# Patient Record
Sex: Female | Born: 1960 | Race: White | Hispanic: No | Marital: Married | Smoking: Current every day smoker
Health system: Southern US, Community
[De-identification: ages and names within clinical notes are randomized; demographics above are authoritative.]

## PROBLEM LIST (undated history)

## (undated) DIAGNOSIS — R091 Pleurisy: Secondary | ICD-10-CM

## (undated) DIAGNOSIS — Z87442 Personal history of urinary calculi: Secondary | ICD-10-CM

## (undated) DIAGNOSIS — Z98891 History of uterine scar from previous surgery: Secondary | ICD-10-CM

## (undated) DIAGNOSIS — G43909 Migraine, unspecified, not intractable, without status migrainosus: Secondary | ICD-10-CM

## (undated) DIAGNOSIS — F329 Major depressive disorder, single episode, unspecified: Secondary | ICD-10-CM

## (undated) DIAGNOSIS — F419 Anxiety disorder, unspecified: Secondary | ICD-10-CM

## (undated) DIAGNOSIS — F32A Depression, unspecified: Secondary | ICD-10-CM

## (undated) DIAGNOSIS — I10 Essential (primary) hypertension: Secondary | ICD-10-CM

## (undated) DIAGNOSIS — J40 Bronchitis, not specified as acute or chronic: Secondary | ICD-10-CM

## (undated) DIAGNOSIS — E7800 Pure hypercholesterolemia, unspecified: Secondary | ICD-10-CM

## (undated) DIAGNOSIS — Z9889 Other specified postprocedural states: Secondary | ICD-10-CM

## (undated) DIAGNOSIS — J189 Pneumonia, unspecified organism: Secondary | ICD-10-CM

## (undated) DIAGNOSIS — J309 Allergic rhinitis, unspecified: Secondary | ICD-10-CM

## (undated) DIAGNOSIS — E559 Vitamin D deficiency, unspecified: Secondary | ICD-10-CM

## (undated) DIAGNOSIS — R112 Nausea with vomiting, unspecified: Secondary | ICD-10-CM

## (undated) DIAGNOSIS — K635 Polyp of colon: Secondary | ICD-10-CM

## (undated) DIAGNOSIS — R002 Palpitations: Secondary | ICD-10-CM

## (undated) DIAGNOSIS — K219 Gastro-esophageal reflux disease without esophagitis: Secondary | ICD-10-CM

## (undated) DIAGNOSIS — K5792 Diverticulitis of intestine, part unspecified, without perforation or abscess without bleeding: Secondary | ICD-10-CM

## (undated) DIAGNOSIS — Z951 Presence of aortocoronary bypass graft: Secondary | ICD-10-CM

## (undated) DIAGNOSIS — Z8719 Personal history of other diseases of the digestive system: Secondary | ICD-10-CM

## (undated) HISTORY — DX: Allergic rhinitis, unspecified: J30.9

## (undated) HISTORY — PX: CORONARY ANGIOPLASTY: SHX604

## (undated) HISTORY — PX: ABDOMINAL HYSTERECTOMY: SHX81

## (undated) HISTORY — PX: CORONARY ARTERY BYPASS GRAFT: SHX141

## (undated) HISTORY — DX: Vitamin D deficiency, unspecified: E55.9

## (undated) HISTORY — DX: Polyp of colon: K63.5

## (undated) HISTORY — PX: TUBAL LIGATION: SHX77

## (undated) HISTORY — DX: Anxiety disorder, unspecified: F41.9

## (undated) HISTORY — PX: TONSILLECTOMY AND ADENOIDECTOMY: SUR1326

## (undated) HISTORY — DX: Migraine, unspecified, not intractable, without status migrainosus: G43.909

## (undated) HISTORY — PX: TOTAL ABDOMINAL HYSTERECTOMY: SHX209

## (undated) HISTORY — DX: Pneumonia, unspecified organism: J18.9

## (undated) HISTORY — DX: Pleurisy: R09.1

## (undated) HISTORY — PX: COLONOSCOPY: SHX174

## (undated) HISTORY — PX: BREAST BIOPSY: SHX20

---
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
## 1998-06-06 ENCOUNTER — Emergency Department (HOSPITAL_COMMUNITY): Admission: EM | Admit: 1998-06-06 | Discharge: 1998-06-07 | Payer: Self-pay | Admitting: Emergency Medicine

## 1998-06-06 ENCOUNTER — Encounter: Payer: Self-pay | Admitting: Emergency Medicine

## 1998-09-25 ENCOUNTER — Emergency Department (HOSPITAL_COMMUNITY): Admission: EM | Admit: 1998-09-25 | Discharge: 1998-09-25 | Payer: Self-pay | Admitting: Endocrinology

## 1998-09-28 ENCOUNTER — Emergency Department (HOSPITAL_COMMUNITY): Admission: EM | Admit: 1998-09-28 | Discharge: 1998-09-28 | Payer: Self-pay | Admitting: Emergency Medicine

## 1999-07-15 ENCOUNTER — Other Ambulatory Visit: Admission: RE | Admit: 1999-07-15 | Discharge: 1999-07-15 | Payer: Self-pay | Admitting: Obstetrics and Gynecology

## 1999-07-29 ENCOUNTER — Encounter (INDEPENDENT_AMBULATORY_CARE_PROVIDER_SITE_OTHER): Payer: Self-pay

## 1999-07-29 ENCOUNTER — Other Ambulatory Visit: Admission: RE | Admit: 1999-07-29 | Discharge: 1999-07-29 | Payer: Self-pay | Admitting: Obstetrics and Gynecology

## 1999-08-12 ENCOUNTER — Encounter: Admission: RE | Admit: 1999-08-12 | Discharge: 1999-08-12 | Payer: Self-pay | Admitting: Obstetrics

## 1999-09-14 ENCOUNTER — Encounter: Admission: RE | Admit: 1999-09-14 | Discharge: 1999-09-14 | Payer: Self-pay | Admitting: Obstetrics

## 1999-09-15 ENCOUNTER — Encounter: Payer: Self-pay | Admitting: Obstetrics

## 1999-09-15 ENCOUNTER — Ambulatory Visit (HOSPITAL_COMMUNITY): Admission: RE | Admit: 1999-09-15 | Discharge: 1999-09-15 | Payer: Self-pay | Admitting: Obstetrics

## 1999-09-27 ENCOUNTER — Encounter (INDEPENDENT_AMBULATORY_CARE_PROVIDER_SITE_OTHER): Payer: Self-pay

## 1999-09-27 ENCOUNTER — Ambulatory Visit (HOSPITAL_COMMUNITY): Admission: RE | Admit: 1999-09-27 | Discharge: 1999-09-27 | Payer: Self-pay | Admitting: Obstetrics

## 1999-10-13 ENCOUNTER — Inpatient Hospital Stay (HOSPITAL_COMMUNITY): Admission: AD | Admit: 1999-10-13 | Discharge: 1999-10-13 | Payer: Self-pay | Admitting: Obstetrics

## 1999-10-14 ENCOUNTER — Encounter: Admission: RE | Admit: 1999-10-14 | Discharge: 1999-10-14 | Payer: Self-pay | Admitting: Obstetrics

## 2001-03-24 ENCOUNTER — Emergency Department (HOSPITAL_COMMUNITY): Admission: EM | Admit: 2001-03-24 | Discharge: 2001-03-24 | Payer: Self-pay | Admitting: Emergency Medicine

## 2001-03-27 ENCOUNTER — Emergency Department (HOSPITAL_COMMUNITY): Admission: EM | Admit: 2001-03-27 | Discharge: 2001-03-27 | Payer: Self-pay | Admitting: Emergency Medicine

## 2001-04-05 ENCOUNTER — Encounter: Admission: RE | Admit: 2001-04-05 | Discharge: 2001-05-10 | Payer: Self-pay | Admitting: Family Medicine

## 2001-05-09 ENCOUNTER — Other Ambulatory Visit: Admission: RE | Admit: 2001-05-09 | Discharge: 2001-05-09 | Payer: Self-pay | Admitting: Family Medicine

## 2001-12-03 ENCOUNTER — Emergency Department (HOSPITAL_COMMUNITY): Admission: EM | Admit: 2001-12-03 | Discharge: 2001-12-03 | Payer: Self-pay | Admitting: *Deleted

## 2001-12-21 ENCOUNTER — Emergency Department (HOSPITAL_COMMUNITY): Admission: EM | Admit: 2001-12-21 | Discharge: 2001-12-21 | Payer: Self-pay | Admitting: Emergency Medicine

## 2002-09-24 ENCOUNTER — Other Ambulatory Visit: Admission: RE | Admit: 2002-09-24 | Discharge: 2002-09-24 | Payer: Self-pay | Admitting: Obstetrics and Gynecology

## 2002-10-07 ENCOUNTER — Encounter: Payer: Self-pay | Admitting: Obstetrics and Gynecology

## 2002-10-07 ENCOUNTER — Ambulatory Visit (HOSPITAL_COMMUNITY): Admission: RE | Admit: 2002-10-07 | Discharge: 2002-10-07 | Payer: Self-pay | Admitting: Obstetrics and Gynecology

## 2002-11-25 ENCOUNTER — Ambulatory Visit (HOSPITAL_BASED_OUTPATIENT_CLINIC_OR_DEPARTMENT_OTHER): Admission: RE | Admit: 2002-11-25 | Discharge: 2002-11-25 | Payer: Self-pay | Admitting: Obstetrics and Gynecology

## 2003-09-04 ENCOUNTER — Emergency Department (HOSPITAL_COMMUNITY): Admission: EM | Admit: 2003-09-04 | Discharge: 2003-09-04 | Payer: Self-pay | Admitting: Family Medicine

## 2003-11-16 ENCOUNTER — Emergency Department (HOSPITAL_COMMUNITY): Admission: EM | Admit: 2003-11-16 | Discharge: 2003-11-16 | Payer: Self-pay | Admitting: Internal Medicine

## 2003-12-25 ENCOUNTER — Emergency Department (HOSPITAL_COMMUNITY): Admission: EM | Admit: 2003-12-25 | Discharge: 2003-12-25 | Payer: Self-pay | Admitting: Family Medicine

## 2005-04-11 ENCOUNTER — Emergency Department (HOSPITAL_COMMUNITY): Admission: EM | Admit: 2005-04-11 | Discharge: 2005-04-12 | Payer: Self-pay | Admitting: Emergency Medicine

## 2006-08-30 ENCOUNTER — Ambulatory Visit (HOSPITAL_COMMUNITY): Admission: RE | Admit: 2006-08-30 | Discharge: 2006-08-31 | Payer: Self-pay | Admitting: Obstetrics and Gynecology

## 2006-08-30 ENCOUNTER — Encounter (INDEPENDENT_AMBULATORY_CARE_PROVIDER_SITE_OTHER): Payer: Self-pay | Admitting: Obstetrics and Gynecology

## 2007-01-17 ENCOUNTER — Ambulatory Visit (HOSPITAL_COMMUNITY): Admission: RE | Admit: 2007-01-17 | Discharge: 2007-01-17 | Payer: Self-pay | Admitting: Obstetrics and Gynecology

## 2007-09-25 ENCOUNTER — Emergency Department (HOSPITAL_COMMUNITY): Admission: EM | Admit: 2007-09-25 | Discharge: 2007-09-25 | Payer: Self-pay | Admitting: Emergency Medicine

## 2007-10-29 ENCOUNTER — Ambulatory Visit: Payer: Self-pay | Admitting: Cardiology

## 2010-08-10 NOTE — Assessment & Plan Note (Signed)
[facility]                            CARDIOLOGY OFFICE NOTE   NAME:Regina Perry, Regina Perry                        MRN:          [id number]  DATE:10/29/2007                            DOB:          [birth year]    I was asked by Dr. Iantha Fallen to consult on [name] with chest  discomfort.   [facility] September 25, 2007, with some sharp  stabbing pains in her chest.  She then had trouble breathing and got  very anxious.   She has multiple risk factors for coronary artery disease even though  she is only 50 years of age.  She smokes about a pack of cigarettes a  day and also has a family history with the mother having an MI in her  early 50s.  Her mother was a smoker, but not a heavy smoker.  She did  have COPD, however.   She has been under a lot of stress with her mother dying and then also  her sister dying.  She says every time she tries to stop smoking,  something bad happens to her.   She suffer from headaches and she was about 50 years of age.  She has  had some chest pain and pressure off and on with  stress and tension.   PAST MEDICAL HISTORY:  She has no history of dye reactions.  She is not  allergic to medications.  She does drink alcohol.  She does not list how  much.  She does not use excessive caffeine.   PAST SURGICAL HISTORY:  She had LEA procedure in 2001.   FAMILY HISTORY:  As noted above.   SOCIAL HISTORY:  She is a Sport and exercise psychologist.  She is married, has 2  children.   REVIEW OF SYSTEMS:  She has a history of some anemia, history of an  ulcer, menstrual dysfunction, urinary tract problems, anxiety, and  depression.  Otherwise her history, review of systems are negative other  than HPI.   PHYSICAL EXAMINATION:  GENERAL:  Very pleasant lady in no acute  distress.  She looks pretty tired and stressed.  She is a bit anxious.  VITAL SIGNS:  Her blood pressure is 122/82, her pulse is 64 and regular.  Her EKG is completely normal except for an RSR prime in V1 and V2,  mostly V2.  She is 5 feet and 2-1/2, and weighs 135 35 pounds.  HEENT:  Normocephalic and atraumatic.  PERRLA.  Extraocular movements  intact.  Sclerae are slightly injected.  Facial symmetry is normal.  Carotid upstrokes are equal bilaterally without bruits.  No JVD.  Thyroid is not enlarged.  Trachea is midline.  LUNGS:  Reveal some expiratory and expiratory rhonchi.  HEART:  Reveals normal S1 and S2.  No gallop.  ABDOMEN:  Soft.  Good bowel sounds.  No midline bruit.  There is no  hepatomegaly.  EXTREMITIES:  Without cyanosis, clubbing, or edema.  Pulses are intact.  NEURO:  Intact.   I had a long talk with Ms. Gafford today.  She clearly is at moderate  risk of having an acute coronary syndrome at a premature age.  I have  encouraged her to stop smoking.  We will send her to the No Smoking  Clinic at [facility].  She has tried Chantix in the past, which  she said she could not tolerate.  It made her dizzy and she had  headaches.   In addition, we will obtain an exercise rest/stress Myoview to rule out  obstructive coronary disease.  We will check fasting lipids on that day  as well as a CMP.  She probably could benefit from a statin with a  moderate to high risk category.     Thomas C. Daleen Squibb, MD, [facility]  Electronically Signed    TCW/MedQ  DD: 10/29/2007  DT: 10/30/2007  Job #: [id number]   cc:   Jacquelyne Balint, MD

## 2010-08-13 NOTE — Discharge Summary (Signed)
NAMEOUMOU, SMEAD                 ACCOUNT NO.:  [id number]   MEDICAL RECORD NO.:  [id number]          PATIENT TYPE:  OIB   LOCATION:  9320                          FACILITY:  [facility]   PHYSICIAN:  Lenoard Aden, M.D.DATE OF BIRTH:  [birth year]   DATE OF ADMISSION:  08/30/2006  DATE OF DISCHARGE:  08/31/2006                               DISCHARGE SUMMARY   PREOPERATIVE DIAGNOSIS:  Dysmenorrhea and menorrhagia.   POSTOPERATIVE DIAGNOSIS:  Dysmenorrhea and menorrhagia, plus enterocele.   PROCEDURE:  Total laparoscopic hysterectomy and McCall culdoplasty.   The patient was admitted and underwent uncomplicated procedures as  described above. She tolerated a regular diet well. Hemoglobin and  hematocrit stable. Ambulated without difficulty. Was discharged to home  day 1 with Percocet #30 to take for pain. She is to follow up in 4  weeks. Discharge teaching done.      Lenoard Aden, M.D.  Electronically Signed     RJT/MEDQ  D:  08/31/2006  T:  08/31/2006  Job:  [id number]

## 2010-08-13 NOTE — Op Note (Signed)
NAMEMARGAURITE, SALIDO NO.:  [id number]   MEDICAL RECORD NO.:  [id number]          PATIENT TYPE:  AMB   LOCATION:  SDC                           FACILITY:  [facility]   PHYSICIAN:  Lenoard Aden, M.D.DATE OF BIRTH:  26-Apr-1960   DATE OF PROCEDURE:  08/30/2006  DATE OF DISCHARGE:                               OPERATIVE REPORT   PREOPERATIVE DIAGNOSIS:  Dysmenorrhea, menorrhagia.  History of failed  endometrial ablation, previous C-section x2.   POSTOPERATIVE DIAGNOSIS:  Dysmenorrhea, menorrhagia.  History of failed  endometrial ablation, previous C-section x2.  Plus enterocele.   PROCEDURE:  Diagnostic laparoscopy, total laparoscopic hysterectomy,  McCall culdoplasty done through the laparoscope.   SURGEON:  Lenoard Aden, M.D.   ASSISTANT:  Cordelia Pen A. Rosalio Macadamia, M.D.   ANESTHESIA:  General   ESTIMATED BLOOD LOSS:  100 mL.   COMPLICATIONS:  None.  The patient to recovery in good condition.   DRAINS:  Foley.   COUNTS:  Correct.   SPECIMEN:  Uterus and cervix.   DESCRIPTION OF PROCEDURE:  After being apprised of risks of anesthesia,  infection, bleeding, injury to abdominal organs with need for repair,  delayed versus immediate complications to include bowel and bladder  injury.  The patient brought to the operating room.  She was  administered a general anesthetic without complication.  She is prepped  and draped in sterile fashion.  Foley catheter placed. Rumi retractor  was placed per vagina after putting a stay suture on the cervix and  seated the Rumi in the proper fashion, unable to inflate the internal  Rumi balloon due to the endometrial ablation, occluder balloon is  insufflated without difficulty.  Attention is turned the abdominal  portion procedure whereby an infraumbilical incision made scalpel.  Veress needle placed opening pressure -2 noted. 3 liters CO2 insufflated  without difficulty.  Trocar placed.  Atraumatic trocar entry  visualized.  Pictures taken.  Normal liver, gallbladder bed, appendix not visualized,  normal previously surgically divided tubes, normal ovaries. Anterior  posterior cul-de-sac are clear except for some dense adhesions of the  bladder flap to the anterior cul-de-sac. At this time the 5 mm trocar  sites are placed bilaterally in the lower bilateral lower quadrants the  midaxillary line under direct visualization and transillumination,  avoiding any vascular injury. At this time the round ligament is grasped  and ligated using the gyrus on the left. Same procedure is done on the  right. The tubo-ovarian round ligament complexes are cauterized and  bladder flap was developed sharply. Using the Endo shears and the gyrus.  At this time the uterine vessels were skeletonized bilaterally and  divided using the gyrus. Good blanching of the uterus noted. Anterior  cul-de-sac entry was made using the spatula. Posterior cul-de-sac entry  was made using a spatula as well. Specimen is completely detached and  pulled down into the vagina maintaining insufflation of the vaginal cuff  could be visualized.  Both ovaries appear normal.  Entire procedure both  ureters were visualized and both peristalsing normally through the  course entire procedure.  At this time the vagina is closed side-to-side  using a 0 Vicryl interrupted figure-of-eight suture. McCall culdoplasty  sutures placed after identification of the enterocele placating the  posterior portion of the cuff and the uterosacrals in the midline.  Irrigation was accomplished.  Good hemostasis noted.  All instruments  were removed under direct visualization.  CO2 was released.  Positive  pressure applied. Incision is closed using 0 Vicryl, 4-0 Vicryl and  Dermabond.  The uterus and cervix removed from the vagina.  The patient  tolerates procedure well and is transferred to recovery in good  condition.      Lenoard Aden, M.D.  Electronically  Signed     RJT/MEDQ  D:  08/30/2006  T:  08/30/2006  Job:  [id number]

## 2010-08-13 NOTE — Op Note (Signed)
[facility]  Patient:    Regina Perry, MARXEN                        MRN: [id number] Proc. Date: 09/27/99 Adm. Date:  [id number] Attending:  Tammi Sou                           Operative Report  PREOPERATIVE DIAGNOSIS:       CIN-3.  POSTOPERATIVE DIAGNOSIS:      CIN-3.  OPERATION:                    LEEP.  SURGEON:                      Charles A. Clearance Coots, M.D.  ASSISTANT:  ANESTHESIA:                   MAC with paracervical block.  ESTIMATED BLOOD LOSS:         Negligible.  COMPLICATIONS:                None.  SPECIMEN:                     Conization of cervix.  DESCRIPTION OF PROCEDURE:     The patient was brought to the operating room and  after satisfactory IV sedation in the dorsal lithotomy position with the legs in Allen stirrups, the vagina was prepped and draped in the usual sterile fashion. A sterile LEEP speculum was placed in the vaginal vault and the cervix was isolated. Paracervical block of 1% Xylocaine with 1:100,000 solution of epinephrine was injected in the cervical stroma throughout.  A 20 x 12 mm loupe electrode was then used to obtain a conization of the cervix with the wattage of 75 watts cutting nd 50 watts coag.  The cervical conization bed was then cauterized with a balltip electrode and the edges were feathered with the balltip electrode to prevent cervical stenosis.  The specimen obtained was submitted to pathology for evaluation.  Monsels solution and Aminocerv cream was then applied to the conization bed for hemostasis and to promote healing respectively.  There was no active bleeding noted at the conclusion of the procedure.  All instruments were  retired.  The patient tolerated the procedure well and was transported to the recovery room in satisfactory condition. DD:  09/27/99 TD:  09/27/99 Job: [id number]

## 2010-08-13 NOTE — Op Note (Signed)
   NAME:  Regina Perry, Regina Perry                           ACCOUNT NO.:  [id number]   MEDICAL RECORD NO.:  [id number]                   PATIENT TYPE:  AMB   LOCATION:  NESC                                 FACILITY:  [facility]   PHYSICIAN:  Cynthia P. Romine, M.D.             DATE OF BIRTH:  [birth year]   DATE OF PROCEDURE:  11/25/2002  DATE OF DISCHARGE:                                 OPERATIVE REPORT   PREOPERATIVE DIAGNOSIS:  Menorrhagia.   POSTOPERATIVE DIAGNOSIS:  Menorrhagia.   PROCEDURE:  Endometrial ablation using hydrofirm ablation.   SURGEON:  Cynthia P. Romine, M.D.   ANESTHESIA:  General by LMA.   ESTIMATED BLOOD LOSS:  Minimal.   COMPLICATIONS:  None.   DESCRIPTION OF PROCEDURE:  The patient was taken to the operating room and  after the induction of adequate general anesthesia was placed in the dorsal  lithotomy position and prepped and draped in the usual fashion. The bladder  was drained with a red rubber catheter. The cervix was grasped on its  anterior lip with a single tooth tenaculum. The cervix was dilated to a #25  Shawnie Pons, the ablation hysteroscope was introduced into the endometrial cavity  and its proper placement noted. Photographic documentation was taken.  Endometrial ablation was then carried out according to the [facility]  specifications without difficulty. Upon completion of the ablation,  photographic documentation was taken and the scope was withdrawn. The  tenaculum was removed from the cervix, Monsel solution was applied to  control bleeding from the tenaculum site and the procedure was terminated.  The patient tolerated the procedure well and went in satisfactory condition  to post anesthesia recovery.                                               Cynthia P. Romine, M.D.    CPR/MEDQ  D:  11/25/2002  T:  11/25/2002  Job:  [id number]

## 2010-12-23 LAB — CBC
HCT: 45.2
Hemoglobin: 15.4 — ABNORMAL HIGH
MCHC: 34
MCV: 91.4
Platelets: 213
RBC: 4.95
RDW: 13.3
WBC: 12 — ABNORMAL HIGH

## 2010-12-23 LAB — DIFFERENTIAL
Basophils Absolute: 0.1
Basophils Relative: 1
Eosinophils Absolute: 0.1
Eosinophils Relative: 1
Lymphocytes Relative: 20
Lymphs Abs: 2.4
Monocytes Absolute: 0.5
Monocytes Relative: 4
Neutro Abs: 8.8 — ABNORMAL HIGH
Neutrophils Relative %: 74

## 2010-12-23 LAB — COMPREHENSIVE METABOLIC PANEL
ALT: 15
AST: 17
Albumin: 4.1
Alkaline Phosphatase: 61
BUN: 11
CO2: 23
Calcium: 9.3
Chloride: 105
Creatinine, Ser: 0.78
GFR calc Af Amer: >60
GFR calc non Af Amer: >60
Glucose, Bld: 101 — ABNORMAL HIGH
Potassium: 4
Sodium: 138
Total Bilirubin: 1.1
Total Protein: 7.1

## 2010-12-23 LAB — POCT I-STAT, CHEM 8
BUN: 13
Calcium, Ion: 1.11 — ABNORMAL LOW
Chloride: 108
Creatinine, Ser: 1
Glucose, Bld: 99
HCT: 48 — ABNORMAL HIGH
Hemoglobin: 16.3 — ABNORMAL HIGH
Potassium: 4.5
Sodium: 139
TCO2: 24

## 2010-12-23 LAB — URINALYSIS, ROUTINE W REFLEX MICROSCOPIC
Bilirubin Urine: NEGATIVE
Glucose, UA: NEGATIVE
Hgb urine dipstick: NEGATIVE
Ketones, ur: NEGATIVE
Nitrite: NEGATIVE
Protein, ur: NEGATIVE
Specific Gravity, Urine: 1.007
Urobilinogen, UA: 0.2
pH: 7.5

## 2010-12-23 LAB — POCT CARDIAC MARKERS
CKMB, poc: <1 — ABNORMAL LOW
CKMB, poc: <1 — ABNORMAL LOW
Myoglobin, poc: 37.1
Myoglobin, poc: 45.5
Operator id: 146091
Operator id: 257131
Troponin i, poc: <0.05
Troponin i, poc: <0.05

## 2011-01-05 ENCOUNTER — Other Ambulatory Visit (HOSPITAL_COMMUNITY): Payer: Self-pay | Admitting: Family Medicine

## 2011-01-05 DIAGNOSIS — Z1231 Encounter for screening mammogram for malignant neoplasm of breast: Secondary | ICD-10-CM

## 2011-01-13 LAB — BASIC METABOLIC PANEL
BUN: 12
CO2: 20
Calcium: 9.3
Chloride: 108
Creatinine, Ser: 0.78
GFR calc Af Amer: >60
GFR calc non Af Amer: >60
Glucose, Bld: 87
Potassium: 3.5
Sodium: 140

## 2011-01-13 LAB — CBC
HCT: 33.4 — ABNORMAL LOW
HCT: 42
Hemoglobin: 11.7 — ABNORMAL LOW
Hemoglobin: 14.2
MCHC: 33.7
MCHC: 35.1
MCV: 91.7
MCV: 92.2
Platelets: 180
Platelets: 220
RBC: 3.62 — ABNORMAL LOW
RBC: 4.59
RDW: 12.9
RDW: 13.1
WBC: 11.6 — ABNORMAL HIGH
WBC: 13.8 — ABNORMAL HIGH

## 2011-01-13 LAB — SAMPLE TO BLOOD BANK

## 2011-01-13 LAB — HCG, SERUM, QUALITATIVE: Preg, Serum: NEGATIVE

## 2011-01-18 ENCOUNTER — Ambulatory Visit (HOSPITAL_COMMUNITY)
Admission: RE | Admit: 2011-01-18 | Discharge: 2011-01-18 | Disposition: A | Discharge status: Home / Self Care | Payer: 59 | Source: Ambulatory Visit | Attending: Family Medicine | Admitting: Family Medicine

## 2011-01-18 DIAGNOSIS — Z1231 Encounter for screening mammogram for malignant neoplasm of breast: Secondary | ICD-10-CM | POA: Insufficient documentation

## 2012-02-07 ENCOUNTER — Ambulatory Visit (HOSPITAL_COMMUNITY)
Admission: RE | Admit: 2012-02-07 | Discharge: 2012-02-07 | Disposition: A | Discharge status: Home / Self Care | Payer: 59 | Source: Ambulatory Visit | Attending: Family Medicine | Admitting: Family Medicine

## 2012-02-07 ENCOUNTER — Other Ambulatory Visit (HOSPITAL_COMMUNITY): Payer: Self-pay | Admitting: Family Medicine

## 2012-02-07 DIAGNOSIS — R05 Cough: Secondary | ICD-10-CM | POA: Insufficient documentation

## 2012-02-07 DIAGNOSIS — J189 Pneumonia, unspecified organism: Secondary | ICD-10-CM

## 2012-02-07 DIAGNOSIS — R059 Cough, unspecified: Secondary | ICD-10-CM | POA: Insufficient documentation

## 2012-02-07 DIAGNOSIS — J3489 Other specified disorders of nose and nasal sinuses: Secondary | ICD-10-CM | POA: Insufficient documentation

## 2012-04-03 ENCOUNTER — Ambulatory Visit (HOSPITAL_COMMUNITY)
Admission: RE | Admit: 2012-04-03 | Discharge: 2012-04-03 | Disposition: A | Discharge status: Home / Self Care | Payer: 59 | Source: Ambulatory Visit | Attending: Family Medicine | Admitting: Family Medicine

## 2012-04-03 ENCOUNTER — Other Ambulatory Visit (HOSPITAL_COMMUNITY): Payer: Self-pay | Admitting: Family Medicine

## 2012-04-03 DIAGNOSIS — J40 Bronchitis, not specified as acute or chronic: Secondary | ICD-10-CM

## 2012-04-09 ENCOUNTER — Other Ambulatory Visit: Payer: Self-pay

## 2012-04-09 ENCOUNTER — Encounter (HOSPITAL_COMMUNITY): Payer: Self-pay | Admitting: Emergency Medicine

## 2012-04-09 ENCOUNTER — Observation Stay (HOSPITAL_COMMUNITY)
Admission: EM | Admit: 2012-04-09 | Discharge: 2012-04-10 | Disposition: A | Discharge status: Home / Self Care | Payer: 59 | Attending: Internal Medicine | Admitting: Internal Medicine

## 2012-04-09 ENCOUNTER — Emergency Department (HOSPITAL_COMMUNITY): Payer: 59

## 2012-04-09 DIAGNOSIS — Z23 Encounter for immunization: Secondary | ICD-10-CM | POA: Insufficient documentation

## 2012-04-09 DIAGNOSIS — I10 Essential (primary) hypertension: Secondary | ICD-10-CM | POA: Diagnosis present

## 2012-04-09 DIAGNOSIS — R079 Chest pain, unspecified: Principal | ICD-10-CM | POA: Diagnosis present

## 2012-04-09 DIAGNOSIS — Z720 Tobacco use: Secondary | ICD-10-CM | POA: Diagnosis present

## 2012-04-09 DIAGNOSIS — F17200 Nicotine dependence, unspecified, uncomplicated: Secondary | ICD-10-CM | POA: Insufficient documentation

## 2012-04-09 DIAGNOSIS — E785 Hyperlipidemia, unspecified: Secondary | ICD-10-CM | POA: Diagnosis present

## 2012-04-09 HISTORY — DX: Essential (primary) hypertension: I10

## 2012-04-09 HISTORY — DX: Bronchitis, not specified as acute or chronic: J40

## 2012-04-09 HISTORY — DX: Pure hypercholesterolemia, unspecified: E78.00

## 2012-04-09 HISTORY — DX: History of uterine scar from previous surgery: Z98.891

## 2012-04-09 LAB — CBC WITH DIFFERENTIAL/PLATELET
Basophils Absolute: 0 10*3/uL (ref 0.0–0.1)
Basophils Relative: 0 % (ref 0–1)
Eosinophils Absolute: 0.1 10*3/uL (ref 0.0–0.7)
Eosinophils Relative: 1 % (ref 0–5)
HCT: 40.5 % (ref 36.0–46.0)
Hemoglobin: 13.4 g/dL (ref 12.0–15.0)
Lymphocytes Relative: 30 % (ref 12–46)
Lymphs Abs: 2.5 10*3/uL (ref 0.7–4.0)
MCH: 29.3 pg (ref 26.0–34.0)
MCHC: 33.1 g/dL (ref 30.0–36.0)
MCV: 88.6 fL (ref 78.0–100.0)
Monocytes Absolute: 0.5 10*3/uL (ref 0.1–1.0)
Monocytes Relative: 6 % (ref 3–12)
Neutro Abs: 5.2 10*3/uL (ref 1.7–7.7)
Neutrophils Relative %: 63 % (ref 43–77)
Platelets: 191 10*3/uL (ref 150–400)
RBC: 4.57 MIL/uL (ref 3.87–5.11)
RDW: 12.8 % (ref 11.5–15.5)
WBC: 8.3 10*3/uL (ref 4.0–10.5)

## 2012-04-09 LAB — BASIC METABOLIC PANEL
BUN: 17 mg/dL (ref 6–23)
CO2: 23 mEq/L (ref 19–32)
Calcium: 9 mg/dL (ref 8.4–10.5)
Chloride: 106 mEq/L (ref 96–112)
Creatinine, Ser: 0.67 mg/dL (ref 0.50–1.10)
GFR calc Af Amer: >90 mL/min (ref >90)
GFR calc non Af Amer: >90 mL/min (ref >90)
Glucose, Bld: 90 mg/dL (ref 70–99)
Potassium: 3.9 mEq/L (ref 3.5–5.1)
Sodium: 142 mEq/L (ref 135–145)

## 2012-04-09 LAB — TROPONIN I
Troponin I: <0.3 ng/mL (ref <0.30)
Troponin I: <0.3 ng/mL (ref <0.30)

## 2012-04-09 MED ORDER — MINOCYCLINE HCL 50 MG PO CAPS
50.0000 mg | ORAL_CAPSULE | Freq: Two times a day (BID) | ORAL | Status: DC
  Administered 2012-04-09 – 2012-04-10 (×2): 50 mg via ORAL
  Filled 2012-04-09 (×3): qty 1

## 2012-04-09 MED ORDER — IPRATROPIUM BROMIDE 0.02 % IN SOLN
0.5000 mg | Freq: Once | RESPIRATORY_TRACT | Status: AC
  Administered 2012-04-09: 0.5 mg via RESPIRATORY_TRACT
  Filled 2012-04-09: qty 2.5

## 2012-04-09 MED ORDER — NITROGLYCERIN 0.4 MG SL SUBL: 0.4000 mg | SUBLINGUAL_TABLET | SUBLINGUAL | Status: DC | PRN

## 2012-04-09 MED ORDER — ALBUTEROL SULFATE HFA 108 (90 BASE) MCG/ACT IN AERS: 1.0000 | INHALATION_SPRAY | Freq: Four times a day (QID) | RESPIRATORY_TRACT | Status: DC | PRN

## 2012-04-09 MED ORDER — PNEUMOCOCCAL VAC POLYVALENT 25 MCG/0.5ML IJ INJ
0.5000 mL | INJECTION | INTRAMUSCULAR | Status: AC
  Administered 2012-04-10: 0.5 mL via INTRAMUSCULAR
  Filled 2012-04-09: qty 0.5

## 2012-04-09 MED ORDER — SODIUM CHLORIDE 0.9 % IJ SOLN: 3.0000 mL | Freq: Two times a day (BID) | INTRAMUSCULAR | Status: DC

## 2012-04-09 MED ORDER — SODIUM CHLORIDE 0.9 % IJ SOLN: 3.0000 mL | INTRAMUSCULAR | Status: DC | PRN

## 2012-04-09 MED ORDER — MORPHINE SULFATE 2 MG/ML IJ SOLN: 2.0000 mg | INTRAMUSCULAR | Status: DC | PRN

## 2012-04-09 MED ORDER — ASPIRIN 325 MG PO TABS
325.0000 mg | ORAL_TABLET | Freq: Every day | ORAL | Status: DC
  Administered 2012-04-09: 325 mg via ORAL
  Filled 2012-04-09: qty 1

## 2012-04-09 MED ORDER — ALBUTEROL SULFATE (5 MG/ML) 0.5% IN NEBU
5.0000 mg | INHALATION_SOLUTION | Freq: Once | RESPIRATORY_TRACT | Status: AC
  Administered 2012-04-09: 5 mg via RESPIRATORY_TRACT
  Filled 2012-04-09: qty 1

## 2012-04-09 MED ORDER — METOPROLOL TARTRATE 25 MG PO TABS
25.0000 mg | ORAL_TABLET | Freq: Two times a day (BID) | ORAL | Status: DC
  Administered 2012-04-09 – 2012-04-10 (×2): 25 mg via ORAL
  Filled 2012-04-09 (×3): qty 1

## 2012-04-09 MED ORDER — MORPHINE SULFATE 4 MG/ML IJ SOLN
4.0000 mg | Freq: Once | INTRAMUSCULAR | Status: AC
  Administered 2012-04-09: 4 mg via INTRAVENOUS
  Filled 2012-04-09: qty 1

## 2012-04-09 MED ORDER — SIMVASTATIN 10 MG PO TABS
10.0000 mg | ORAL_TABLET | Freq: Every day | ORAL | Status: DC
  Filled 2012-04-09: qty 1

## 2012-04-09 MED ORDER — SUMATRIPTAN SUCCINATE 100 MG PO TABS
100.0000 mg | ORAL_TABLET | ORAL | Status: DC | PRN
  Administered 2012-04-10 (×2): 100 mg via ORAL
  Filled 2012-04-09 (×3): qty 1

## 2012-04-09 MED ORDER — ENOXAPARIN SODIUM 40 MG/0.4ML ~~LOC~~ SOLN
40.0000 mg | Freq: Every day | SUBCUTANEOUS | Status: DC
  Administered 2012-04-09: 40 mg via SUBCUTANEOUS
  Filled 2012-04-09 (×2): qty 0.4

## 2012-04-09 MED ORDER — SODIUM CHLORIDE 0.9 % IV SOLN: 250.0000 mL | INTRAVENOUS | Status: DC | PRN

## 2012-04-09 MED ORDER — SODIUM CHLORIDE 0.9 % IJ SOLN
3.0000 mL | Freq: Two times a day (BID) | INTRAMUSCULAR | Status: DC
  Administered 2012-04-09: 3 mL via INTRAVENOUS

## 2012-04-09 MED ORDER — PANTOPRAZOLE SODIUM 40 MG PO TBEC
40.0000 mg | DELAYED_RELEASE_TABLET | Freq: Every day | ORAL | Status: DC
  Administered 2012-04-10: 40 mg via ORAL

## 2012-04-09 MED ORDER — ASPIRIN EC 325 MG PO TBEC
325.0000 mg | DELAYED_RELEASE_TABLET | Freq: Every day | ORAL | Status: DC
  Administered 2012-04-10: 325 mg via ORAL
  Filled 2012-04-09: qty 1

## 2012-04-09 NOTE — ED Notes (Signed)
Per EMS - pt c/o CP, NSR on monitor. EMS started a 20G in hand. Pt took 324 of ASA prior to arrival. EMS gave 2 Nitro after nitro pt rates pain at 2/10 and locates pain at mid-epigatric area now. BP 166/90 HR 84. Pt has congested cough.

## 2012-04-09 NOTE — ED Provider Notes (Signed)
History     CSN: [id number]  Arrival date & time 04/09/12  1345   First MD Initiated Contact with Patient 04/09/12 1356      Chief Complaint  Patient presents with  . Chest Pain    (Consider location/radiation/quality/duration/timing/severity/associated sxs/prior treatment) HPI Pt reports a couple of weeks of intermittent mild aching chest pain and cough she attributed to bronchitis. She has been taking antibiotics at home for same. She had an episode this morning of severe midsternal chest pressure, associated with SOB and nausea, improved after taking ASA and resting. Symptoms returned while she was at work and EMS was called. She was given NTG and symptoms improved again. She has mild pain now. No prior history of same. No fever. She has HTN and she is a smoker. No PE risk factors.   Past Medical History  Diagnosis Date  . Hypertension   . Bronchitis   . Hypercholesteremia   . H/O cesarean section     Past Surgical History  Procedure Date  . Abdominal hysterectomy     partial    No family history on file.  History  Substance Use Topics  . Smoking status: Current Every Day Smoker -- 0.5 packs/day    Types: Cigarettes  . Smokeless tobacco: Not on file  . Alcohol Use: No    OB History    Grav Para Term Preterm Abortions TAB SAB Ect Mult Living                  Review of Systems All other systems reviewed and are negative except as noted in HPI.   Allergies  Review of patient's allergies indicates no known allergies.  Home Medications   Current Outpatient Rx  Name  Route  Sig  Dispense  Refill  . ACYCLOVIR 800 MG PO TABS   Oral   Take 800 mg by mouth 3 (three) times daily. Started 04-03-12 for viral bronchitis         . ALBUTEROL SULFATE HFA 108 (90 BASE) MCG/ACT IN AERS   Inhalation   Inhale 1 puff into the lungs every 6 (six) hours as needed. For shortness of breath         . ASPIRIN 325 MG PO TABS   Oral   Take 325 mg by mouth once.           Marlin Canary HEADACHE PO   Oral   Take 1-2 packets by mouth daily as needed. For headaches         . LOVASTATIN 20 MG PO TABS   Oral   Take 20 mg by mouth at bedtime.         Marland Kitchen MINOCYCLINE HCL 50 MG PO CAPS   Oral   Take 50 mg by mouth 2 (two) times daily. Started on 04-04-12 for bronchitis         . OMEPRAZOLE 20 MG PO CPDR   Oral   Take 20 mg by mouth daily.         . SUMATRIPTAN SUCCINATE 100 MG PO TABS   Oral   Take 100 mg by mouth every 2 (two) hours as needed. For migraines           BP 161/88  Pulse 77  Temp 97.8 F (36.6 C) (Oral)  Resp 18  SpO2 98%  Physical Exam  Nursing note and vitals reviewed. Constitutional: She is oriented to person, place, and time. She appears well-developed and well-nourished.  HENT:  Head: Normocephalic and  atraumatic.  Eyes: EOM are normal. Pupils are equal, round, and reactive to light.  Neck: Normal range of motion. Neck supple.  Cardiovascular: Normal rate, normal heart sounds and intact distal pulses.   Pulmonary/Chest: Effort normal and breath sounds normal.  Abdominal: Bowel sounds are normal. She exhibits no distension. There is no tenderness.  Musculoskeletal: Normal range of motion. She exhibits no edema and no tenderness.  Neurological: She is alert and oriented to person, place, and time. She has normal strength. No cranial nerve deficit or sensory deficit.  Skin: Skin is warm and dry. No rash noted.  Psychiatric: She has a normal mood and affect.    ED Course  Procedures (including critical care time)   Labs Reviewed  CBC WITH DIFFERENTIAL  BASIC METABOLIC PANEL  TROPONIN I   No results found.   No diagnosis found.    MDM   Date: 04/09/2012  Rate: 71  Rhythm: normal sinus rhythm  QRS Axis: normal  Intervals: normal  ST/T Wave abnormalities: normal  Conduction Disutrbances:none  Narrative Interpretation:   Old EKG Reviewed: none available  Care signed out to Dr. Effie Shy at the change of shift  pending lab results.         Annalynne Ibanez B. Bernette Mayers, MD 04/10/12 [phone]

## 2012-04-09 NOTE — ED Provider Notes (Signed)
Regina Perry is a 52 y.o. female who is here for evaluation of chest pain. She was seen initially by Dr. Gabriel Rung to evaluate her after her troponin returned. She was seen at 18:55. At this point, her chest pain is coming back. She feels like both the nitroglycerin, and morphine helped her pain. Now, the pain is 2/10, and waxing and waning. There are no associated symptoms. She's been treated for bronchitis, with an inhaler after treatment for pneumonia. She's not had her albuterol inhaler since being here.   Exam- alert, calm, appears older than stated age. Heart regular rate and rhythm. The murmur. Lungs with decreased air movement bilaterally, and scattered wheezes. Chest nontender to palpation. Abdomen soft, nontender. Legs nontender.  Nursing notes, applicable records and vitals reviewed.  Radiologic Images/Reports reviewed.   Medical decision making-TIMI 1. Cardiac risk profile. She has hypertension, hyperlipidemia, and a family history positive for MI in young females. She has previously had a cardiac stress test in 2009. The results are not available. Patient recalls that they were normal. Stress test was done by Dr. wall after he saw her in the office. She has not still see Dr. Daleen Squibb.   Plan: Admit to the hospital for rule out MI, and risk stratification cardiac procedure.    Flint Melter, MD 04/09/12 [phone]

## 2012-04-09 NOTE — ED Notes (Signed)
Pt reports she had been having this chest pressure/pain on and off for the past couple weeks and thought it was because she had pneumonia and been coughing a lot but felt today it was worse than normal.

## 2012-04-09 NOTE — H&P (Addendum)
[facility] History and Physical  Regina Perry:[id number] DOB: 1961-03-05 DOA: 04/09/2012  Referring physician: ED PCP: Aida Puffer, MD   Chief Complaint: Chest pain since 1 day  HPI:  52 year old female with history of hypertension, hyperlipidemia, heavy smoking, family history of MI who was in her usual state of health when she developed acute onset of substernal chest pain while sitting at home this morning. Patient informs the chest and to the crushing in nature 10 out of 10 in intensity lasted for almost 10 minutes. She took a dose of aspirin after which her pain mildly improved but recurred about an hour later. The pain radiated to her jaws. He called EMS and was brought to the ED. In the ED initial EKG and troponin was unremarkable. She was given a dose of morphine with her pain reduced to 4/10. She denies any shortness of breath , , palpitations, headache, blurry vision, fever, chills, abdominal pain, bowel or urinary symptoms. Denies any dizziness. She did have some nausea with the pain but denied any vomiting. She informs having bronchitis like symptoms one week back and was given a course of antibiotic by her PCP.   patient denies having similar chest pains in the past however was evaluated by [facility] cardiology in 2009  were increased risk factors for premature CAD with strong family history of her mother having MI in her late 18s. A stress test done as per the patient was negative at that time. Hospitalist called for admission to medical floor.  Review of Systems:  Constitutional: Denies fever, chills, diaphoresis, appetite change and fatigue.  HEENT: Denies photophobia, eye pain, redness, hearing loss, ear pain, congestion, sore throat, rhinorrhea, sneezing, mouth sores, trouble swallowing, neck pain, neck stiffness and tinnitus.   Respiratory: Denies SOB, DOE, cough, chest tightness,  and wheezing.   Cardiovascular: positive for substernal chest pain, denies palpitations  and leg swelling.  Gastrointestinal: nausea present, denies  vomiting, abdominal pain, diarrhea, constipation, blood in stool and abdominal distention.  Genitourinary: Denies dysuria, urgency, frequency, hematuria, flank pain and difficulty urinating.  Musculoskeletal: Denies myalgias, back pain, joint swelling, arthralgias and gait problem.  Skin: Denies pallor, rash and wound.  Neurological: Denies dizziness, seizures, syncope, weakness, light-headedness, numbness and headaches.  Hematological: Denies adenopathy. Easy bruising, personal or family bleeding history  Psychiatric/Behavioral: Denies suicidal ideation, mood changes, confusion, nervousness, sleep disturbance and agitation   Past Medical History  Diagnosis Date  . Hypertension   . Bronchitis   . Hypercholesteremia   . H/O cesarean section    Past Surgical History  Procedure Date  . Abdominal hysterectomy     partial   Social History:  reports that she has been smoking Cigarettes.  She has been smoking about .5 packs per day. She does not have any smokeless tobacco history on file. She reports that she does not drink alcohol or use illicit drugs.  No Known Allergies  No family history on file.  Prior to Admission medications   Medication Sig Start Date End Date Taking? Authorizing Provider  acyclovir (ZOVIRAX) 800 MG tablet Take 800 mg by mouth 3 (three) times daily. Started 04-03-12 for viral bronchitis   Yes Historical Provider, MD  albuterol (PROVENTIL HFA;VENTOLIN HFA) 108 (90 BASE) MCG/ACT inhaler Inhale 1 puff into the lungs every 6 (six) hours as needed. For shortness of breath   Yes Historical Provider, MD  aspirin 325 MG tablet Take 325 mg by mouth once.   Yes Historical Provider, MD  Aspirin-Acetaminophen-Caffeine (GOODY HEADACHE  PO) Take 1-2 packets by mouth daily as needed. For headaches   Yes Historical Provider, MD  lovastatin (MEVACOR) 20 MG tablet Take 20 mg by mouth at bedtime.   Yes Historical Provider, MD    minocycline (MINOCIN,DYNACIN) 50 MG capsule Take 50 mg by mouth 2 (two) times daily. Started on 04-04-12 for bronchitis   Yes Historical Provider, MD  omeprazole (PRILOSEC) 20 MG capsule Take 20 mg by mouth daily.   Yes Historical Provider, MD  SUMAtriptan (IMITREX) 100 MG tablet Take 100 mg by mouth every 2 (two) hours as needed. For migraines   Yes Historical Provider, MD    Physical Exam:  Filed Vitals:   04/09/12 1403 04/09/12 1415 04/09/12 1906 04/09/12 1915  BP:  161/88 151/84 149/66  Pulse:  77 67 64  Temp: 97.8 F (36.6 C)     TempSrc:      Resp:  18 20   SpO2:  98% 100% 100%    Constitutional: Vital signs reviewed.  Patient is a well-developed and well-nourished in no acute distress and cooperative with exam. Alert and oriented x3.  Head: Normocephalic and atraumatic Ear: TM normal bilaterally Mouth: no erythema or exudates, MMM Eyes: PERRL, EOMI, conjunctivae normal, No scleral icterus.  Neck: Supple, Trachea midline normal ROM, No JVD, mass, thyromegaly, or carotid bruit present.  Cardiovascular: RRR, S1 normal, S2 normal, no MRG, pulses symmetric and intact bilaterally. Chest pain not reproducible Pulmonary/Chest: CTAB, no wheezes, rales, or rhonchi Abdominal: Soft. Non-tender, non-distended, bowel sounds are normal, no masses, organomegaly, or guarding present.  GU: no CVA tenderness Musculoskeletal: No joint deformities, erythema, or stiffness, ROM full and no nontender Ext: no edema and no cyanosis, pulses palpable bilaterally (DP and PT) Hematology: no cervical, inginal, or axillary adenopathy.  Neurological: A&O x3, Strenght is normal and symmetric bilaterally, cranial nerve II-XII are grossly intact, no focal motor deficit, sensory intact to light touch bilaterally.  Skin: Warm, dry and intact. No rash, cyanosis, or clubbing.  Psychiatric: Normal mood and affect. speech and behavior is normal. Judgment and thought content normal. Cognition and memory are normal.    Labs on Admission:  Basic Metabolic Panel:  Lab 04/09/12 9562  NA 142  K 3.9  CL 106  CO2 23  GLUCOSE 90  BUN 17  CREATININE 0.67  CALCIUM 9.0  MG --  PHOS --   Liver Function Tests: No results found for this basename: AST:5,ALT:5,ALKPHOS:5,BILITOT:5,PROT:5,ALBUMIN:5 in the last 168 hours No results found for this basename: LIPASE:5,AMYLASE:5 in the last 168 hours No results found for this basename: AMMONIA:5 in the last 168 hours CBC:  Lab 04/09/12 1451  WBC 8.3  NEUTROABS 5.2  HGB 13.4  HCT 40.5  MCV 88.6  PLT 191   Cardiac Enzymes:  Lab 04/09/12 1451  CKTOTAL --  CKMB --  CKMBINDEX --  TROPONINI <0.30   BNP: No components found with this basename: POCBNP:5 CBG: No results found for this basename: GLUCAP:5 in the last 168 hours  Radiological Exams on Admission: Dg Chest 2 View  04/09/2012  *RADIOLOGY REPORT*  Clinical Data: Chest pain and shortness of breath.  CHEST - 2 VIEW  Comparison: 04/03/2012.  Findings: Trachea is midline.  Heart size normal.  Prominent epicardial fat is seen along the right heart border.  Lungs are clear.  No pleural fluid.  IMPRESSION: No acute findings.   Original Report Authenticated By: Leanna Battles, M.D.     EKG: normal Sinus rhythm,  no ST-T changes  Assessment/Plan Active  Problems:  Chest pain at rest  -Given her symptoms off chest pain at rest with underlying history of hypertension, hyperlipidemia active heavy smoking and positive family history of cardiac disease her symptoms are alarming for acute angina. Although her TIMI score is quite low.(1 only) Patient will be admitted to telemetry her under observation. Initial cardiac enzyme and EKG are unremarkable.  we will monitor serial cardiac enzymes and if ruled out for ACS will get a nuclear stress test in the morning. -I. will give her  a full dose of aspirin. Order when necessary sublingual nitroglycerin. Placed on oxygen the nasal cannula. IV morphine when necessary  for chest pain. I will also put her on low-dose metoprolol 25 mg twice a day. I will place her on simvastatin 10 mg daily. Check lipid panel and hemoglobin A1c in the morning.   Hypertension Continue home dose of minocycline. BP is mildly elevated at this time. Have added metoprolol as well.   Hyperlipidemia Patient is on lovastatin at home. Place her on simvastatin. Check lipid panel in the morning.   Tobacco abuse Patient counseled on smoking cessation  Diet: Cardiac DVT prophylaxis: Subcutaneous  Code Status: Full Family Communication: None at bedside Disposition Plan: If ruled out for ACS , Home after stress test tomorrow  Eddie North [facility] Pager [phone]  If 7PM-7AM, please contact night-coverage [email or web address] Password [facility] 04/09/2012, 8:13 PM  Total time spent 70 minutes

## 2012-04-09 NOTE — ED Notes (Signed)
Pt reports she was at work today and started to have some left sided chest pressure and became SOB. Pt reports she sat down and took some ASA. After she sat there for a few minutes she was ok so she went to the bank and when she got back to work she started to have the same pain again along with pain around her throat as if she couldn't breathe. Pt reports she got dizzy/lightheaded and nauseous. Pt reports all symptoms are intermittent.

## 2012-04-09 NOTE — ED Notes (Signed)
Pt reports she is still feeling SOB.

## 2012-04-10 ENCOUNTER — Encounter (HOSPITAL_COMMUNITY): Payer: Self-pay | Admitting: *Deleted

## 2012-04-10 ENCOUNTER — Observation Stay (HOSPITAL_COMMUNITY): Payer: 59

## 2012-04-10 DIAGNOSIS — R079 Chest pain, unspecified: Secondary | ICD-10-CM

## 2012-04-10 DIAGNOSIS — E785 Hyperlipidemia, unspecified: Secondary | ICD-10-CM

## 2012-04-10 LAB — LIPID PANEL
Cholesterol: 197 mg/dL (ref 0–200)
HDL: 42 mg/dL (ref >39)
LDL Cholesterol: 105 mg/dL — ABNORMAL HIGH (ref 0–99)
Total CHOL/HDL Ratio: 4.7 RATIO
Triglycerides: 251 mg/dL — ABNORMAL HIGH (ref <150)
VLDL: 50 mg/dL — ABNORMAL HIGH (ref 0–40)

## 2012-04-10 LAB — TROPONIN I
Troponin I: <0.3 ng/mL (ref <0.30)
Troponin I: <0.3 ng/mL (ref <0.30)

## 2012-04-10 LAB — HEMOGLOBIN A1C
Hgb A1c MFr Bld: 5.6 % (ref <5.7)
Mean Plasma Glucose: 114 mg/dL (ref <117)

## 2012-04-10 MED ORDER — ACYCLOVIR 800 MG PO TABS
800.0000 mg | ORAL_TABLET | Freq: Three times a day (TID) | ORAL | Status: DC
  Administered 2012-04-10: 800 mg via ORAL
  Filled 2012-04-10 (×3): qty 1

## 2012-04-10 MED ORDER — METOPROLOL TARTRATE 25 MG PO TABS: 25.0000 mg | ORAL_TABLET | Freq: Two times a day (BID) | ORAL | Status: DC

## 2012-04-10 MED ORDER — TECHNETIUM TC 99M SESTAMIBI GENERIC - CARDIOLITE
10.0000 | Freq: Once | INTRAVENOUS | Status: AC | PRN
  Administered 2012-04-10: 10 via INTRAVENOUS

## 2012-04-10 MED ORDER — TECHNETIUM TC 99M SESTAMIBI GENERIC - CARDIOLITE
30.0000 | Freq: Once | INTRAVENOUS | Status: AC | PRN
  Administered 2012-04-10: 30 via INTRAVENOUS

## 2012-04-10 NOTE — Progress Notes (Signed)
Utilization review completed.  

## 2012-04-10 NOTE — Progress Notes (Signed)
GXT Myoview performed. Results pending.

## 2012-04-10 NOTE — Discharge Summary (Signed)
Physician Discharge Summary  Regina Perry ION:[id number] DOB: 1961-01-14 DOA: 04/09/2012  PCP: Aida Puffer, MD  Admit date: 04/09/2012 Discharge date: 04/10/2012  Time spent: 35 minutes  Recommendations for Outpatient Follow-up: Stop smoking  Discharge Diagnoses:  Active Problems:  Chest pain at rest  Hypertension  Hyperlipidemia  Tobacco abuse   Discharge Condition: improved  Diet recommendation: diabetic  Filed Weights   04/09/12 2150 04/10/12 0430  Weight: 59.104 kg (130 lb 4.8 oz) 59 kg (130 lb 1.1 oz)    History of present illness:  52 year old female with history of hypertension, hyperlipidemia, heavy smoking, family history of MI who was in her usual state of health when she developed acute onset of substernal chest pain while sitting at home this morning. Patient informs the chest and to the crushing in nature 10 out of 10 in intensity lasted for almost 10 minutes. She took a dose of aspirin after which her pain mildly improved but recurred about an hour later. The pain radiated to her jaws. He called EMS and was brought to the ED. In the ED initial EKG and troponin was unremarkable. She was given a dose of morphine with her pain reduced to 4/10. She denies any shortness of breath , , palpitations, headache, blurry vision, fever, chills, abdominal pain, bowel or urinary symptoms. Denies any dizziness. She did have some nausea with the pain but denied any vomiting. She informs having bronchitis like symptoms one week back and was given a course of antibiotic by her PCP.  patient denies having similar chest pains in the past however was evaluated by [facility] cardiology in 2009 were increased risk factors for premature CAD with strong family history of her mother having MI in her late 72s. A stress test done as per the patient was negative at that time.  Hospitalist called for admission to medical floor.      Hospital Course:  Chest pain- CE negative, tele ok, stress test done  and is negative- encourage cessation of tobacco Tobacco abuse- not interested in quitting smoknig  Procedures:  Stress test  Consultations:  cards  Discharge Exam: Filed Vitals:   04/10/12 1105 04/10/12 1106 04/10/12 1110 04/10/12 1400  BP: 189/116 171/66 131/69 130/80  Pulse:    64  Temp:    98.6 F (37 C)  TempSrc:      Resp:    18  Height:      Weight:      SpO2:    96%    General: A+Ox3, NAd Cardiovascular: rrr Respiratory: clear  Discharge Instructions  Discharge Orders    Future Orders Please Complete By Expires   Diet - low sodium heart healthy      Increase activity slowly      Discharge instructions      Comments:   Stop smoking       Medication List     As of 04/10/2012  3:51 PM    TAKE these medications         acyclovir 800 MG tablet   Commonly known as: ZOVIRAX   Take 800 mg by mouth 3 (three) times daily. Started 04-03-12 for viral bronchitis      albuterol 108 (90 BASE) MCG/ACT inhaler   Commonly known as: PROVENTIL HFA;VENTOLIN HFA   Inhale 1 puff into the lungs every 6 (six) hours as needed. For shortness of breath      aspirin 325 MG tablet   Take 325 mg by mouth once.  GOODY HEADACHE PO   Take 1-2 packets by mouth daily as needed. For headaches      lovastatin 20 MG tablet   Commonly known as: MEVACOR   Take 20 mg by mouth at bedtime.      metoprolol tartrate 25 MG tablet   Commonly known as: LOPRESSOR   Take 1 tablet (25 mg total) by mouth 2 (two) times daily.      minocycline 50 MG capsule   Commonly known as: MINOCIN,DYNACIN   Take 50 mg by mouth 2 (two) times daily. Started on 04-04-12 for bronchitis      omeprazole 20 MG capsule   Commonly known as: PRILOSEC   Take 20 mg by mouth daily.      SUMAtriptan 100 MG tablet   Commonly known as: IMITREX   Take 100 mg by mouth every 2 (two) hours as needed. For migraines          The results of significant diagnostics from this hospitalization (including imaging,  microbiology, ancillary and laboratory) are listed below for reference.    Significant Diagnostic Studies: Dg Chest 2 View  04/09/2012  *RADIOLOGY REPORT*  Clinical Data: Chest pain and shortness of breath.  CHEST - 2 VIEW  Comparison: 04/03/2012.  Findings: Trachea is midline.  Heart size normal.  Prominent epicardial fat is seen along the right heart border.  Lungs are clear.  No pleural fluid.  IMPRESSION: No acute findings.   Original Report Authenticated By: Leanna Battles, M.D.    Dg Chest 2 View  04/03/2012  *RADIOLOGY REPORT*  Clinical Data: Increasing bronchitis like symptoms  CHEST - 2 VIEW  Comparison: Chest x-ray 02/07/2012  Findings: The lungs are well-aerated and free from pulmonary edema, focal airspace consolidation or pulmonary nodule. Slightly increased central airway thickening/peribronchial cuffing compared to prior.  Cardiac and mediastinal contours are within normal limits.  No pneumothorax, or pleural effusion. No acute osseous findings.  IMPRESSION:  Slightly increased central airway thickening/peribronchial cuffing as can be seen with both acute bronchitis as well as in underlying inflammatory conditions such as asthma.   Original Report Authenticated By: Malachy Moan, M.D.    Nm Myocar Multi W/spect W/wall Motion / Ef  04/10/2012  52 year old female complaining of chest pain.  The study is performed to exclude ischemia.  This is a same day rest stress protocol.  30 mCi of Myoview were used for the stress images and 10 mCi of Myoview were used for the rest images.  The patient exercised for 4 minutes and 29 seconds on the Bruce protocol.  Her heart rate increased to a maximum of 150 which was 87% of predicted maximum heart rate.  Her blood pressure increased to 171/66.  There was no chest pain during the study.  There were no ST changes.  There was an isolated couplet noted.  The study was terminated secondary to achieving target heart rate.  Scintigraphic results:  The images  were reconstructed in the short axis as well as the vertical and horizontal long axis.  The stress images reveal a small defect in the distal anterior wall/apex. When compared to the rest images there is no significant reversibility noted.  The gated ejection fraction was 62% and the wall motion was normal.  T I D - 0.95.  End-systolic volume 23 ml. End-diastolic volume 60 ml.  Final interpretation:  Normal stress Myoview with no chest pain, no diagnostic electrocardiographic changes, and the scintigraphic results show probable soft tissue attenuation but no ischemia.  The gated ejection fraction was 62% and the wall motion was normal.   Original Report Authenticated By: Olga Millers     Microbiology: No results found for this or any previous visit (from the past 240 hour(s)).   Labs: Basic Metabolic Panel:  Lab 04/09/12 1610  NA 142  K 3.9  CL 106  CO2 23  GLUCOSE 90  BUN 17  CREATININE 0.67  CALCIUM 9.0  MG --  PHOS --   Liver Function Tests: No results found for this basename: AST:5,ALT:5,ALKPHOS:5,BILITOT:5,PROT:5,ALBUMIN:5 in the last 168 hours No results found for this basename: LIPASE:5,AMYLASE:5 in the last 168 hours No results found for this basename: AMMONIA:5 in the last 168 hours CBC:  Lab 04/09/12 1451  WBC 8.3  NEUTROABS 5.2  HGB 13.4  HCT 40.5  MCV 88.6  PLT 191   Cardiac Enzymes:  Lab 04/10/12 0821 04/10/12 0414 04/09/12 2225 04/09/12 1451  CKTOTAL -- -- -- --  CKMB -- -- -- --  CKMBINDEX -- -- -- --  TROPONINI <0.30 <0.30 <0.30 <0.30   BNP: BNP (last 3 results) No results found for this basename: PROBNP:3 in the last 8760 hours CBG: No results found for this basename: GLUCAP:5 in the last 168 hours     Signed:  Marlin Canary  [facility] 04/10/2012, 3:51 PM

## 2012-04-12 ENCOUNTER — Encounter: Payer: Self-pay | Admitting: Gastroenterology

## 2012-04-25 ENCOUNTER — Ambulatory Visit: Payer: 59 | Admitting: Gastroenterology

## 2012-05-16 ENCOUNTER — Ambulatory Visit (INDEPENDENT_AMBULATORY_CARE_PROVIDER_SITE_OTHER): Payer: 59 | Admitting: Gastroenterology

## 2012-05-16 ENCOUNTER — Encounter: Payer: Self-pay | Admitting: Gastroenterology

## 2012-05-16 ENCOUNTER — Other Ambulatory Visit: Payer: Self-pay

## 2012-05-16 VITALS — BP 116/84 | HR 72 | Ht 63.0 in | Wt 134.0 lb

## 2012-05-16 DIAGNOSIS — R079 Chest pain, unspecified: Secondary | ICD-10-CM

## 2012-05-16 DIAGNOSIS — K219 Gastro-esophageal reflux disease without esophagitis: Secondary | ICD-10-CM

## 2012-05-16 DIAGNOSIS — K589 Irritable bowel syndrome without diarrhea: Secondary | ICD-10-CM

## 2012-05-16 MED ORDER — OMEPRAZOLE 20 MG PO CPDR: 20.0000 mg | DELAYED_RELEASE_CAPSULE | Freq: Two times a day (BID) | ORAL | Status: DC

## 2012-05-16 MED ORDER — PEG-KCL-NACL-NASULF-NA ASC-C 100 G PO SOLR: 1.0000 | Freq: Once | ORAL | Status: DC

## 2012-05-16 NOTE — Patient Instructions (Addendum)
You have been given a separate informational sheet regarding your tobacco use, the importance of quitting and local resources to help you quit.  You have been scheduled for an endoscopy with propofol. Please follow written instructions given to you at your visit today. If you use inhalers (even only as needed) or a CPAP machine, please bring them with you on the day of your procedure.  Increase your omeprazole to 20 mg one tablet by mouth twice daily. A new prescription has been sent to your pharmacy.   Thank you for choosing me and [facility].  Venita Lick. Pleas Koch., MD., Clementeen Graham  cc: Aida Puffer, MD

## 2012-05-16 NOTE — Progress Notes (Signed)
History of Present Illness: This is a 52 year old female recently hospitalized with chest pain. Patient describes a tightness in her chest and throat. These symptoms wax and wane. The pain and tightness increases with deep breathing and stress. Occasionally her symptoms are bothered by meals. She describes an epigastric and lower sternal fullness with meals but no true dysphasia. She states she had an esophageal ulcer diagnosed by EGD around 13 years ago at [facility] gastroenterology. She states she's been maintained on omeprazole since that time. She also has had a colonoscopy performed at [facility] gastroenterology about 5 or 6 years ago and relates a history of a colon polyp. Unfortunately I do not have records from [facility] available today. She has had long-term problems with alternating diarrhea and constipation with occasional episodes of small volume hematochezia. Her episodes of alternating diarrhea and constipation are occasionally associated with crampy abdominal pain. She relates a diagnosis of irritable bowel syndrome. She takes a stool softener frequently for constipation. Denies weight loss, change in stool caliber, melena, nausea, vomiting, dysphagia.  Review of Systems: Pertinent positive and negative review of systems were noted in the above HPI section. All other review of systems were otherwise negative.  Current Medications, Allergies, Past Medical History, Past Surgical History, Family History and Social History were reviewed in [employer] record.  Physical Exam: General: Well developed , well nourished, no acute distress Head: Normocephalic and atraumatic Eyes:  sclerae anicteric, EOMI Ears: Normal auditory acuity Mouth: No deformity or lesions Neck: Supple, no masses or thyromegaly Lungs: Clear throughout to auscultation Heart: Regular rate and rhythm; no murmurs, rubs or bruits Abdomen: Soft, non tender and non distended. No masses, hepatosplenomegaly or hernias  noted. Normal Bowel sounds Musculoskeletal: Symmetrical with no gross deformities  Skin: No lesions on visible extremities Pulses:  Normal pulses noted Extremities: No clubbing, cyanosis, edema or deformities noted Neurological: Alert oriented x 4, grossly nonfocal Cervical Nodes:  No significant cervical adenopathy Inguinal Nodes: No significant inguinal adenopathy Psychological:  Alert and cooperative. Normal mood and affect  Assessment and Recommendations:  1. Multifactorial chest pain. Chest pain is worsened by breathing and stress. She has chronic GERD and there may be a component of reflux related symptoms contributing to her chest pain. Increase omeprazole 20 mg twice daily. Intensify all antireflux measures. Schedule upper endoscopy. The risks, benefits, and alternatives to endoscopy with possible biopsy and possible dilation were discussed with the patient and they consent to proceed. Request records from [facility] gastroenterology. Further evaluation of her chest pain by her primary physician.  2. Presumed irritable bowel syndrome. Continue daily stool softener. Increase dietary fiber and water intake. Request records from [facility] gastroenterology.   3. History of colon polyps. Again, request records from [facility]

## 2012-05-24 ENCOUNTER — Ambulatory Visit (AMBULATORY_SURGERY_CENTER): Payer: 59 | Admitting: Gastroenterology

## 2012-05-24 ENCOUNTER — Encounter: Payer: Self-pay | Admitting: Gastroenterology

## 2012-05-24 VITALS — BP 130/74 | HR 64 | Temp 97.7°F | Resp 20 | Ht 63.0 in | Wt 134.0 lb

## 2012-05-24 DIAGNOSIS — K2960 Other gastritis without bleeding: Secondary | ICD-10-CM

## 2012-05-24 DIAGNOSIS — K219 Gastro-esophageal reflux disease without esophagitis: Secondary | ICD-10-CM

## 2012-05-24 DIAGNOSIS — K2990 Gastroduodenitis, unspecified, without bleeding: Secondary | ICD-10-CM

## 2012-05-24 DIAGNOSIS — K2970 Gastritis, unspecified, without bleeding: Secondary | ICD-10-CM

## 2012-05-24 DIAGNOSIS — R079 Chest pain, unspecified: Secondary | ICD-10-CM

## 2012-05-24 MED ORDER — SODIUM CHLORIDE 0.9 % IV SOLN: 500.0000 mL | INTRAVENOUS | Status: DC

## 2012-05-24 NOTE — Progress Notes (Signed)
PATIENT TO BATHROOM PRIOR TO DISCHARGE.

## 2012-05-24 NOTE — Op Note (Signed)
[facility] [address] [place], [address]   ENDOSCOPY PROCEDURE REPORT  PATIENT: Regina, Perry.  MR#: [id number] BIRTHDATE: September 12, 1960 , 51  yrs. old GENDER: Female ENDOSCOPIST: Meryl Dare, MD, [facility] REFERRED BY:  Aida Puffer, M.D. PROCEDURE DATE:  05/24/2012 PROCEDURE:  EGD w/ biopsy ASA CLASS:     Class II INDICATIONS:  Chest pain.   History of esophageal reflux. MEDICATIONS: MAC sedation, administered by CRNA and propofol (Diprivan) 150mg  IV TOPICAL ANESTHETIC: none DESCRIPTION OF PROCEDURE: After the risks benefits and alternatives of the procedure were thoroughly explained, informed consent was obtained.  The LB GIF-H180 [id number] endoscope was introduced through the mouth and advanced to the second portion of the duodenum without limitations.  The instrument was slowly withdrawn as the mucosa was fully examined.  STOMACH: Moderate erosive gastritis (inflammation) was found in the gastric antrum.  Multiple biopsies were performed.   The stomach otherwise appeared normal. ESOPHAGUS: The mucosa of the esophagus appeared normal. DUODENUM: The duodenal mucosa showed no abnormalities in the bulb and second portion of the duodenum.  Retroflexed views revealed a hiatal hernia.     The scope was then withdrawn from the patient and the procedure completed.  COMPLICATIONS: There were no complications.  ENDOSCOPIC IMPRESSION: 1.   Erosive gastritis (inflammation) in the antrum; multiple biopsies 2.   Small hiatal hernia  RECOMMENDATIONS: 1.  Avoid ASA/NSAIDS 2.  Anti-reflux regimen 3.  Continue PPI 4.  No cause for chest pain found    eSigned:  Meryl Dare, MD, [facility] 05/24/2012 11:15 AM

## 2012-05-24 NOTE — Progress Notes (Signed)
Patient did not experience any of the following events: a burn prior to discharge; a fall within the facility; wrong site/side/patient/procedure/implant event; or a hospital transfer or hospital admission upon discharge from the facility. (G8907) Patient did not have preoperative order for IV antibiotic SSI prophylaxis. (G8918)  

## 2012-05-24 NOTE — Progress Notes (Signed)
Called to room to assist during endoscopic procedure.  Patient ID and intended procedure confirmed with present staff. Received instructions for my participation in the procedure from the performing physician.  

## 2012-05-24 NOTE — Patient Instructions (Signed)
AVOID ASPIRIN , ASPIRIN PRODUCTS AND ANTIINFLAMATORIES (MOTRIN, ALEVE,IBUPROFEN,OR NAPROSYN TYPE MEDICATIONS)     YOU HAD AN ENDOSCOPIC PROCEDURE TODAY AT THE [facility]: Refer to the procedure report that was given to you for any specific questions about what was found during the examination.  If the procedure report does not answer your questions, please call your gastroenterologist to clarify.  If you requested that your care partner not be given the details of your procedure findings, then the procedure report has been included in a sealed envelope for you to review at your convenience later.  YOU SHOULD EXPECT: Some feelings of bloating in the abdomen. Passage of more gas than usual.  Walking can help get rid of the air that was put into your GI tract during the procedure and reduce the bloating. If you had a lower endoscopy (such as a colonoscopy or flexible sigmoidoscopy) you may notice spotting of blood in your stool or on the toilet paper. If you underwent a bowel prep for your procedure, then you may not have a normal bowel movement for a few days.  DIET: Your first meal following the procedure should be a light meal and then it is ok to progress to your normal diet.  A half-sandwich or bowl of soup is an example of a good first meal.  Heavy or fried foods are harder to digest and may make you feel nauseous or bloated.  Likewise meals heavy in dairy and vegetables can cause extra gas to form and this can also increase the bloating.  Drink plenty of fluids but you should avoid alcoholic beverages for 24 hours.  ACTIVITY: Your care partner should take you home directly after the procedure.  You should plan to take it easy, moving slowly for the rest of the day.  You can resume normal activity the day after the procedure however you should NOT DRIVE or use heavy machinery for 24 hours (because of the sedation medicines used during the test).    SYMPTOMS TO REPORT IMMEDIATELY: A  gastroenterologist can be reached at any hour.  During normal business hours, 8:30 AM to 5:00 PM Monday through Friday, call [phone].  After hours and on weekends, please call the GI answering service at [phone] who will take a message and have the physician on call contact you.   Following upper endoscopy (EGD)  Vomiting of blood or coffee ground material  New chest pain or pain under the shoulder blades  Painful or persistently difficult swallowing  New shortness of breath  Fever of 100F or higher  Black, tarry-looking stools  FOLLOW UP: If any biopsies were taken you will be contacted by phone or by letter within the next 1-3 weeks.  Call your gastroenterologist if you have not heard about the biopsies in 3 weeks.  Our staff will call the home number listed on your records the next business day following your procedure to check on you and address any questions or concerns that you may have at that time regarding the information given to you following your procedure. This is a courtesy call and so if there is no answer at the home number and we have not heard from you through the emergency physician on call, we will assume that you have returned to your regular daily activities without incident.  SIGNATURES/CONFIDENTIALITY: You and/or your care partner have signed paperwork which will be entered into your electronic medical record.  These signatures attest to the fact that that the  information above on your After Visit Summary has been reviewed and is understood.  Full responsibility of the confidentiality of this discharge information lies with you and/or your care-partner.

## 2012-05-24 NOTE — Progress Notes (Signed)
A/ox3 pleased with MAC report to RN

## 2012-05-25 ENCOUNTER — Telehealth: Payer: Self-pay | Admitting: *Deleted

## 2012-05-25 NOTE — Telephone Encounter (Signed)
  Follow up Call-  Call back number 05/24/2012  Post procedure Call Back phone  # [phone]  Permission to leave phone message Yes     Patient questions:  Do you have a fever, pain , or abdominal swelling? no Pain Score  0 *  Have you tolerated food without any problems? yes  Have you been able to return to your normal activities? yes  Do you have any questions about your discharge instructions: Diet   no Medications  no Follow up visit  no  Do you have questions or concerns about your Care? no  Actions: * If pain score is 4 or above: No action needed, pain <4.

## 2012-05-30 ENCOUNTER — Encounter: Payer: Self-pay | Admitting: Gastroenterology

## 2012-06-18 ENCOUNTER — Telehealth: Payer: Self-pay | Admitting: *Deleted

## 2012-06-18 NOTE — Telephone Encounter (Signed)
Recall colonoscopy entered into EPIC for 12/2018.

## 2012-06-18 NOTE — Telephone Encounter (Signed)
Message copied by Richardson Chiquito on Mon Jun 18, 2012 12:32 PM ------      Message from: Claudette Head T      Created: Mon Jun 18, 2012 12:18 PM       Colonoscopy and egd reports and path reports from 12/30/2008 by  Dr. Evette Cristal reviewed showing mild gastropathy and a hyperplastic colon polyp.      Please enter a recall colonoscopy for 12/2018.        ------

## 2012-08-13 ENCOUNTER — Encounter: Payer: Self-pay | Admitting: Gastroenterology

## 2012-08-13 ENCOUNTER — Ambulatory Visit (INDEPENDENT_AMBULATORY_CARE_PROVIDER_SITE_OTHER): Payer: 59 | Admitting: Gastroenterology

## 2012-08-13 VITALS — BP 132/86 | HR 80 | Ht 63.0 in | Wt 133.8 lb

## 2012-08-13 DIAGNOSIS — K5900 Constipation, unspecified: Secondary | ICD-10-CM

## 2012-08-13 DIAGNOSIS — R1032 Left lower quadrant pain: Secondary | ICD-10-CM

## 2012-08-13 DIAGNOSIS — K921 Melena: Secondary | ICD-10-CM

## 2012-08-13 MED ORDER — MOVIPREP 100 G PO SOLR: 1.0000 | Freq: Once | ORAL | Status: DC

## 2012-08-13 NOTE — Progress Notes (Addendum)
History of Present Illness: This is a 52 year old female who developed acute onset of left lower quadrant pain followed by bloody diarrhea earlier this month. All symptoms resolved after several days and since that time she has had episodic constipation but otherwise feels well. She previously underwent colonoscopy by Dr. Evette Cristal in 12/2008 showing a small rectal hyperplastic polyp and no other abnormalities.  Current Medications, Allergies, Past Medical History, Past Surgical History, Family History and Social History were reviewed in [employer] record.  Physical Exam: General: Well developed , well nourished, no acute distress Head: Normocephalic and atraumatic Eyes:  sclerae anicteric, EOMI Ears: Normal auditory acuity Mouth: No deformity or lesions Lungs: Clear throughout to auscultation Heart: Regular rate and rhythm; no murmurs, rubs or bruits Abdomen: Soft, non tender and non distended. No masses, hepatosplenomegaly or hernias noted. Normal Bowel sounds Rectal: Deferred to colonoscopy Musculoskeletal: Symmetrical with no gross deformities  Pulses:  Normal pulses noted Extremities: No clubbing, cyanosis, edema or deformities noted Neurological: Alert oriented x 4, grossly nonfocal Psychological:  Alert and cooperative. Normal mood and affect  Assessment and Recommendations:  1. Rule out ischemic or infectious colitis. Less likely IBD or neoplasm. Schedule colonoscopy. The risks, benefits, and alternatives to colonoscopy with possible biopsy and possible polypectomy were discussed with the patient and they consent to proceed.

## 2012-08-13 NOTE — Patient Instructions (Addendum)
You have been given a separate informational sheet regarding your tobacco use, the importance of quitting and local resources to help you quit.  You have been scheduled for a colonoscopy with propofol. Please follow written instructions given to you at your visit today.  Please pick up your prep kit at the pharmacy within the next 1-3 days. If you use inhalers (even only as needed), please bring them with you on the day of your procedure. Your physician has requested that you go to www.startemmi.com and enter the access code given to you at your visit today. This web site gives a general overview about your procedure. However, you should still follow specific instructions given to you by our office regarding your preparation for the procedure.  You can purchase Milk of Magnesium OR Miralax over the counter and use daily for constipation.  cc: Aida Puffer, MD

## 2012-08-24 ENCOUNTER — Encounter: Payer: Self-pay | Admitting: Gastroenterology

## 2012-08-24 ENCOUNTER — Ambulatory Visit (AMBULATORY_SURGERY_CENTER): Payer: 59 | Admitting: Gastroenterology

## 2012-08-24 VITALS — BP 145/75 | HR 56 | Temp 97.9°F | Resp 31 | Ht 63.0 in | Wt 133.0 lb

## 2012-08-24 DIAGNOSIS — K921 Melena: Secondary | ICD-10-CM

## 2012-08-24 MED ORDER — SODIUM CHLORIDE 0.9 % IV SOLN: 8.0000 mg | Freq: Once | INTRAVENOUS | Status: DC

## 2012-08-24 MED ORDER — SODIUM CHLORIDE 0.9 % IV SOLN: 500.0000 mL | INTRAVENOUS | Status: DC

## 2012-08-24 NOTE — Patient Instructions (Addendum)
Discharge instructions given with verbal understanding. Handouts on hemorrhoids and a high fiber diet given. Resume previous medications. YOU HAD AN ENDOSCOPIC PROCEDURE TODAY AT THE [facility]: Refer to the procedure report that was given to you for any specific questions about what was found during the examination.  If the procedure report does not answer your questions, please call your gastroenterologist to clarify.  If you requested that your care partner not be given the details of your procedure findings, then the procedure report has been included in a sealed envelope for you to review at your convenience later.  YOU SHOULD EXPECT: Some feelings of bloating in the abdomen. Passage of more gas than usual.  Walking can help get rid of the air that was put into your GI tract during the procedure and reduce the bloating. If you had a lower endoscopy (such as a colonoscopy or flexible sigmoidoscopy) you may notice spotting of blood in your stool or on the toilet paper. If you underwent a bowel prep for your procedure, then you may not have a normal bowel movement for a few days.  DIET: Your first meal following the procedure should be a light meal and then it is ok to progress to your normal diet.  A half-sandwich or bowl of soup is an example of a good first meal.  Heavy or fried foods are harder to digest and may make you feel nauseous or bloated.  Likewise meals heavy in dairy and vegetables can cause extra gas to form and this can also increase the bloating.  Drink plenty of fluids but you should avoid alcoholic beverages for 24 hours.  ACTIVITY: Your care partner should take you home directly after the procedure.  You should plan to take it easy, moving slowly for the rest of the day.  You can resume normal activity the day after the procedure however you should NOT DRIVE or use heavy machinery for 24 hours (because of the sedation medicines used during the test).    SYMPTOMS TO  REPORT IMMEDIATELY: A gastroenterologist can be reached at any hour.  During normal business hours, 8:30 AM to 5:00 PM Monday through Friday, call [phone].  After hours and on weekends, please call the GI answering service at [phone] who will take a message and have the physician on call contact you.   Following lower endoscopy (colonoscopy or flexible sigmoidoscopy):  Excessive amounts of blood in the stool  Significant tenderness or worsening of abdominal pains  Swelling of the abdomen that is new, acute  Fever of 100F or higher  FOLLOW UP: If any biopsies were taken you will be contacted by phone or by letter within the next 1-3 weeks.  Call your gastroenterologist if you have not heard about the biopsies in 3 weeks.  Our staff will call the home number listed on your records the next business day following your procedure to check on you and address any questions or concerns that you may have at that time regarding the information given to you following your procedure. This is a courtesy call and so if there is no answer at the home number and we have not heard from you through the emergency physician on call, we will assume that you have returned to your regular daily activities without incident.  SIGNATURES/CONFIDENTIALITY: You and/or your care partner have signed paperwork which will be entered into your electronic medical record.  These signatures attest to the fact that that the information above on  your After Visit Summary has been reviewed and is understood.  Full responsibility of the confidentiality of this discharge information lies with you and/or your care-partner. 

## 2012-08-24 NOTE — Progress Notes (Signed)
Patient did not experience any of the following events: a burn prior to discharge; a fall within the facility; wrong site/side/patient/procedure/implant event; or a hospital transfer or hospital admission upon discharge from the facility. (G8907) Patient did not have preoperative order for IV antibiotic SSI prophylaxis. (G8918)  

## 2012-08-24 NOTE — Op Note (Signed)
[facility] [address] [place], [address]   COLONOSCOPY PROCEDURE REPORT  PATIENT: Regina Perry, Regina Perry.  MR#: [id number] BIRTHDATE: [birth year] , 52  yrs. old GENDER: Female ENDOSCOPIST: Meryl Dare, MD, [facility] REFERRED XB:JYNWG Little, M.D. PROCEDURE DATE:  08/24/2012 PROCEDURE:   Colonoscopy, diagnostic ASA CLASS:   Class II INDICATIONS:hematochezia. MEDICATIONS: MAC sedation, administered by CRNA, propofol (Diprivan) 200mg  IV, and Glycopyrrolate (Robinul) 0.2 mg IV DESCRIPTION OF PROCEDURE:   After the risks benefits and alternatives of the procedure were thoroughly explained, informed consent was obtained.  A digital rectal exam revealed no abnormalities of the rectum.   The LB [id number]  endoscope was introduced through the anus and advanced to the cecum, which was identified by both the appendix and ileocecal valve. No adverse events experienced.   The quality of the prep was excellent, using MoviPrep  The instrument was then slowly withdrawn as the colon was fully examined.  COLON FINDINGS: A normal appearing cecum, ileocecal valve, and appendiceal orifice were identified.  The ascending, hepatic flexure, transverse, splenic flexure, descending, sigmoid colon and rectum appeared unremarkable.  No polyps or cancers were seen. Retroflexed views revealed small internal hemorrhoids. The time to cecum=4 minutes 27 seconds.  Withdrawal time=8 minutes 28 seconds. The scope was withdrawn and the procedure completed.  COMPLICATIONS: There were no complications.  ENDOSCOPIC IMPRESSION: 1.  Normal colon 2.  Small internal hemorrhoids  RECOMMENDATIONS: 1.  Continue current colorectal screening recommendations for "routine risk" patients with a repeat colonoscopy in 10 years.   eSigned:  Meryl Dare, MD, [facility] 08/24/2012 3:49 PM

## 2012-08-24 NOTE — Progress Notes (Signed)
Patient states very bad migraine and is nauseated and photo phobic.  Dr. Russella Dar stated that she could have zofran 8mg  IV stat.   Zofran diluted in 4mg  per 20 ml ns per dose.  Well tolerated.

## 2012-08-27 ENCOUNTER — Telehealth: Payer: Self-pay | Admitting: *Deleted

## 2012-08-27 NOTE — Telephone Encounter (Signed)
Number identifier, left message, follow-up  

## 2012-09-21 ENCOUNTER — Encounter (HOSPITAL_COMMUNITY): Payer: Self-pay | Admitting: Emergency Medicine

## 2012-09-21 ENCOUNTER — Emergency Department (HOSPITAL_COMMUNITY)
Admission: EM | Admit: 2012-09-21 | Discharge: 2012-09-22 | Disposition: A | Discharge status: Home / Self Care | Payer: 59 | Attending: Emergency Medicine | Admitting: Emergency Medicine

## 2012-09-21 DIAGNOSIS — G43909 Migraine, unspecified, not intractable, without status migrainosus: Secondary | ICD-10-CM | POA: Insufficient documentation

## 2012-09-21 DIAGNOSIS — F411 Generalized anxiety disorder: Secondary | ICD-10-CM | POA: Insufficient documentation

## 2012-09-21 DIAGNOSIS — F17200 Nicotine dependence, unspecified, uncomplicated: Secondary | ICD-10-CM | POA: Insufficient documentation

## 2012-09-21 DIAGNOSIS — W010XXA Fall on same level from slipping, tripping and stumbling without subsequent striking against object, initial encounter: Secondary | ICD-10-CM | POA: Insufficient documentation

## 2012-09-21 DIAGNOSIS — Y9389 Activity, other specified: Secondary | ICD-10-CM | POA: Insufficient documentation

## 2012-09-21 DIAGNOSIS — Z79899 Other long term (current) drug therapy: Secondary | ICD-10-CM | POA: Insufficient documentation

## 2012-09-21 DIAGNOSIS — I10 Essential (primary) hypertension: Secondary | ICD-10-CM | POA: Insufficient documentation

## 2012-09-21 DIAGNOSIS — Z8701 Personal history of pneumonia (recurrent): Secondary | ICD-10-CM | POA: Insufficient documentation

## 2012-09-21 DIAGNOSIS — Z8709 Personal history of other diseases of the respiratory system: Secondary | ICD-10-CM | POA: Insufficient documentation

## 2012-09-21 DIAGNOSIS — S63509A Unspecified sprain of unspecified wrist, initial encounter: Secondary | ICD-10-CM | POA: Insufficient documentation

## 2012-09-21 DIAGNOSIS — Y929 Unspecified place or not applicable: Secondary | ICD-10-CM | POA: Insufficient documentation

## 2012-09-21 DIAGNOSIS — E7800 Pure hypercholesterolemia, unspecified: Secondary | ICD-10-CM | POA: Insufficient documentation

## 2012-09-21 NOTE — ED Notes (Signed)
PT. TRIPPED AND FELL BACKWARDS WHILE PLAYING WITH GRANDSON THIS EVENING , NO LOC/ AMBULATORY , REPORTS PAIN AT RIGHT WRIST AND FOREARM , NO VISIBLE DEFORMITY OR SWELLING.

## 2012-09-22 ENCOUNTER — Emergency Department (HOSPITAL_COMMUNITY): Payer: 59

## 2012-09-22 MED ORDER — IBUPROFEN 400 MG PO TABS
800.0000 mg | ORAL_TABLET | Freq: Once | ORAL | Status: AC
  Administered 2012-09-22: 800 mg via ORAL
  Filled 2012-09-22: qty 4

## 2012-09-22 MED ORDER — IBUPROFEN 800 MG PO TABS: 800.0000 mg | ORAL_TABLET | Freq: Three times a day (TID) | ORAL | Status: DC

## 2012-09-22 NOTE — ED Provider Notes (Signed)
History    CSN: [id number] Arrival date & time 09/21/12  2331  First MD Initiated Contact with Patient 09/22/12 0009     Chief Complaint  Patient presents with  . Wrist Pain   (Consider location/radiation/quality/duration/timing/severity/associated sxs/prior Treatment) HPI Comments: Patient was playing outside with her grandson when she fell backwards. Patient extended her right wrist to catch herself before hitting the ground. She denies hitting her head or loss of consciousness. Pain is throbbing and intermittently radiating to her right elbow.  Patient is a 52 y.o. female presenting with wrist pain. The history is provided by the patient. No language interpreter was used.  Wrist Pain This is a new problem. The current episode started today (7 hours ago). The problem occurs constantly. The problem has been gradually worsening. Associated symptoms include arthralgias, joint swelling (Mild) and myalgias. Pertinent negatives include no fever, nausea, numbness, rash or weakness. Exacerbated by: Movement and palpation. Treatments tried: Hydrocodone. The treatment provided moderate relief.   Past Medical History  Diagnosis Date  . Hypertension   . Bronchitis   . Hypercholesteremia   . H/O cesarean section   . Anxiety   . Migraine   . Pneumonia    Past Surgical History  Procedure Laterality Date  . Abdominal hysterectomy      partial  . Cesarean section    . Tonsillectomy and adenoidectomy     Family History  Problem Relation Age of Onset  . Breast cancer Paternal Grandmother   . Heart disease Mother     MI  . Irritable bowel syndrome Mother   . Migraines Mother   . Lung cancer Paternal Grandfather   . Colon cancer Neg Hx   . Esophageal cancer Neg Hx   . Rectal cancer Neg Hx   . Stomach cancer Neg Hx    History  Substance Use Topics  . Smoking status: Current Every Day Smoker -- 0.25 packs/day for 35 years    Types: Cigarettes  . Smokeless tobacco: Never Used  .  Alcohol Use: No   OB History   Grav Para Term Preterm Abortions TAB SAB Ect Mult Living                 Review of Systems  Constitutional: Negative for fever.  Gastrointestinal: Negative for nausea.  Musculoskeletal: Positive for myalgias, joint swelling (Mild) and arthralgias.  Skin: Negative for pallor and rash.  Neurological: Negative for weakness and numbness.  All other systems reviewed and are negative.    Allergies  Review of patient's allergies indicates no known allergies.  Home Medications   Current Outpatient Rx  Name  Route  Sig  Dispense  Refill  . ALPRAZolam (XANAX) 0.5 MG tablet   Oral   Take 0.5 mg by mouth 2 (two) times daily as needed for sleep or anxiety.          Marland Kitchen estradiol (ESTRACE) 1 MG tablet   Oral   Take 1 mg by mouth at bedtime.          Marland Kitchen HYDROcodone-acetaminophen (VICODIN) 5-500 MG per tablet   Oral   Take 0.5-1 tablets by mouth 4 (four) times daily as needed for pain.          Marland Kitchen losartan (COZAAR) 50 MG tablet   Oral   Take 50 mg by mouth at bedtime.          . lovastatin (MEVACOR) 20 MG tablet   Oral   Take 20 mg by mouth at bedtime.         Marland Kitchen  metoprolol tartrate (LOPRESSOR) 25 MG tablet   Oral   Take 1 tablet (25 mg total) by mouth 2 (two) times daily.   60 tablet   0   . omeprazole (PRILOSEC) 20 MG capsule   Oral   Take 1 capsule (20 mg total) by mouth 2 (two) times daily.   60 capsule   11   . promethazine (PHENERGAN) 25 MG tablet   Oral   Take 25 mg by mouth as needed for nausea (nausea associated with Migraine Headaches).          . SUMAtriptan (IMITREX) 100 MG tablet   Oral   Take 100 mg by mouth every 2 (two) hours as needed. For migraines         . topiramate (TOPAMAX) 100 MG tablet   Oral   Take 100 mg by mouth at bedtime.         . ergocalciferol (VITAMIN D2) 50000 UNITS capsule   Oral   Take 50,000 Units by mouth once a week. Usually on Monday         . ibuprofen (ADVIL,MOTRIN) 800 MG  tablet   Oral   Take 1 tablet (800 mg total) by mouth 3 (three) times daily.   21 tablet   0    BP 145/90  Pulse 92  Temp(Src) 97.6 F (36.4 C) (Oral)  Resp 20  SpO2 98% Physical Exam  Nursing note and vitals reviewed. Constitutional: She is oriented to person, place, and time. She appears well-developed and well-nourished. No distress.  HENT:  Head: Normocephalic and atraumatic.  Eyes: Conjunctivae and EOM are normal. No scleral icterus.  Neck: Normal range of motion.  Cardiovascular: Normal rate, regular rhythm and intact distal pulses.   Distal radial pulses 2+ bilaterally. Capillary refill normal.  Musculoskeletal:       Right wrist: She exhibits decreased range of motion (secondary to pain with extension and mildly with flexion), tenderness, bony tenderness and swelling (Mild). She exhibits no effusion, no crepitus, no deformity and no laceration.       Right forearm: Normal.       Right hand: Normal.  Neurological: She is alert and oriented to person, place, and time.  No sensory or motor deficits appreciated. Capillary refill normal.  Skin: Skin is warm and dry. No rash noted. She is not diaphoretic. No erythema. No pallor.  Psychiatric: She has a normal mood and affect. Her behavior is normal.    ED Course  Procedures (including critical care time) Labs Reviewed - No data to display Dg Wrist Complete Right  09/22/2012   *RADIOLOGY REPORT*  Clinical Data: Medial pain after fall.  RIGHT WRIST - COMPLETE 3+ VIEW  Comparison: None.  Findings: Mild osteopenia.  Radiocarpal mild degenerative change. Scaphoid intact. No acute fracture or dislocation.  IMPRESSION: Osteopenia and mild osteoarthritis. No acute osseous abnormality.   Original Report Authenticated By: Jeronimo Greaves, M.D.    1. Wrist sprain and strain, right, initial encounter     MDM  Uncomplicated R wrist sprain - Patient neurovascularly intact on exam. Xray without evidence of fracture or dislocation. There is  no pallor, poikilothermia, paresthesias, or pulselessness to suspect injury complication. Wrist splint applied in ED and ibuprofen given for pain control. Patient appropriate for d/c with PCP follow up. Ortho referral given if symptoms do not improve. RICE instruction provided. Indications for ED return discussed with patient who verbalizes comfort and understanding with d/c plan.     Antony Madura, PA-C 09/26/12 [phone]

## 2012-09-27 NOTE — ED Provider Notes (Signed)
Medical screening examination/treatment/procedure(s) were performed by non-physician practitioner and as supervising physician I was immediately available for consultation/collaboration.  Sunnie Nielsen, MD 09/27/12 [phone]

## 2013-02-04 ENCOUNTER — Other Ambulatory Visit: Payer: Self-pay | Admitting: Family Medicine

## 2013-02-04 ENCOUNTER — Ambulatory Visit
Admission: RE | Admit: 2013-02-04 | Discharge: 2013-02-04 | Disposition: A | Discharge status: Home / Self Care | Payer: 59 | Source: Ambulatory Visit | Attending: Family Medicine | Admitting: Family Medicine

## 2013-02-04 DIAGNOSIS — G894 Chronic pain syndrome: Secondary | ICD-10-CM

## 2013-02-04 DIAGNOSIS — M5431 Sciatica, right side: Secondary | ICD-10-CM

## 2013-02-14 ENCOUNTER — Other Ambulatory Visit: Payer: Self-pay | Admitting: Cardiology

## 2013-02-14 ENCOUNTER — Ambulatory Visit
Admission: RE | Admit: 2013-02-14 | Discharge: 2013-02-14 | Disposition: A | Discharge status: Home / Self Care | Payer: 59 | Source: Ambulatory Visit | Attending: Cardiology | Admitting: Cardiology

## 2013-02-14 DIAGNOSIS — R079 Chest pain, unspecified: Secondary | ICD-10-CM

## 2013-02-15 ENCOUNTER — Emergency Department (HOSPITAL_COMMUNITY)
Admission: EM | Admit: 2013-02-15 | Discharge: 2013-02-15 | Disposition: A | Discharge status: Home / Self Care | Payer: 59 | Attending: Emergency Medicine | Admitting: Emergency Medicine

## 2013-02-15 ENCOUNTER — Emergency Department (HOSPITAL_COMMUNITY): Payer: 59

## 2013-02-15 ENCOUNTER — Encounter (HOSPITAL_COMMUNITY): Payer: Self-pay | Admitting: Emergency Medicine

## 2013-02-15 DIAGNOSIS — I498 Other specified cardiac arrhythmias: Secondary | ICD-10-CM | POA: Insufficient documentation

## 2013-02-15 DIAGNOSIS — R209 Unspecified disturbances of skin sensation: Secondary | ICD-10-CM | POA: Insufficient documentation

## 2013-02-15 DIAGNOSIS — Z8701 Personal history of pneumonia (recurrent): Secondary | ICD-10-CM | POA: Insufficient documentation

## 2013-02-15 DIAGNOSIS — F17200 Nicotine dependence, unspecified, uncomplicated: Secondary | ICD-10-CM | POA: Insufficient documentation

## 2013-02-15 DIAGNOSIS — F411 Generalized anxiety disorder: Secondary | ICD-10-CM | POA: Insufficient documentation

## 2013-02-15 DIAGNOSIS — G43909 Migraine, unspecified, not intractable, without status migrainosus: Secondary | ICD-10-CM | POA: Insufficient documentation

## 2013-02-15 DIAGNOSIS — R112 Nausea with vomiting, unspecified: Secondary | ICD-10-CM | POA: Insufficient documentation

## 2013-02-15 DIAGNOSIS — R079 Chest pain, unspecified: Secondary | ICD-10-CM | POA: Insufficient documentation

## 2013-02-15 DIAGNOSIS — R001 Bradycardia, unspecified: Secondary | ICD-10-CM

## 2013-02-15 DIAGNOSIS — R5381 Other malaise: Secondary | ICD-10-CM | POA: Insufficient documentation

## 2013-02-15 DIAGNOSIS — Z79899 Other long term (current) drug therapy: Secondary | ICD-10-CM | POA: Insufficient documentation

## 2013-02-15 DIAGNOSIS — R42 Dizziness and giddiness: Secondary | ICD-10-CM | POA: Insufficient documentation

## 2013-02-15 DIAGNOSIS — I10 Essential (primary) hypertension: Secondary | ICD-10-CM | POA: Insufficient documentation

## 2013-02-15 DIAGNOSIS — E7800 Pure hypercholesterolemia, unspecified: Secondary | ICD-10-CM | POA: Insufficient documentation

## 2013-02-15 LAB — CBC
HCT: 43.2 % (ref 36.0–46.0)
Hemoglobin: 14.4 g/dL (ref 12.0–15.0)
MCH: 29.5 pg (ref 26.0–34.0)
MCHC: 33.3 g/dL (ref 30.0–36.0)
MCV: 88.5 fL (ref 78.0–100.0)
Platelets: 257 10*3/uL (ref 150–400)
RBC: 4.88 MIL/uL (ref 3.87–5.11)
RDW: 13.1 % (ref 11.5–15.5)
WBC: 12.4 10*3/uL — ABNORMAL HIGH (ref 4.0–10.5)

## 2013-02-15 LAB — COMPREHENSIVE METABOLIC PANEL
ALT: 13 U/L (ref 0–35)
AST: 10 U/L (ref 0–37)
Albumin: 3.8 g/dL (ref 3.5–5.2)
Alkaline Phosphatase: 73 U/L (ref 39–117)
BUN: 21 mg/dL (ref 6–23)
CO2: 22 mEq/L (ref 19–32)
Calcium: 9.5 mg/dL (ref 8.4–10.5)
Chloride: 103 mEq/L (ref 96–112)
Creatinine, Ser: 0.94 mg/dL (ref 0.50–1.10)
GFR calc Af Amer: 81 mL/min — ABNORMAL LOW (ref >90)
GFR calc non Af Amer: 70 mL/min — ABNORMAL LOW (ref >90)
Glucose, Bld: 139 mg/dL — ABNORMAL HIGH (ref 70–99)
Potassium: 3.9 mEq/L (ref 3.5–5.1)
Sodium: 139 mEq/L (ref 135–145)
Total Bilirubin: 0.3 mg/dL (ref 0.3–1.2)
Total Protein: 7 g/dL (ref 6.0–8.3)

## 2013-02-15 LAB — URINALYSIS, ROUTINE W REFLEX MICROSCOPIC
Bilirubin Urine: NEGATIVE
Glucose, UA: NEGATIVE mg/dL
Hgb urine dipstick: NEGATIVE
Ketones, ur: NEGATIVE mg/dL
Leukocytes, UA: NEGATIVE
Nitrite: NEGATIVE
Protein, ur: NEGATIVE mg/dL
Specific Gravity, Urine: 1.009 (ref 1.005–1.030)
Urobilinogen, UA: 0.2 mg/dL (ref 0.0–1.0)
pH: 7 (ref 5.0–8.0)

## 2013-02-15 LAB — POCT I-STAT, CHEM 8
BUN: 21 mg/dL (ref 6–23)
Calcium, Ion: 1.24 mmol/L — ABNORMAL HIGH (ref 1.12–1.23)
Chloride: 106 mEq/L (ref 96–112)
Creatinine, Ser: 1.1 mg/dL (ref 0.50–1.10)
Glucose, Bld: 139 mg/dL — ABNORMAL HIGH (ref 70–99)
HCT: 46 % (ref 36.0–46.0)
Hemoglobin: 15.6 g/dL — ABNORMAL HIGH (ref 12.0–15.0)
Potassium: 4 mEq/L (ref 3.5–5.1)
Sodium: 141 mEq/L (ref 135–145)
TCO2: 21 mmol/L (ref 0–100)

## 2013-02-15 LAB — POCT I-STAT TROPONIN I: Troponin i, poc: 0 ng/mL (ref 0.00–0.08)

## 2013-02-15 LAB — D-DIMER, QUANTITATIVE (NOT AT ARMC): D-Dimer, Quant: <0.27 ug/mL-FEU (ref 0.00–0.48)

## 2013-02-15 MED ORDER — SODIUM CHLORIDE 0.9 % IV BOLUS (SEPSIS): 1000.0000 mL | Freq: Once | INTRAVENOUS | Status: DC

## 2013-02-15 MED ORDER — SODIUM CHLORIDE 0.9 % IV BOLUS (SEPSIS)
1000.0000 mL | Freq: Once | INTRAVENOUS | Status: AC
  Administered 2013-02-15: 1000 mL via INTRAVENOUS

## 2013-02-15 MED ORDER — ASPIRIN 81 MG PO CHEW
324.0000 mg | CHEWABLE_TABLET | Freq: Once | ORAL | Status: AC
  Administered 2013-02-15: 324 mg via ORAL
  Filled 2013-02-15: qty 4

## 2013-02-15 MED ORDER — ACETAMINOPHEN 325 MG PO TABS: 650.0000 mg | ORAL_TABLET | Freq: Once | ORAL | Status: DC

## 2013-02-15 MED ORDER — ONDANSETRON HCL 4 MG/2ML IJ SOLN
4.0000 mg | Freq: Once | INTRAMUSCULAR | Status: AC
  Administered 2013-02-15: 4 mg via INTRAVENOUS
  Filled 2013-02-15: qty 2

## 2013-02-15 NOTE — ED Provider Notes (Signed)
Medical screening examination/treatment/procedure(s) were performed by non-physician practitioner and as supervising physician I was immediately available for consultation/collaboration.  EKG Interpretation    Date/Time:  Friday February 15 2013 17:47:25 EST Ventricular Rate:  60 PR Interval:  140 QRS Duration: 90 QT Interval:  394 QTC Calculation: 394 R Axis:   47 Text Interpretation:  Normal sinus rhythm Possible Anterior infarct , age undetermined Abnormal ECG No significant change since last tracing Confirmed by [facility]  MD, CHARLES [id number]) on 02/15/2013 5:51:50 PM              Regina Most B. Bernette Mayers, MD 02/15/13 2242

## 2013-02-15 NOTE — ED Notes (Signed)
Pt ambulated in hall pt a little light headed

## 2013-02-15 NOTE — ED Notes (Signed)
PT reports a Hx of HTN and was started on new B/P meds yesterday by DR Jacinto Halim. Meds were Metoprolol 50mg  BID PO and  Losartan-HCTZ. Pt reported feeling dizzy and a roaring in her ears earlier to day.

## 2013-02-15 NOTE — ED Notes (Signed)
PT comfortable with d/c and f/u instructions. No prescriptions. 

## 2013-02-15 NOTE — ED Provider Notes (Signed)
CSN: [id number]     Arrival date & time 02/15/13  1729 History   First MD Initiated Contact with Patient 02/15/13 1804     Chief Complaint  Patient presents with  . Dizziness  . Chest Pain   (Consider location/radiation/quality/duration/timing/severity/associated sxs/prior Treatment) The history is provided by the patient and medical records. No language interpreter was used.    Regina Perry is a 52 y.o. female  with a hx of HTN, anxiety presents to the Emergency Department complaining of gradual, persistent, progressively worsening dizziness onset this morning.  Pt reports she feels as if she has "an ocean running through her head."  Pt reports severe, generalized headache before bed which resolved by this morning.  Pt also reports tingling in her lower lip and chin.  She also states she was vomiting this morning.  Inspiration makes her chest pain worse and laying still makes it better.  Pt denies fever, chills, neck pain, chest pain, shortness of breath, cough, abdominal pain, dysuria, hematuria.  Pt began increased dose metoprolol and increased dose of losartan with HCTZ yesterday and has taken both today.     Past Medical History  Diagnosis Date  . Hypertension   . Bronchitis   . Hypercholesteremia   . H/O cesarean section   . Anxiety   . Migraine   . Pneumonia    Past Surgical History  Procedure Laterality Date  . Abdominal hysterectomy      partial  . Cesarean section    . Tonsillectomy and adenoidectomy     Family History  Problem Relation Age of Onset  . Breast cancer Paternal Grandmother   . Heart disease Mother     MI  . Irritable bowel syndrome Mother   . Migraines Mother   . Lung cancer Paternal Grandfather   . Colon cancer Neg Hx   . Esophageal cancer Neg Hx   . Rectal cancer Neg Hx   . Stomach cancer Neg Hx    History  Substance Use Topics  . Smoking status: Current Every Day Smoker -- 0.25 packs/day for 35 years    Types: Cigarettes  . Smokeless  tobacco: Never Used  . Alcohol Use: No   OB History   Grav Para Term Preterm Abortions TAB SAB Ect Mult Living                 Review of Systems  Constitutional: Negative for fever, diaphoresis, appetite change, fatigue and unexpected weight change.  HENT: Negative for mouth sores.   Eyes: Negative for visual disturbance.  Respiratory: Negative for cough, chest tightness, shortness of breath and wheezing.   Cardiovascular: Positive for chest pain.  Gastrointestinal: Positive for nausea and vomiting. Negative for abdominal pain, diarrhea and constipation.  Endocrine: Negative for polydipsia, polyphagia and polyuria.  Genitourinary: Negative for dysuria, urgency, frequency and hematuria.  Musculoskeletal: Negative for back pain and neck stiffness.  Skin: Negative for rash.  Allergic/Immunologic: Negative for immunocompromised state.  Neurological: Positive for weakness, light-headedness and numbness. Negative for syncope and headaches.  Hematological: Does not bruise/bleed easily.  Psychiatric/Behavioral: Negative for sleep disturbance. The patient is not nervous/anxious.     Allergies  Review of patient's allergies indicates no known allergies.  Home Medications   Current Outpatient Rx  Name  Route  Sig  Dispense  Refill  . ALPRAZolam (XANAX) 0.5 MG tablet   Oral   Take 0.5 mg by mouth 2 (two) times daily as needed for sleep or anxiety.          Marland Kitchen  buPROPion (WELLBUTRIN SR) 100 MG 12 hr tablet   Oral   Take 100 mg by mouth 2 (two) times daily.         . ergocalciferol (VITAMIN D2) 50000 UNITS capsule   Oral   Take 50,000 Units by mouth once a week. Usually on Monday         . estradiol (ESTRACE) 1 MG tablet   Oral   Take 1 mg by mouth at bedtime.          Marland Kitchen losartan-hydrochlorothiazide (HYZAAR) 100-12.5 MG per tablet   Oral   Take 1 tablet by mouth daily.         Marland Kitchen lovastatin (MEVACOR) 20 MG tablet   Oral   Take 20 mg by mouth at bedtime.         .  metoprolol (LOPRESSOR) 50 MG tablet   Oral   Take 50 mg by mouth 2 (two) times daily.         Marland Kitchen omeprazole (PRILOSEC) 20 MG capsule   Oral   Take 1 capsule (20 mg total) by mouth 2 (two) times daily.   60 capsule   11   . oxyCODONE-acetaminophen (PERCOCET/ROXICET) 5-325 MG per tablet   Oral   Take 0.5 tablets by mouth every 4 (four) hours as needed for moderate pain or severe pain.         . predniSONE (DELTASONE) 10 MG tablet   Oral   Take 10-20 mg by mouth See admin instructions. Days 1-4: Two tablets before breakfast, one after lunch, one after dinner, and two at bedtime. If started late in the day, take two tablets every hour for three hours, unless otherwise directed by prescriber.  Days 5-8: One tablet before breakfast, one after lunch, one after dinner, and one at bedtime  Days 9-12: One tablet before breakfast and one at bedtime         . promethazine (PHENERGAN) 25 MG tablet   Oral   Take 25 mg by mouth as needed for nausea (nausea associated with Migraine Headaches).          . SUMAtriptan (IMITREX) 100 MG tablet   Oral   Take 100 mg by mouth every 2 (two) hours as needed. For migraines         . topiramate (TOPAMAX) 100 MG tablet   Oral   Take 100 mg by mouth at bedtime.          BP 126/48  Pulse 51  Temp(Src) 98.4 F (36.9 C) (Oral)  Resp 14  Ht 5\' 4"  (1.626 m)  Wt 133 lb 3 oz (60.413 kg)  BMI 22.85 kg/m2  SpO2 96% Physical Exam  Nursing note and vitals reviewed. Constitutional: She is oriented to person, place, and time. She appears well-developed and well-nourished. No distress.  Awake, alert,   HENT:  Head: Normocephalic and atraumatic.  Mouth/Throat: Oropharynx is clear and moist. No oropharyngeal exudate.  Eyes: Conjunctivae and EOM are normal. Pupils are equal, round, and reactive to light. No scleral icterus.  Neck: Normal range of motion. Neck supple.  Cardiovascular: Normal rate, regular rhythm, normal heart sounds and intact distal  pulses.   No murmur heard. Pulmonary/Chest: Effort normal and breath sounds normal. No respiratory distress. She has no wheezes. She has no rales.  Regular rate and rhythm No murmurs  Abdominal: Soft. Bowel sounds are normal. She exhibits no mass. There is no tenderness. There is no rebound and no guarding.  Abdomen soft and nontender  Musculoskeletal: Normal range of motion. She exhibits no edema.  Neurological: She is alert and oriented to person, place, and time. She exhibits normal muscle tone. Coordination normal.  Speech is clear and goal oriented, follows commands Major Cranial nerves without deficit, no facial droop Normal strength in upper and lower extremities bilaterally including dorsiflexion and plantar flexion, strong and equal grip strength Sensation normal to light and sharp touch Moves extremities without ataxia, coordination intact Normal finger to nose and rapid alternating movements Neg romberg, no pronator drift Unable to ambulate due to dizziness  Skin: Skin is warm and dry. She is not diaphoretic. No erythema.  Psychiatric: She has a normal mood and affect. Her behavior is normal.    ED Course  Procedures (including critical care time) Labs Review Labs Reviewed  COMPREHENSIVE METABOLIC PANEL - Abnormal; Notable for the following:    Glucose, Bld 139 (*)    GFR calc non Af Amer 70 (*)    GFR calc Af Amer 81 (*)    All other components within normal limits  CBC - Abnormal; Notable for the following:    WBC 12.4 (*)    All other components within normal limits  POCT I-STAT, CHEM 8 - Abnormal; Notable for the following:    Glucose, Bld 139 (*)    Calcium, Ion 1.24 (*)    Hemoglobin 15.6 (*)    All other components within normal limits  URINALYSIS, ROUTINE W REFLEX MICROSCOPIC  D-DIMER, QUANTITATIVE  POCT I-STAT TROPONIN I   Imaging Review Dg Chest 2 View  02/14/2013   CLINICAL DATA:  Chest pain, history hypertension, smoking, bronchitis  EXAM: CHEST  2  VIEW  COMPARISON:  04/09/2012  FINDINGS: Normal heart size and pulmonary vascularity.  Prominent epicardial spot pad at right cardiophrenic angle stable.  Lungs minimally hyperaerated but clear.  No pleural effusion or pneumothorax.  Bones unremarkable.  IMPRESSION: Minimal hyperaeration without infiltrate.   Electronically Signed   By: Ulyses Southward M.D.   On: 02/14/2013 15:15   Ct Head Wo Contrast  02/15/2013   CLINICAL DATA:  Dizziness, headache.  EXAM: CT HEAD WITHOUT CONTRAST  TECHNIQUE: Contiguous axial images were obtained from the base of the skull through the vertex without intravenous contrast.  COMPARISON:  None.  FINDINGS: Bony calvarium appears intact. No mass effect or midline shift is noted. Ventricular size is within normal limits. There is no evidence of mass lesion, hemorrhage or acute infarction. 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  IMPRESSION: No acute intracranial abnormality seen.   Electronically Signed   By: Roque Lias M.D.   On: 02/15/2013 19:22    EKG Interpretation    Date/Time:  Friday February 15 2013 17:47:25 EST Ventricular Rate:  60 PR Interval:  140 QRS Duration: 90 QT Interval:  394 QTC Calculation: 394 R Axis:   47 Text Interpretation:  Normal sinus rhythm Possible Anterior infarct , age undetermined Abnormal ECG No significant change since last tracing Confirmed by [facility]  MD, CHARLES [phone]) on 02/15/2013 5:51:50 PM            MDM   1. Lightheadedness   2. Bradycardia     Regina Perry presents with lightheadedness, nausea and vomiting beginning 4 days ago and worsened 2 days ago with an increase in her blood pressure medications.  Patient with mild bradycardia and significant lightheadedness on movement. Patient without true nystagmus on exam. She reports increase in hypertension medications secondary to significantly elevated blood pressure 180/108.  UA without evidence of urinary tract infection, d-dimer negative, CBC with  mild leukocytosis at 12.4, no evidence of kidney failure.  Head CT unremarkable an EKG nonischemic.  Patient given 1 L of fluid and Zofran. She is tolerating by mouth without difficulty at this time. She reports significantly decreased lightheadedness. She relates in the [place] without difficulty and with steady gait.  Recommend decrease of metoprolol back to initial dosage of 25 mg twice per day. Recommend increasing fluid intake and close followup with her cardiologist. Patient is alert, oriented, nontoxic, nonseptic appearing, afebrile and not tachycardic.  It has been determined that no acute conditions requiring further emergency intervention are present at this time. The patient/guardian have been advised of the diagnosis and plan. We have discussed signs and symptoms that warrant return to the ED, such as changes or worsening in symptoms.   Vital signs are stable at discharge.   BP 126/48  Pulse 51  Temp(Src) 98.4 F (36.9 C) (Oral)  Resp 14  Ht 5\' 4"  (1.626 m)  Wt 133 lb 3 oz (60.413 kg)  BMI 22.85 kg/m2  SpO2 96%  Patient/guardian has voiced understanding and agreed to follow-up with the PCP or specialist.  Patient was discussed with and seen by Dr. [place] Sheldon who agrees with plan to discharge.          Dahlia Client Abella Shugart, PA-C 02/15/13 2234

## 2013-02-15 NOTE — ED Notes (Signed)
CT notified pt ready for transport

## 2013-07-10 ENCOUNTER — Emergency Department (INDEPENDENT_AMBULATORY_CARE_PROVIDER_SITE_OTHER): Payer: Worker's Compensation

## 2013-07-10 ENCOUNTER — Encounter (HOSPITAL_COMMUNITY): Payer: Self-pay | Admitting: Emergency Medicine

## 2013-07-10 ENCOUNTER — Emergency Department (INDEPENDENT_AMBULATORY_CARE_PROVIDER_SITE_OTHER)
Admission: EM | Admit: 2013-07-10 | Discharge: 2013-07-10 | Disposition: A | Discharge status: Home / Self Care | Payer: Worker's Compensation | Source: Home / Self Care | Attending: Emergency Medicine | Admitting: Emergency Medicine

## 2013-07-10 DIAGNOSIS — M75100 Unspecified rotator cuff tear or rupture of unspecified shoulder, not specified as traumatic: Secondary | ICD-10-CM

## 2013-07-10 DIAGNOSIS — S43429A Sprain of unspecified rotator cuff capsule, initial encounter: Secondary | ICD-10-CM | POA: Diagnosis not present

## 2013-07-10 DIAGNOSIS — Y9229 Other specified public building as the place of occurrence of the external cause: Secondary | ICD-10-CM

## 2013-07-10 DIAGNOSIS — X500XXA Overexertion from strenuous movement or load, initial encounter: Secondary | ICD-10-CM | POA: Diagnosis not present

## 2013-07-10 MED ORDER — OXYCODONE-ACETAMINOPHEN 5-325 MG PO TABS: ORAL_TABLET | ORAL | Status: DC

## 2013-07-10 MED ORDER — IBUPROFEN 800 MG PO TABS
800.0000 mg | ORAL_TABLET | Freq: Once | ORAL | Status: AC
  Administered 2013-07-10: 800 mg via ORAL

## 2013-07-10 MED ORDER — IBUPROFEN 800 MG PO TABS
ORAL_TABLET | ORAL | Status: AC
  Filled 2013-07-10: qty 1

## 2013-07-10 NOTE — ED Provider Notes (Signed)
Chief Complaint   Chief Complaint  Patient presents with  . Shoulder Injury    History of Present Illness   Regina Perry is a 53 year old female who at 429 AM this morning was reaching up on a stack of merchandise at work with her right arm. The merchandise was heavier than she thought it would be, her right arm was jerked downwards, she felt a pop in her shoulder. Ever since then has had severe pain in the shoulder superiorly and anteriorly. It hurts to move and she only has a few degrees of motion. The pain radiates down her arm into her hand and her hand feels numb and tingly. She's never had any injury to the shoulder before.  Review of Systems   Other than as noted above, the patient denies any of the following symptoms: Systemic:  No fevers or chills. Musculoskeletal:  No joint pain, arthritis, swelling, back pain, or neck pain. No history of arthritis.  Neurological:  No muscular weakness or paresthesia.  PMFSH   Past medical history, family history, social history, meds, and allergies were reviewed.  She has a history of migraines, hypertension, GERD, elevated cholesterol. She takes losartan, lovastatin, metoprolol, omeprazole, promethazine, sumatriptan, Topamax, Xanax, Wellbutrin, and Estrace.  Physical Examination     Vital signs:  BP 158/86  Pulse 78  Temp(Src) 97 F (36.1 C) (Oral)  Resp 18  SpO2 96% Gen:  Alert and oriented times 3.  In no distress. Musculoskeletal: She was very tender to touch there was no obvious deformity or bruising. It has just a few degrees of motion in all directions. She has severe pain with movement. Unable to perform Neer test, Hawkins test, and empty cans test because of pain Otherwise, all joints had a full a ROM with no swelling, bruising or deformity.  No edema, pulses full. Extremities were warm and pink.  Capillary refill was brisk.  Skin:  Clear, warm and dry.  No rash. Neuro:  Alert and oriented times 3.  Muscle strength was normal.   Sensation was intact to light touch.   Radiology   Dg Shoulder Right  07/10/2013   CLINICAL DATA:  Injury with popping sound.  Pain.  EXAM: RIGHT SHOULDER - 2+ VIEW  COMPARISON:  None.  FINDINGS: Humeral head is properly located. Normal humeral acromial distance. AC joint relationship is normal. No fracture or other focal finding.  IMPRESSION: Negative.   Electronically Signed   By: Paulina FusiMark  Shogry M.D.   On: 07/10/2013 18:23   I reviewed the images independently and personally and concur with the radiologist's findings.  Course in [facility]   Placed in a shoulder sling.  Assessment   The encounter diagnosis was Rotator cuff tear.  Will need short-term followup. Since this is a workers comp injury, she'll need to go to occupational medicine tomorrow and be referred from there, probably to an orthopedist.  Plan     1.  Meds:  The following meds were prescribed:   New Prescriptions   OXYCODONE-ACETAMINOPHEN (PERCOCET) 5-325 MG PER TABLET    1 to 2 tablets every 6 hours as needed for pain.    2.  Patient Education/Counseling:  The patient was given appropriate handouts, self care instructions, and instructed in symptomatic relief.  Suggested rest and ice tonight.  3.  Follow up:  The patient was told to follow up here if no better in 3 to 4 days, or sooner if becoming worse in any way, and given some red  flag symptoms such as worsening pain or new neurological symptoms which would prompt immediate return.       Reuben Likesavid C Quintavius Niebuhr, MD 07/10/13 [id number]

## 2013-07-10 NOTE — ED Notes (Signed)
Pt c/o right shoulder inj onset this am while at work Reports she was unloading a truck when she reached for a heavy box to bring down Box fell down and she "felt a pop"; pain increases w/activity Reports she kept on working through out the day w/limited ROM to right arm Alert w/no signs of acute distress.

## 2013-07-10 NOTE — Discharge Instructions (Signed)
Rotator Cuff Tear °The rotator cuff is four tendons that assist in the motion of the shoulder. A rotator cuff tear is a tear in one of these four tendons. It is characterized by pain and weakness of the shoulder. The rotator cuff tendons surround the shoulder ball and socket joint (humeral head). The rotator cuff tendons attach to the shoulder blade (scapula) on one side and the upper arm bone (humerus) on the other side. The rotator cuff is essential for shoulder stability and shoulder motion. °SYMPTOMS  °· Pain around the shoulder, often at the outer portion of the upper arm. °· Pain that is worse with shoulder function, especially when reaching overhead or lifting. °· Weakness of the shoulder muscles. °· Aching when not using your arm; often, pain awakens you at night, especially when sleeping on the affected side. °· Tenderness, swelling, warmth, or redness over the outer aspect of the shoulder. °· Loss of strength. °· Limited motion of the shoulder, especially reaching behind (reaching into one's back pocket) or across your body. °· A crackling sound (crepitation) when moving the shoulder. °· Biceps tendon pain (in the front of the shoulder) and inflammation, worse with bending the elbow or lifting. °CAUSES  °· Strain from sudden increase in amount or intensity of activity. °· Direct blow or injury to the shoulder. °· Aging, wear from from normal use. °· Roof of the shoulder (acromial) spur. °RISK INCREASES WITH:  °· Contact sports (football, wrestling, or boxing). °· Throwing or hitting sports (baseball, tennis, or volleyball). °· Weightlifting and bodybuilding. °· Heavy labor. °· Previous injury to rotator cuff. °· Failure to warm up properly before activity. °· Inadequate protective equipment. °· Increasing age. °· Spurring of the outer end of the scapula (acromion). °· Cortisone injections. °· Poor shoulder strength and flexibility. °PREVENTION °· Warm up and stretch properly before activity. °· Allow time  for rest and recovery between practices and competition. °· Maintain physical fitness: °· Cardiovascular fitness. °· Shoulder flexibility. °· Strength and endurance of the rotator cuff muscles and muscles of the shoulder blade. °· Learn and use proper technique when throwing or hitting. °PROGNOSIS °Surgery is often needed. Although, symptoms may go away by themselves. °RELATED COMPLICATIONS  °· Persistent pain that may progress to constant pain. °· Shoulder stiffness, frozen shoulder syndrome, or loss of motion. °· Recurrence of symptoms, especially if treated without surgery. °· Inability to return to same level of sports, even with surgery. °· Persistent weakness. °· Risks of surgery, including infection, bleeding, injury to nerves, shoulder stiffness, weakness, re-tearing of the rotator cuff tendon. °· Deltoid detachment, acromial fracture, and persistent pain. °TREATMENT °Treatment involves the use of ice and medicine to reduce pain and inflammation. Strengthening and stretching exercise are usually recommended. These exercises may be completed at home or with a therapist. You may also be instructed to modify offending activities. Corticosteroid injections may be given to reduce inflammation. Surgery is usually recommended for athletes. Surgery has the best chance for a full recovery. Surgery involves: °· Removal of an inflamed bursa. °· Removal of an acromial spur if present. °· Suturing the torn tendon back together. °Rotator cuff surgeries may be preformed either arthroscopically or through an open incision. Recovery typically takes 6 to 12 months. °MEDICATION °· If pain medicine is necessary, then nonsteroidal anti-inflammatory medicines, such as aspirin and ibuprofen, or other minor pain relievers, such as acetaminophen, are often recommended. °· Do not take pain medicine for 7 days before surgery. °· Prescription pain relievers are usually   only prescribed after surgery. Use only as directed and only as  much as you need. °· Corticosteroid injections may be given to reduce inflammation. However, there is a limited number of times the joint may be injected with these medicines. °HEAT AND COLD °· Cold treatment (icing) relieves pain and reduces inflammation. Cold treatment should be applied for 10 to 15 minutes every 2 to 3 hours for inflammation and pain and immediately after any activity that aggravates your symptoms. Use ice packs or massage the area with a piece of ice (ice massage). °· Heat treatment may be used prior to performing the stretching and strengthening activities prescribed by your caregiver, physical therapist, or athletic trainer. Use a heat pack or soak the injury in warm water. °SEEK MEDICAL CARE IF:  °· Symptoms get worse or do not improve in 4 to 6 weeks despite treatment. °· You experience pain, numbness, or coldness in the hand. °· Blue, gray, or dark color appears in the fingernails. °· New, unexplained symptoms develop (drugs used in treatment may produce side effects). °Document Released: 03/14/2005 Document Revised: 06/06/2011 Document Reviewed: 06/26/2008 °[employer]® Patient Information ©2014 [employer]. ° °

## 2013-08-26 HISTORY — PX: SHOULDER ARTHROSCOPY W/ ROTATOR CUFF REPAIR: SHX2400

## 2014-06-03 ENCOUNTER — Other Ambulatory Visit: Payer: Self-pay | Admitting: Otolaryngology

## 2014-06-03 DIAGNOSIS — J320 Chronic maxillary sinusitis: Secondary | ICD-10-CM

## 2014-06-03 DIAGNOSIS — R0981 Nasal congestion: Secondary | ICD-10-CM

## 2014-06-06 ENCOUNTER — Ambulatory Visit
Admission: RE | Admit: 2014-06-06 | Discharge: 2014-06-06 | Disposition: A | Discharge status: Home / Self Care | Payer: 59 | Source: Ambulatory Visit | Attending: Otolaryngology | Admitting: Otolaryngology

## 2014-06-06 DIAGNOSIS — R0981 Nasal congestion: Secondary | ICD-10-CM

## 2014-06-06 DIAGNOSIS — J320 Chronic maxillary sinusitis: Secondary | ICD-10-CM

## 2014-06-19 ENCOUNTER — Encounter (HOSPITAL_BASED_OUTPATIENT_CLINIC_OR_DEPARTMENT_OTHER): Payer: Self-pay | Admitting: *Deleted

## 2014-06-19 NOTE — Progress Notes (Signed)
To come in for ekg-bmet 

## 2014-06-23 ENCOUNTER — Encounter (HOSPITAL_BASED_OUTPATIENT_CLINIC_OR_DEPARTMENT_OTHER)
Admission: RE | Admit: 2014-06-23 | Discharge: 2014-06-23 | Disposition: A | Discharge status: Home / Self Care | Payer: 59 | Source: Ambulatory Visit | Attending: Otolaryngology | Admitting: Otolaryngology

## 2014-06-23 ENCOUNTER — Other Ambulatory Visit: Payer: Self-pay

## 2014-06-23 DIAGNOSIS — E780 Pure hypercholesterolemia: Secondary | ICD-10-CM | POA: Diagnosis not present

## 2014-06-23 DIAGNOSIS — Z79899 Other long term (current) drug therapy: Secondary | ICD-10-CM | POA: Diagnosis not present

## 2014-06-23 DIAGNOSIS — K219 Gastro-esophageal reflux disease without esophagitis: Secondary | ICD-10-CM | POA: Diagnosis not present

## 2014-06-23 DIAGNOSIS — F419 Anxiety disorder, unspecified: Secondary | ICD-10-CM | POA: Diagnosis not present

## 2014-06-23 DIAGNOSIS — I10 Essential (primary) hypertension: Secondary | ICD-10-CM | POA: Diagnosis not present

## 2014-06-23 DIAGNOSIS — Z9851 Tubal ligation status: Secondary | ICD-10-CM | POA: Diagnosis not present

## 2014-06-23 DIAGNOSIS — Z90710 Acquired absence of both cervix and uterus: Secondary | ICD-10-CM | POA: Diagnosis not present

## 2014-06-23 DIAGNOSIS — F17210 Nicotine dependence, cigarettes, uncomplicated: Secondary | ICD-10-CM | POA: Diagnosis not present

## 2014-06-23 DIAGNOSIS — J329 Chronic sinusitis, unspecified: Secondary | ICD-10-CM | POA: Diagnosis not present

## 2014-06-23 DIAGNOSIS — Z8701 Personal history of pneumonia (recurrent): Secondary | ICD-10-CM | POA: Diagnosis not present

## 2014-06-23 DIAGNOSIS — J343 Hypertrophy of nasal turbinates: Secondary | ICD-10-CM | POA: Diagnosis not present

## 2014-06-23 LAB — BASIC METABOLIC PANEL
Anion gap: 8 (ref 5–15)
BUN: 12 mg/dL (ref 6–23)
CO2: 27 mmol/L (ref 19–32)
Calcium: 9.4 mg/dL (ref 8.4–10.5)
Chloride: 107 mmol/L (ref 96–112)
Creatinine, Ser: 1.02 mg/dL (ref 0.50–1.10)
GFR calc Af Amer: 73 mL/min — ABNORMAL LOW (ref >90)
GFR calc non Af Amer: 63 mL/min — ABNORMAL LOW (ref >90)
Glucose, Bld: 118 mg/dL — ABNORMAL HIGH (ref 70–99)
Potassium: 4.3 mmol/L (ref 3.5–5.1)
Sodium: 142 mmol/L (ref 135–145)

## 2014-06-23 NOTE — Progress Notes (Signed)
EKG reviewed by Dr Crews. 

## 2014-06-25 ENCOUNTER — Other Ambulatory Visit: Payer: Self-pay | Admitting: Otolaryngology

## 2014-06-25 NOTE — H&P (Signed)
PREOPERATIVE H&P  Chief Complaint: nasal obstruction and sinus problems  HPI: Regina Perry is a 54 y.o. female who presents for evaluation of nasal obstruction and sinus problems. She frequently has trouble breathing through her nose especially on the left side. She's been on several rounds of antibiotics for sinus infections and has frequent headaches between he eyes. CT scan shows only mild sinus disease and deviated sinus. She's taken to the OR for septoplasty , turbinate reductions and FESS.  Past Medical History  Diagnosis Date  . Hypertension   . Bronchitis   . Hypercholesteremia   . H/O cesarean section   . Anxiety   . Migraine   . Pneumonia   . GERD (gastroesophageal reflux disease)    Past Surgical History  Procedure Laterality Date  . Abdominal hysterectomy      partial  . Cesarean section    . Tonsillectomy and adenoidectomy    . Tubal ligation    . Colonoscopy    . Shoulder arthroscopy w/ rotator cuff repair  6/15    right   History   Social History  . Marital Status: Married    Spouse Name: N/A  . Number of Children: N/A  . Years of Education: N/A   Social History Main Topics  . Smoking status: Current Every Day Smoker -- 1.00 packs/day for 35 years    Types: Cigarettes  . Smokeless tobacco: Never Used  . Alcohol Use: No  . Drug Use: No  . Sexual Activity: Not on file   Other Topics Concern  . None   Social History Narrative   Family History  Problem Relation Age of Onset  . Breast cancer Paternal Grandmother   . Heart disease Mother     MI  . Irritable bowel syndrome Mother   . Migraines Mother   . Lung cancer Paternal Grandfather   . Colon cancer Neg Hx   . Esophageal cancer Neg Hx   . Rectal cancer Neg Hx   . Stomach cancer Neg Hx    No Known Allergies Prior to Admission medications   Medication Sig Start Date End Date Taking? Authorizing Provider  fluticasone (FLONASE) 50 MCG/ACT nasal spray Place into both nostrils daily.   Yes  Historical Provider, MD  ALPRAZolam Prudy Feeler(XANAX) 0.5 MG tablet Take 0.5 mg by mouth 2 (two) times daily as needed for sleep or anxiety.     Historical Provider, MD  ergocalciferol (VITAMIN D2) 50000 UNITS capsule Take 50,000 Units by mouth once a week. Usually on Monday    Historical Provider, MD  estradiol (ESTRACE) 1 MG tablet Take 1 mg by mouth at bedtime.     Historical Provider, MD  losartan-hydrochlorothiazide (HYZAAR) 100-12.5 MG per tablet Take 1 tablet by mouth daily.    Historical Provider, MD  lovastatin (MEVACOR) 20 MG tablet Take 20 mg by mouth at bedtime.    Historical Provider, MD  omeprazole (PRILOSEC) 20 MG capsule Take 1 capsule (20 mg total) by mouth 2 (two) times daily. 05/16/12   Meryl DareMalcolm T Stark, MD  oxyCODONE-acetaminophen (PERCOCET/ROXICET) 5-325 MG per tablet Take 0.5 tablets by mouth every 4 (four) hours as needed for moderate pain or severe pain.    Historical Provider, MD  promethazine (PHENERGAN) 25 MG tablet Take 25 mg by mouth as needed for nausea (nausea associated with Migraine Headaches).     Historical Provider, MD  SUMAtriptan (IMITREX) 100 MG tablet Take 100 mg by mouth every 2 (two) hours as needed. For migraines  Historical Provider, MD  topiramate (TOPAMAX) 100 MG tablet Take 100 mg by mouth at bedtime.    Historical Provider, MD     Positive ROS: per HPI  All other systems have been reviewed and were otherwise negative with the exception of those mentioned in the HPI and as above.  Physical Exam: There were no vitals filed for this visit.  General: Alert, no acute distress Oral: Normal oral mucosa and tonsils Nasal: severe septal deviation to the left, large swollen turbinates, no polyps Neck: No palpable adenopathy or thyroid nodules Ear: Ear canal is clear with normal appearing TMs Cardiovascular: Regular rate and rhythm, no murmur.  Respiratory: Clear to auscultation Neurologic: Alert and oriented x 3   Assessment/Plan: SINUSITIS/TURBINATE  HYPERTROPHY Plan for Procedure(s): BILATERAL NASAL SEPTOPLASTY WITH TURBINATE REDUCTION BILATERAL ANTERIOR ETHMOIDECTOMY BILATERAL MAXILLARY OSTIA ENLARGEMENT   Dillard Cannon, MD 06/25/2014 10:47 AM

## 2014-06-26 ENCOUNTER — Ambulatory Visit (HOSPITAL_BASED_OUTPATIENT_CLINIC_OR_DEPARTMENT_OTHER): Payer: 59 | Admitting: Anesthesiology

## 2014-06-26 ENCOUNTER — Encounter (HOSPITAL_BASED_OUTPATIENT_CLINIC_OR_DEPARTMENT_OTHER): Payer: Self-pay

## 2014-06-26 ENCOUNTER — Ambulatory Visit (HOSPITAL_BASED_OUTPATIENT_CLINIC_OR_DEPARTMENT_OTHER)
Admission: RE | Admit: 2014-06-26 | Discharge: 2014-06-26 | Disposition: A | Discharge status: Home / Self Care | Payer: 59 | Source: Ambulatory Visit | Attending: Otolaryngology | Admitting: Otolaryngology

## 2014-06-26 ENCOUNTER — Encounter (HOSPITAL_BASED_OUTPATIENT_CLINIC_OR_DEPARTMENT_OTHER)
Admission: RE | Disposition: A | Discharge status: Home / Self Care | Payer: Self-pay | Source: Ambulatory Visit | Attending: Otolaryngology

## 2014-06-26 DIAGNOSIS — Z8701 Personal history of pneumonia (recurrent): Secondary | ICD-10-CM | POA: Insufficient documentation

## 2014-06-26 DIAGNOSIS — J343 Hypertrophy of nasal turbinates: Secondary | ICD-10-CM | POA: Insufficient documentation

## 2014-06-26 DIAGNOSIS — Z9851 Tubal ligation status: Secondary | ICD-10-CM | POA: Insufficient documentation

## 2014-06-26 DIAGNOSIS — J329 Chronic sinusitis, unspecified: Secondary | ICD-10-CM | POA: Insufficient documentation

## 2014-06-26 DIAGNOSIS — F17210 Nicotine dependence, cigarettes, uncomplicated: Secondary | ICD-10-CM | POA: Insufficient documentation

## 2014-06-26 DIAGNOSIS — I10 Essential (primary) hypertension: Secondary | ICD-10-CM | POA: Insufficient documentation

## 2014-06-26 DIAGNOSIS — K219 Gastro-esophageal reflux disease without esophagitis: Secondary | ICD-10-CM | POA: Insufficient documentation

## 2014-06-26 DIAGNOSIS — E780 Pure hypercholesterolemia: Secondary | ICD-10-CM | POA: Insufficient documentation

## 2014-06-26 DIAGNOSIS — Z79899 Other long term (current) drug therapy: Secondary | ICD-10-CM | POA: Insufficient documentation

## 2014-06-26 DIAGNOSIS — Z90710 Acquired absence of both cervix and uterus: Secondary | ICD-10-CM | POA: Insufficient documentation

## 2014-06-26 DIAGNOSIS — F419 Anxiety disorder, unspecified: Secondary | ICD-10-CM | POA: Insufficient documentation

## 2014-06-26 HISTORY — PX: MAXILLARY ANTROSTOMY: SHX2003

## 2014-06-26 HISTORY — PX: NASAL SEPTOPLASTY W/ TURBINOPLASTY: SHX2070

## 2014-06-26 HISTORY — DX: Gastro-esophageal reflux disease without esophagitis: K21.9

## 2014-06-26 HISTORY — PX: ETHMOIDECTOMY: SHX5197

## 2014-06-26 LAB — POCT HEMOGLOBIN-HEMACUE: Hemoglobin: 14.7 g/dL (ref 12.0–15.0)

## 2014-06-26 SURGERY — SEPTOPLASTY, NOSE, WITH NASAL TURBINATE REDUCTION
Anesthesia: General | Laterality: Bilateral

## 2014-06-26 MED ORDER — ONDANSETRON HCL 4 MG/2ML IJ SOLN: 4.0000 mg | Freq: Four times a day (QID) | INTRAMUSCULAR | Status: DC | PRN

## 2014-06-26 MED ORDER — PHENYLEPHRINE HCL 10 MG/ML IJ SOLN
10.0000 mg | INTRAMUSCULAR | Status: DC | PRN
Start: 2014-06-26 — End: 2014-06-26
  Administered 2014-06-26: 40 ug/min via INTRAVENOUS

## 2014-06-26 MED ORDER — HYDROMORPHONE HCL 1 MG/ML IJ SOLN: 0.2500 mg | INTRAMUSCULAR | Status: DC | PRN

## 2014-06-26 MED ORDER — PROPOFOL 10 MG/ML IV BOLUS
INTRAVENOUS | Status: DC | PRN
  Administered 2014-06-26: 200 mg via INTRAVENOUS

## 2014-06-26 MED ORDER — SUFENTANIL CITRATE 50 MCG/ML IV SOLN
INTRAVENOUS | Status: DC | PRN
  Administered 2014-06-26: 20 ug via INTRAVENOUS

## 2014-06-26 MED ORDER — MIDAZOLAM HCL 5 MG/5ML IJ SOLN
INTRAMUSCULAR | Status: DC | PRN
  Administered 2014-06-26: 2 mg via INTRAVENOUS

## 2014-06-26 MED ORDER — OXYMETAZOLINE HCL 0.05 % NA SOLN
NASAL | Status: AC
  Filled 2014-06-26: qty 15

## 2014-06-26 MED ORDER — BACITRACIN ZINC 500 UNIT/GM EX OINT
TOPICAL_OINTMENT | CUTANEOUS | Status: AC
  Filled 2014-06-26: qty 28.35

## 2014-06-26 MED ORDER — MIDAZOLAM HCL 2 MG/2ML IJ SOLN: 1.0000 mg | INTRAMUSCULAR | Status: DC | PRN

## 2014-06-26 MED ORDER — SUCCINYLCHOLINE CHLORIDE 20 MG/ML IJ SOLN
INTRAMUSCULAR | Status: DC | PRN
  Administered 2014-06-26: 100 mg via INTRAVENOUS

## 2014-06-26 MED ORDER — CEFAZOLIN SODIUM-DEXTROSE 2-3 GM-% IV SOLR
INTRAVENOUS | Status: AC
  Filled 2014-06-26: qty 50

## 2014-06-26 MED ORDER — CEFAZOLIN SODIUM-DEXTROSE 2-3 GM-% IV SOLR
2.0000 g | INTRAVENOUS | Status: AC
  Administered 2014-06-26: 2 g via INTRAVENOUS

## 2014-06-26 MED ORDER — BACITRACIN ZINC 500 UNIT/GM EX OINT
TOPICAL_OINTMENT | CUTANEOUS | Status: DC | PRN
  Administered 2014-06-26: 1 via TOPICAL

## 2014-06-26 MED ORDER — OXYCODONE HCL 5 MG PO TABS
ORAL_TABLET | ORAL | Status: AC
  Filled 2014-06-26: qty 1

## 2014-06-26 MED ORDER — FENTANYL CITRATE 0.05 MG/ML IJ SOLN: 50.0000 ug | INTRAMUSCULAR | Status: DC | PRN

## 2014-06-26 MED ORDER — METHYLPREDNISOLONE ACETATE 80 MG/ML IJ SUSP
INTRAMUSCULAR | Status: AC
  Filled 2014-06-26: qty 1

## 2014-06-26 MED ORDER — SODIUM CHLORIDE 0.9 % IJ SOLN
INTRAMUSCULAR | Status: DC | PRN
  Administered 2014-06-26: 5 mL

## 2014-06-26 MED ORDER — DEXAMETHASONE SODIUM PHOSPHATE 4 MG/ML IJ SOLN
INTRAMUSCULAR | Status: DC | PRN
  Administered 2014-06-26: 10 mg via INTRAVENOUS

## 2014-06-26 MED ORDER — LIDOCAINE HCL (CARDIAC) 20 MG/ML IV SOLN
INTRAVENOUS | Status: DC | PRN
  Administered 2014-06-26: 75 mg via INTRAVENOUS

## 2014-06-26 MED ORDER — LACTATED RINGERS IV SOLN
INTRAVENOUS | Status: DC
  Administered 2014-06-26 (×2): via INTRAVENOUS

## 2014-06-26 MED ORDER — OXYCODONE HCL 5 MG/5ML PO SOLN: 5.0000 mg | Freq: Once | ORAL | Status: AC | PRN

## 2014-06-26 MED ORDER — MIDAZOLAM HCL 2 MG/2ML IJ SOLN
INTRAMUSCULAR | Status: AC
  Filled 2014-06-26: qty 2

## 2014-06-26 MED ORDER — OXYMETAZOLINE HCL 0.05 % NA SOLN
NASAL | Status: DC | PRN
  Administered 2014-06-26: 1 via NASAL

## 2014-06-26 MED ORDER — SUFENTANIL CITRATE 50 MCG/ML IV SOLN
INTRAVENOUS | Status: AC
  Filled 2014-06-26: qty 1

## 2014-06-26 MED ORDER — CEPHALEXIN 500 MG PO CAPS: 500.0000 mg | ORAL_CAPSULE | Freq: Two times a day (BID) | ORAL | Status: DC

## 2014-06-26 MED ORDER — EPHEDRINE SULFATE 50 MG/ML IJ SOLN
INTRAMUSCULAR | Status: DC | PRN
  Administered 2014-06-26: 15 mg via INTRAVENOUS
  Administered 2014-06-26: 10 mg via INTRAVENOUS

## 2014-06-26 MED ORDER — BACIT-POLY-NEO HC 1 % EX OINT
TOPICAL_OINTMENT | CUTANEOUS | Status: AC
  Filled 2014-06-26: qty 15

## 2014-06-26 MED ORDER — SODIUM CHLORIDE 0.9 % IJ SOLN
INTRAMUSCULAR | Status: AC
  Filled 2014-06-26: qty 10

## 2014-06-26 MED ORDER — OXYCODONE HCL 5 MG PO TABS
5.0000 mg | ORAL_TABLET | Freq: Once | ORAL | Status: AC | PRN
  Administered 2014-06-26: 5 mg via ORAL

## 2014-06-26 MED ORDER — HYDROCODONE-ACETAMINOPHEN 5-325 MG PO TABS: 1.0000 | ORAL_TABLET | Freq: Four times a day (QID) | ORAL | Status: DC | PRN

## 2014-06-26 MED ORDER — LIDOCAINE-EPINEPHRINE 1 %-1:100000 IJ SOLN
INTRAMUSCULAR | Status: DC | PRN
  Administered 2014-06-26: 9 mL

## 2014-06-26 SURGICAL SUPPLY — 65 items
ATTRACTOMAT 16X20 MAGNETIC DRP (DRAPES) IMPLANT
BLADE INF TURB ROT M4 2 5PK (BLADE) ×2 IMPLANT
BLADE INF TURB ROT M4 2MM 5PK (BLADE) ×1
BLADE RAD40 ROTATE 4M 4 5PK (BLADE) IMPLANT
BLADE RAD40 ROTATE 4M 4MM 5PK (BLADE)
BLADE RAD60 ROTATE M4 4 5PK (BLADE) IMPLANT
BLADE RAD60 ROTATE M4 4MM 5PK (BLADE)
BLADE TRICUT ROTATE M4 4 5PK (BLADE) ×2 IMPLANT
BLADE TRICUT ROTATE M4 4MM 5PK (BLADE) ×1
BUR HS RAD FRONTAL 3 (BURR) IMPLANT
BUR HS RAD FRONTAL 3MM (BURR)
CANISTER SUC SOCK COL 7IN (MISCELLANEOUS) ×3 IMPLANT
CANISTER SUCT 1200ML W/VALVE (MISCELLANEOUS) ×3 IMPLANT
CATH SINUS BALLN 7X16 (CATHETERS) IMPLANT
CATH SINUS BALLN RELIEV 6X16 (SINUPLASTY) IMPLANT
CATH SINUS GUIDE F-70 (CATHETERS) IMPLANT
CATH SINUS GUIDE M/110 (CATHETERS) IMPLANT
CATH SINUS IRRIGATION 2.0 (CATHETERS) IMPLANT
CLEANER CAUTERY TIP 5X5 PAD (MISCELLANEOUS) IMPLANT
COAGULATOR SUCT 8FR VV (MISCELLANEOUS) ×3 IMPLANT
DECANTER SPIKE VIAL GLASS SM (MISCELLANEOUS) ×3 IMPLANT
DEVICE INFLATION 20/61 (MISCELLANEOUS) IMPLANT
DRESSING ADAPTIC 1/2  N-ADH (PACKING) IMPLANT
DRESSING NASAL KENNEDY 3.5X.9 (MISCELLANEOUS) IMPLANT
DRSG NASAL KENNEDY 3.5X.9 (MISCELLANEOUS)
DRSG NASAL KENNEDY LMNT 8CM (GAUZE/BANDAGES/DRESSINGS) IMPLANT
DRSG NASOPORE 8CM (GAUZE/BANDAGES/DRESSINGS) ×3 IMPLANT
DRSG TELFA 3X8 NADH (GAUZE/BANDAGES/DRESSINGS) IMPLANT
ELECT COATED BLADE 2.86 ST (ELECTRODE) IMPLANT
ELECT REM PT RETURN 9FT ADLT (ELECTROSURGICAL) ×3
ELECTRODE REM PT RTRN 9FT ADLT (ELECTROSURGICAL) ×1 IMPLANT
GLOVE SS BIOGEL STRL SZ 7.5 (GLOVE) ×1 IMPLANT
GLOVE SUPERSENSE BIOGEL SZ 7.5 (GLOVE) ×2
GOWN STRL REUS W/ TWL LRG LVL3 (GOWN DISPOSABLE) ×2 IMPLANT
GOWN STRL REUS W/TWL LRG LVL3 (GOWN DISPOSABLE) ×4
HANDLE SINUS GUIDE LP (INSTRUMENTS) IMPLANT
HEMOSTAT SURGICEL .5X2 ABSORB (HEMOSTASIS) IMPLANT
HEMOSTAT SURGICEL 2X14 (HEMOSTASIS) IMPLANT
IV NS 500ML (IV SOLUTION) ×2
IV NS 500ML BAXH (IV SOLUTION) ×1 IMPLANT
NEEDLE PRECISIONGLIDE 27X1.5 (NEEDLE) ×3 IMPLANT
NEEDLE SPNL 25GX3.5 QUINCKE BL (NEEDLE) IMPLANT
NS IRRIG 1000ML POUR BTL (IV SOLUTION) ×3 IMPLANT
PACK BASIN DAY SURGERY FS (CUSTOM PROCEDURE TRAY) ×3 IMPLANT
PACK ENT DAY SURGERY (CUSTOM PROCEDURE TRAY) ×3 IMPLANT
PAD CLEANER CAUTERY TIP 5X5 (MISCELLANEOUS)
PATTIES SURGICAL .5 X3 (DISPOSABLE) ×3 IMPLANT
PENCIL BUTTON HOLSTER BLD 10FT (ELECTRODE) IMPLANT
SHEET SILASTIC 8X6X.030 25-30 (MISCELLANEOUS) IMPLANT
SLEEVE SCD COMPRESS KNEE MED (MISCELLANEOUS) ×3 IMPLANT
SOLUTION ANTI FOG 6CC (MISCELLANEOUS) ×3 IMPLANT
SPONGE GAUZE 2X2 8PLY STER LF (GAUZE/BANDAGES/DRESSINGS) ×1
SPONGE GAUZE 2X2 8PLY STRL LF (GAUZE/BANDAGES/DRESSINGS) ×2 IMPLANT
SUT CHROMIC 4 0 PS 2 18 (SUTURE) ×3 IMPLANT
SUT ETHILON 3 0 PS 1 (SUTURE) IMPLANT
SUT SILK 2 0 FS (SUTURE) ×3 IMPLANT
SUT VIC AB 4-0 P-3 18XBRD (SUTURE) IMPLANT
SUT VIC AB 4-0 P3 18 (SUTURE)
SYR 3ML 18GX1 1/2 (SYRINGE) IMPLANT
SYSTEM RELIEVA LUMA ILLUM (SINUPLASTY) IMPLANT
TOWEL OR 17X24 6PK STRL BLUE (TOWEL DISPOSABLE) ×3 IMPLANT
TRAY DSU PREP LF (CUSTOM PROCEDURE TRAY) ×3 IMPLANT
TUBE CONNECTING 20'X1/4 (TUBING) ×1
TUBE CONNECTING 20X1/4 (TUBING) ×2 IMPLANT
YANKAUER SUCT BULB TIP NO VENT (SUCTIONS) ×3 IMPLANT

## 2014-06-26 NOTE — Transfer of Care (Signed)
Immediate Anesthesia Transfer of Care Note  Patient: Regina Perry  Procedure(s) Performed: Procedure(s): BILATERAL NASAL SEPTOPLASTY WITH TURBINATE REDUCTION (Bilateral) BILATERAL ANTERIOR ETHMOIDECTOMY (Bilateral) BILATERAL MAXILLARY OSTEA ENLARGEMENT (Bilateral)  Patient Location: PACU  Anesthesia Type:General  Level of Consciousness: awake, alert  and oriented  Airway & Oxygen Therapy: Patient Spontanous Breathing and Patient connected to face mask oxygen  Post-op Assessment: Report given to RN and Post -op Vital signs reviewed and stable  Post vital signs: Reviewed and stable  Last Vitals:  Filed Vitals:   06/26/14 0917  BP: 128/74  Pulse: 70  Temp: 36.4 C  Resp: 20    Complications: No apparent anesthesia complications

## 2014-06-26 NOTE — Interval H&P Note (Signed)
History and Physical Interval Note:  06/26/2014 9:41 AM  Regina Perry  has presented today for surgery, with the diagnosis of SINUSITIS/TURBINATE HYPERTROPHY  The various methods of treatment have been discussed with the patient and family. After consideration of risks, benefits and other options for treatment, the patient has consented to  Procedure(s): BILATERAL NASAL SEPTOPLASTY WITH TURBINATE REDUCTION (Bilateral) BILATERAL ANTERIOR ETHMOIDECTOMY (Bilateral) BILATERAL MAXILLARY OSTIA ENLARGEMENT (Bilateral) as a surgical intervention .  The patient's history has been reviewed, patient examined, no change in status, stable for surgery.  I have reviewed the patient's chart and labs.  Questions were answered to the patient's satisfaction.     NEWMAN, CHRISTOPHER

## 2014-06-26 NOTE — Anesthesia Postprocedure Evaluation (Signed)
Anesthesia Post Note  Patient: Regina Perry  Procedure(s) Performed: Procedure(s) (LRB): BILATERAL NASAL SEPTOPLASTY WITH TURBINATE REDUCTION (Bilateral) BILATERAL ANTERIOR ETHMOIDECTOMY (Bilateral) BILATERAL MAXILLARY OSTEA ENLARGEMENT (Bilateral)  Anesthesia type: General  Patient location: PACU  Post pain: Pain level controlled and Adequate analgesia  Post assessment: Post-op Vital signs reviewed, Patient's Cardiovascular Status Stable, Respiratory Function Stable, Patent Airway and Pain level controlled  Last Vitals:  Filed Vitals:   06/26/14 1245  BP: 109/53  Pulse: 80  Temp:   Resp: 16    Post vital signs: Reviewed and stable  Level of consciousness: awake, alert  and oriented  Complications: No apparent anesthesia complications

## 2014-06-26 NOTE — Anesthesia Preprocedure Evaluation (Signed)
Anesthesia Evaluation  Patient identified by MRN, date of birth, ID band Patient awake    Reviewed: Allergy & Precautions, NPO status , Patient's Chart, lab work & pertinent test results  Airway Mallampati: II   Neck ROM: full    Dental   Pulmonary Current Smoker,          Cardiovascular hypertension,     Neuro/Psych  Headaches, Anxiety    GI/Hepatic GERD-  ,  Endo/Other    Renal/GU      Musculoskeletal   Abdominal   Peds  Hematology   Anesthesia Other Findings   Reproductive/Obstetrics                             Anesthesia Physical Anesthesia Plan  ASA: II  Anesthesia Plan: General   Post-op Pain Management:    Induction: Intravenous  Airway Management Planned: Oral ETT  Additional Equipment:   Intra-op Plan:   Post-operative Plan: Extubation in OR  Informed Consent: I have reviewed the patients History and Physical, chart, labs and discussed the procedure including the risks, benefits and alternatives for the proposed anesthesia with the patient or authorized representative who has indicated his/her understanding and acceptance.     Plan Discussed with: CRNA, Anesthesiologist and Surgeon  Anesthesia Plan Comments:         Anesthesia Quick Evaluation

## 2014-06-26 NOTE — Brief Op Note (Signed)
06/26/2014  11:48 AM  PATIENT:  [name] Horger  54 y.o. female  PRE-OPERATIVE DIAGNOSIS:  SINUSITIS/TURBINATE HYPERTROPHY  POST-OPERATIVE DIAGNOSIS:  SINUSITIS/TURBINATE HYPERTROPHY  PROCEDURE:  Procedure(s): BILATERAL NASAL SEPTOPLASTY WITH TURBINATE REDUCTION (Bilateral) BILATERAL ANTERIOR ETHMOIDECTOMY (Bilateral) BILATERAL MAXILLARY OSTEA ENLARGEMENT (Bilateral)  SURGEON:  Surgeon(s) and Role:    * Drema Halonhristopher E Alven Alverio, MD - Primary  PHYSICIAN ASSISTANT:   ASSISTANTS: none   ANESTHESIA:   general  EBL:     BLOOD ADMINISTERED:none  DRAINS: none   LOCAL MEDICATIONS USED:  XYLOCAINE with EPI 12 cc  SPECIMEN:  No Specimen  DISPOSITION OF SPECIMEN:  N/A  COUNTS:  YES  TOURNIQUET:  * No tourniquets in log *  DICTATION: .Other Dictation: Dictation Number [id number]  PLAN OF CARE: Discharge to home after PACU  PATIENT DISPOSITION:  PACU - hemodynamically stable.   Delay start of Pharmacological VTE agent (>24hrs) due to surgical blood loss or risk of bleeding: yes

## 2014-06-26 NOTE — Discharge Instructions (Addendum)
Take Keflex twice per day . Start tonight. Tylenol, motrin or hydrocodone 5 mg  1-2 every 6 hrs prn pain Apply cool compress to nose and elevate head of bed to reduce swelling and bleeding Return to see Dr Ezzard StandingNewman tomorrow at 4:15 to have nasal packs removed   Post Anesthesia Home Care Instructions  Activity: Get plenty of rest for the remainder of the day. A responsible adult should stay with you for 24 hours following the procedure.  For the next 24 hours, DO NOT: -Drive a car -Advertising copywriterperate machinery -Drink alcoholic beverages -Take any medication unless instructed by your physician -Make any legal decisions or sign important papers.  Meals: Start with liquid foods such as gelatin or soup. Progress to regular foods as tolerated. Avoid greasy, spicy, heavy foods. If nausea and/or vomiting occur, drink only clear liquids until the nausea and/or vomiting subsides. Call your physician if vomiting continues.  Special Instructions/Symptoms: Your throat may feel dry or sore from the anesthesia or the breathing tube placed in your throat during surgery. If this causes discomfort, gargle with warm salt water. The discomfort should disappear within 24 hours.  If you had a scopolamine patch placed behind your ear for the management of post- operative nausea and/or vomiting:  1. The medication in the patch is effective for 72 hours, after which it should be removed.  Wrap patch in a tissue and discard in the trash. Wash hands thoroughly with soap and water. 2. You may remove the patch earlier than 72 hours if you experience unpleasant side effects which may include dry mouth, dizziness or visual disturbances. 3. Avoid touching the patch. Wash your hands with soap and water after contact with the patch.

## 2014-06-26 NOTE — Anesthesia Procedure Notes (Signed)
Procedure Name: Intubation Date/Time: 06/26/2014 9:58 AM Performed by: Zenia ResidesPAYNE, Tashiya Souders D Pre-anesthesia Checklist: Patient identified, Emergency Drugs available, Suction available and Patient being monitored Patient Re-evaluated:Patient Re-evaluated prior to inductionOxygen Delivery Method: Circle System Utilized Preoxygenation: Pre-oxygenation with 100% oxygen Intubation Type: IV induction Ventilation: Mask ventilation without difficulty Laryngoscope Size: Mac and 3 Grade View: Grade I Tube type: Oral Number of attempts: 1 Airway Equipment and Method: Stylet and Oral airway Placement Confirmation: ETT inserted through vocal cords under direct vision,  positive ETCO2 and breath sounds checked- equal and bilateral Secured at: 23 cm Tube secured with: Tape Dental Injury: Teeth and Oropharynx as per pre-operative assessment

## 2014-06-27 NOTE — Op Note (Signed)
NAMERANDIE, BLOODGOOD NO.:  [id number]  MEDICAL RECORD NO.:  [id number]  LOCATION:                                 FACILITY:  PHYSICIAN:  Kristine Garbe. Ezzard Standing, M.D.DATE OF BIRTH:  1960/10/02  DATE OF PROCEDURE:  06/26/2014 DATE OF DISCHARGE:  06/26/2014                              OPERATIVE REPORT   PREOPERATIVE DIAGNOSES:  Septal deviation with nasal obstruction, turbinate hypertrophy with nasal obstruction.  Chronic recurring sinusitis.  POSTOPERATIVE DIAGNOSES:  Septal deviation with nasal obstruction, turbinate hypertrophy with nasal obstruction.  Chronic recurring sinusitis.  OPERATION PERFORMED:  Septoplasty with bilateral inferior turbinate reductions with Medtronic turbinate blade.  Functional endoscopic sinus surgery with bilateral anterior ethmoidectomy and bilateral maxillary ostial enlargements.  SURGEON:  Kristine Garbe. Ezzard Standing, M.D.  ANESTHESIA:  General endotracheal.  COMPLICATIONS:  None.  BRIEF CLINICAL NOTE:  Regina Perry is a 54 year old female who has had chronic problems with recurrent sinus infections and trouble breathing through her nose.  She has frequent headaches between her eyes and the forehead area as well as in the cheek area.  She has been on several rounds of antibiotics in the past because of recurrent sinus infections. On exam, she has a severe septal deviation to the left with a very narrowed left nasal passageway.  There are no polyps noted in the nasal cavity.  On CT scan, she has mild sinus disease in the ethmoid and maxillary area.  Frontal sinuses were clear as was the sphenoid sinuses. She is taken to operating room at this time for septoplasty and bilateral inferior turbinate reductions to help improve her breathing and limited to help reduce recurrent sinus infections.  DESCRIPTION OF PROCEDURE:  After adequate endotracheal anesthesia, the patient received 2 g of Ancef IV preoperatively.  Face was prepped  with Betadine solution and draped out with sterile towels.  The nose was then further prepped with cotton pledgets, soaked in Afrin, and the septum, turbinates, and middle meatus area was injected with Xylocaine with epinephrine for hemostasis.  First, the hemitransfixion incision was made along the caudal edge of septum on the left side. Mucoperichondrial and periosteal flaps elevated posteriorly.  She had a severe bony deviation to the left with a large left septal spur.  Just anterior to the bony septum, cartilage was incised vertically and mucoperichondrial and mucoperiosteal flaps were elevated on either side posterior to this vertical incision.  Some of the thickened septal bone that deviated to the left side was removed as was the large septal spur. This required use of the 4 mm osteotome.  After straightening the septum up, inferior turbinate reductions were performed with the Medtronic turbinate blade and bilaterally, the remaining turbinate, bone, and tissue was outfractured.  Hemostasis was obtained with suction cautery. Next, using the endoscopes, the right middle meatus was evaluated first. The patient had a large slightly laterally displaced middle turbinate, which was fractured medially.  The anterior ethmoid area was opened up including the concha bullosa and the uncinate process was incised. Maxillary ostia were identified and enlarged with straight through cut and back cutting forceps to about a cm size.  Next, procedure was repeated on the  left side.  Again, the middle turbinate was fractured medially.  The uncinate process was incised with sickle knife.  The anterior ethmoid area was opened up with a microdebrider.  The maxillary ostia were identified on the left side.  It was enlarged with backbiting and straight Thru-Cut forceps.  This completed the procedure on the left side.  The hemitransfixion incision was closed with interrupted 4-0 chromic sutures.  The septum  was basted with a chromic suture.  The nasal pore was placed in the middle meatus bilaterally.  The nose was then further packed with a Telfa-soaked bacitracin ointment bilaterally. This completed the procedure.  Regina Perry awoken from anesthesia and transferred to recovery room, postop doing well.  DISPOSITION:  She is discharged home later this afternoon on Keflex 5 mg b.i.d. for [redacted] week along with Tylenol, Motrin, or hydrocodone 5 mg tablets 1-2 q.6 h. p.r.n. pain.  We will have her follow up in my office tomorrow to have her nasal packs removed.          ______________________________ Kristine Garbehristopher E. Ezzard StandingNewman, M.D.     CEN/MEDQ  D:  06/26/2014  T:  06/27/2014  Job:  [id number]

## 2015-04-29 ENCOUNTER — Ambulatory Visit: Payer: BLUE CROSS/BLUE SHIELD | Admitting: Neurology

## 2015-04-29 ENCOUNTER — Telehealth: Payer: Self-pay

## 2015-04-29 NOTE — Telephone Encounter (Signed)
Patient canceled appointment the day of the appointment.  

## 2015-05-06 ENCOUNTER — Encounter: Payer: Self-pay | Admitting: Neurology

## 2015-05-19 ENCOUNTER — Ambulatory Visit (INDEPENDENT_AMBULATORY_CARE_PROVIDER_SITE_OTHER): Payer: BLUE CROSS/BLUE SHIELD | Admitting: Neurology

## 2015-05-19 ENCOUNTER — Encounter: Payer: Self-pay | Admitting: Neurology

## 2015-05-19 VITALS — BP 140/88 | HR 80 | Ht 63.0 in | Wt 134.0 lb

## 2015-05-19 DIAGNOSIS — M5432 Sciatica, left side: Secondary | ICD-10-CM | POA: Diagnosis not present

## 2015-05-19 DIAGNOSIS — M5431 Sciatica, right side: Secondary | ICD-10-CM | POA: Insufficient documentation

## 2015-05-19 MED ORDER — GABAPENTIN 100 MG PO CAPS: ORAL_CAPSULE | ORAL | Status: DC

## 2015-05-19 NOTE — Progress Notes (Signed)
Reason for visit: Low back pain, bilateral leg pain  Referring physician: Dr. Adline Mango Regina Perry is a 55 y.o. female  History of present illness:  Regina Perry is a 55 year old white female with a history of chronic low back pain. The patient had a fall in December 2014 and hurt her low back. MRI evaluation of the low back at that time showed a disc herniation at the L5-S1 level with bilateral S1 nerve root impingement. The patient did not have surgery at that time, she did have an epidural steroid injection without much benefit. She has not been taking any medication for the back pain. The back pain has worsened recently, the patient will have tingling sensations down both legs to the feet, and she believes that there is some weakness, particularly on the right side. She also has some neck pain and some right shoulder discomfort and some discomfort down the right arm to the elbow. She denies any significant balance issues or difficulty controlling the bowels or the bladder. She has not had any falls. She has been given hydrocodone to take for the pain, but this results in too much nervousness on the medication, she cannot sleep. She comes to this office for an evaluation.  Past Medical History  Diagnosis Date  . Hypertension   . Bronchitis   . Hypercholesteremia   . H/O cesarean section   . Anxiety   . Migraine   . Pneumonia   . GERD (gastroesophageal reflux disease)     Past Surgical History  Procedure Laterality Date  . Abdominal hysterectomy      partial  . Cesarean section    . Tonsillectomy and adenoidectomy    . Tubal ligation    . Colonoscopy    . Shoulder arthroscopy w/ rotator cuff repair  6/15    right  . Nasal septoplasty w/ turbinoplasty Bilateral 06/26/2014    Procedure: BILATERAL NASAL SEPTOPLASTY WITH TURBINATE REDUCTION;  Surgeon: Drema Halon, MD;  Location: [facility];  Service: ENT;  Laterality: Bilateral;  . Ethmoidectomy Bilateral  06/26/2014    Procedure: BILATERAL ANTERIOR ETHMOIDECTOMY;  Surgeon: Drema Halon, MD;  Location: [facility];  Service: ENT;  Laterality: Bilateral;  . Maxillary antrostomy Bilateral 06/26/2014    Procedure: BILATERAL MAXILLARY OSTEA ENLARGEMENT;  Surgeon: Drema Halon, MD;  Location: [facility];  Service: ENT;  Laterality: Bilateral;    Family History  Problem Relation Age of Onset  . Breast cancer Paternal Grandmother   . Heart disease Mother     MI  . Irritable bowel syndrome Mother   . Migraines Mother   . Lung cancer Paternal Grandfather   . Colon cancer Neg Hx   . Esophageal cancer Neg Hx   . Rectal cancer Neg Hx   . Stomach cancer Neg Hx   . Cerebral aneurysm Father   . AAA (abdominal aortic aneurysm) Sister     Social history:  reports that she has been smoking Cigarettes.  She has a 35 pack-year smoking history. She has never used smokeless tobacco. She reports that she does not drink alcohol or use illicit drugs.  Medications:  Prior to Admission medications   Medication Sig Start Date End Date Taking? Authorizing Provider  ergocalciferol (VITAMIN D2) 50000 UNITS capsule Take 50,000 Units by mouth once a week. Usually on Monday   Yes Historical Provider, MD  HYDROcodone-acetaminophen (NORCO/VICODIN) 5-325 MG per tablet Take 1-2 tablets by mouth every 6 (  six) hours as needed for moderate pain. 06/26/14  Yes Drema Halon, MD  lovastatin (MEVACOR) 20 MG tablet Take 20 mg by mouth at bedtime.   Yes Historical Provider, MD  promethazine (PHENERGAN) 25 MG tablet Take 25 mg by mouth as needed for nausea (nausea associated with Migraine Headaches).    Yes Historical Provider, MD  SUMAtriptan (IMITREX) 100 MG tablet Take 100 mg by mouth every 2 (two) hours as needed. For migraines   Yes Historical Provider, MD  valsartan (DIOVAN) 80 MG tablet Take 80 mg by mouth daily. 04/21/15  Yes Historical Provider, MD     No Known  Allergies  ROS:  Out of a complete 14 system review of symptoms, the patient complains only of the following symptoms, and all other reviewed systems are negative.  Joint pain, muscle cramps Headache Anxiety Restless legs  Blood pressure 140/88, pulse 80, height  (1.6 m), weight 134 lb (60.782 kg).  Physical Exam  General: The patient is alert and cooperative at the time of the examination.  Eyes: Pupils are equal, round, and reactive to light. Discs are flat bilaterally.  Neck: The neck is supple, no carotid bruits are noted.  Respiratory: The respiratory examination is clear.  Cardiovascular: The cardiovascular examination reveals a regular rate and rhythm, no obvious murmurs or rubs are noted.  Neuromuscular: Range of movement of the low back is relatively full, lacking only about 10 of full flexion.  Skin: Extremities are without significant edema.  Neurologic Exam  Mental status: The patient is alert and oriented x 3 at the time of the examination. The patient has apparent normal recent and remote memory, with an apparently normal attention span and concentration ability.  Cranial nerves: Facial symmetry is present. There is good sensation of the face to pinprick and soft touch bilaterally. The strength of the facial muscles and the muscles to head turning and shoulder shrug are normal bilaterally. Speech is well enunciated, no aphasia or dysarthria is noted. Extraocular movements are full. Visual fields are full. The tongue is midline, and the patient has symmetric elevation of the soft palate. No obvious hearing deficits are noted.  Motor: The motor testing reveals 5 over 5 strength of all 4 extremities. Good symmetric motor tone is noted throughout. The patient is able to walk on heels and the toes bilaterally.  Sensory: Sensory testing is intact to pinprick, soft touch, vibration sensation, and position sense on all 4 extremities. No evidence of extinction is  noted.  Coordination: Cerebellar testing reveals good finger-nose-finger and heel-to-shin bilaterally.  Gait and station: Gait is normal. Tandem gait is normal. Romberg is negative. No drift is seen.  Reflexes: Deep tendon reflexes are symmetric and normal bilaterally, with exception that there is absence of the right ankle jerk reflex. Toes are downgoing bilaterally.   MRI lumbar 02/04/13:  IMPRESSION: S1 is transitional.  Broad-based herniation at L5-S1. Facet and ligamentous hypertrophy. Stenosis of both subarticular lateral recesses that could compress either or both S1 nerve roots.    Assessment/Plan:  1. Bilateral sciatica  The patient likely has right greater than left S1 nerve root impingement. The patient will be sent for MRI of the lumbar spine. She will be placed on low-dose gabapentin at this time. She is to call if she needs a dose adjustment. She will follow-up in 4 months. If the medication does not offer benefit, she may be referred for surgical consideration.  Marlan Palau MD 05/19/2015 7:18 PM  [facility] [address] [place], [place] [address]  Phone [phone] Fax [phone]

## 2015-05-19 NOTE — Patient Instructions (Signed)
  We will get MRI evaluation of the low back and try gabapentin for the low back pain.  Neurontin (gabapentin) may result in drowsiness, ankle swelling, gait instability, or possibly dizziness. Please contact our office if significant side effects occur with this medication.  Radicular Pain Radicular pain in either the arm or leg is usually from a bulging or herniated disk in the spine. A piece of the herniated disk may press against the nerves as the nerves exit the spine. This causes pain which is felt at the tips of the nerves down the arm or leg. Other causes of radicular pain may include:  Fractures.  Heart disease.  Cancer.  An abnormal and usually degenerative state of the nervous system or nerves (neuropathy). Diagnosis may require CT or MRI scanning to determine the primary cause.  Nerves that start at the neck (nerve roots) may cause radicular pain in the outer shoulder and arm. It can spread down to the thumb and fingers. The symptoms vary depending on which nerve root has been affected. In most cases radicular pain improves with conservative treatment. Neck problems may require physical therapy, a neck collar, or cervical traction. Treatment may take many weeks, and surgery may be considered if the symptoms do not improve.  Conservative treatment is also recommended for sciatica. Sciatica causes pain to radiate from the lower back or buttock area down the leg into the foot. Often there is a history of back problems. Most patients with sciatica are better after 2 to 4 weeks of rest and other supportive care. Short term bed rest can reduce the disk pressure considerably. Sitting, however, is not a good position since this increases the pressure on the disk. You should avoid bending, lifting, and all other activities which make the problem worse. Traction can be used in severe cases. Surgery is usually reserved for patients who do not improve within the first months of treatment. Only take  over-the-counter or prescription medicines for pain, discomfort, or fever as directed by your caregiver. Narcotics and muscle relaxants may help by relieving more severe pain and spasm and by providing mild sedation. Cold or massage can give significant relief. Spinal manipulation is not recommended. It can increase the degree of disc protrusion. Epidural steroid injections are often effective treatment for radicular pain. These injections deliver medicine to the spinal nerve in the space between the protective covering of the spinal cord and back bones (vertebrae). Your caregiver can give you more information about steroid injections. These injections are most effective when given within two weeks of the onset of pain.  You should see your caregiver for follow up care as recommended. A program for neck and back injury rehabilitation with stretching and strengthening exercises is an important part of management.  SEEK IMMEDIATE MEDICAL CARE IF:  You develop increased pain, weakness, or numbness in your arm or leg.  You develop difficulty with bladder or bowel control.  You develop abdominal pain.   This information is not intended to replace advice given to you by your health care provider. Make sure you discuss any questions you have with your health care provider.   Document Released: 04/21/2004 Document Revised: 04/04/2014 Document Reviewed: 10/08/2014 Elsevier Interactive Patient Education [employer]

## 2015-06-10 ENCOUNTER — Ambulatory Visit (INDEPENDENT_AMBULATORY_CARE_PROVIDER_SITE_OTHER): Payer: BLUE CROSS/BLUE SHIELD

## 2015-06-10 DIAGNOSIS — M5432 Sciatica, left side: Secondary | ICD-10-CM | POA: Diagnosis not present

## 2015-06-10 DIAGNOSIS — M5431 Sciatica, right side: Secondary | ICD-10-CM | POA: Diagnosis not present

## 2015-06-12 ENCOUNTER — Telehealth: Payer: Self-pay | Admitting: Neurology

## 2015-06-12 MED ORDER — PREDNISONE 10 MG PO TABS: ORAL_TABLET | ORAL | Status: DC

## 2015-06-12 NOTE — Telephone Encounter (Signed)
I called patient. The MRI of the low back shows no change in 2014. There appears be some mild neuroforaminal stenosis that may affect the S1 nerve roots, but this does not appear to be severe. I will try the patient on a prednisone Dosepak, epidural steroids previous it did not help. If this is not beneficial, we may consider EMG evaluation of the legs.   MRI lumbar 06/11/2015:  IMPRESSION:  Abnormal MRI lumbar spine (without) demonstrating: 1. At L5-S1: disc bulging and facet and ligamentum flavum hypertrophy with mild spinal stenosis and mild biforaminal and lateral recess stenosis; potential impingement upon descending bilateral S1 roots. 2. 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 Lowest intervertebral disc space is numbered S1-S2. Correlate numbering with xray if intervention is planned.  3. No significant change from MRI on 02/04/13.

## 2015-06-25 ENCOUNTER — Emergency Department (HOSPITAL_COMMUNITY)
Admission: EM | Admit: 2015-06-25 | Discharge: 2015-06-25 | Disposition: A | Discharge status: Home / Self Care | Payer: BLUE CROSS/BLUE SHIELD | Attending: Emergency Medicine | Admitting: Emergency Medicine

## 2015-06-25 ENCOUNTER — Encounter (HOSPITAL_COMMUNITY): Payer: Self-pay

## 2015-06-25 ENCOUNTER — Emergency Department (HOSPITAL_COMMUNITY): Payer: BLUE CROSS/BLUE SHIELD

## 2015-06-25 DIAGNOSIS — Z79899 Other long term (current) drug therapy: Secondary | ICD-10-CM | POA: Diagnosis not present

## 2015-06-25 DIAGNOSIS — G43909 Migraine, unspecified, not intractable, without status migrainosus: Secondary | ICD-10-CM | POA: Diagnosis not present

## 2015-06-25 DIAGNOSIS — Z8709 Personal history of other diseases of the respiratory system: Secondary | ICD-10-CM | POA: Insufficient documentation

## 2015-06-25 DIAGNOSIS — R202 Paresthesia of skin: Secondary | ICD-10-CM | POA: Insufficient documentation

## 2015-06-25 DIAGNOSIS — Z8701 Personal history of pneumonia (recurrent): Secondary | ICD-10-CM | POA: Insufficient documentation

## 2015-06-25 DIAGNOSIS — R0602 Shortness of breath: Secondary | ICD-10-CM | POA: Insufficient documentation

## 2015-06-25 DIAGNOSIS — R05 Cough: Secondary | ICD-10-CM | POA: Diagnosis not present

## 2015-06-25 DIAGNOSIS — F17210 Nicotine dependence, cigarettes, uncomplicated: Secondary | ICD-10-CM | POA: Diagnosis not present

## 2015-06-25 DIAGNOSIS — F419 Anxiety disorder, unspecified: Secondary | ICD-10-CM | POA: Insufficient documentation

## 2015-06-25 DIAGNOSIS — I10 Essential (primary) hypertension: Secondary | ICD-10-CM | POA: Diagnosis not present

## 2015-06-25 DIAGNOSIS — Z8719 Personal history of other diseases of the digestive system: Secondary | ICD-10-CM | POA: Insufficient documentation

## 2015-06-25 DIAGNOSIS — E7800 Pure hypercholesterolemia, unspecified: Secondary | ICD-10-CM | POA: Insufficient documentation

## 2015-06-25 DIAGNOSIS — R079 Chest pain, unspecified: Secondary | ICD-10-CM | POA: Diagnosis present

## 2015-06-25 LAB — BASIC METABOLIC PANEL
Anion gap: 13 (ref 5–15)
BUN: 12 mg/dL (ref 6–20)
CO2: 20 mmol/L — ABNORMAL LOW (ref 22–32)
Calcium: 9 mg/dL (ref 8.9–10.3)
Chloride: 107 mmol/L (ref 101–111)
Creatinine, Ser: 1.02 mg/dL — ABNORMAL HIGH (ref 0.44–1.00)
GFR calc Af Amer: >60 mL/min (ref >60)
GFR calc non Af Amer: >60 mL/min (ref >60)
Glucose, Bld: 118 mg/dL — ABNORMAL HIGH (ref 65–99)
Potassium: 3.7 mmol/L (ref 3.5–5.1)
Sodium: 140 mmol/L (ref 135–145)

## 2015-06-25 LAB — I-STAT TROPONIN, ED
Troponin i, poc: 0 ng/mL (ref 0.00–0.08)
Troponin i, poc: 0 ng/mL (ref 0.00–0.08)

## 2015-06-25 LAB — CBC
HCT: 42.2 % (ref 36.0–46.0)
Hemoglobin: 14.2 g/dL (ref 12.0–15.0)
MCH: 29.6 pg (ref 26.0–34.0)
MCHC: 33.6 g/dL (ref 30.0–36.0)
MCV: 88.1 fL (ref 78.0–100.0)
Platelets: 235 10*3/uL (ref 150–400)
RBC: 4.79 MIL/uL (ref 3.87–5.11)
RDW: 13 % (ref 11.5–15.5)
WBC: 11.9 10*3/uL — ABNORMAL HIGH (ref 4.0–10.5)

## 2015-06-25 MED ORDER — SODIUM CHLORIDE 0.9 % IV BOLUS (SEPSIS)
1000.0000 mL | Freq: Once | INTRAVENOUS | Status: AC
  Administered 2015-06-25: 1000 mL via INTRAVENOUS

## 2015-06-25 MED ORDER — MORPHINE SULFATE (PF) 4 MG/ML IV SOLN
4.0000 mg | Freq: Once | INTRAVENOUS | Status: AC
  Administered 2015-06-25: 4 mg via INTRAVENOUS
  Filled 2015-06-25: qty 1

## 2015-06-25 MED ORDER — GI COCKTAIL ~~LOC~~
30.0000 mL | Freq: Once | ORAL | Status: AC
  Administered 2015-06-25: 30 mL via ORAL
  Filled 2015-06-25: qty 30

## 2015-06-25 NOTE — ED Provider Notes (Signed)
CSN: [id number]     Arrival date & time 06/25/15  1523 History   First MD Initiated Contact with Patient 06/25/15 1530     Chief Complaint  Patient presents with  . Chest Pain     (Consider location/radiation/quality/duration/timing/severity/associated sxs/prior Treatment) HPI Comments: Patient with a history of HTN, Hypercholesterolemia, Anxiety, and GERD presents today with chest pain.  She states onset of pain around 1:30 PM while playing on her phone.  She reports that the pain radiated to her face and throat.  She states that the pain has been constant since that time.  She describes the pain as a heaviness.  Pain not associated with exertion or eating.  She reports that the pain caused a "pain attack".  She had some tingling of both hands and also some perioral numbness/tingling.  She reported some SOB associated with the panic attack, but states that this has resolved at this time.  She took three 325 mg ASA for the pain, which did not help.  She was also given NG x 4 by EMS en route, which provided mild improvement in pain.  She does report associated cough over the past week.  She denies hemoptysis, numbness, tingling, nausea, vomiting, abdominal pain, dizziness, LE edema, or syncope.  She denies any prior cardiac history.  No history of DM.  She does have a history of HTN, Hyperlipidemia, and does smoke 1 ppd.  She also reports that her mother had a MI at the age of 42.  No history of PE or DVT.  No prolonged travel or surgeries in the past 4 weeks.  No exogenous estrogen use.   The history is provided by the patient.    Past Medical History  Diagnosis Date  . Hypertension   . Bronchitis   . Hypercholesteremia   . H/O cesarean section   . Anxiety   . Migraine   . Pneumonia   . GERD (gastroesophageal reflux disease)    Past Surgical History  Procedure Laterality Date  . Abdominal hysterectomy      partial  . Cesarean section    . Tonsillectomy and adenoidectomy    . Tubal  ligation    . Colonoscopy    . Shoulder arthroscopy w/ rotator cuff repair  6/15    right  . Nasal septoplasty w/ turbinoplasty Bilateral 06/26/2014    Procedure: BILATERAL NASAL SEPTOPLASTY WITH TURBINATE REDUCTION;  Surgeon: Drema Halon, MD;  Location: [facility];  Service: ENT;  Laterality: Bilateral;  . Ethmoidectomy Bilateral 06/26/2014    Procedure: BILATERAL ANTERIOR ETHMOIDECTOMY;  Surgeon: Drema Halon, MD;  Location: [facility];  Service: ENT;  Laterality: Bilateral;  . Maxillary antrostomy Bilateral 06/26/2014    Procedure: BILATERAL MAXILLARY OSTEA ENLARGEMENT;  Surgeon: Drema Halon, MD;  Location: [facility];  Service: ENT;  Laterality: Bilateral;   Family History  Problem Relation Age of Onset  . Breast cancer Paternal Grandmother   . Heart disease Mother     MI  . Irritable bowel syndrome Mother   . Migraines Mother   . Lung cancer Paternal Grandfather   . Colon cancer Neg Hx   . Esophageal cancer Neg Hx   . Rectal cancer Neg Hx   . Stomach cancer Neg Hx   . Cerebral aneurysm Father   . AAA (abdominal aortic aneurysm) Sister    Social History  Substance Use Topics  . Smoking status: Current Every Day Smoker -- 1.00 packs/day for 35  years    Types: Cigarettes  . Smokeless tobacco: Never Used  . Alcohol Use: No   OB History    No data available     Review of Systems  All other systems reviewed and are negative.     Allergies  Review of patient's allergies indicates no known allergies.  Home Medications   Prior to Admission medications   Medication Sig Start Date End Date Taking? Authorizing Provider  ergocalciferol (VITAMIN D2) 50000 UNITS capsule Take 50,000 Units by mouth once a week. Usually on Monday    Historical Provider, MD  gabapentin (NEURONTIN) 100 MG capsule One capsule three times a day for one week, then take 2 capsules three times a day 05/19/15   York Spaniel, MD   HYDROcodone-acetaminophen (NORCO/VICODIN) 5-325 MG per tablet Take 1-2 tablets by mouth every 6 (six) hours as needed for moderate pain. 06/26/14   Drema Halon, MD  lovastatin (MEVACOR) 20 MG tablet Take 20 mg by mouth at bedtime.    Historical Provider, MD  predniSONE (DELTASONE) 10 MG tablet Begin taking 6 tablets daily, taper by one tablet every other day until off the medication. 06/12/15   York Spaniel, MD  promethazine (PHENERGAN) 25 MG tablet Take 25 mg by mouth as needed for nausea (nausea associated with Migraine Headaches).     Historical Provider, MD  SUMAtriptan (IMITREX) 100 MG tablet Take 100 mg by mouth every 2 (two) hours as needed. For migraines    Historical Provider, MD  valsartan (DIOVAN) 80 MG tablet Take 80 mg by mouth daily. 04/21/15   Historical Provider, MD   BP 138/74 mmHg  Pulse 77  Temp(Src) 98.2 F (36.8 C) (Oral)  Resp 20  SpO2 100% Physical Exam  Constitutional: She appears well-developed and well-nourished. No distress.  HENT:  Head: Normocephalic and atraumatic.  Mouth/Throat: Oropharynx is clear and moist.  Neck: Normal range of motion. Neck supple.  Cardiovascular: Normal rate, regular rhythm and normal heart sounds.   Pulses:      Dorsalis pedis pulses are 2+ on the right side, and 2+ on the left side.  Pulmonary/Chest: Effort normal and breath sounds normal. No respiratory distress. She has no wheezes. She has no rales. She exhibits tenderness.  Abdominal: Soft. Bowel sounds are normal. She exhibits no distension and no mass. There is no tenderness. There is no rebound and no guarding.  Musculoskeletal: Normal range of motion. She exhibits no edema.  Neurological: She is alert.  Skin: Skin is warm and dry. She is not diaphoretic.  Psychiatric: She has a normal mood and affect.  Nursing note and vitals reviewed.   ED Course  Procedures (including critical care time) Labs Review Labs Reviewed  BASIC METABOLIC PANEL  CBC  I-STAT  TROPOININ, ED    Imaging Review Dg Chest 2 View  06/25/2015  CLINICAL DATA:  Chest pain EXAM: CHEST  2 VIEW COMPARISON:  02/14/2013 chest radiograph. FINDINGS: Stable cardiomediastinal silhouette with normal heart size. No pneumothorax. No pleural effusion. Lungs appear clear, with no acute consolidative airspace disease and no pulmonary edema. IMPRESSION: No active cardiopulmonary disease. Electronically Signed   By: Delbert Phenix M.D.   On: 06/25/2015 16:04   I have personally reviewed and evaluated these images and lab results as part of my medical decision-making.   EKG Interpretation   Date/Time:  Thursday June 25 2015 15:25:23 EDT Ventricular Rate:  90 PR Interval:  135 QRS Duration: 97 QT Interval:  369 QTC Calculation: 451  R Axis:   -5 Text Interpretation:  Sinus rhythm RSR' in V1 or V2, probably normal  variant No significant change since last tracing Confirmed by [facility] Hospital[facility]UNKETT   MD, Alphonzo LemmingsWHITNEY ([id number]) on 06/25/2015 3:49:40 PM     8:11 PM Reassessed patient.  She reports improvement in symptoms at this time. MDM   Final diagnoses:  None   Patient presents today with atypical chest pain.  Pain not associated with exertion.  Chest wall tender to palpation.  Patient radiates to her throat, which is more consistent with GERD.  However, she denies eating anytime recently.  Pain did also improve with GI cocktail.   Minimal improvement with NG given by EMS.  No ischemic changes on EKG.  Initial and delta troponin are negative.  No hypoxia or tachycardia.  No prolonged travel or surgeries in the past 4 weeks.  No LE edema.  No exogenous estrogen use. Therefore, feel that PE is unlikely.  CXR is negative.  Feel that the patient is stable for discharge.  Strict return precautions given.      Santiago GladHeather Bethlehem Langstaff, PA-C 06/27/15 1452  Gwyneth SproutWhitney Plunkett, MD 06/29/15 [phone]

## 2015-06-25 NOTE — ED Notes (Signed)
PER EMS: Pt from home with report of tight central and left sided CP radiating to left side of neck; onset today at 1345. Pt took three 325 aspirin tablets. Hx of anxiety. EMS adm 4 nitro over the course of 45 minutes. BP: 112/72, HR-87, RR-18, O2-98% RA.

## 2015-08-12 ENCOUNTER — Emergency Department (HOSPITAL_COMMUNITY)
Admission: EM | Admit: 2015-08-12 | Discharge: 2015-08-12 | Disposition: A | Discharge status: Home / Self Care | Payer: BLUE CROSS/BLUE SHIELD | Attending: Emergency Medicine | Admitting: Emergency Medicine

## 2015-08-12 ENCOUNTER — Emergency Department (HOSPITAL_COMMUNITY): Payer: BLUE CROSS/BLUE SHIELD

## 2015-08-12 ENCOUNTER — Encounter (HOSPITAL_COMMUNITY): Payer: Self-pay | Admitting: Vascular Surgery

## 2015-08-12 DIAGNOSIS — Z79899 Other long term (current) drug therapy: Secondary | ICD-10-CM | POA: Insufficient documentation

## 2015-08-12 DIAGNOSIS — F17210 Nicotine dependence, cigarettes, uncomplicated: Secondary | ICD-10-CM | POA: Insufficient documentation

## 2015-08-12 DIAGNOSIS — E7800 Pure hypercholesterolemia, unspecified: Secondary | ICD-10-CM | POA: Insufficient documentation

## 2015-08-12 DIAGNOSIS — F419 Anxiety disorder, unspecified: Secondary | ICD-10-CM | POA: Diagnosis not present

## 2015-08-12 DIAGNOSIS — S9031XA Contusion of right foot, initial encounter: Secondary | ICD-10-CM | POA: Insufficient documentation

## 2015-08-12 DIAGNOSIS — S92351A Displaced fracture of fifth metatarsal bone, right foot, initial encounter for closed fracture: Secondary | ICD-10-CM | POA: Diagnosis not present

## 2015-08-12 DIAGNOSIS — K219 Gastro-esophageal reflux disease without esophagitis: Secondary | ICD-10-CM | POA: Insufficient documentation

## 2015-08-12 DIAGNOSIS — S99921A Unspecified injury of right foot, initial encounter: Secondary | ICD-10-CM | POA: Diagnosis present

## 2015-08-12 DIAGNOSIS — Y9289 Other specified places as the place of occurrence of the external cause: Secondary | ICD-10-CM | POA: Diagnosis not present

## 2015-08-12 DIAGNOSIS — I10 Essential (primary) hypertension: Secondary | ICD-10-CM | POA: Diagnosis not present

## 2015-08-12 DIAGNOSIS — Z8709 Personal history of other diseases of the respiratory system: Secondary | ICD-10-CM | POA: Insufficient documentation

## 2015-08-12 DIAGNOSIS — S92301A Fracture of unspecified metatarsal bone(s), right foot, initial encounter for closed fracture: Secondary | ICD-10-CM

## 2015-08-12 DIAGNOSIS — Y998 Other external cause status: Secondary | ICD-10-CM | POA: Diagnosis not present

## 2015-08-12 DIAGNOSIS — Y9339 Activity, other involving climbing, rappelling and jumping off: Secondary | ICD-10-CM | POA: Diagnosis not present

## 2015-08-12 DIAGNOSIS — X501XXA Overexertion from prolonged static or awkward postures, initial encounter: Secondary | ICD-10-CM | POA: Insufficient documentation

## 2015-08-12 DIAGNOSIS — Z7982 Long term (current) use of aspirin: Secondary | ICD-10-CM | POA: Insufficient documentation

## 2015-08-12 DIAGNOSIS — G43909 Migraine, unspecified, not intractable, without status migrainosus: Secondary | ICD-10-CM | POA: Insufficient documentation

## 2015-08-12 DIAGNOSIS — M79672 Pain in left foot: Secondary | ICD-10-CM

## 2015-08-12 DIAGNOSIS — Z8701 Personal history of pneumonia (recurrent): Secondary | ICD-10-CM | POA: Insufficient documentation

## 2015-08-12 MED ORDER — HYDROCODONE-ACETAMINOPHEN 5-325 MG PO TABS
1.0000 | ORAL_TABLET | Freq: Once | ORAL | Status: AC
  Administered 2015-08-12: 1 via ORAL
  Filled 2015-08-12: qty 1

## 2015-08-12 MED ORDER — HYDROCODONE-ACETAMINOPHEN 5-325 MG PO TABS
1.0000 | ORAL_TABLET | Freq: Four times a day (QID) | ORAL | Status: DC | PRN
Start: 2015-08-12 — End: 2015-09-17

## 2015-08-12 MED ORDER — NAPROXEN 500 MG PO TABS: 500.0000 mg | ORAL_TABLET | Freq: Two times a day (BID) | ORAL | Status: DC | PRN

## 2015-08-12 NOTE — Discharge Instructions (Signed)
Wear leg splint until you see the orthopedist. Use crutches at all times for all weight bearing activities. Ice and elevate foot throughout the day. Alternate between naprosyn and norco for pain relief. Do not drive or operate machinery with pain medication use. Call orthopedic follow up today or tomorrow to schedule followup appointment for recheck and ongoing management of your fracture in 1 week. Return to the ER for changes or worsening symptoms.    Metatarsal Fracture A metatarsal fracture is a break in a metatarsal bone. Metatarsal bones connect your toe bones to your ankle bones. CAUSES This type of fracture may be caused by:  A sudden twisting of your foot.  A fall onto your foot.  Overuse or repetitive exercise. RISK FACTORS This condition is more likely to develop in people who:  Play contact sports.  Have a bone disease.  Have a low calcium level. SYMPTOMS Symptoms of this condition include:  Pain that is worse when walking or standing.  Pain when pressing on the foot or moving the toes.  Swelling.  Bruising on the top or bottom of the foot.  A foot that appears shorter than the other one. DIAGNOSIS This condition is diagnosed with a physical exam. You may also have imaging tests, such as:  X-rays.  A CT scan.  MRI. TREATMENT Treatment for this condition depends on its severity and whether a bone has moved out of place. Treatment may involve:  Rest.  Wearing foot support such as a cast, splint, or boot for several weeks.  Using crutches.  Surgery to move bones back into the right position. Surgery is usually needed if there are many pieces of broken bone or bones that are very out of place (displaced fracture).  Physical therapy. This may be needed to help you regain full movement and strength in your foot. You will need to return to your health care provider to have X-rays taken until your bones heal. Your health care provider will look at the X-rays  to make sure that your foot is healing well. HOME CARE INSTRUCTIONS  If You Have a Cast:  Do not stick anything inside the cast to scratch your skin. Doing that increases your risk of infection.  Check the skin around the cast every day. Report any concerns to your health care provider. You may put lotion on dry skin around the edges of the cast. Do not apply lotion to the skin underneath the cast.  Keep the cast clean and dry. If You Have a Splint or a Supportive Boot:  Wear it as directed by your health care provider. Remove it only as directed by your health care provider.  Loosen it if your toes become numb and tingle, or if they turn cold and blue.  Keep it clean and dry. Bathing  Do not take baths, swim, or use a hot tub until your health care provider approves. Ask your health care provider if you can take showers. You may only be allowed to take sponge baths for bathing.  If your health care provider approves bathing and showering, cover the cast or splint with a watertight plastic bag to protect it from water. Do not let the cast or splint get wet. Managing Pain, Stiffness, and Swelling  If directed, apply ice to the injured area (if you have a splint, not a cast).  Put ice in a plastic bag.  Place a towel between your skin and the bag.  Leave the ice on for 20 minutes,  2-3 times per day.  Move your toes often to avoid stiffness and to lessen swelling.  Raise (elevate) the injured area above the level of your heart while you are sitting or lying down. Driving  Do not drive or operate heavy machinery while taking pain medicine.  Do not drive while wearing foot support on a foot that you use for driving. Activity  Return to your normal activities as directed by your health care provider. Ask your health care provider what activities are safe for you.  Perform exercises as directed by your health care provider or physical therapist. Safety  Do not use the injured  foot to support your body weight until your health care provider says that you can. Use crutches as directed by your health care provider. General Instructions  Do not put pressure on any part of the cast or splint until it is fully hardened. This may take several hours.  Do not use any tobacco products, including cigarettes, chewing tobacco, or e-cigarettes. Tobacco can delay bone healing. If you need help quitting, ask your health care provider.  Take medicines only as directed by your health care provider.  Keep all follow-up visits as directed by your health care provider. This is important. SEEK MEDICAL CARE IF:  You have a fever.  Your cast, splint, or boot is too loose or too tight.  Your cast, splint, or boot is damaged.  Your pain medicine is not helping.  You have pain, tingling, or numbness in your foot that is not going away. SEEK IMMEDIATE MEDICAL CARE IF:  You have severe pain.  You have tingling or numbness in your foot that is getting worse.  Your foot feels cold or becomes numb.  Your foot changes color.   This information is not intended to replace advice given to you by your health care provider. Make sure you discuss any questions you have with your health care provider.   Document Released: 12/04/2001 Document Revised: 07/29/2014 Document Reviewed: 01/08/2014 Elsevier Interactive Patient Education 2016 [employer]  Cryotherapy Cryotherapy is when you put ice on your injury. Ice helps lessen pain and puffiness (swelling) after an injury. Ice works the best when you start using it in the first 24 to 48 hours after an injury. HOME CARE  Put a dry or damp towel between the ice pack and your skin.  You may press gently on the ice pack.  Leave the ice on for no more than 10 to 20 minutes at a time.  Check your skin after 5 minutes to make sure your skin is okay.  Rest at least 20 minutes between ice pack uses.  Stop using ice when your skin loses  feeling (numbness).  Do not use ice on someone who cannot tell you when it hurts. This includes small children and people with memory problems (dementia). GET HELP RIGHT AWAY IF:  You have white spots on your skin.  Your skin turns blue or pale.  Your skin feels waxy or hard.  Your puffiness gets worse. MAKE SURE YOU:   Understand these instructions.  Will watch your condition.  Will get help right away if you are not doing well or get worse.   This information is not intended to replace advice given to you by your health care provider. Make sure you discuss any questions you have with your health care provider.   Document Released: 08/31/2007 Document Revised: 06/06/2011 Document Reviewed: 11/04/2010 Elsevier Interactive Patient Education 2016 [employer].  Cast  or Splint Care Casts and splints support injured limbs and keep bones from moving while they heal.  HOME CARE  Keep the cast or splint uncovered during the drying period.  A plaster cast can take 24 to 48 hours to dry.  A fiberglass cast will dry in less than 1 hour.  Do not rest the cast on anything harder than a pillow for 24 hours.  Do not put weight on your injured limb. Do not put pressure on the cast. Wait for your doctor's approval.  Keep the cast or splint dry.  Cover the cast or splint with a plastic bag during baths or wet weather.  If you have a cast over your chest and belly (trunk), take sponge baths until the cast is taken off.  If your cast gets wet, dry it with a towel or blow dryer. Use the cool setting on the blow dryer.  Keep your cast or splint clean. Wash a dirty cast with a damp cloth.  Do not put any objects under your cast or splint.  Do not scratch the skin under the cast with an object. If itching is a problem, use a blow dryer on a cool setting over the itchy area.  Do not trim or cut your cast.  Do not take out the padding from inside your cast.  Exercise your joints near  the cast as told by your doctor.  Raise (elevate) your injured limb on 1 or 2 pillows for the first 1 to 3 days. GET HELP IF:  Your cast or splint cracks.  Your cast or splint is too tight or too loose.  You itch badly under the cast.  Your cast gets wet or has a soft spot.  You have a bad smell coming from the cast.  You get an object stuck under the cast.  Your skin around the cast becomes red or sore.  You have new or more pain after the cast is put on. GET HELP RIGHT AWAY IF:  You have fluid leaking through the cast.  You cannot move your fingers or toes.  Your fingers or toes turn blue or white or are cool, painful, or puffy (swollen).  You have tingling or lose feeling (numbness) around the injured area.  You have bad pain or pressure under the cast.  You have trouble breathing or have shortness of breath.  You have chest pain.   This information is not intended to replace advice given to you by your health care provider. Make sure you discuss any questions you have with your health care provider.   Document Released: 07/14/2010 Document Revised: 11/14/2012 Document Reviewed: 09/20/2012 Elsevier Interactive Patient Education [employer]

## 2015-08-12 NOTE — ED Notes (Signed)
Pt reports to the ED for eval of right foot pain. Pt reports she was in the bouncy house with her grandchild and she believes she came down in the right foot wrong. Swelling and ecchymosis noted to the lateral aspect of her right foot. She was initially able to walk on it but states the pain has gotten progressively worse. Pt A&Ox4, resp e/u, and skin warm and dry.

## 2015-08-12 NOTE — ED Notes (Signed)
Ortho paged. 

## 2015-08-12 NOTE — ED Provider Notes (Signed)
CSN: [id number]     Arrival date & time 08/12/15  1709 History  By signing my name below, I, Iona Beard, attest that this documentation has been prepared under the direction and in the presence of [address] [employer].   Electronically Signed: Iona Beard, ED Scribe 08/12/2015 at 6:24 PM.  Chief Complaint  Patient presents with  . Foot Pain   Patient is a 55 y.o. female presenting with lower extremity pain. The history is provided by the patient. No language interpreter was used.  Foot Pain This is a new problem. The current episode started 6 to 12 hours ago. The problem occurs constantly. The problem has been gradually worsening. Pertinent negatives include no chest pain, no abdominal pain and no shortness of breath. The symptoms are aggravated by walking. Nothing relieves the symptoms. She has tried a cold compress for the symptoms. The treatment provided no relief.   HPI Comments: DARLY MASSI is a 55 y.o. female who presents to the Emergency Department complaining of sudden onset, gradually worsening, constant, 10/10, throbbing, right foot pain radiating into her R ankle/lower leg, onset seven hours ago. Pt injured the foot when she "landed on the foot awkwardly while jumping in a bouncy house". No LOC or head trauma noted in the incident. She notes associated right foot swelling and ecchymosis to lateral aspect of right foot. Pain is worse with movement/wt bearing. Pt tried ice at home with no relief to symptoms. Pt has not taken medication for pain. No other associated symptoms noted. No other worsening or alleviating factors noted. Pt denies numbness, tingling, focal weakness, chest pain, shortness of breath, abdominal pain, nausea, vomiting, or any other pertinent symptoms.    Past Medical History  Diagnosis Date  . Hypertension   . Bronchitis   . Hypercholesteremia   . H/O cesarean section   . Anxiety   . Migraine   . Pneumonia   . GERD (gastroesophageal reflux  disease)    Past Surgical History  Procedure Laterality Date  . Abdominal hysterectomy      partial  . Cesarean section    . Tonsillectomy and adenoidectomy    . Tubal ligation    . Colonoscopy    . Shoulder arthroscopy w/ rotator cuff repair  6/15    right  . Nasal septoplasty w/ turbinoplasty Bilateral 06/26/2014    Procedure: BILATERAL NASAL SEPTOPLASTY WITH TURBINATE REDUCTION;  Surgeon: Drema Halon, MD;  Location: [facility];  Service: ENT;  Laterality: Bilateral;  . Ethmoidectomy Bilateral 06/26/2014    Procedure: BILATERAL ANTERIOR ETHMOIDECTOMY;  Surgeon: Drema Halon, MD;  Location: [facility];  Service: ENT;  Laterality: Bilateral;  . Maxillary antrostomy Bilateral 06/26/2014    Procedure: BILATERAL MAXILLARY OSTEA ENLARGEMENT;  Surgeon: Drema Halon, MD;  Location:  SUR[facility]ERY [facility];  Service: ENT;  Laterality: Bilateral;   Family History  Problem Relation Age of Onset  . Breast cancer Paternal Grandmother   . Heart disease Mother     MI  . Irritable bowel syndrome Mother   . Migraines Mother   . Lung cancer Paternal Grandfather   . Colon cancer Neg Hx   . Esophageal cancer Neg Hx   . Rectal cancer Neg Hx   . Stomach cancer Neg Hx   . Cerebral aneurysm Father   . AAA (abdominal aortic aneurysm) Sister    Social History  Substance Use Topics  . Smoking status: Current Every Day Smoker -- 1.00 packs/day for 35  years    Types: Cigarettes  . Smokeless tobacco: Never Used  . Alcohol Use: No   OB History    No data available     Review of Systems  HENT: Negative for facial swelling (no head inj).   Respiratory: Negative for shortness of breath.   Cardiovascular: Negative for chest pain.  Gastrointestinal: Negative for nausea, vomiting and abdominal pain.  Musculoskeletal: Positive for myalgias, joint swelling and arthralgias. Negative for back pain and neck pain.  Skin: Positive for color change.  Negative for wound.  Allergic/Immunologic: Negative for immunocompromised state.  Neurological: Negative for syncope, weakness and numbness.  Psychiatric/Behavioral: Negative for confusion.   10 Systems reviewed and all are negative for acute change except as noted in the HPI.  Allergies  Review of patient's allergies indicates no known allergies.  Home Medications   Prior to Admission medications   Medication Sig Start Date End Date Taking? Authorizing Provider  amLODipine (NORVASC) 10 MG tablet Take 1 tablet by mouth daily. 06/18/15   Historical Provider, MD  aspirin 325 MG tablet Take 650 mg by mouth daily.    Historical Provider, MD  dexlansoprazole (DEXILANT) 60 MG capsule Take 60 mg by mouth daily.    Historical Provider, MD  ergocalciferol (VITAMIN D2) 50000 UNITS capsule Take 50,000 Units by mouth once a week. Usually on Monday    Historical Provider, MD  gabapentin (NEURONTIN) 100 MG capsule One capsule three times a day for one week, then take 2 capsules three times a day 05/19/15   York Spaniel, MD  HYDROcodone-acetaminophen (NORCO/VICODIN) 5-325 MG per tablet Take 1-2 tablets by mouth every 6 (six) hours as needed for moderate pain. 06/26/14   Drema Halon, MD  Linaclotide [facility]) 145 MCG CAPS capsule Take 145 mcg by mouth daily.    Historical Provider, MD  lovastatin (MEVACOR) 20 MG tablet Take 20 mg by mouth at bedtime.    Historical Provider, MD  predniSONE (DELTASONE) 10 MG tablet Begin taking 6 tablets daily, taper by one tablet every other day until off the medication. Patient taking differently: Take 10 mg by mouth daily with breakfast. Begin taking 6 tablets daily, taper by one tablet every other day until off the medication. 06/12/15   York Spaniel, MD  SUMAtriptan (IMITREX) 100 MG tablet Take 100 mg by mouth every 2 (two) hours as needed. For migraines    Historical Provider, MD  valsartan (DIOVAN) 80 MG tablet Take 80 mg by mouth daily. 04/21/15    Historical Provider, MD   BP 142/87 mmHg  Pulse 72  Temp(Src) 98.4 F (36.9 C) (Oral)  Resp 27  SpO2 97% Physical Exam  Constitutional: She is oriented to person, place, and time. Vital signs are normal. She appears well-developed and well-nourished.  Non-toxic appearance. No distress.  Afebrile, nontoxic, NAD  HENT:  Head: Normocephalic and atraumatic.  Mouth/Throat: Mucous membranes are normal.  Eyes: Conjunctivae and EOM are normal. Right eye exhibits no discharge. Left eye exhibits no discharge.  Neck: Normal range of motion. Neck supple.  Cardiovascular: Normal rate and intact distal pulses.   Pulmonary/Chest: Effort normal. No respiratory distress.  Abdominal: Normal appearance. She exhibits no distension.  Musculoskeletal:       Right foot: There is decreased range of motion (due to pain.), tenderness, bony tenderness and swelling. There is normal capillary refill, no deformity and no laceration.       Feet:  Right foot with focal TTP along 5th metatarsal extending up towards  lateral malleolus, with bruising and swelling on the lateral aspect of foot, skin intact, with slightly limited ROM of ankle and digits due to pain but still able to wiggle all digits, no deformities, dorsiflexion and plantarflexion strength still intact, distal pulses intact, compartments soft. No calf TTP.   Neurological: She is alert and oriented to person, place, and time. She has normal strength. No sensory deficit.  Skin: Skin is warm, dry and intact. No rash noted.  Psychiatric: She has a normal mood and affect. Her behavior is normal.  Nursing note and vitals reviewed.   ED Course  Procedures (including critical care time)  SPLINT APPLICATION Date/Time: 6:30 PM Authorized by: Ramond Marrowamprubi-Soms, Apryle Stowell Strupp Consent: Verbal consent obtained. Risks and benefits: risks, benefits and alternatives were discussed Consent given by: patient Splint applied by: orthopedic technician Location details:  R leg Splint type: posterior short leg Supplies used: orthoglass Post-procedure: The splinted body part was neurovascularly unchanged following the procedure. Patient tolerance: Patient tolerated the procedure well with no immediate complications.    DIAGNOSTIC STUDIES: Oxygen Saturation is 97% on RA, normal by my interpretation.    COORDINATION OF CARE: 5:59 PM Discussed treatment plan with pt at bedside and pt agreed to plan.  Labs Review Labs Reviewed - No data to display  Imaging Review Dg Ankle Complete Right  08/12/2015  CLINICAL DATA:  Right ankle injury jumping in bounce house today. Right ankle pain. Initial encounter. EXAM: RIGHT ANKLE - COMPLETE 3+ VIEW COMPARISON:  Right foot radiographs also obtained today FINDINGS: There is no evidence of ankle fracture, dislocation, or joint effusion. There is no evidence of arthropathy or other focal bone abnormality involving the ankle. Fracture through the base of the fifth metatarsal is noted, as seen on separate right foot radiographs. IMPRESSION: No radiographic abnormality of ankle joint. Base of fifth metatarsal fracture. See separate report for right foot radiographs. Electronically Signed   By: Myles RosenthalJohn  Stahl M.D.   On: 08/12/2015 18:19   Dg Foot Complete Right  08/12/2015  CLINICAL DATA:  Right foot injury while jumping in bounce house yesterday. Lateral right foot pain. Initial encounter. EXAM: RIGHT FOOT COMPLETE - 3+ VIEW COMPARISON:  None. FINDINGS: Minimally displaced fracture is seen involving the base of the fifth metatarsal. No other fractures are identified. No evidence of dislocation. Small plantar calcaneal bone spur noted. IMPRESSION: Minimally displaced fracture involving base of fifth metatarsal. Electronically Signed   By: Myles RosenthalJohn  Stahl M.D.   On: 08/12/2015 18:18   I have personally reviewed and evaluated these images as part of my medical decision-making.   EKG Interpretation None      MDM   Final diagnoses:   Metatarsal fracture, right, closed, initial encounter  Foot pain, left   55 y.o. female here with R foot pain after twisting her foot and landing on it incorrectly while in a bounce house. NVI with soft compartments. Bruising and swelling along 5th metatarsal, focal tenderness to this area. Some lateral malleolus tenderness. Will obtain xray of ankle and foot, will give pain meds and apply ice, and reassess after xray imaging.  6:29 PM Xray showing minimally displaced base of 5th metatarsal fx. No ankle fx. Will apply posterior short leg splint, pt has crutches so she doesn't need any today. Will send home with pain meds. RICE discussed. F/up with ortho in 1 week for ongoing management of fx. I explained the diagnosis and have given explicit precautions to return to the ER including for any other new or  worsening symptoms. The patient understands and accepts the medical plan as it's been dictated and I have answered their questions. Discharge instructions concerning home care and prescriptions have been given. The patient is STABLE and is discharged to home in good condition.   I personally performed the services described in this documentation, which was scribed in my presence. The recorded information has been reviewed and is accurate.  BP 142/87 mmHg  Pulse 72  Temp(Src) 98.4 F (36.9 C) (Oral)  Resp 27  SpO2 97%  Meds ordered this encounter  Medications  . HYDROcodone-acetaminophen (NORCO/VICODIN) 5-325 MG per tablet 1 tablet    Sig:   . naproxen (NAPROSYN) 500 MG tablet    Sig: Take 1 tablet (500 mg total) by mouth 2 (two) times daily as needed for mild pain, moderate pain or headache (TAKE WITH MEALS.).    Dispense:  20 tablet    Refill:  0    Order Specific Question:  Supervising Provider    Answer:  MILLER, BRIAN [[id number]]  . HYDROcodone-acetaminophen (NORCO) 5-325 MG tablet    Sig: Take 1 tablet by mouth every 6 (six) hours as needed for severe pain.    Dispense:  20 tablet     Refill:  0    Order Specific Question:  Supervising Provider    Answer:  Eber Hong [[id number]]        Jerson Furukawa Camprubi-Soms, PA-C 08/12/15 1837  Zadie Rhine, MD 08/14/15 0021

## 2015-08-12 NOTE — Progress Notes (Signed)
Orthopedic Tech Progress Note Patient Details:  Regina Perry [birth year] [id number] Applied posterior fiberglass short leg splint to RLE.  Pulses, sensation, motion intact before and after splinting.  Capillary refill less than 2 seconds before and after splinting.  Pt. supplied her own crutches.  Fit her for crutches and taught use of same. Ortho Devices Type of Ortho Device: Crutches, Post (short leg) splint Ortho Device/Splint Location: RLE Ortho Device/Splint Interventions: Application   Lesle ChrisGilliland, Bodey Frizell L 08/12/2015, 6:55 PM

## 2015-09-11 ENCOUNTER — Other Ambulatory Visit: Payer: Self-pay | Admitting: Neurology

## 2015-09-17 ENCOUNTER — Ambulatory Visit (INDEPENDENT_AMBULATORY_CARE_PROVIDER_SITE_OTHER): Payer: BLUE CROSS/BLUE SHIELD | Admitting: Neurology

## 2015-09-17 ENCOUNTER — Encounter: Payer: Self-pay | Admitting: Neurology

## 2015-09-17 VITALS — BP 135/82 | HR 78 | Ht 63.0 in | Wt 133.8 lb

## 2015-09-17 DIAGNOSIS — M5432 Sciatica, left side: Secondary | ICD-10-CM

## 2015-09-17 DIAGNOSIS — M5431 Sciatica, right side: Secondary | ICD-10-CM

## 2015-09-17 MED ORDER — GABAPENTIN 300 MG PO CAPS: 300.0000 mg | ORAL_CAPSULE | Freq: Three times a day (TID) | ORAL | Status: DC

## 2015-09-17 NOTE — Progress Notes (Signed)
Reason for visit: Back pain, leg pain  Regina Perry is an 55 y.o. female  History of present illness:  Ms. Regina Perry is a 55 year old right-handed white female with a history of chronic back pain, bilateral leg discomfort. The patient has symptoms that are a bit worse on the left leg than the right. The patient does have bilateral symptoms, however. In the past, an epidural steroid injection on the low back did not help. The patient has had a repeat MRI of the low back that showed a disc bulge at the L5-S1 level, with mild nerve foraminal narrowing bilaterally, some possible contact with the S1 nerve roots bilaterally. The patient had no change from this study is compared to one done in 2014. The patient is been placed on gabapentin with some benefit, she is only on 200 mg 3 times daily. She recently broke the right foot in May 2017, she is in a healing boot at this point. She returns for an evaluation.  Past Medical History  Diagnosis Date  . Hypertension   . Bronchitis   . Hypercholesteremia   . H/O cesarean section   . Anxiety   . Migraine   . Pneumonia   . GERD (gastroesophageal reflux disease)     Past Surgical History  Procedure Laterality Date  . Abdominal hysterectomy      partial  . Cesarean section    . Tonsillectomy and adenoidectomy    . Tubal ligation    . Colonoscopy    . Shoulder arthroscopy w/ rotator cuff repair  6/15    right  . Nasal septoplasty w/ turbinoplasty Bilateral 06/26/2014    Procedure: BILATERAL NASAL SEPTOPLASTY WITH TURBINATE REDUCTION;  Surgeon: Regina Halonhristopher E Newman, MD;  Location: [facility];  Service: ENT;  Laterality: Bilateral;  . Ethmoidectomy Bilateral 06/26/2014    Procedure: BILATERAL ANTERIOR ETHMOIDECTOMY;  Surgeon: Regina Halonhristopher E Newman, MD;  Location: [facility];  Service: ENT;  Laterality: Bilateral;  . Maxillary antrostomy Bilateral 06/26/2014    Procedure: BILATERAL MAXILLARY OSTEA ENLARGEMENT;  Surgeon:  Regina Halonhristopher E Newman, MD;  Location: [facility];  Service: ENT;  Laterality: Bilateral;    Family History  Problem Relation Age of Onset  . Breast cancer Paternal Grandmother   . Heart disease Mother     MI  . Irritable bowel syndrome Mother   . Migraines Mother   . Lung cancer Paternal Grandfather   . Colon cancer Neg Hx   . Esophageal cancer Neg Hx   . Rectal cancer Neg Hx   . Stomach cancer Neg Hx   . Cerebral aneurysm Father   . AAA (abdominal aortic aneurysm) Sister     Social history:  reports that she has been smoking Cigarettes.  She has a 35 pack-year smoking history. She has never used smokeless tobacco. She reports that she does not drink alcohol or use illicit drugs.   No Known Allergies  Medications:  Prior to Admission medications   Medication Sig Start Date End Date Taking? Authorizing Provider  ALPRAZolam Prudy Feeler(XANAX) 0.5 MG tablet  08/26/15  Yes Historical Provider, MD  aspirin 325 MG tablet Take 650 mg by mouth daily.   Yes Historical Provider, MD  buPROPion [facility](WELLBUTRIN SR) 150 MG 12 hr tablet  09/11/15  Yes Historical Provider, MD  ergocalciferol (VITAMIN D2) 50000 UNITS capsule Take 50,000 Units by mouth once a week. Usually on Monday   Yes Historical Provider, MD  Linaclotide Karlene Einstein(LINZESS) 145 MCG CAPS capsule  Take 145 mcg by mouth daily.   Yes Historical Provider, MD  lovastatin (MEVACOR) 20 MG tablet Take 20 mg by mouth at bedtime.   Yes Historical Provider, MD  SUMAtriptan (IMITREX) 100 MG tablet Take 100 mg by mouth every 2 (two) hours as needed. For migraines   Yes Historical Provider, MD  valsartan (DIOVAN) 80 MG tablet Take 80 mg by mouth daily. 04/21/15  Yes Historical Provider, MD  verapamil (CALAN-SR) 180 MG CR tablet  09/10/15  Yes Historical Provider, MD  gabapentin (NEURONTIN) 300 MG capsule Take 1 capsule (300 mg total) by mouth 3 (three) times daily. 09/17/15   Regina Spanielharles K Willis, MD    ROS:  Out of a complete 14 system review of symptoms,  the patient complains only of the following symptoms, and all other reviewed systems are negative.  Palpitations of the heart Anxiety  Blood pressure 135/82, pulse 78, height 5\' 3"  (1.6 m), weight 133 lb 12 oz (60.669 kg).  Physical Exam  General: The patient is alert and cooperative at the time of the examination.  Neuromuscular: Range of movement of the low back is full. There is some tenderness over the left greater than right SI joint. The patient reports some radiation down the left leg to the knee with palpation.  Skin: No significant peripheral edema is noted.   Neurologic Exam  Mental status: The patient is alert and oriented x 3 at the time of the examination. The patient has apparent normal recent and remote memory, with an apparently normal attention span and concentration ability.   Cranial nerves: Facial symmetry is present. Speech is normal, no aphasia or dysarthria is noted. Extraocular movements are full. Visual fields are full.  Motor: The patient has good strength in all 4 extremities.  Sensory examination: Soft touch sensation is symmetric on the face, arms, and legs.  Coordination: The patient has good finger-nose-finger and heel-to-shin bilaterally.  Gait and station: The patient has a normal gait. Tandem gait is normal. Romberg is negative. No drift is seen.  Reflexes: Deep tendon reflexes are symmetric.   Assessment/Plan:  1. Chronic low back pain, leg discomfort  The patient is responding somewhat to the gabapentin, the dose will be increased to 300 mg 3 times daily. She may call our office for dose adjustments. It is possible that the patient may have some SI joint dysfunction on the left, we may consider an injection of the left SI joint in the future if she does not continue to improve. She will otherwise follow-up in 4 months.  Regina Palau. Keith Willis MD 09/17/2015 8:30 PM  [facility] [address] [place], [place]  [id number]  Phone [phone] Fax [phone]

## 2015-10-23 ENCOUNTER — Ambulatory Visit
Admission: RE | Admit: 2015-10-23 | Discharge: 2015-10-23 | Disposition: A | Discharge status: Home / Self Care | Payer: BLUE CROSS/BLUE SHIELD | Source: Ambulatory Visit | Attending: Internal Medicine | Admitting: Internal Medicine

## 2015-10-23 ENCOUNTER — Other Ambulatory Visit: Payer: Self-pay | Admitting: Internal Medicine

## 2015-10-23 DIAGNOSIS — R109 Unspecified abdominal pain: Secondary | ICD-10-CM

## 2015-10-27 ENCOUNTER — Ambulatory Visit
Admission: RE | Admit: 2015-10-27 | Discharge: 2015-10-27 | Disposition: A | Discharge status: Home / Self Care | Payer: BLUE CROSS/BLUE SHIELD | Source: Ambulatory Visit | Attending: Internal Medicine | Admitting: Internal Medicine

## 2015-10-27 ENCOUNTER — Other Ambulatory Visit: Payer: Self-pay | Admitting: Internal Medicine

## 2015-10-27 DIAGNOSIS — Z09 Encounter for follow-up examination after completed treatment for conditions other than malignant neoplasm: Secondary | ICD-10-CM

## 2015-12-09 ENCOUNTER — Encounter (HOSPITAL_COMMUNITY): Payer: Self-pay | Admitting: Emergency Medicine

## 2015-12-09 DIAGNOSIS — I10 Essential (primary) hypertension: Secondary | ICD-10-CM | POA: Insufficient documentation

## 2015-12-09 DIAGNOSIS — Z7982 Long term (current) use of aspirin: Secondary | ICD-10-CM | POA: Insufficient documentation

## 2015-12-09 DIAGNOSIS — R079 Chest pain, unspecified: Secondary | ICD-10-CM | POA: Diagnosis present

## 2015-12-09 DIAGNOSIS — F17210 Nicotine dependence, cigarettes, uncomplicated: Secondary | ICD-10-CM | POA: Diagnosis not present

## 2015-12-09 DIAGNOSIS — Z79899 Other long term (current) drug therapy: Secondary | ICD-10-CM | POA: Diagnosis not present

## 2015-12-09 NOTE — ED Triage Notes (Signed)
Pt. reports central chest pain /pressure with SOB and emesis onset this evening , pt. added diarrhea for several weeks .

## 2015-12-10 ENCOUNTER — Emergency Department (HOSPITAL_COMMUNITY): Payer: BLUE CROSS/BLUE SHIELD

## 2015-12-10 ENCOUNTER — Emergency Department (HOSPITAL_COMMUNITY)
Admission: EM | Admit: 2015-12-10 | Discharge: 2015-12-10 | Disposition: A | Discharge status: Home / Self Care | Payer: BLUE CROSS/BLUE SHIELD | Attending: Emergency Medicine | Admitting: Emergency Medicine

## 2015-12-10 DIAGNOSIS — R079 Chest pain, unspecified: Secondary | ICD-10-CM

## 2015-12-10 LAB — CBC
HCT: 42.1 % (ref 36.0–46.0)
Hemoglobin: 13.5 g/dL (ref 12.0–15.0)
MCH: 29.4 pg (ref 26.0–34.0)
MCHC: 32.1 g/dL (ref 30.0–36.0)
MCV: 91.7 fL (ref 78.0–100.0)
Platelets: 240 10*3/uL (ref 150–400)
RBC: 4.59 MIL/uL (ref 3.87–5.11)
RDW: 13.9 % (ref 11.5–15.5)
WBC: 11.8 10*3/uL — ABNORMAL HIGH (ref 4.0–10.5)

## 2015-12-10 LAB — BASIC METABOLIC PANEL
Anion gap: 8 (ref 5–15)
BUN: 13 mg/dL (ref 6–20)
CO2: 26 mmol/L (ref 22–32)
Calcium: 10.1 mg/dL (ref 8.9–10.3)
Chloride: 107 mmol/L (ref 101–111)
Creatinine, Ser: 1.05 mg/dL — ABNORMAL HIGH (ref 0.44–1.00)
GFR calc Af Amer: >60 mL/min (ref >60)
GFR calc non Af Amer: 60 mL/min — ABNORMAL LOW (ref >60)
Glucose, Bld: 112 mg/dL — ABNORMAL HIGH (ref 65–99)
Potassium: 3.9 mmol/L (ref 3.5–5.1)
Sodium: 141 mmol/L (ref 135–145)

## 2015-12-10 LAB — I-STAT TROPONIN, ED
Troponin i, poc: 0 ng/mL (ref 0.00–0.08)
Troponin i, poc: 0 ng/mL (ref 0.00–0.08)

## 2015-12-10 MED ORDER — GI COCKTAIL ~~LOC~~
30.0000 mL | Freq: Once | ORAL | Status: AC
  Administered 2015-12-10: 30 mL via ORAL
  Filled 2015-12-10: qty 30

## 2015-12-10 MED ORDER — OMEPRAZOLE 20 MG PO CPDR: 20.0000 mg | DELAYED_RELEASE_CAPSULE | Freq: Every day | ORAL | 0 refills | Status: DC

## 2015-12-10 MED ORDER — SUCRALFATE 1 G PO TABS: 1.0000 g | ORAL_TABLET | Freq: Three times a day (TID) | ORAL | 0 refills | Status: DC

## 2015-12-10 NOTE — ED Provider Notes (Signed)
[facility]-EMERGENCY DEPT Provider Note   CSN: [id number] Arrival date & time: 12/09/15  2344     History   Chief Complaint Chief Complaint  Patient presents with  . Chest Pain    HPI Regina Perry is a 55 y.o. female.  Patient with history of GERD, peptic ulcer disease, hiatal hernia, chest pain with negative stress tests/Myoviews -- presents with acute onset of chest pain at approximately 8 PM after eating spaghetti. Patient had 2 episodes of sharp pain in her middle chest lasting approximately 5 minutes each. Symptoms are not associated with diaphoresis, shortness of breath (contradicting triage note). She continues to have pressure in her middle chest. Pain was worse with swallowing. Pain also radiates to her epigastric region. No back pain. Patient had an episode of vomiting. No fevers. No recent chest pains or shortness of breath with activity. No lower extremity swelling. No treatments prior to arrival. States chronic diarrhea over the past several months. She has seen her primary care for this and was prescribed a medication for diarrhea. She has not followed up with a gastroenterologist.      Past Medical History:  Diagnosis Date  . Anxiety   . Bronchitis   . GERD (gastroesophageal reflux disease)   . H/O cesarean section   . Hypercholesteremia   . Hypertension   . Migraine   . Pneumonia     Patient Active Problem List   Diagnosis Date Noted  . Bilateral sciatica 05/19/2015  . Chest pain at rest 04/09/2012  . Hypertension 04/09/2012  . Hyperlipidemia 04/09/2012  . Tobacco abuse 04/09/2012    Past Surgical History:  Procedure Laterality Date  . ABDOMINAL HYSTERECTOMY     partial  . CESAREAN SECTION    . COLONOSCOPY    . ETHMOIDECTOMY Bilateral 06/26/2014   Procedure: BILATERAL ANTERIOR ETHMOIDECTOMY;  Surgeon: Drema Halon, MD;  Location: [facility];  Service: ENT;  Laterality: Bilateral;  . MAXILLARY ANTROSTOMY Bilateral 06/26/2014   Procedure: BILATERAL MAXILLARY OSTEA ENLARGEMENT;  Surgeon: Drema Halon, MD;  Location: [facility];  Service: ENT;  Laterality: Bilateral;  . NASAL SEPTOPLASTY W/ TURBINOPLASTY Bilateral 06/26/2014   Procedure: BILATERAL NASAL SEPTOPLASTY WITH TURBINATE REDUCTION;  Surgeon: Drema Halon, MD;  Location: [facility];  Service: ENT;  Laterality: Bilateral;  . SHOULDER ARTHROSCOPY W/ ROTATOR CUFF REPAIR  6/15   right  . TONSILLECTOMY AND ADENOIDECTOMY    . TUBAL LIGATION      OB History    No data available       Home Medications    Prior to Admission medications   Medication Sig Start Date End Date Taking? Authorizing Provider  ALPRAZolam Prudy Feeler) 0.5 MG tablet  08/26/15   Historical Provider, MD  aspirin 325 MG tablet Take 650 mg by mouth daily.    Historical Provider, MD  buPROPion [facility] SR) 150 MG 12 hr tablet  09/11/15   Historical Provider, MD  ergocalciferol (VITAMIN D2) 50000 UNITS capsule Take 50,000 Units by mouth once a week. Usually on Monday    Historical Provider, MD  gabapentin (NEURONTIN) 300 MG capsule Take 1 capsule (300 mg total) by mouth 3 (three) times daily. 09/17/15   York Spaniel, MD  Linaclotide [facility]) 145 MCG CAPS capsule Take 145 mcg by mouth daily.    Historical Provider, MD  lovastatin (MEVACOR) 20 MG tablet Take 20 mg by mouth at bedtime.    Historical Provider, MD  omeprazole (PRILOSEC) 20 MG capsule  Take 1 capsule (20 mg total) by mouth daily. 12/10/15   Renne CriglerJoshua Crescent Gotham, PA-C  sucralfate (CARAFATE) 1 g tablet Take 1 tablet (1 g total) by mouth 4 (four) times daily -  with meals and at bedtime. 12/10/15   Renne CriglerJoshua Rondey Fallen, PA-C  SUMAtriptan (IMITREX) 100 MG tablet Take 100 mg by mouth every 2 (two) hours as needed. For migraines    Historical Provider, MD  valsartan (DIOVAN) 80 MG tablet Take 80 mg by mouth daily. 04/21/15   Historical Provider, MD  verapamil (CALAN-SR) 180 MG CR tablet  09/10/15   Historical  Provider, MD    Family History Family History  Problem Relation Age of Onset  . Heart disease Mother     MI  . Irritable bowel syndrome Mother   . Migraines Mother   . Cerebral aneurysm Father   . AAA (abdominal aortic aneurysm) Sister   . Breast cancer Paternal Grandmother   . Lung cancer Paternal Grandfather   . Colon cancer Neg Hx   . Esophageal cancer Neg Hx   . Rectal cancer Neg Hx   . Stomach cancer Neg Hx     Social History Social History  Substance Use Topics  . Smoking status: Current Every Day Smoker    Packs/day: 1.00    Years: 35.00    Types: Cigarettes  . Smokeless tobacco: Never Used  . Alcohol use No     Allergies   Review of patient's allergies indicates no known allergies.   Review of Systems Review of Systems  Constitutional: Negative for diaphoresis and fever.  HENT: Positive for trouble swallowing.   Eyes: Negative for redness.  Respiratory: Negative for cough and shortness of breath.   Cardiovascular: Positive for chest pain. Negative for palpitations and leg swelling.  Gastrointestinal: Positive for diarrhea. Negative for abdominal pain, nausea and vomiting.  Genitourinary: Negative for dysuria.  Musculoskeletal: Negative for back pain and neck pain.  Skin: Negative for rash.  Neurological: Negative for syncope and light-headedness.  Psychiatric/Behavioral: The patient is not nervous/anxious.      Physical Exam Updated Vital Signs BP 130/82   Pulse 77   Temp 97.8 F (36.6 C) (Oral)   Resp 19   SpO2 96%   Physical Exam  Constitutional: She appears well-developed and well-nourished.  HENT:  Head: Normocephalic and atraumatic.  Mouth/Throat: Oropharynx is clear and moist and mucous membranes are normal. Mucous membranes are not dry.  Eyes: Conjunctivae are normal.  Neck: Trachea normal and normal range of motion. Neck supple. Normal carotid pulses and no JVD present. No muscular tenderness present. Carotid bruit is not present. No  tracheal deviation present.  Cardiovascular: Normal rate, regular rhythm, S1 normal, S2 normal, normal heart sounds and intact distal pulses.  Exam reveals no decreased pulses.   No murmur heard. Pulmonary/Chest: Effort normal. No respiratory distress. She has no wheezes. She exhibits no tenderness.  Abdominal: Soft. Normal aorta and bowel sounds are normal. There is tenderness (Mild epigastric tenderness). There is no rebound and no guarding.  Musculoskeletal: Normal range of motion.  Neurological: She is alert.  Skin: Skin is warm and dry. She is not diaphoretic. No cyanosis. No pallor.  Psychiatric: She has a normal mood and affect.  Nursing note and vitals reviewed.    ED Treatments / Results  Labs (all labs ordered are listed, but only abnormal results are displayed) Labs Reviewed  BASIC METABOLIC PANEL - Abnormal; Notable for the following:       Result Value  Glucose, Bld 112 (*)    Creatinine, Ser 1.05 (*)    GFR calc non Af Amer 60 (*)    All other components within normal limits  CBC - Abnormal; Notable for the following:    WBC 11.8 (*)    All other components within normal limits  I-STAT TROPOININ, ED  I-STAT TROPOININ, ED    EKG  EKG Interpretation  Date/Time:  Wednesday December 09 2015 23:51:26 EDT Ventricular Rate:  81 PR Interval:  144 QRS Duration: 90 QT Interval:  378 QTC Calculation: 439 R Axis:   26 Text Interpretation:  Normal sinus rhythm Cannot rule out Anterior infarct , age undetermined Abnormal ECG Confirmed by Ranae Palms  MD, DAVID ([id number]) on 12/10/2015 5:34:37 AM       Radiology Dg Chest 2 View  Result Date: 12/10/2015 CLINICAL DATA:  Chest pain and vomiting tonight. EXAM: CHEST  2 VIEW COMPARISON:  06/25/2015 FINDINGS: Heart size is normal. Prominent right epicardial fat pad, as seen on lung bases from CT abdomen 11/25/2015, mediastinal contours otherwise normal. Mild biapical pleural parenchymal scarring. Pulmonary vasculature is normal.  No consolidation, pleural effusion, or pneumothorax. No acute osseous abnormalities are seen. IMPRESSION: No active cardiopulmonary disease. Electronically Signed   By: Rubye Oaks M.D.   On: 12/10/2015 00:45    Procedures Procedures (including critical care time)  Medications Ordered in ED Medications  gi cocktail (Maalox,Lidocaine,Donnatal) (30 mLs Oral Given 12/10/15 0506)     Initial Impression / Assessment and Plan / ED Course  I have reviewed the triage vital signs and the nursing notes.  Pertinent labs & imaging results that were available during my care of the patient were reviewed by me and considered in my medical decision making (see chart for details).  Clinical Course   Patient seen and examined. Workup reviewed including troponin negative 2, normal chest x-ray, unchanged EKG. Discussed results with patient and husband at bedside. Patient was given a GI cocktail at the onset of our interview and by the end she states that the medicine made her feel better.   Vital signs reviewed and are as follows: BP 130/82   Pulse 77   Temp 97.8 F (36.6 C) (Oral)   Resp 19   SpO2 96%   Patient is low risk for cardiac etiology given findings tonight as well as history and previous negative stress testing. Low concern for ACS or PE. Patient will be restarted on omeprazole. She can take over-the-counter Prilosec if desired. Prescription also given for Carafate. GI referral given. I strongly encouraged this given her recent bowel changes and chronic diarrhea.  The patient was urged to return to the Emergency Department immediately with worsening of current symptoms, worsening abdominal pain, persistent vomiting, blood noted in stools, fever, or any other concerns. The patient verbalized understanding.    Final Clinical Impressions(s) / ED Diagnoses   Final diagnoses:  Chest pain, unspecified chest pain type   Chest pain/epigastric pain: Cardiac workup reassuring as above.  Patient's story is atypical for ACS. No significant risk factors for PE. Low suspicion for aortic dissection. The 2 short-lived episodes of sharp stabbing pain in the upper chest, worse with swallowing, suspicious for esophageal spasm. Remainder of pain suggestive of GERD type etiology. Patient feels much better after GI cocktail. Treatment and follow-up as above. Return instructions discussed as above.  New Prescriptions New Prescriptions   OMEPRAZOLE (PRILOSEC) 20 MG CAPSULE    Take 1 capsule (20 mg total) by mouth daily.   SUCRALFATE (  CARAFATE) 1 G TABLET    Take 1 tablet (1 g total) by mouth 4 (four) times daily -  with meals and at bedtime.     Renne Crigler, PA-C 12/10/15 0537    Renne Crigler, PA-C 12/10/15 1610    Loren Racer, MD 12/10/15 [phone]

## 2015-12-10 NOTE — Discharge Instructions (Signed)
Please read and follow all provided instructions.  Your diagnoses today include:  1. Chest pain, unspecified chest pain type     Tests performed today include:  An EKG of your heart  A chest x-ray  Cardiac enzymes - a blood test for heart muscle damage  Blood counts and electrolytes  Vital signs. See below for your results today.   Medications prescribed:   Omeprazole (Prilosec) - stomach acid reducer  This medication can be found over-the-counter   Carafate - for stomach upset and to protect your stomach  Take any prescribed medications only as directed.  Follow-up instructions: Please follow-up with the stomach doctor listed for further evaluation of your reflux and diarrhea symptoms.   Return instructions:  SEEK IMMEDIATE MEDICAL ATTENTION IF:  You have severe chest pain, especially if the pain is crushing or pressure-like and spreads to the arms, back, neck, or jaw, or if you have sweating, nausea (feeling sick to your stomach), or shortness of breath. THIS IS AN EMERGENCY. Don't wait to see if the pain will go away. Get medical help at once. Call 911 or 0 (operator). DO NOT drive yourself to the hospital.   Your chest pain gets worse and does not go away with rest.   You have an attack of chest pain lasting longer than usual, despite rest and treatment with the medications your caregiver has prescribed.   You wake from sleep with chest pain or shortness of breath.  You feel dizzy or faint.  You have chest pain not typical of your usual pain for which you originally saw your caregiver.   You have any other emergent concerns regarding your health.  Additional Information: Chest pain comes from many different causes. Your caregiver has diagnosed you as having chest pain that is not specific for one problem, but does not require admission.  You are at low risk for an acute heart condition or other serious illness.   Your vital signs today were: BP 130/82    Pulse  77    Temp 97.8 F (36.6 C) (Oral)    Resp 19    SpO2 96%  If your blood pressure (BP) was elevated above 135/85 this visit, please have this repeated by your doctor within one month. --------------

## 2016-01-12 ENCOUNTER — Encounter: Payer: Self-pay | Admitting: Gastroenterology

## 2016-01-12 ENCOUNTER — Ambulatory Visit (INDEPENDENT_AMBULATORY_CARE_PROVIDER_SITE_OTHER): Payer: BLUE CROSS/BLUE SHIELD | Admitting: Gastroenterology

## 2016-01-12 VITALS — BP 128/82 | HR 76 | Ht 63.0 in | Wt 138.2 lb

## 2016-01-12 DIAGNOSIS — K582 Mixed irritable bowel syndrome: Secondary | ICD-10-CM | POA: Diagnosis not present

## 2016-01-12 DIAGNOSIS — K219 Gastro-esophageal reflux disease without esophagitis: Secondary | ICD-10-CM

## 2016-01-12 DIAGNOSIS — R1084 Generalized abdominal pain: Secondary | ICD-10-CM | POA: Diagnosis not present

## 2016-01-12 MED ORDER — GLYCOPYRROLATE 2 MG PO TABS: 2.0000 mg | ORAL_TABLET | Freq: Two times a day (BID) | ORAL | 5 refills | Status: DC

## 2016-01-12 MED ORDER — OMEPRAZOLE 40 MG PO CPDR: 40.0000 mg | DELAYED_RELEASE_CAPSULE | Freq: Every day | ORAL | 5 refills | Status: DC

## 2016-01-12 NOTE — Progress Notes (Signed)
    History of Present Illness: This is a 55 year old female referred by Regina Perry, Roy, MD for the evaluation of GERD, abdominal pain, diarrhea and constipation. In ED for chest pain and epigastric pain on 12/10/2015 and symptoms improved with a GI cocktail. Recent CT scan and prior EGD and colonoscopy below. She relates generalized abdominal pain that worsens with bowel movements. She has a history of constipation with abdominal pain and had been maintained on Linzess until she developed diarrhea approximately 8 weeks ago which has now resolved. Stool studies per referral notes were negative. Blood work from early Sept was unremarkable except a low total protein at 6.0 and elevated glucose at 119. She now is having normal to slightly constipated bowel movements for the past 2 weeks. Then normalized abdominal pain and cramping persists. Reflux symptoms are under better control on daily omeprazole. She has undergone treatment of a kidney stone. She states she had a second CT scan performed about one month after the first CT scan. Denies weight loss, change in stool caliber, melena, hematochezia, nausea, vomiting, dysphagia, chest pain.   Colonoscopy 07/2012 small hemorrhoids, otherwise normal EGD 04/2012 erosive gastritis, otherwise normal  Abd/pelvic CT wo contrast 09/2015 IMPRESSION: Two left ureteropelvic junction stones causing mild hydronephrosis. The largest of these stones measures approximately 6 mm. A smaller 5 mm nonobstructing left lower pole renal stone is noted.  Review of Systems: Pertinent positive and negative review of systems were noted in the above HPI section. All other review of systems were otherwise negative.  Current Medications, Allergies, Past Medical History, Past Surgical History, Family History and Social History were reviewed in [facility] electronic medical record.  Physical Exam: General: Well developed, well nourished, no acute distress Head: Normocephalic and  atraumatic Eyes:  sclerae anicteric, EOMI Ears: Normal auditory acuity Mouth: No deformity or lesions Neck: Supple, no masses or thyromegaly Lungs: Clear throughout to auscultation Heart: Regular rate and rhythm; no murmurs, rubs or bruits Abdomen: Soft, mild generalized tenderness and non distended. No masses, hepatosplenomegaly or hernias noted. Normal Bowel sounds Musculoskeletal: Symmetrical with no gross deformities  Skin: No lesions on visible extremities Pulses:  Normal pulses noted Extremities: No clubbing, cyanosis, edema or deformities noted Neurological: Alert oriented x 4, grossly nonfocal Cervical Nodes:  No significant cervical adenopathy Inguinal Nodes: No significant inguinal adenopathy Psychological:  Alert and cooperative. Normal mood and affect  Assessment and Recommendations:  1. GERD. Intensify antireflux measures. Increase omeprazole to 40 mg daily.  2. IBS with alternating bowel pattern currently having mild constipation. Add daily Colace. Begin glycopyrrolate 2 mg twice a day. If constipation returns consider resuming Linzess at 72 g per day as 145 g per day caused diarrhea. Also consider Trulance. Request records of most recent CT scan. REV in 6 weeks.   cc: Regina Okoy Moreira, MD [phone] [place], [place] [id number]

## 2016-01-12 NOTE — Patient Instructions (Signed)
We have sent the following medications to your pharmacy for you to pick up at your convenience: glycopyrrolate 2 mg one tablet by mouth twice daily and omeprazole 40 mg daily.  Patient advised to avoid spicy, acidic, citrus, chocolate, mints, fruit and fruit juices.  Limit the intake of caffeine, alcohol and Soda.  Don't exercise too soon after eating.  Don't lie down within 3-4 hours of eating.  Elevate the head of your bed.  Thank you for choosing me and [facility].  Venita LickMalcolm T. Pleas KochStark, Jr., MD., Clementeen GrahamFACG

## 2016-01-18 ENCOUNTER — Ambulatory Visit: Payer: BLUE CROSS/BLUE SHIELD | Admitting: Adult Health

## 2016-03-09 ENCOUNTER — Ambulatory Visit: Payer: BLUE CROSS/BLUE SHIELD | Admitting: Gastroenterology

## 2016-03-17 DIAGNOSIS — Z0271 Encounter for disability determination: Secondary | ICD-10-CM

## 2016-04-12 ENCOUNTER — Ambulatory Visit: Payer: BLUE CROSS/BLUE SHIELD | Admitting: Gastroenterology

## 2016-05-12 DIAGNOSIS — Z0271 Encounter for disability determination: Secondary | ICD-10-CM

## 2016-06-21 DIAGNOSIS — Z0271 Encounter for disability determination: Secondary | ICD-10-CM

## 2017-01-11 ENCOUNTER — Other Ambulatory Visit: Payer: Self-pay | Admitting: Neurosurgery

## 2017-01-12 ENCOUNTER — Other Ambulatory Visit: Payer: Self-pay | Admitting: Neurosurgery

## 2017-02-01 NOTE — Pre-Procedure Instructions (Signed)
Regina Perry  02/01/2017      [facility] [place] [address] [place] [address] Phone: [phone] Fax: [phone]    Your procedure is scheduled on February 06, 2017.  [facility] at [facility]30 AM.  Call this number if you have problems the morning of surgery:  [phone]   Remember:  Do not eat food or drink liquids after midnight.  Take these medicines the morning of surgery with A SIP OF WATER bupropion (wellbutrin), citalopram (celexa), clonazepam (klonopin), cyclobenzaprine (flexeril), gabapentin (neurontin), hydrocodone-acetaminophen (norco)-if needed, omeprazole (prilosec), promethazine (phenergan).  7 days prior to surgery STOP taking any Aspirin (unless otherwise instructed by your surgeon), Aleve, Naproxen, Ibuprofen, Motrin, Advil, Goody's, BC's, all herbal medications, fish oil, and all vitamins  Continue all other medications as instructed by your physician except follow the above medication instructions before surgery   Do not wear jewelry, make-up or nail polish.  Do not wear lotions, powders, or perfumes, or deoderant.  Do not shave 48 hours prior to surgery.    Do not bring valuables to the hospital.  [facility] is not responsible for any belongings or valuables.  Contacts, dentures or bridgework may not be worn into surgery.  Leave your suitcase in the car.  After surgery it may be brought to your room.  For patients admitted to the hospital, discharge time will be determined by your treatment team.  Patients discharged the day of surgery will not be allowed to drive home.   Special instructions:   Regina Perry- Preparing For Surgery  Before surgery, you can play an important role. Because skin is not sterile, your skin needs to be as free of germs as possible. You can reduce the number of germs on your skin by washing with CHG (chlorahexidine gluconate) Soap before  surgery.  CHG is an antiseptic cleaner which kills germs and bonds with the skin to continue killing germs even after washing.  Please do not use if you have an allergy to CHG or antibacterial soaps. If your skin becomes reddened/irritated stop using the CHG.  Do not shave (including legs and underarms) for at least 48 hours prior to first CHG shower. It is OK to shave your face.  Please follow these instructions carefully.   1. Shower the NIGHT BEFORE SURGERY and the MORNING OF SURGERY with CHG.   2. If you chose to wash your hair, wash your hair first as usual with your normal shampoo.  3. After you shampoo, rinse your hair and body thoroughly to remove the shampoo.  4. Use CHG as you would any other liquid soap. You can apply CHG directly to the skin and wash gently with a scrungie or a clean washcloth.   5. Apply the CHG Soap to your body ONLY FROM THE NECK DOWN.  Do not use on open wounds or open sores. Avoid contact with your eyes, ears, mouth and genitals (private parts). Wash Face and genitals (private parts)  with your normal soap.  6. Wash thoroughly, paying special attention to the area where your surgery will be performed.  7. Thoroughly rinse your body with warm water from the neck down.  8. DO NOT shower/wash with your normal soap after using and rinsing off the CHG Soap.  9. Pat yourself dry with a CLEAN TOWEL.  10. Wear CLEAN PAJAMAS to bed the night before surgery, wear comfortable clothes the morning  of surgery  11. Place CLEAN SHEETS on your bed the night of your first shower and DO NOT SLEEP WITH PETS.    Day of Surgery: Do not apply any deodorants/lotions. Please wear clean clothes to the hospital/surgery center.     Please read over the following fact sheets that you were given. Pain Booklet, Coughing and Deep Breathing, MRSA Information and Surgical Site Infection Prevention

## 2017-02-02 ENCOUNTER — Other Ambulatory Visit: Payer: Self-pay

## 2017-02-02 ENCOUNTER — Encounter (HOSPITAL_COMMUNITY)
Admission: RE | Admit: 2017-02-02 | Discharge: 2017-02-02 | Disposition: A | Discharge status: Home / Self Care | Payer: BLUE CROSS/BLUE SHIELD | Source: Ambulatory Visit | Attending: Neurosurgery | Admitting: Neurosurgery

## 2017-02-02 ENCOUNTER — Encounter (HOSPITAL_COMMUNITY): Payer: Self-pay

## 2017-02-02 DIAGNOSIS — Z01810 Encounter for preprocedural cardiovascular examination: Secondary | ICD-10-CM | POA: Diagnosis present

## 2017-02-02 DIAGNOSIS — Z01812 Encounter for preprocedural laboratory examination: Secondary | ICD-10-CM | POA: Diagnosis present

## 2017-02-02 HISTORY — DX: Other specified postprocedural states: R11.2

## 2017-02-02 HISTORY — DX: Other specified postprocedural states: Z98.890

## 2017-02-02 HISTORY — DX: Major depressive disorder, single episode, unspecified: F32.9

## 2017-02-02 HISTORY — DX: Personal history of urinary calculi: Z87.442

## 2017-02-02 HISTORY — DX: Palpitations: R00.2

## 2017-02-02 HISTORY — DX: Diverticulitis of intestine, part unspecified, without perforation or abscess without bleeding: K57.92

## 2017-02-02 HISTORY — DX: Personal history of other diseases of the digestive system: Z87.19

## 2017-02-02 HISTORY — DX: Depression, unspecified: F32.A

## 2017-02-02 LAB — CBC
HCT: 42.9 % (ref 36.0–46.0)
Hemoglobin: 14.2 g/dL (ref 12.0–15.0)
MCH: 29.8 pg (ref 26.0–34.0)
MCHC: 33.1 g/dL (ref 30.0–36.0)
MCV: 89.9 fL (ref 78.0–100.0)
Platelets: 211 10*3/uL (ref 150–400)
RBC: 4.77 MIL/uL (ref 3.87–5.11)
RDW: 13.3 % (ref 11.5–15.5)
WBC: 9.3 10*3/uL (ref 4.0–10.5)

## 2017-02-02 LAB — BASIC METABOLIC PANEL
Anion gap: 6 (ref 5–15)
BUN: 6 mg/dL (ref 6–20)
CO2: 25 mmol/L (ref 22–32)
Calcium: 9.1 mg/dL (ref 8.9–10.3)
Chloride: 109 mmol/L (ref 101–111)
Creatinine, Ser: 0.9 mg/dL (ref 0.44–1.00)
GFR calc Af Amer: >60 mL/min (ref >60)
GFR calc non Af Amer: >60 mL/min (ref >60)
Glucose, Bld: 89 mg/dL (ref 65–99)
Potassium: 4.4 mmol/L (ref 3.5–5.1)
Sodium: 140 mmol/L (ref 135–145)

## 2017-02-02 LAB — ABO/RH: ABO/RH(D): A POS

## 2017-02-02 LAB — TYPE AND SCREEN
ABO/RH(D): A POS
Antibody Screen: NEGATIVE

## 2017-02-02 LAB — SURGICAL PCR SCREEN
MRSA, PCR: NEGATIVE
Staphylococcus aureus: NEGATIVE

## 2017-02-02 NOTE — Progress Notes (Signed)
REQUESTED STRESS TEST, ECHO, EKG, OV, FROM DR. GANJI'S OFFICE.

## 2017-02-03 ENCOUNTER — Encounter (HOSPITAL_COMMUNITY): Payer: Self-pay

## 2017-02-03 NOTE — Progress Notes (Addendum)
Anesthesia Chart Review: Patient is a 56 year old female scheduled for L5-S1 PLIF on 02/06/17 by Dr. Julio SicksHenry Pool.  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 Jacinto HalimGanji).      - PCP is Dr. Aida PufferJames Little. - Cardiologist is Dr. Yates DecampJay Gan[facility]), last visit 11/11/16 for palpitations follow-up. 2 week holter monitor in 2017 showed asymptomatic 4 beat and 3 beat run of ventricular tachycardia. 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 She was "placed on verapamil in view of normal nuclear stress test and normal echocardiogram with LVEF being normal." She was tolerating verapamil. Blood pressure was controlled. Smoking cessation encouraged. 3 month follow-up planned and if she remained stable then PRN cardiology follow-up would be recommended. She told him that she may require back surgery in the near future.  Meds include Wellbutrin, Celexa, Klonopin, Flexeril, Neurontin, Norco, lovastatin, Nicoderm CQ patch, Prilosec, promethazine, Imitrex, verapamil.  BP 139/79   Pulse 84   Temp 36.6 C   Resp 20   Ht 5\' 3"  (1.6 m)   Wt 136 lb 14.4 oz (62.1 kg)   SpO2 97%   BMI 24.25 kg/m   EKG 02/02/17: NSR, cannot rule out inferior infarct (age undetermined). The interpreting cardiologist did not feel tracing was significantly changes since last tracing (12/09/15).  Echo 07/21/15: Conclusion: 1. Left ventricle cavity is normal in size. Mild concentric hypertrophy of the left ventricle. Normal global wall motion. Normal diastolic filling pattern. Capillary EF 56%. 2. Left atrial cavity is normal size. 3. Trace mitral regurgitation. 4. Trace pulmonic  regurgitation.  Nuclear stress test 07/20/15 [facility]): Impression: 1. The resting electrocardiogram demonstrated normal sinus rhythm, normal resting conduction, no resting arrhythmias and normal rest repolarization. The stress electrocardiogram was normal. Patient exercised on Bruce protocol for 8:35 minutes and achieved 10.16 METS. Stress test terminated due to target heart rate (86% MPHR). Stress symptoms included dyspnea, dizziness. The stress test was terminated because of fatigue.  2. Myocardial perfusion imaging is normal. Overall left ventricular systolic function was normal without regional wall motion abnormalities. The left ventricular ejection fraction was 54%.   14 day Event monitor 07/07/15: Symptomatic lightheadedness revealed normal sinus rhythm. Asymptomatic 4 beat and 3 beat run of ventricular tachycardia.   Preoperative labs noted.   Patient with recent cardiology visit and cardiac testing last year. She remains on verapamil. If no cute changes then I would anticipate that she can proceed as planned.  Velna Ochsllison Midori Dado, PA-C [facility]Anesthesiology Phone [phone] 02/03/2017 2:39 PM

## 2017-02-06 ENCOUNTER — Inpatient Hospital Stay (HOSPITAL_COMMUNITY): Payer: BLUE CROSS/BLUE SHIELD | Admitting: Vascular Surgery

## 2017-02-06 ENCOUNTER — Inpatient Hospital Stay (HOSPITAL_COMMUNITY)
Admission: RE | Admit: 2017-02-06 | Discharge: 2017-02-07 | DRG: 455 | Disposition: A | Discharge status: Home / Self Care | Payer: BLUE CROSS/BLUE SHIELD | Source: Ambulatory Visit | Attending: Neurosurgery | Admitting: Neurosurgery

## 2017-02-06 ENCOUNTER — Encounter (HOSPITAL_COMMUNITY)
Admission: RE | Disposition: A | Discharge status: Home / Self Care | Payer: Self-pay | Source: Ambulatory Visit | Attending: Neurosurgery

## 2017-02-06 ENCOUNTER — Encounter (HOSPITAL_COMMUNITY): Payer: Self-pay | Admitting: Anesthesiology

## 2017-02-06 ENCOUNTER — Inpatient Hospital Stay (HOSPITAL_COMMUNITY): Payer: BLUE CROSS/BLUE SHIELD

## 2017-02-06 DIAGNOSIS — K219 Gastro-esophageal reflux disease without esophagitis: Secondary | ICD-10-CM | POA: Diagnosis present

## 2017-02-06 DIAGNOSIS — M5127 Other intervertebral disc displacement, lumbosacral region: Secondary | ICD-10-CM | POA: Diagnosis present

## 2017-02-06 DIAGNOSIS — M4807 Spinal stenosis, lumbosacral region: Principal | ICD-10-CM | POA: Diagnosis present

## 2017-02-06 DIAGNOSIS — I10 Essential (primary) hypertension: Secondary | ICD-10-CM | POA: Diagnosis present

## 2017-02-06 DIAGNOSIS — F419 Anxiety disorder, unspecified: Secondary | ICD-10-CM | POA: Diagnosis present

## 2017-02-06 DIAGNOSIS — F329 Major depressive disorder, single episode, unspecified: Secondary | ICD-10-CM | POA: Diagnosis present

## 2017-02-06 DIAGNOSIS — G40909 Epilepsy, unspecified, not intractable, without status epilepticus: Secondary | ICD-10-CM | POA: Diagnosis present

## 2017-02-06 DIAGNOSIS — F17210 Nicotine dependence, cigarettes, uncomplicated: Secondary | ICD-10-CM | POA: Diagnosis present

## 2017-02-06 DIAGNOSIS — M5137 Other intervertebral disc degeneration, lumbosacral region: Secondary | ICD-10-CM | POA: Diagnosis present

## 2017-02-06 DIAGNOSIS — Z79899 Other long term (current) drug therapy: Secondary | ICD-10-CM

## 2017-02-06 DIAGNOSIS — Z419 Encounter for procedure for purposes other than remedying health state, unspecified: Secondary | ICD-10-CM

## 2017-02-06 DIAGNOSIS — M48062 Spinal stenosis, lumbar region with neurogenic claudication: Secondary | ICD-10-CM | POA: Diagnosis present

## 2017-02-06 DIAGNOSIS — Z885 Allergy status to narcotic agent status: Secondary | ICD-10-CM | POA: Diagnosis not present

## 2017-02-06 DIAGNOSIS — E7800 Pure hypercholesterolemia, unspecified: Secondary | ICD-10-CM | POA: Diagnosis present

## 2017-02-06 SURGERY — POSTERIOR LUMBAR FUSION 1 LEVEL
Anesthesia: General | Site: Spine Lumbar

## 2017-02-06 MED ORDER — HYDROMORPHONE HCL 1 MG/ML IJ SOLN
1.0000 mg | INTRAMUSCULAR | Status: DC | PRN
  Administered 2017-02-06: 1 mg via INTRAVENOUS
  Filled 2017-02-06: qty 1

## 2017-02-06 MED ORDER — HYDROCODONE-ACETAMINOPHEN 10-325 MG PO TABS: 1.0000 | ORAL_TABLET | ORAL | Status: DC | PRN

## 2017-02-06 MED ORDER — PROPOFOL 10 MG/ML IV BOLUS
INTRAVENOUS | Status: DC | PRN
  Administered 2017-02-06: 150 mg via INTRAVENOUS

## 2017-02-06 MED ORDER — VANCOMYCIN HCL 1000 MG IV SOLR
INTRAVENOUS | Status: DC | PRN
  Administered 2017-02-06: 1000 mg via TOPICAL

## 2017-02-06 MED ORDER — FENTANYL CITRATE (PF) 100 MCG/2ML IJ SOLN
25.0000 ug | INTRAMUSCULAR | Status: DC | PRN
  Administered 2017-02-06 (×2): 50 ug via INTRAVENOUS

## 2017-02-06 MED ORDER — CITALOPRAM HYDROBROMIDE 10 MG PO TABS
10.0000 mg | ORAL_TABLET | Freq: Every day | ORAL | Status: DC
  Administered 2017-02-06 – 2017-02-07 (×2): 10 mg via ORAL
  Filled 2017-02-06 (×2): qty 1

## 2017-02-06 MED ORDER — MIDAZOLAM HCL 5 MG/5ML IJ SOLN
INTRAMUSCULAR | Status: DC | PRN
  Administered 2017-02-06: 2 mg via INTRAVENOUS

## 2017-02-06 MED ORDER — LIDOCAINE 2% (20 MG/ML) 5 ML SYRINGE
INTRAMUSCULAR | Status: AC
  Filled 2017-02-06: qty 5

## 2017-02-06 MED ORDER — SODIUM CHLORIDE 0.9 % IV SOLN: 250.0000 mL | INTRAVENOUS | Status: DC

## 2017-02-06 MED ORDER — PROMETHAZINE HCL 25 MG PO TABS
25.0000 mg | ORAL_TABLET | Freq: Four times a day (QID) | ORAL | Status: DC | PRN
  Administered 2017-02-06: 25 mg via ORAL
  Filled 2017-02-06: qty 1

## 2017-02-06 MED ORDER — MENTHOL 3 MG MT LOZG: 1.0000 | LOZENGE | OROMUCOSAL | Status: DC | PRN

## 2017-02-06 MED ORDER — FLEET ENEMA 7-19 GM/118ML RE ENEM: 1.0000 | ENEMA | Freq: Once | RECTAL | Status: DC | PRN

## 2017-02-06 MED ORDER — BUPROPION HCL ER (SR) 150 MG PO TB12
150.0000 mg | ORAL_TABLET | Freq: Two times a day (BID) | ORAL | Status: DC
  Administered 2017-02-06 – 2017-02-07 (×2): 150 mg via ORAL
  Filled 2017-02-06 (×2): qty 1

## 2017-02-06 MED ORDER — PHENOL 1.4 % MT LIQD: 1.0000 | OROMUCOSAL | Status: DC | PRN

## 2017-02-06 MED ORDER — ACETAMINOPHEN 325 MG PO TABS
650.0000 mg | ORAL_TABLET | ORAL | Status: DC | PRN
  Administered 2017-02-06: 650 mg via ORAL

## 2017-02-06 MED ORDER — ONDANSETRON HCL 4 MG/2ML IJ SOLN
INTRAMUSCULAR | Status: AC
  Filled 2017-02-06: qty 2

## 2017-02-06 MED ORDER — GABAPENTIN 300 MG PO CAPS
300.0000 mg | ORAL_CAPSULE | Freq: Three times a day (TID) | ORAL | Status: DC
  Administered 2017-02-06 – 2017-02-07 (×3): 300 mg via ORAL
  Filled 2017-02-06 (×3): qty 1

## 2017-02-06 MED ORDER — BUPIVACAINE HCL (PF) 0.25 % IJ SOLN
INTRAMUSCULAR | Status: DC | PRN
  Administered 2017-02-06: 20 mL

## 2017-02-06 MED ORDER — ONDANSETRON HCL 4 MG/2ML IJ SOLN: 4.0000 mg | Freq: Four times a day (QID) | INTRAMUSCULAR | Status: DC | PRN

## 2017-02-06 MED ORDER — NICOTINE 14 MG/24HR TD PT24
14.0000 mg | MEDICATED_PATCH | TRANSDERMAL | Status: DC
  Filled 2017-02-06: qty 1

## 2017-02-06 MED ORDER — DIAZEPAM 5 MG PO TABS
5.0000 mg | ORAL_TABLET | Freq: Four times a day (QID) | ORAL | Status: DC | PRN
Start: 2017-02-06 — End: 2017-02-07
  Administered 2017-02-06: 5 mg via ORAL
  Filled 2017-02-06: qty 1

## 2017-02-06 MED ORDER — ACETAMINOPHEN 325 MG PO TABS
ORAL_TABLET | ORAL | Status: AC
  Administered 2017-02-06: 650 mg via ORAL
  Filled 2017-02-06: qty 2

## 2017-02-06 MED ORDER — EPHEDRINE 5 MG/ML INJ
INTRAVENOUS | Status: AC
  Filled 2017-02-06: qty 10

## 2017-02-06 MED ORDER — FENTANYL CITRATE (PF) 100 MCG/2ML IJ SOLN
INTRAMUSCULAR | Status: AC
  Administered 2017-02-06: 50 ug via INTRAVENOUS
  Filled 2017-02-06: qty 2

## 2017-02-06 MED ORDER — OXYCODONE HCL 5 MG PO TABS
10.0000 mg | ORAL_TABLET | ORAL | Status: DC | PRN
Start: 2017-02-06 — End: 2017-02-07
  Administered 2017-02-06 – 2017-02-07 (×5): 10 mg via ORAL
  Filled 2017-02-06 (×4): qty 2

## 2017-02-06 MED ORDER — MIDAZOLAM HCL 2 MG/2ML IJ SOLN
INTRAMUSCULAR | Status: AC
  Filled 2017-02-06: qty 2

## 2017-02-06 MED ORDER — DEXAMETHASONE SODIUM PHOSPHATE 10 MG/ML IJ SOLN
INTRAMUSCULAR | Status: AC
  Filled 2017-02-06: qty 1

## 2017-02-06 MED ORDER — FENTANYL CITRATE (PF) 100 MCG/2ML IJ SOLN
INTRAMUSCULAR | Status: DC | PRN
  Administered 2017-02-06: 50 ug via INTRAVENOUS
  Administered 2017-02-06: 100 ug via INTRAVENOUS

## 2017-02-06 MED ORDER — BUPIVACAINE HCL (PF) 0.25 % IJ SOLN
INTRAMUSCULAR | Status: AC
  Filled 2017-02-06: qty 30

## 2017-02-06 MED ORDER — CEFAZOLIN SODIUM-DEXTROSE 1-4 GM/50ML-% IV SOLN
1.0000 g | Freq: Three times a day (TID) | INTRAVENOUS | Status: AC
  Administered 2017-02-06 – 2017-02-07 (×2): 1 g via INTRAVENOUS
  Filled 2017-02-06 (×2): qty 50

## 2017-02-06 MED ORDER — VANCOMYCIN HCL 1000 MG IV SOLR
INTRAVENOUS | Status: AC
  Filled 2017-02-06: qty 1000

## 2017-02-06 MED ORDER — DIAZEPAM 5 MG PO TABS
ORAL_TABLET | ORAL | Status: AC
  Administered 2017-02-06: 5 mg via ORAL
  Filled 2017-02-06: qty 1

## 2017-02-06 MED ORDER — VERAPAMIL HCL ER 180 MG PO TBCR
180.0000 mg | EXTENDED_RELEASE_TABLET | Freq: Every day | ORAL | Status: DC
  Administered 2017-02-06 – 2017-02-07 (×2): 180 mg via ORAL
  Filled 2017-02-06 (×2): qty 1

## 2017-02-06 MED ORDER — SCOPOLAMINE 1 MG/3DAYS TD PT72
MEDICATED_PATCH | TRANSDERMAL | Status: AC
  Filled 2017-02-06: qty 1

## 2017-02-06 MED ORDER — CYCLOBENZAPRINE HCL 10 MG PO TABS
10.0000 mg | ORAL_TABLET | Freq: Three times a day (TID) | ORAL | Status: DC
  Administered 2017-02-06 – 2017-02-07 (×3): 10 mg via ORAL
  Filled 2017-02-06 (×3): qty 1

## 2017-02-06 MED ORDER — ONDANSETRON HCL 4 MG PO TABS: 4.0000 mg | ORAL_TABLET | Freq: Four times a day (QID) | ORAL | Status: DC | PRN

## 2017-02-06 MED ORDER — FENTANYL CITRATE (PF) 250 MCG/5ML IJ SOLN
INTRAMUSCULAR | Status: AC
  Filled 2017-02-06: qty 5

## 2017-02-06 MED ORDER — BACITRACIN 50000 UNITS IM SOLR
INTRAMUSCULAR | Status: DC | PRN
  Administered 2017-02-06: 11:00:00

## 2017-02-06 MED ORDER — CLONAZEPAM 0.5 MG PO TABS
0.5000 mg | ORAL_TABLET | Freq: Two times a day (BID) | ORAL | Status: DC
  Administered 2017-02-06 – 2017-02-07 (×2): 0.5 mg via ORAL
  Filled 2017-02-06 (×2): qty 1

## 2017-02-06 MED ORDER — CHLORHEXIDINE GLUCONATE CLOTH 2 % EX PADS: 6.0000 | MEDICATED_PAD | Freq: Once | CUTANEOUS | Status: DC

## 2017-02-06 MED ORDER — KETAMINE HCL-SODIUM CHLORIDE 100-0.9 MG/10ML-% IV SOSY
PREFILLED_SYRINGE | INTRAVENOUS | Status: AC
  Filled 2017-02-06: qty 10

## 2017-02-06 MED ORDER — LIDOCAINE HCL (CARDIAC) 20 MG/ML IV SOLN
INTRAVENOUS | Status: DC | PRN
  Administered 2017-02-06: 70 mg via INTRAVENOUS

## 2017-02-06 MED ORDER — PANTOPRAZOLE SODIUM 40 MG PO TBEC
40.0000 mg | DELAYED_RELEASE_TABLET | Freq: Every day | ORAL | Status: DC
  Administered 2017-02-06 – 2017-02-07 (×2): 40 mg via ORAL
  Filled 2017-02-06 (×2): qty 1

## 2017-02-06 MED ORDER — PROPOFOL 10 MG/ML IV BOLUS
INTRAVENOUS | Status: AC
  Filled 2017-02-06: qty 20

## 2017-02-06 MED ORDER — ROCURONIUM BROMIDE 100 MG/10ML IV SOLN
INTRAVENOUS | Status: DC | PRN
  Administered 2017-02-06: 50 mg via INTRAVENOUS

## 2017-02-06 MED ORDER — SUMATRIPTAN SUCCINATE 50 MG PO TABS: 100.0000 mg | ORAL_TABLET | ORAL | Status: DC | PRN

## 2017-02-06 MED ORDER — BISACODYL 10 MG RE SUPP: 10.0000 mg | Freq: Every day | RECTAL | Status: DC | PRN

## 2017-02-06 MED ORDER — CEFAZOLIN SODIUM-DEXTROSE 2-4 GM/100ML-% IV SOLN
INTRAVENOUS | Status: AC
  Filled 2017-02-06: qty 100

## 2017-02-06 MED ORDER — SUGAMMADEX SODIUM 200 MG/2ML IV SOLN
INTRAVENOUS | Status: AC
  Filled 2017-02-06: qty 2

## 2017-02-06 MED ORDER — SODIUM CHLORIDE 0.9% FLUSH
3.0000 mL | Freq: Two times a day (BID) | INTRAVENOUS | Status: DC
  Administered 2017-02-06: 3 mL via INTRAVENOUS

## 2017-02-06 MED ORDER — DEXAMETHASONE SODIUM PHOSPHATE 10 MG/ML IJ SOLN
INTRAMUSCULAR | Status: DC | PRN
  Administered 2017-02-06: 10 mg via INTRAVENOUS

## 2017-02-06 MED ORDER — EPHEDRINE SULFATE 50 MG/ML IJ SOLN
INTRAMUSCULAR | Status: DC | PRN
  Administered 2017-02-06 (×3): 5 mg via INTRAVENOUS
  Administered 2017-02-06: 10 mg via INTRAVENOUS

## 2017-02-06 MED ORDER — KETAMINE HCL 10 MG/ML IJ SOLN
INTRAMUSCULAR | Status: DC | PRN
  Administered 2017-02-06: 10 mg via INTRAVENOUS
  Administered 2017-02-06: 30 mg via INTRAVENOUS

## 2017-02-06 MED ORDER — POLYETHYLENE GLYCOL 3350 17 G PO PACK: 17.0000 g | PACK | Freq: Every day | ORAL | Status: DC | PRN

## 2017-02-06 MED ORDER — 0.9 % SODIUM CHLORIDE (POUR BTL) OPTIME
TOPICAL | Status: DC | PRN
  Administered 2017-02-06: 1000 mL

## 2017-02-06 MED ORDER — PHENYLEPHRINE HCL 10 MG/ML IJ SOLN
INTRAMUSCULAR | Status: DC | PRN
  Administered 2017-02-06: 25 ug/min via INTRAVENOUS

## 2017-02-06 MED ORDER — CEFAZOLIN SODIUM-DEXTROSE 2-4 GM/100ML-% IV SOLN
2.0000 g | INTRAVENOUS | Status: AC
  Administered 2017-02-06: 2 g via INTRAVENOUS

## 2017-02-06 MED ORDER — PRAVASTATIN SODIUM 40 MG PO TABS
20.0000 mg | ORAL_TABLET | Freq: Every day | ORAL | Status: DC
  Administered 2017-02-06: 20 mg via ORAL
  Filled 2017-02-06: qty 1

## 2017-02-06 MED ORDER — LACTATED RINGERS IV SOLN
INTRAVENOUS | Status: DC | PRN
  Administered 2017-02-06 (×2): via INTRAVENOUS

## 2017-02-06 MED ORDER — THROMBIN (RECOMBINANT) 20000 UNITS EX SOLR
CUTANEOUS | Status: AC
  Filled 2017-02-06: qty 20000

## 2017-02-06 MED ORDER — GLYCOPYRROLATE 0.2 MG/ML IJ SOLN
INTRAMUSCULAR | Status: DC | PRN
  Administered 2017-02-06: 0.2 mg via INTRAVENOUS

## 2017-02-06 MED ORDER — ACETAMINOPHEN 650 MG RE SUPP: 650.0000 mg | RECTAL | Status: DC | PRN

## 2017-02-06 MED ORDER — THROMBIN (RECOMBINANT) 20000 UNITS EX SOLR
CUTANEOUS | Status: DC | PRN
  Administered 2017-02-06: 11:00:00 via TOPICAL

## 2017-02-06 MED ORDER — ROCURONIUM BROMIDE 10 MG/ML (PF) SYRINGE
PREFILLED_SYRINGE | INTRAVENOUS | Status: AC
  Filled 2017-02-06: qty 5

## 2017-02-06 MED ORDER — OXYCODONE HCL 5 MG PO TABS
ORAL_TABLET | ORAL | Status: AC
  Administered 2017-02-06: 10 mg via ORAL
  Filled 2017-02-06: qty 2

## 2017-02-06 MED ORDER — SODIUM CHLORIDE 0.9% FLUSH: 3.0000 mL | INTRAVENOUS | Status: DC | PRN

## 2017-02-06 MED ORDER — SUGAMMADEX SODIUM 200 MG/2ML IV SOLN
INTRAVENOUS | Status: DC | PRN
  Administered 2017-02-06: 200 mg via INTRAVENOUS

## 2017-02-06 MED ORDER — PHENYLEPHRINE HCL 10 MG/ML IJ SOLN
INTRAMUSCULAR | Status: AC
  Filled 2017-02-06: qty 1

## 2017-02-06 MED ORDER — ONDANSETRON HCL 4 MG/2ML IJ SOLN
INTRAMUSCULAR | Status: DC | PRN
  Administered 2017-02-06: 4 mg via INTRAVENOUS

## 2017-02-06 SURGICAL SUPPLY — 62 items
BAG DECANTER FOR FLEXI CONT (MISCELLANEOUS) ×3 IMPLANT
BENZOIN TINCTURE PRP APPL 2/3 (GAUZE/BANDAGES/DRESSINGS) ×3 IMPLANT
BLADE CLIPPER SURG (BLADE) IMPLANT
BUR CUTTER 7.0 ROUND (BURR) IMPLANT
BUR MATCHSTICK NEURO 3.0 LAGG (BURR) ×3 IMPLANT
CANISTER SUCT 3000ML PPV (MISCELLANEOUS) ×3 IMPLANT
CAP LCK SPNE (Orthopedic Implant) ×4 IMPLANT
CAP LOCK SPINE RADIUS (Orthopedic Implant) ×4 IMPLANT
CAP LOCKING (Orthopedic Implant) ×8 IMPLANT
CARTRIDGE OIL MAESTRO DRILL (MISCELLANEOUS) ×1 IMPLANT
CLOSURE WOUND 1/2 X4 (GAUZE/BANDAGES/DRESSINGS) ×1
CONT SPEC 4OZ CLIKSEAL STRL BL (MISCELLANEOUS) ×3 IMPLANT
COVER BACK TABLE 60X90IN (DRAPES) ×3 IMPLANT
DERMABOND ADVANCED (GAUZE/BANDAGES/DRESSINGS) ×2
DERMABOND ADVANCED .7 DNX12 (GAUZE/BANDAGES/DRESSINGS) ×1 IMPLANT
DEVICE INTERBODY ELEVATE 23X10 (Cage) ×6 IMPLANT
DIFFUSER DRILL AIR PNEUMATIC (MISCELLANEOUS) ×3 IMPLANT
DRAPE C-ARM 42X72 X-RAY (DRAPES) ×6 IMPLANT
DRAPE HALF SHEET 40X57 (DRAPES) IMPLANT
DRAPE LAPAROTOMY 100X72X124 (DRAPES) ×3 IMPLANT
DRAPE POUCH INSTRU U-SHP 10X18 (DRAPES) ×3 IMPLANT
DRAPE SURG 17X23 STRL (DRAPES) ×12 IMPLANT
DRSG OPSITE POSTOP 4X6 (GAUZE/BANDAGES/DRESSINGS) ×3 IMPLANT
DURAPREP 26ML APPLICATOR (WOUND CARE) ×3 IMPLANT
ELECT REM PT RETURN 9FT ADLT (ELECTROSURGICAL) ×3
ELECTRODE REM PT RTRN 9FT ADLT (ELECTROSURGICAL) ×1 IMPLANT
EVACUATOR 1/8 PVC DRAIN (DRAIN) ×3 IMPLANT
GAUZE SPONGE 4X4 12PLY STRL (GAUZE/BANDAGES/DRESSINGS) IMPLANT
GLOVE BIOGEL PI IND STRL 7.0 (GLOVE) ×2 IMPLANT
GLOVE BIOGEL PI IND STRL 7.5 (GLOVE) ×2 IMPLANT
GLOVE BIOGEL PI INDICATOR 7.0 (GLOVE) ×4
GLOVE BIOGEL PI INDICATOR 7.5 (GLOVE) ×4
GLOVE ECLIPSE 9.0 STRL (GLOVE) ×6 IMPLANT
GLOVE SURG SS PI 7.5 STRL IVOR (GLOVE) ×9 IMPLANT
GOWN STRL REUS W/ TWL LRG LVL3 (GOWN DISPOSABLE) ×2 IMPLANT
GOWN STRL REUS W/ TWL XL LVL3 (GOWN DISPOSABLE) ×3 IMPLANT
GOWN STRL REUS W/TWL 2XL LVL3 (GOWN DISPOSABLE) IMPLANT
GOWN STRL REUS W/TWL LRG LVL3 (GOWN DISPOSABLE) ×4
GOWN STRL REUS W/TWL XL LVL3 (GOWN DISPOSABLE) ×6
GRAFT BN 5X1XDBM MAGNIFUSE (Bone Implant) ×1 IMPLANT
GRAFT BONE MAGNIFUSE 1X5CM (Bone Implant) ×2 IMPLANT
KIT BASIN OR (CUSTOM PROCEDURE TRAY) ×3 IMPLANT
KIT ROOM TURNOVER OR (KITS) ×3 IMPLANT
MILL MEDIUM DISP (BLADE) ×3 IMPLANT
NEEDLE HYPO 22GX1.5 SAFETY (NEEDLE) ×3 IMPLANT
NS IRRIG 1000ML POUR BTL (IV SOLUTION) ×3 IMPLANT
OIL CARTRIDGE MAESTRO DRILL (MISCELLANEOUS) ×3
PACK LAMINECTOMY NEURO (CUSTOM PROCEDURE TRAY) ×3 IMPLANT
ROD RADIUS 40MM (Neuro Prosthesis/Implant) ×4 IMPLANT
ROD SPNL 40X5.5XNS TI RDS (Neuro Prosthesis/Implant) ×2 IMPLANT
SCREW 5.75X40M (Screw) ×6 IMPLANT
SCREW 5.75X45MM (Screw) ×6 IMPLANT
SPONGE SURGIFOAM ABS GEL 100 (HEMOSTASIS) ×3 IMPLANT
STRIP CLOSURE SKIN 1/2X4 (GAUZE/BANDAGES/DRESSINGS) ×2 IMPLANT
SUT VIC AB 0 CT1 18XCR BRD8 (SUTURE) ×2 IMPLANT
SUT VIC AB 0 CT1 8-18 (SUTURE) ×4
SUT VIC AB 2-0 CT1 18 (SUTURE) ×3 IMPLANT
SUT VIC AB 3-0 SH 8-18 (SUTURE) ×6 IMPLANT
TOWEL GREEN STERILE (TOWEL DISPOSABLE) ×3 IMPLANT
TOWEL GREEN STERILE FF (TOWEL DISPOSABLE) ×3 IMPLANT
TRAY FOLEY W/METER SILVER 16FR (SET/KITS/TRAYS/PACK) ×3 IMPLANT
WATER STERILE IRR 1000ML POUR (IV SOLUTION) ×3 IMPLANT

## 2017-02-06 NOTE — Brief Op Note (Signed)
02/06/2017  11:44 AM  PATIENT:  Regina Perry  56 y.o. female  PRE-OPERATIVE DIAGNOSIS:  Spinal stenosis, Lumbar region with neurogenic claudication  POST-OPERATIVE DIAGNOSIS:  Spinal stenosis, Lumbar region with neurogenic claudication  PROCEDURE:  Procedure(s) with comments: Lumbar Five-Sacral One Posterior Lumbar Interbody Fusion (N/A) - L5-S1 Posterior lumbar interbody fusion  SURGEON:  Surgeon(s) and Role:    * Julio SicksPool, Millisa Giarrusso, MD - Primary    * Ditty, Loura HaltBenjamin Jared, MD - Assisting  PHYSICIAN ASSISTANT:   ASSISTANTS:    ANESTHESIA:   general  EBL:  50 mL   BLOOD ADMINISTERED:none  DRAINS: none   LOCAL MEDICATIONS USED:  MARCAINE     SPECIMEN:  No Specimen  DISPOSITION OF SPECIMEN:  N/A  COUNTS:  YES  TOURNIQUET:  * No tourniquets in log *  DICTATION: .Dragon Dictation  PLAN OF CARE: Admit to inpatient   PATIENT DISPOSITION:  PACU - hemodynamically stable.   Delay start of Pharmacological VTE agent (>24hrs) due to surgical blood loss or risk of bleeding: yes

## 2017-02-06 NOTE — Evaluation (Signed)
Physical Therapy Evaluation Patient Details Name: Regina SanesLisa J Kingsbury MRN: [id number] DOB: Feb 23, 1961 Today's Date: 02/06/2017   History of Present Illness  Patient is a 56 yo female s/p Lumbar Five-Sacral One Posterior Lumbar Interbody Fusion   Clinical Impression  Orders received for PT evaluation. Patient demonstrates deficits in functional mobility as indicated below. Will benefit from continued skilled PT to address deficits and maximize function. Will see as indicated and progress as tolerated.  Will see for further education WU:[id number]:spinal precautions and car transfers next am.    Follow Up Recommendations No PT follow up    Equipment Recommendations  None recommended by PT    Recommendations for Other Services       Precautions / Restrictions Precautions Precautions: Back Precaution Booklet Issued: Yes (comment) Precaution Comments: verbally reviewed with patient Required Braces or Orthoses: Spinal Brace Spinal Brace: Lumbar corset      Mobility  Bed Mobility Overal bed mobility: Needs Assistance Bed Mobility: Rolling;Sidelying to Sit;Sit to Sidelying Rolling: Supervision Sidelying to sit: Supervision     Sit to sidelying: Supervision General bed mobility comments: Vcs for technique and sequencing  Transfers Overall transfer level: Needs assistance Equipment used: 1 person hand held assist Transfers: Sit to/from Stand Sit to Stand: Min assist         General transfer comment: min assist for stbaility during power up to stnading  Ambulation/Gait Ambulation/Gait assistance: Min assist Ambulation Distance (Feet): 120 Feet Assistive device: 1 person hand held assist Gait Pattern/deviations: Step-through pattern;Decreased stride length;Narrow base of support Gait velocity: decreased   General Gait Details: VCs for increased gait speed, some instability noted. HHA provided  Stairs Stairs: Yes Stairs assistance: Min assist Stair Management: Two rails Number of  Stairs: 8 General stair comments: Vcs for technique and sequencing  Wheelchair Mobility    Modified Rankin (Stroke Patients Only)       Balance                                             Pertinent Vitals/Pain Pain Assessment: No/denies pain    Home Living Family/patient expects to be discharged to:: Private residence Living Arrangements: Spouse/significant other Available Help at Discharge: Family Type of Home: House Home Access: Stairs to enter Entrance Stairs-Rails: None Secretary/administratorntrance Stairs-Number of Steps: 2 Home Layout: Two level;Bed/bath upstairs Home Equipment: None      Prior Function Level of Independence: Independent               Hand Dominance   Dominant Hand: Right    Extremity/Trunk Assessment   Upper Extremity Assessment Upper Extremity Assessment: Overall WFL for tasks assessed    Lower Extremity Assessment Lower Extremity Assessment: Overall WFL for tasks assessed    Cervical / Trunk Assessment Cervical / Trunk Assessment: (s/p spinal surgery)  Communication   Communication: No difficulties  Cognition Arousal/Alertness: Awake/alert Behavior During Therapy: WFL for tasks assessed/performed Overall Cognitive Status: Within Functional Limits for tasks assessed                                        General Comments      Exercises     Assessment/Plan    PT Assessment Patient needs continued PT services  PT Problem List Decreased activity tolerance;Decreased balance;Decreased mobility;Decreased knowledge of precautions  PT Treatment Interventions DME instruction;Gait training;Stair training;Functional mobility training;Therapeutic activities;Therapeutic exercise;Balance training;Patient/family education    PT Goals (Current goals can be found in the Care Plan section)  Acute Rehab PT Goals Patient Stated Goal: to go home PT Goal Formulation: With patient Time For Goal Achievement:  02/20/17 Potential to Achieve Goals: Good    Frequency Min 5X/week   Barriers to discharge        Co-evaluation               AM-PAC PT "6 Clicks" Daily Activity  Outcome Measure Difficulty turning over in bed (including adjusting bedclothes, sheets and blankets)?: A Little Difficulty moving from lying on back to sitting on the side of the bed? : A Little Difficulty sitting down on and standing up from a chair with arms (e.g., wheelchair, bedside commode, etc,.)?: A Little Help needed moving to and from a bed to chair (including a wheelchair)?: A Little Help needed walking in hospital room?: A Little Help needed climbing 3-5 steps with a railing? : A Little 6 Click Score: 18    End of Session Equipment Utilized During Treatment: Back brace Activity Tolerance: Patient tolerated treatment well Patient left: in bed;with call bell/phone within reach;with SCD's reapplied Nurse Communication: Mobility status PT Visit Diagnosis: Unsteadiness on feet (R26.81);Difficulty in walking, not elsewhere classified (R26.2)    Time: 1530-1550 PT Time Calculation (min) (ACUTE ONLY): 20 min   Charges:   PT Evaluation $PT Eval Moderate Complexity: 1 Mod     PT G Codes:   PT G-Codes **NOT FOR INPATIENT CLASS** Functional Assessment Tool Used: Clinical judgement Functional Limitation: Mobility: Walking and moving around Mobility: Walking and Moving Around Current Status ([id number](G8978): At least 1 percent but less than 20 percent impaired, limited or restricted Mobility: Walking and Moving Around Goal Status [phone](G8979): 0 percent impaired, limited or restricted    Charlotte Crumbevon Desmond Tufano, PT DPT  Board Certified Neurologic Specialist [phone]   Fabio AsaDevon J Brenlee Koskela 02/06/2017, 4:01 PM

## 2017-02-06 NOTE — Anesthesia Procedure Notes (Signed)
Procedure Name: Intubation Date/Time: 02/06/2017 9:57 AM Performed by: Lovie Cholock, Leanore Biggers K, CRNA Pre-anesthesia Checklist: Patient identified, Emergency Drugs available, Suction available and Patient being monitored Patient Re-evaluated:Patient Re-evaluated prior to induction Oxygen Delivery Method: Circle System Utilized Preoxygenation: Pre-oxygenation with 100% oxygen Induction Type: IV induction Ventilation: Mask ventilation without difficulty Laryngoscope Size: Miller and 2 Grade View: Grade I Tube type: Oral Tube size: 7.0 mm Number of attempts: 1 Airway Equipment and Method: Stylet and Oral airway Placement Confirmation: ETT inserted through vocal cords under direct vision,  positive ETCO2 and breath sounds checked- equal and bilateral Secured at: 21 cm Tube secured with: Tape Dental Injury: Teeth and Oropharynx as per pre-operative assessment

## 2017-02-06 NOTE — Transfer of Care (Signed)
Immediate Anesthesia Transfer of Care Note  Patient: [name] Mestas  Procedure(s) Performed: Lumbar Five-Sacral One Posterior Lumbar Interbody Fusion (N/A Spine Lumbar)  Patient Location: PACU  Anesthesia Type:General  Level of Consciousness: drowsy  Airway & Oxygen Therapy: Patient Spontanous Breathing and Patient connected to nasal cannula oxygen  Post-op Assessment: Report given to RN and Post -op Vital signs reviewed and stable  Post vital signs: Reviewed and stable  Last Vitals:  Vitals:   02/06/17 0750 02/06/17 1200  BP: 122/78 106/61  Pulse: 72 74  Resp: 20 15  Temp: (!) 36.2 C (!) 36.1 C  SpO2: 97% 98%    Last Pain:  Vitals:   02/06/17 1200  TempSrc:   PainSc: (P) 0-No pain         Complications: No apparent anesthesia complications

## 2017-02-06 NOTE — Anesthesia Postprocedure Evaluation (Signed)
Anesthesia Post Note  Patient: Regina Perry  Procedure(s) Performed: Lumbar Five-Sacral One Posterior Lumbar Interbody Fusion (N/A Spine Lumbar)     Patient location during evaluation: PACU Anesthesia Type: General Level of consciousness: awake Pain management: pain level controlled Vital Signs Assessment: post-procedure vital signs reviewed and stable Respiratory status: spontaneous breathing Cardiovascular status: stable Anesthetic complications: no    Last Vitals:  Vitals:   02/06/17 1445 02/06/17 1500  BP: (!) 148/57   Pulse: 69 86  Resp: 17 16  Temp:  36.7 C  SpO2: 93% 97%    Last Pain:  Vitals:   02/06/17 1430  TempSrc:   PainSc: Asleep                 Carlei Huang

## 2017-02-06 NOTE — H&P (Signed)
Regina Perry is an 56 y.o. female.   Chief Complaint: Back pain HPI: 56 year old female with intractable back pain with bilateral lower extremity symptoms failing conservative management. Workup demonstrates evidence of transitional lumbosacral anatomy with significant disc generation and broad-based central disc herniation at would is being called L5-S1 (her first true caudal motion segment). Patient presents now for decompression and fusion at L5-S1 in hopes of mptoms.  Past Medical History:  Diagnosis Date  . Anxiety   . Bronchitis   . Depression   . Diverticulitis   . GERD (gastroesophageal reflux disease)   . H/O cesarean section   . History of hiatal hernia   . History of kidney stones    LEFT KIDNEY 2 STONES JUST WATCHING( [facility])  . Hypercholesteremia   . Hypertension   . Migraine   . 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 Yates DecampJay Ganji), prescribed verapamil  . Pneumonia   . PONV (postoperative nausea and vomiting)     Past Surgical History:  Procedure Laterality Date  . ABDOMINAL HYSTERECTOMY     partial  . CESAREAN SECTION    . COLONOSCOPY    . SHOULDER ARTHROSCOPY W/ ROTATOR CUFF REPAIR  6/15   right  . TONSILLECTOMY AND ADENOIDECTOMY    . TUBAL LIGATION      Family History  Problem Relation Age of Onset  . Heart disease Mother        MI  . Irritable bowel syndrome Mother   . Migraines Mother   . Aneurysm Mother        brain  . Cerebral aneurysm Father   . AAA (abdominal aortic aneurysm) Sister   . Breast cancer Paternal Grandmother   . Lung cancer Paternal Grandfather   . Colon cancer Neg Hx   . Esophageal cancer Neg Hx   . Rectal cancer Neg Hx   . Stomach cancer Neg Hx    Social History:  reports that she has been smoking cigarettes.  She has a 35.00 pack-year smoking history. she has never used smokeless tobacco. She reports that she does not drink alcohol or  use drugs.  Allergies:  Allergies  Allergen Reactions  . Percocet [Oxycodone-Acetaminophen] Other (See Comments)    Sick on stomach    Medications Prior to Admission  Medication Sig Dispense Refill  . buPROPion (WELLBUTRIN SR) 150 MG 12 hr tablet Take 150 mg 2 (two) times daily by mouth.   1  . citalopram (CELEXA) 10 MG tablet Take 10 mg daily by mouth.    . clonazePAM (KLONOPIN) 0.5 MG tablet Take 0.5 mg 2 (two) times daily by mouth.    . cyclobenzaprine (FLEXERIL) 10 MG tablet Take 10 mg 3 (three) times daily by mouth.    . gabapentin (NEURONTIN) 300 MG capsule Take 1 capsule (300 mg total) by mouth 3 (three) times daily. 90 capsule 4  . HYDROcodone-acetaminophen (NORCO/VICODIN) 5-325 MG tablet Take 1 tablet by mouth 4 (four) times daily as needed for moderate pain.    Marland Kitchen. lovastatin (MEVACOR) 20 MG tablet Take 20 mg by mouth at bedtime.    . nicotine (NICODERM CQ - DOSED IN MG/24 HOURS) 14 mg/24hr patch Place 14 mg daily onto the skin.    Marland Kitchen. omeprazole (PRILOSEC) 40 MG capsule Take 1 capsule (40 mg total) by mouth daily. 30 capsule 5  . promethazine (PHENERGAN) 25 MG tablet Take 25 mg every 6 (six)  hours as needed by mouth for nausea or vomiting.    . SUMAtriptan (IMITREX) 100 MG tablet Take 100 mg every 2 (two) hours as needed by mouth for migraine.     . verapamil (CALAN-SR) 180 MG CR tablet Take 180 mg daily by mouth.   0  . glycopyrrolate (ROBINUL) 2 MG tablet Take 1 tablet (2 mg total) by mouth 2 (two) times daily. (Patient not taking: Reported on 01/30/2017) 60 tablet 5  . sucralfate (CARAFATE) 1 g tablet Take 1 tablet (1 g total) by mouth 4 (four) times daily -  with meals and at bedtime. (Patient not taking: Reported on 01/30/2017) 60 tablet 0    No results found for this or any previous visit (from the past 48 hour(s)). No results found.  Pertinent items noted in HPI and remainder of comprehensive ROS otherwise negative.  Blood pressure 122/78, pulse 72, temperature (!) 97.2 F  (36.2 C), temperature source Oral, resp. rate 20, weight 61.7 kg (136 lb), SpO2 97 %.  Patient is awake and alert. She is oriented and appropriate. Cranial nerve function intact. Motor and sensory functiextremities normal. Examination head ears eyes and throat are marked. Chest and abdomen are benign. Extremities are free from iformity. Assessment/Plan L5-S1 degenerative disc disease with central disc herniation, stenosis and intractable back pain. Plan Bilateral decompressivelaminotomies and foraminotomies followed by posterior lumbar interbody fusion utilizing interbody cages, local harvested autograft, and Coupled with posterior lateral arthrodesis utilizing nonsegmental pedicle screw fixation and local autografting. Risks and benefits of been explained. Patient wishes to proceed.  Charlis Harner A 02/06/2017, 9:36 AM

## 2017-02-06 NOTE — Anesthesia Preprocedure Evaluation (Addendum)
Anesthesia Evaluation  Patient identified by MRN, date of birth, ID band Patient awake    Reviewed: Allergy & Precautions, NPO status , Patient's Chart, lab work & pertinent test results  History of Anesthesia Complications (+) PONV and history of anesthetic complications  Airway Mallampati: II  TM Distance: >3 FB Neck ROM: Full    Dental  (+) Teeth Intact, Dental Advisory Given   Pulmonary pneumonia, Current Smoker,    breath sounds clear to auscultation       Cardiovascular hypertension, Pt. on medications  Rhythm:Regular Rate:Normal     Neuro/Psych PSYCHIATRIC DISORDERS Anxiety Depression    GI/Hepatic Neg liver ROS, hiatal hernia, GERD  Medicated and Controlled,  Endo/Other  negative endocrine ROS  Renal/GU negative Renal ROS     Musculoskeletal   Abdominal   Peds  Hematology   Anesthesia Other Findings   Reproductive/Obstetrics                           Anesthesia Physical Anesthesia Plan  ASA: III  Anesthesia Plan: General   Post-op Pain Management:    Induction: Intravenous  PONV Risk Score and Plan: 3 and Treatment may vary due to age or medical condition, Ondansetron and Dexamethasone  Airway Management Planned: Oral ETT  Additional Equipment:   Intra-op Plan:   Post-operative Plan: Possible Post-op intubation/ventilation  Informed Consent: I have reviewed the patients History and Physical, chart, labs and discussed the procedure including the risks, benefits and alternatives for the proposed anesthesia with the patient or authorized representative who has indicated his/her understanding and acceptance.   Dental advisory given  Plan Discussed with: CRNA and Anesthesiologist  Anesthesia Plan Comments:         Anesthesia Quick Evaluation

## 2017-02-06 NOTE — Op Note (Signed)
Date of procedure: 02/06/2017  Date of dictation: same  Service: Neurosurgery  Preoperative diagnosis: L5-S1 degenerative disc disease with herniated mucous pulposis and stenosis  Postoperative diagnosis: same  Procedure Name: bilateral L5-S1 decompressive laminotomies with foraminotomies, more than would be required for simple interbody fusion alone. L5-S1 posterior lumbar interbody fusion utilizing interbody cages and locally harvested autograft  L5-S1 posterior lateral arthrodesis utilizing nonsegmental pedicle screw fixation and local autograft and morcellized allograft  Surgeon:Nadyne Gariepy A.Devann Cribb, M.D.  Asst. Surgeon: Ditty  Anesthesia: General  Indication:patient is 56 year old female with chronic and progressive back and bilateral lower trimming symptoms failing conservative management her workup demonstrates evidence of transitional anatomy at her lumbosacral junction with disc space disruption and broad-based disc herniation with significant stenosis both within the lateral recess and neural foramen. Patient has failed conservative management presents now for decompression infusion in hopes of improving her symptoms.  Operative note:after induction of anesthesia, patient position prone onto Wilson frame and a properly padded. Lumbar region prepped and draped sterilely. Incision made overlying L5-S1. Dissection performed bilaterally. Retractor placed. Fluoroscopy used. Levels confirmed. Decompressive laminotomies performed using Leksell rongeurs Kerrison rongeurs and high-speed drill to remove the entire inferior facet and pars and articularis of L5 bilaterally. The superior aspect of the S1 lamina and the inferior two thirds of the L5 lamina as well as the majority of the superior facet of S1. Ligament flavum elevated and resected. Foraminotomies completed on the course the exiting L5 and S1 nerve roots bilaterally. Bilateral discectomies were performed including a large central disc  herniation. Disc spaces and prepared for interbody fusion. With the distractor placed the patient's right side disc space was cleaned of soft tissue on the left side. A 10 mm extra lordotic [employer] expandable cage was packed with locally harvested autograft and impacted into place. This is then expanded to its full extent. Distractor was removed patient's right side. Disc spaces and prepared on the right side. Morselized autograft was packed in the interspace. A second cage packed with autograft was impacted into place and expanded to its full extent. Pedicles of L5 and S1 were then identified using surface landmarks and intraoperative fluoroscopy. Superficial bone overlying the pedicle was then removed using high-speed drill. Each pedicles and probed using pedicle awl each pedicle awl track was then probed and found to be solidly within the bone. Each all track was then tapped with a screw tap. 5.75 mm radius brand screws from [employer] were placed bilaterally at L5 and S1. Final images revealed good position of the cages and the screws at the proper upper level with normal alignment spine. Wound is then irrigated with and like solution. Transverse processes and sacroiliac were decorticated. Morselize autograft was packed posterior laterally on the patient's left side and morselized allograft and a packet was placed in the patient's right side as well as with morselized autograft. Short segment titanium rods and placed over the screw heads at L5 and S1. Locking caps placed over the screws. Locking caps and engaged with the construct under compression. Wound is then irrigated one final time. Gelfoam was placed topically for hemostasis. Vancomycin powder was left in the deep wound space. Wounds and close in layers of Vicryl sutures. Steri-Strips and sterile dressing were applied. No apparent complications. Patient tolerated the procedure well and she returns to the recovery room postop.

## 2017-02-07 ENCOUNTER — Other Ambulatory Visit: Payer: Self-pay

## 2017-02-07 MED ORDER — OXYCODONE HCL 10 MG PO TABS: 10.0000 mg | ORAL_TABLET | ORAL | 0 refills | Status: DC | PRN

## 2017-02-07 MED ORDER — DIAZEPAM 5 MG PO TABS: 5.0000 mg | ORAL_TABLET | Freq: Four times a day (QID) | ORAL | 0 refills | Status: DC | PRN

## 2017-02-07 NOTE — Evaluation (Signed)
Occupational Therapy Evaluation Patient Details Name: Regina Perry MRN: [id number] DOB: 03-Jul-1960 Today's Date: 02/07/2017    History of Present Illness Patient is a 56 yo female s/p Lumbar Five-Sacral One Posterior Lumbar Interbody Fusion    Clinical Impression   This 56 y/o F presents with the above. At baseline Pt is independent with ADLs and functional mobility. Pt completed functional mobility, toilet transfers, and standing ADLs with MinGuard assist this session, requires MinA for LB ADLs secondary to adhering to back precautions. Pt will return home with spouse/family, reporting they will be able to assist with ADLs PRN. Education provided and questions answered throughout. Feel Pt will safely return home with available family assist. No further acute OT needs identified at this time. Will sign off.     Follow Up Recommendations  No OT follow up;Supervision/Assistance - 24 hour    Equipment Recommendations  None recommended by OT           Precautions / Restrictions Precautions Precautions: Back Precaution Booklet Issued: Yes (comment) Precaution Comments: verbally reviewed with patient Required Braces or Orthoses: Spinal Brace Spinal Brace: Lumbar corset;Applied in sitting position      Mobility Bed Mobility               General bed mobility comments: OOB with PT; Pt able to verbally state proper log roll technique   Transfers Overall transfer level: Needs assistance Equipment used: None Transfers: Sit to/from Stand Sit to Stand: Independent              Balance Overall balance assessment: Independent                                         ADL either performed or assessed with clinical judgement   ADL Overall ADL's : Needs assistance/impaired Eating/Feeding: Set up;Sitting   Grooming: Min guard;Standing   Upper Body Bathing: Min guard;Sitting   Lower Body Bathing: Minimal assistance;Sit to/from stand   Upper Body  Dressing : Min guard;Sitting   Lower Body Dressing: Minimal assistance;Sit to/from stand Lower Body Dressing Details (indicate cue type and reason): Pt able to don pants with close minguard, requires assist to don socks Toilet Transfer: Min guard;Ambulation;Regular Toilet;Grab bars   Toileting- Clothing Manipulation and Hygiene: Min guard;Sit to/from stand Toileting - Clothing Manipulation Details (indicate cue type and reason): educated on AE for completing pericare after task for increased adherence to precautions; Pt reports spouse will be able to assist PRN      Functional mobility during ADLs: Min guard General ADL Comments: reviewed back precautions and compensatory techniques/AE for completing ADLs while adhering to precautions                          Pertinent Vitals/Pain Pain Assessment: No/denies pain     Hand Dominance Right   Extremity/Trunk Assessment Upper Extremity Assessment Upper Extremity Assessment: Overall WFL for tasks assessed   Lower Extremity Assessment Lower Extremity Assessment: Defer to PT evaluation   Cervical / Trunk Assessment Cervical / Trunk Assessment: Other exceptions(s/p spinal surgery )   Communication Communication Communication: No difficulties   Cognition Arousal/Alertness: Awake/alert Behavior During Therapy: WFL for tasks assessed/performed Overall Cognitive Status: Within Functional Limits for tasks assessed  Home Living Family/patient expects to be discharged to:: Private residence Living Arrangements: Spouse/significant other Available Help at Discharge: Family Type of Home: House Home Access: Stairs to enter Secretary/administratorntrance Stairs-Number of Steps: 2 Entrance Stairs-Rails: None Home Layout: Two level;Bed/bath upstairs   Alternate Level Stairs-Rails: Can reach both Bathroom Shower/Tub: Producer, television/film/videoWalk-in shower   Bathroom Toilet: Standard     Home  Equipment: None          Prior Functioning/Environment Level of Independence: Independent                 OT Problem List: Decreased strength;Decreased activity tolerance;Pain;Decreased knowledge of use of DME or AE;Decreased knowledge of precautions      OT Treatment/Interventions:      OT Goals(Current goals can be found in the care plan section) Acute Rehab OT Goals Patient Stated Goal: to go home OT Goal Formulation: All assessment and education complete, DC therapy                                 AM-PAC PT "6 Clicks" Daily Activity     Outcome Measure Help from another person eating meals?: None Help from another person taking care of personal grooming?: None Help from another person toileting, which includes using toliet, bedpan, or urinal?: A Little Help from another person bathing (including washing, rinsing, drying)?: A Little Help from another person to put on and taking off regular upper body clothing?: None Help from another person to put on and taking off regular lower body clothing?: A Little 6 Click Score: 21   End of Session Equipment Utilized During Treatment: Back brace Nurse Communication: Mobility status  Activity Tolerance: Patient tolerated treatment well Patient left: in chair;with call bell/phone within reach  OT Visit Diagnosis: Other abnormalities of gait and mobility (R26.89)                Time: 1610-9604: 0831-0855 OT Time Calculation (min): 24 min Charges:  OT General Charges $OT Visit: 1 Visit OT Evaluation $OT Eval Low Complexity: 1 Low G-Codes:     Regina Perry, OT Pager [phone] 02/07/2017   Regina Perry 02/07/2017, 9:41 AM

## 2017-02-07 NOTE — Discharge Instructions (Signed)

## 2017-02-07 NOTE — Discharge Summary (Signed)
Physician Discharge Summary  Patient ID: Regina SanesLisa J Perry MRN: [id number] DOB/AGE: [birth year] 54 y.o.  Admit date: 02/06/2017 Discharge date: 02/07/2017  Admission Diagnoses:  Discharge Diagnoses:  Active Problems:   Lumbar stenosis with neurogenic claudication   Discharged Condition: good  Hospital Course: Patient admitted to hospital where she underwent an uncomplicated L5-S1 decompression and fusion. Postoperative. Well. Preoperative back and lower extremity pain much improved. Ambulating without difficulty. Ready for discharge home.  Consults:   Significant Diagnostic Studies:   Treatments:   Discharge Exam: Blood pressure 100/63, pulse 81, temperature 98.1 F (36.7 C), resp. rate 16, weight 61.7 kg (136 lb), SpO2 96 %. Awake and alert. Oriented and appropriate. Motor and sensory function intact. Wound clean and dry. Chest and abdomen benign.  Disposition: 01-Home or Self Care   Allergies as of 02/07/2017      Reactions   Percocet [oxycodone-acetaminophen] Other (See Comments)   Sick on stomach      Medication List    TAKE these medications   buPROPion 150 MG 12 hr tablet Commonly known as:  WELLBUTRIN SR Take 150 mg 2 (two) times daily by mouth.   citalopram 10 MG tablet Commonly known as:  CELEXA Take 10 mg daily by mouth.   clonazePAM 0.5 MG tablet Commonly known as:  KLONOPIN Take 0.5 mg 2 (two) times daily by mouth.   cyclobenzaprine 10 MG tablet Commonly known as:  FLEXERIL Take 10 mg 3 (three) times daily by mouth.   diazepam 5 MG tablet Commonly known as:  VALIUM Take 1-2 tablets (5-10 mg total) every 6 (six) hours as needed by mouth for muscle spasms.   gabapentin 300 MG capsule Commonly known as:  NEURONTIN Take 1 capsule (300 mg total) by mouth 3 (three) times daily.   HYDROcodone-acetaminophen 5-325 MG tablet Commonly known as:  NORCO/VICODIN Take 1 tablet by mouth 4 (four) times daily as needed for moderate pain.   lovastatin 20 MG  tablet Commonly known as:  MEVACOR Take 20 mg by mouth at bedtime.   nicotine 14 mg/24hr patch Commonly known as:  NICODERM CQ - dosed in mg/24 hours Place 14 mg daily onto the skin.   omeprazole 40 MG capsule Commonly known as:  PRILOSEC Take 1 capsule (40 mg total) by mouth daily.   Oxycodone HCl 10 MG Tabs Take 1 tablet (10 mg total) every 3 (three) hours as needed by mouth for severe pain ((score 7 to 10)).   promethazine 25 MG tablet Commonly known as:  PHENERGAN Take 25 mg every 6 (six) hours as needed by mouth for nausea or vomiting.   SUMAtriptan 100 MG tablet Commonly known as:  IMITREX Take 100 mg every 2 (two) hours as needed by mouth for migraine.   verapamil 180 MG CR tablet Commonly known as:  CALAN-SR Take 180 mg daily by mouth.            Durable Medical Equipment  (From admission, onward)        Start     Ordered   02/06/17 1524  DME Walker rolling  Once    Question:  Patient needs a walker to treat with the following condition  Answer:  Lumbar stenosis with neurogenic claudication   02/06/17 1523   02/06/17 1524  DME 3 n 1  Once     02/06/17 1523       Signed: Angell Pincock A 02/07/2017, 10:20 AM

## 2017-02-07 NOTE — Progress Notes (Signed)
Physical Therapy Treatment Patient Details Name: Regina SanesLisa J Slaven MRN: [id number] DOB: [birth year] Today's Date: 02/07/2017    History of Present Illness Patient is a 56 yo female s/p Lumbar Five-Sacral One Posterior Lumbar Interbody Fusion     PT Comments    Patient seen for mobility progression s/p spinal surgery. Mobilizing well. Educated patient on precautions, mobility expectations, safety and car transfers.     Follow Up Recommendations  No PT follow up     Equipment Recommendations  None recommended by PT    Recommendations for Other Services       Precautions / Restrictions Precautions Precautions: Back Precaution Booklet Issued: Yes (comment) Precaution Comments: verbally reviewed with patient Required Braces or Orthoses: Spinal Brace Spinal Brace: Lumbar corset    Mobility  Bed Mobility               General bed mobility comments: no physical assist required  Transfers Overall transfer level: Needs assistance Equipment used: None Transfers: Sit to/from Stand Sit to Stand: Independent            Ambulation/Gait Ambulation/Gait assistance: Independent Ambulation Distance (Feet): 380 Feet Assistive device: None Gait Pattern/deviations: WFL(Within Functional Limits)         Stairs            Wheelchair Mobility    Modified Rankin (Stroke Patients Only)       Balance Overall balance assessment: Independent                                          Cognition Arousal/Alertness: Awake/alert Behavior During Therapy: WFL for tasks assessed/performed Overall Cognitive Status: Within Functional Limits for tasks assessed                                        Exercises      General Comments        Pertinent Vitals/Pain Pain Assessment: No/denies pain    Home Living                      Prior Function            PT Goals (current goals can now be found in the care plan section)  Acute Rehab PT Goals Patient Stated Goal: to go home PT Goal Formulation: With patient Time For Goal Achievement: 02/20/17 Potential to Achieve Goals: Good Progress towards PT goals: Progressing toward goals    Frequency    Min 5X/week      PT Plan      Co-evaluation              AM-PAC PT "6 Clicks" Daily Activity  Outcome Measure  Difficulty turning over in bed (including adjusting bedclothes, sheets and blankets)?: None Difficulty moving from lying on back to sitting on the side of the bed? : None Difficulty sitting down on and standing up from a chair with arms (e.g., wheelchair, bedside commode, etc,.)?: None Help needed moving to and from a bed to chair (including a wheelchair)?: None Help needed walking in hospital room?: None Help needed climbing 3-5 steps with a railing? : None 6 Click Score: 24    End of Session Equipment Utilized During Treatment: Back brace Activity Tolerance: Patient tolerated treatment well Patient left: in bed;with call bell/phone  within reach;with SCD's reapplied(sitting EOB with call bell) Nurse Communication: Mobility status PT Visit Diagnosis: Unsteadiness on feet (R26.81);Difficulty in walking, not elsewhere classified (R26.2)     Time: 2956-21300810-0828 PT Time Calculation (min) (ACUTE ONLY): 18 min  Charges:  $Gait Training: 8-22 mins                    G Codes:       Charlotte Crumbevon Nicholle Falzon, PT DPT  Board Certified Neurologic Specialist [phone]    Fabio AsaDevon J Kastiel Simonian 02/07/2017, 8:35 AM

## 2017-02-07 NOTE — Progress Notes (Signed)
Patient is discharged from room 3C04 at this time. Alert and in stable condition. IV site d/c'd and instructions read to patient with understanding verbalized. Left unit via wheelchair with all belongings at side. 

## 2017-02-08 MED ORDER — SCOPOLAMINE 1 MG/3DAYS TD PT72
MEDICATED_PATCH | TRANSDERMAL | Status: DC | PRN
  Administered 2017-02-06: 1 via TRANSDERMAL

## 2017-02-08 NOTE — Addendum Note (Signed)
Addendum  created 02/08/17 1215 by Lovie Cholock, Willim Turnage K, CRNA   Intraprocedure Meds edited

## 2018-04-23 ENCOUNTER — Emergency Department (HOSPITAL_COMMUNITY): Payer: Medicare HMO

## 2018-04-23 ENCOUNTER — Encounter (HOSPITAL_COMMUNITY): Payer: Self-pay

## 2018-04-23 ENCOUNTER — Emergency Department (HOSPITAL_COMMUNITY)
Admission: EM | Admit: 2018-04-23 | Discharge: 2018-04-24 | Disposition: A | Discharge status: Home / Self Care | Payer: Medicare HMO | Attending: Emergency Medicine | Admitting: Emergency Medicine

## 2018-04-23 DIAGNOSIS — R079 Chest pain, unspecified: Secondary | ICD-10-CM | POA: Insufficient documentation

## 2018-04-23 DIAGNOSIS — Z5321 Procedure and treatment not carried out due to patient leaving prior to being seen by health care provider: Secondary | ICD-10-CM | POA: Diagnosis not present

## 2018-04-23 LAB — CBC
HCT: 43.4 % (ref 36.0–46.0)
Hemoglobin: 14 g/dL (ref 12.0–15.0)
MCH: 28.9 pg (ref 26.0–34.0)
MCHC: 32.3 g/dL (ref 30.0–36.0)
MCV: 89.5 fL (ref 80.0–100.0)
Platelets: 176 10*3/uL (ref 150–400)
RBC: 4.85 MIL/uL (ref 3.87–5.11)
RDW: 13.1 % (ref 11.5–15.5)
WBC: 8.5 10*3/uL (ref 4.0–10.5)
nRBC: 0 % (ref 0.0–0.2)

## 2018-04-23 LAB — BASIC METABOLIC PANEL
Anion gap: 11 (ref 5–15)
BUN: 11 mg/dL (ref 6–20)
CO2: 22 mmol/L (ref 22–32)
Calcium: 8.9 mg/dL (ref 8.9–10.3)
Chloride: 108 mmol/L (ref 98–111)
Creatinine, Ser: 0.86 mg/dL (ref 0.44–1.00)
GFR calc Af Amer: >60 mL/min (ref >60)
GFR calc non Af Amer: >60 mL/min (ref >60)
Glucose, Bld: 95 mg/dL (ref 70–99)
Potassium: 3.7 mmol/L (ref 3.5–5.1)
Sodium: 141 mmol/L (ref 135–145)

## 2018-04-23 LAB — I-STAT TROPONIN, ED: Troponin i, poc: 0 ng/mL (ref 0.00–0.08)

## 2018-04-23 MED ORDER — SODIUM CHLORIDE 0.9% FLUSH: 3.0000 mL | Freq: Once | INTRAVENOUS | Status: DC

## 2018-04-23 NOTE — ED Triage Notes (Signed)
Pt complains of pain that began in the left shoulder black that "went to my neck, head, back down to my neck and into my chest and left arm" Also endorses nausea. Has hx of "small blockage but I don't know where it is" VSS.

## 2018-04-23 NOTE — ED Notes (Signed)
Called pt x2 for vitals, no response. °

## 2018-11-05 ENCOUNTER — Encounter: Payer: Self-pay | Admitting: Gastroenterology

## 2018-12-03 ENCOUNTER — Encounter: Payer: Self-pay | Admitting: Gastroenterology

## 2018-12-13 ENCOUNTER — Other Ambulatory Visit: Payer: Self-pay

## 2018-12-13 ENCOUNTER — Ambulatory Visit: Payer: Medicare HMO | Admitting: Cardiology

## 2018-12-13 ENCOUNTER — Encounter: Payer: Self-pay | Admitting: Cardiology

## 2018-12-13 VITALS — BP 141/84 | HR 72 | Temp 97.7°F | Ht 63.0 in | Wt 140.4 lb

## 2018-12-13 DIAGNOSIS — I208 Other forms of angina pectoris: Secondary | ICD-10-CM | POA: Diagnosis not present

## 2018-12-13 DIAGNOSIS — F17200 Nicotine dependence, unspecified, uncomplicated: Secondary | ICD-10-CM

## 2018-12-13 DIAGNOSIS — I10 Essential (primary) hypertension: Secondary | ICD-10-CM | POA: Diagnosis not present

## 2018-12-13 DIAGNOSIS — I4729 Other ventricular tachycardia: Secondary | ICD-10-CM

## 2018-12-13 DIAGNOSIS — I472 Ventricular tachycardia: Secondary | ICD-10-CM

## 2018-12-13 MED ORDER — VARENICLINE TARTRATE 0.5 MG PO TABS: 0.5000 mg | ORAL_TABLET | Freq: Two times a day (BID) | ORAL | 1 refills | Status: DC

## 2018-12-13 MED ORDER — ROSUVASTATIN CALCIUM 10 MG PO TABS: 10.0000 mg | ORAL_TABLET | Freq: Every day | ORAL | 1 refills | Status: DC

## 2018-12-13 MED ORDER — NITROGLYCERIN 0.4 MG SL SUBL: 0.4000 mg | SUBLINGUAL_TABLET | SUBLINGUAL | 3 refills | Status: DC | PRN

## 2018-12-13 NOTE — Progress Notes (Signed)
Primary Physician:  Elisabeth MostGibson, Heather C, FNP   Patient ID: Regina SanesLisa J Appenzeller, female    DOB: 03-28-1961, 58 y.o.   MRN: [id number]  Subjective:    Chief Complaint  Patient presents with  . Chest Pain    pt c/o chest pain and tingling in jaw  . Follow-up    HPI: Regina Perry  is a 58 y.o. female  with hypertension, hyperlipidemia, tobacco use disorder, migraines, nonsustained ventricular tachycardia by event monitor in 2017 that was asymptomatic, last seen by us in August 2018 for palpitations. She is now here for follow up on chest pain.   Reports an episode a few days ago that awoke her from her sleep with left sided chest pain that radiated to her left shoulder. Episode lasted for 10-15 minutes and afterwards noted some tingling/numbness in her jaw. Symptoms lasted 30 minutes before resolving. States that she has been told to have a "blockage in her heart" by previous CT scan. She is not on statin as she states her lipids were well controlled; therefore, was discontinued by her PCP.  She continues to have palpitations similar to before that are stable. She is active, states that she walks her dogs several times a day around her [place] and tolerates this well. Has cramping in her legs at night, but no symptoms of claudication.   She does continue to smoke 15 cigarettes per day. Previously used chantix, but states it was the wrong time to try to quit as her mother and sister had died recently at that time.   Past Medical History:  Diagnosis Date  . Anxiety   . Bronchitis   . Depression   . Diverticulitis   . GERD (gastroesophageal reflux disease)   . H/O cesarean section   . History of hiatal hernia   . History of kidney stones    LEFT KIDNEY 2 STONES JUST WATCHING( [facility])  . Hypercholesteremia   . Hyperplastic colon polyp    12/30/2008  . Hypertension   . Migraine   . 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 Yates DecampJay Ganji), prescribed verapamil  . Pneumonia   . PONV (postoperative nausea and vomiting)     Past Surgical History:  Procedure Laterality Date  . ABDOMINAL HYSTERECTOMY     partial  . CESAREAN SECTION    . COLONOSCOPY    . ETHMOIDECTOMY Bilateral 06/26/2014   Procedure: BILATERAL ANTERIOR ETHMOIDECTOMY;  Surgeon: Drema Halonhristopher E Newman, MD;  Location: [facility];  Service: ENT;  Laterality: Bilateral;  . MAXILLARY ANTROSTOMY Bilateral 06/26/2014   Procedure: BILATERAL MAXILLARY OSTEA ENLARGEMENT;  Surgeon: Drema Halonhristopher E Newman, MD;  Location: [facility];  Service: ENT;  Laterality: Bilateral;  . NASAL SEPTOPLASTY W/ TURBINOPLASTY Bilateral 06/26/2014   Procedure: BILATERAL NASAL SEPTOPLASTY WITH TURBINATE REDUCTION;  Surgeon: Drema Halonhristopher E Newman, MD;  Location: [facility];  Service: ENT;  Laterality: Bilateral;  . SHOULDER ARTHROSCOPY W/ ROTATOR CUFF REPAIR  6/15   right  . TONSILLECTOMY AND ADENOIDECTOMY    . TUBAL LIGATION      Social History   Socioeconomic History  . Marital status: Married    Spouse name: Not on file  . Number of children: 2  . Years of education: 112  . Highest education level: Not on file  Occupational History  . Occupation: unemployed  Social Needs  . Financial resource strain:  Not on file  . Food insecurity    Worry: Not on file    Inability: Not on file  . Transportation needs    Medical: Not on file    Non-medical: Not on file  Tobacco Use  . Smoking status: Current Every Day Smoker    Packs/day: 1.00    Years: 35.00    Pack years: 35.00    Types: Cigarettes  . Smokeless tobacco: Never Used  Substance and Sexual Activity  . Alcohol use: No  . Drug use: No  . Sexual activity: Not on file  Lifestyle  . Physical activity    Days per week: Not on file    Minutes per session: Not on file  . Stress: Not on file  Relationships  . Social Musicianconnections    Talks on phone: Not on  file    Gets together: Not on file    Attends religious service: Not on file    Active member of club or organization: Not on file    Attends meetings of clubs or organizations: Not on file    Relationship status: Not on file  . Intimate partner violence    Fear of current or ex partner: Not on file    Emotionally abused: Not on file    Physically abused: Not on file    Forced sexual activity: Not on file  Other Topics Concern  . Not on file  Social History Narrative   Patient drinks about 3-4 cups of caffeine daily.    Patient is right handed.     Review of Systems  Constitution: Negative for decreased appetite, malaise/fatigue, weight gain and weight loss.  Eyes: Negative for visual disturbance.  Cardiovascular: Positive for palpitations. Negative for chest pain, claudication, dyspnea on exertion, leg swelling, orthopnea and syncope.  Respiratory: Negative for hemoptysis and wheezing.   Endocrine: Negative for cold intolerance and heat intolerance.  Hematologic/Lymphatic: Does not bruise/bleed easily.  Skin: Negative for nail changes.  Musculoskeletal: Positive for back pain. Negative for muscle weakness and myalgias.  Gastrointestinal: Negative for abdominal pain, change in bowel habit, nausea and vomiting.  Neurological: Negative for difficulty with concentration, dizziness, focal weakness and headaches.  Psychiatric/Behavioral: Negative for altered mental status and suicidal ideas.  All other systems reviewed and are negative.     Objective:  Blood pressure (!) 141/84, pulse 72, temperature 97.7 F (36.5 C), height 5\' 3"  (1.6 m), weight 140 lb 6.4 oz (63.7 kg), SpO2 96 %. Body mass index is 24.87 kg/m.    Physical Exam  Constitutional: She is oriented to person, place, and time. Vital signs are normal. She appears well-developed and well-nourished.  HENT:  Head: Normocephalic and atraumatic.  Neck: Normal range of motion.  Cardiovascular: Normal rate, regular rhythm,  normal heart sounds and intact distal pulses.  Pulmonary/Chest: Effort normal and breath sounds normal. No accessory muscle usage. No respiratory distress.  Abdominal: Soft. Bowel sounds are normal.  Musculoskeletal: Normal range of motion.  Neurological: She is alert and oriented to person, place, and time.  Skin: Skin is warm and dry.  Vitals reviewed.  Radiology: No results found.  Laboratory examination:    CMP Latest Ref Rng & Units 04/23/2018 02/02/2017 12/10/2015  Glucose 70 - 99 mg/dL 95 89 161(W112(H)  BUN 6 - 20 mg/dL 11 6 13   Creatinine 0.44 - 1.00 mg/dL 9.600.86 4.540.90 0.98(J1.05(H)  Sodium 135 - 145 mmol/L 141 140 141  Potassium 3.5 - 5.1 mmol/L 3.7 4.4 3.9  Chloride 98 -  111 mmol/L 108 109 107  CO2 22 - 32 mmol/L 22 25 26   Calcium 8.9 - 10.3 mg/dL 8.9 9.1 10.1  Total Protein 6.0 - 8.3 g/dL - - -  Total Bilirubin 0.3 - 1.2 mg/dL - - -  Alkaline Phos 39 - 117 U/L - - -  AST 0 - 37 U/L - - -  ALT 0 - 35 U/L - - -   CBC Latest Ref Rng & Units 04/23/2018 02/02/2017 12/10/2015  WBC 4.0 - 10.5 K/uL 8.5 9.3 11.8(H)  Hemoglobin 12.0 - 15.0 g/dL 14.0 14.2 13.5  Hematocrit 36.0 - 46.0 % 43.4 42.9 42.1  Platelets 150 - 400 K/uL 176 211 240   Lipid Panel     Component Value Date/Time   CHOL 197 04/10/2012 0414   TRIG 251 (H) 04/10/2012 0414   HDL 42 04/10/2012 0414   CHOLHDL 4.7 04/10/2012 0414   VLDL 50 (H) 04/10/2012 0414   LDLCALC 105 (H) 04/10/2012 0414   HEMOGLOBIN A1C Lab Results  Component Value Date   HGBA1C 5.6 04/10/2012   MPG 114 04/10/2012   TSH No results for input(s): TSH in the last 8760 hours.  PRN Meds:. Medications Discontinued During This Encounter  Medication Reason  . buPROPion (WELLBUTRIN SR) 150 MG 12 hr tablet Error  . citalopram (CELEXA) 10 MG tablet Error  . clonazePAM (KLONOPIN) 0.5 MG tablet Error  . cyclobenzaprine (FLEXERIL) 10 MG tablet Error  . diazepam (VALIUM) 5 MG tablet Error  . lovastatin (MEVACOR) 20 MG tablet Error  . nicotine (NICODERM  CQ - DOSED IN MG/24 HOURS) 14 mg/24hr patch Error  . oxyCODONE 10 MG TABS Error   Current Meds  Medication Sig  . ALPRAZolam (XANAX) 0.5 MG tablet Take 0.5 mg by mouth at bedtime as needed for anxiety.  . cetirizine (ZYRTEC) 10 MG tablet Take 10 mg by mouth daily.  . Cholecalciferol (D3-1000) 25 MCG (1000 UT) capsule Take 1,000 Units by mouth daily.  Marland Kitchen gabapentin (NEURONTIN) 300 MG capsule Take 1 capsule (300 mg total) by mouth 3 (three) times daily.  Marland Kitchen HYDROcodone-acetaminophen (NORCO/VICODIN) 5-325 MG tablet Take 1 tablet by mouth 4 (four) times daily as needed for moderate pain.  . meclizine (ANTIVERT) 12.5 MG tablet Take 12.5 mg by mouth 3 (three) times daily as needed for dizziness.  Marland Kitchen omeprazole (PRILOSEC) 40 MG capsule Take 1 capsule (40 mg total) by mouth daily.  . predniSONE (STERAPRED UNI-PAK 21 TAB) 10 MG (21) TBPK tablet Take by mouth daily.  . promethazine (PHENERGAN) 25 MG tablet Take 25 mg every 6 (six) hours as needed by mouth for nausea or vomiting.  . SUMAtriptan (IMITREX) 100 MG tablet Take 100 mg every 2 (two) hours as needed by mouth for migraine.   . verapamil (CALAN-SR) 180 MG CR tablet Take 180 mg daily by mouth.     Cardiac Studies:   Nuclear stress test [07/20/2015]: 1. The resting electrocardiogram demonstrated normal sinus rhythm, normal resting conduction, no resting arrhythmias and normal rest repolarization. The stress electrocardiogram was normal. Patient exercised on Bruce protocol for 8:35 minutes and achieved 10.16 METS. Stress test terminated due to target heart rate (86% MPHR). Stress symptoms included dyspnea, dizziness. The stress test was terminated because of fatigue. 2. Myocardial perfusion imaging is normal. Overall left ventricular systolic function was normal without regional wall motion abnormalities. The left ventricular ejection fraction was 54%.   Echocardiogram 07/21/2015: Left ventricle cavity is normal in size. Mild concentric hypertrophy  of the left  ventricle. Normal global wall motion. Normal diastolic filling pattern. Calculated EF 56%. Left atrial cavity is normal in size. Trace mitral regurgitation. Trace pulmonic regurgitation.  Assessment:   Angina at rest [facility]) - Plan: EKG 12-Lead, CT CORONARY MORPH W/CTA COR W/SCORE W/CA W/CM &/OR WO/CM, CT CORONARY FRACTIONAL FLOW RESERVE FLUID ANALYSIS  Essential hypertension  NSVT (nonsustained ventricular tachycardia) (HCC)  Tobacco use disorder  EKG 12/13/2018: Normal sinus rhythm at 61 bpm, normal axis, no evidence of ischemia.  Recommendations:   Patient presents for follow-up for chest pain.  She recently had an episode of chest discomfort that awoke her from sleep radiating to her left shoulder and up to her jaw with tingling and numbness sensation.  Episode lasted approximately 30 minutes.  She has not had recurrence since.  She is fairly active with walking her dogs and maintaining her [place] acres, no exertional difficulty.  She has been see had negative nuclear stress test in 2017.  Her symptoms are concerning, discussed further evaluation options including repeat stress testing versus coronary CTA.  She wishes to proceed with coronary CTA for further evaluation.  I will give her a prescription for nitroglycerin in case she has recurrence of chest discomfort.  Advised on how to use.  EKG is without acute abnormalities.  She reports she was previously on lovastatin that she tolerated well and as her lipids were well controlled, this was discontinued.  On previous CT of the abdomen, had aortic atherosclerosis and given her concerning symptoms, I will restart her on Crestor 10 mg daily.  Blood pressure was slightly elevated today, will reevaluate at her next office visit.   I have stressed the importance of smoking cessation, she is willing to quit. Discussed options including medication vs. Nicotine patches. She wishes to try Chantix again. Will send in prescription for this. I  will see her back after her CTA for follow up.   Toniann Fail, MSN, APRN, FNP-C [facility] [place] Office: [phone] Fax: [phone]

## 2018-12-21 ENCOUNTER — Telehealth (HOSPITAL_COMMUNITY): Payer: Self-pay | Admitting: Emergency Medicine

## 2018-12-21 NOTE — Telephone Encounter (Signed)
Left message on voicemail with name and callback number Laneya Gasaway RN Navigator Cardiac Imaging [facility] [phone] Office [phone] Cell  

## 2018-12-25 ENCOUNTER — Other Ambulatory Visit: Payer: Self-pay

## 2018-12-25 ENCOUNTER — Ambulatory Visit (HOSPITAL_COMMUNITY)
Admission: RE | Admit: 2018-12-25 | Discharge: 2018-12-25 | Disposition: A | Discharge status: Home / Self Care | Payer: Medicare HMO | Source: Ambulatory Visit | Attending: Cardiology | Admitting: Cardiology

## 2018-12-25 DIAGNOSIS — I208 Other forms of angina pectoris: Secondary | ICD-10-CM

## 2018-12-25 MED ORDER — IOHEXOL 350 MG/ML SOLN
80.0000 mL | Freq: Once | INTRAVENOUS | Status: AC | PRN
  Administered 2018-12-25: 80 mL via INTRAVENOUS

## 2018-12-25 MED ORDER — NITROGLYCERIN 0.4 MG SL SUBL
SUBLINGUAL_TABLET | SUBLINGUAL | Status: AC
  Filled 2018-12-25: qty 2

## 2018-12-25 MED ORDER — NITROGLYCERIN 0.4 MG SL SUBL
0.8000 mg | SUBLINGUAL_TABLET | Freq: Once | SUBLINGUAL | Status: AC
  Administered 2018-12-25: 0.8 mg via SUBLINGUAL
  Filled 2018-12-25: qty 25

## 2018-12-26 DIAGNOSIS — I2510 Atherosclerotic heart disease of native coronary artery without angina pectoris: Secondary | ICD-10-CM

## 2018-12-26 NOTE — Progress Notes (Signed)
Abnormal Coronary CTA, will likely need further evaluation. Will discuss at upcoming appt.

## 2019-01-01 ENCOUNTER — Ambulatory Visit: Payer: Medicare HMO | Admitting: Cardiology

## 2019-01-01 NOTE — Progress Notes (Deleted)
Primary Physician:  Elisabeth MostGibson, Heather C, FNP   Patient ID: Regina Perry, female    DOB: [birth year], 58 y.o.   MRN: [id number]  Subjective:    No chief complaint on file.   HPI: Regina Perry  is a 58 y.o. female  with hypertension, hyperlipidemia, tobacco use disorder, migraines, nonsustained ventricular tachycardia by event monitor in 2017 that was asymptomatic, last seen by us in August 2018 for palpitations. She is now here for follow up on chest pain.   Reports an episode a few days ago that awoke her from her sleep with left sided chest pain that radiated to her left shoulder. Episode lasted for 10-15 minutes and afterwards noted some tingling/numbness in her jaw. Symptoms lasted 30 minutes before resolving. States that she has been told to have a "blockage in her heart" by previous CT scan. She is not on statin as she states her lipids were well controlled; therefore, was discontinued by her PCP.  She continues to have palpitations similar to before that are stable. She is active, states that she walks her dogs several times a day around her [place] and tolerates this well. Has cramping in her legs at night, but no symptoms of claudication.   She does continue to smoke 15 cigarettes per day. Previously used chantix, but states it was the wrong time to try to quit as her mother and sister had died recently at that time.   Past Medical History:  Diagnosis Date  . Anxiety   . Bronchitis   . Depression   . Diverticulitis   . GERD (gastroesophageal reflux disease)   . H/O cesarean section   . History of hiatal hernia   . History of kidney stones    LEFT KIDNEY 2 STONES JUST WATCHING( [facility])  . Hypercholesteremia   . Hyperplastic colon polyp    12/30/2008  . Hypertension   . Migraine   . 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 Yates DecampJay Ganji), prescribed verapamil  . Pneumonia   . PONV  (postoperative nausea and vomiting)     Past Surgical History:  Procedure Laterality Date  . ABDOMINAL HYSTERECTOMY     partial  . CESAREAN SECTION    . COLONOSCOPY    . ETHMOIDECTOMY Bilateral 06/26/2014   Procedure: BILATERAL ANTERIOR ETHMOIDECTOMY;  Surgeon: Drema Halonhristopher E Newman, MD;  Location: [facility];  Service: ENT;  Laterality: Bilateral;  . MAXILLARY ANTROSTOMY Bilateral 06/26/2014   Procedure: BILATERAL MAXILLARY OSTEA ENLARGEMENT;  Surgeon: Drema Halonhristopher E Newman, MD;  Location: [facility];  Service: ENT;  Laterality: Bilateral;  . NASAL SEPTOPLASTY W/ TURBINOPLASTY Bilateral 06/26/2014   Procedure: BILATERAL NASAL SEPTOPLASTY WITH TURBINATE REDUCTION;  Surgeon: Drema Halonhristopher E Newman, MD;  Location: [facility];  Service: ENT;  Laterality: Bilateral;  . SHOULDER ARTHROSCOPY W/ ROTATOR CUFF REPAIR  6/15   right  . TONSILLECTOMY AND ADENOIDECTOMY    . TUBAL LIGATION      Social History   Socioeconomic History  . Marital status: Married    Spouse name: Not on file  . Number of children: 2  . Years of education: 3812  . Highest education level: Not on file  Occupational History  . Occupation: unemployed  Social Needs  . Financial resource strain: Not on file  . Food insecurity    Worry: Not on file    Inability: Not on  file  . Transportation needs    Medical: Not on file    Non-medical: Not on file  Tobacco Use  . Smoking status: Current Every Day Smoker    Packs/day: 1.00    Years: 35.00    Pack years: 35.00    Types: Cigarettes  . Smokeless tobacco: Never Used  Substance and Sexual Activity  . Alcohol use: No  . Drug use: No  . Sexual activity: Not on file  Lifestyle  . Physical activity    Days per week: Not on file    Minutes per session: Not on file  . Stress: Not on file  Relationships  . Social Musician on phone: Not on file    Gets together: Not on file    Attends religious service: Not on file     Active member of club or organization: Not on file    Attends meetings of clubs or organizations: Not on file    Relationship status: Not on file  . Intimate partner violence    Fear of current or ex partner: Not on file    Emotionally abused: Not on file    Physically abused: Not on file    Forced sexual activity: Not on file  Other Topics Concern  . Not on file  Social History Narrative   Patient drinks about 3-4 cups of caffeine daily.    Patient is right handed.     Review of Systems  Constitution: Negative for decreased appetite, malaise/fatigue, weight gain and weight loss.  Eyes: Negative for visual disturbance.  Cardiovascular: Positive for palpitations. Negative for chest pain, claudication, dyspnea on exertion, leg swelling, orthopnea and syncope.  Respiratory: Negative for hemoptysis and wheezing.   Endocrine: Negative for cold intolerance and heat intolerance.  Hematologic/Lymphatic: Does not bruise/bleed easily.  Skin: Negative for nail changes.  Musculoskeletal: Positive for back pain. Negative for muscle weakness and myalgias.  Gastrointestinal: Negative for abdominal pain, change in bowel habit, nausea and vomiting.  Neurological: Negative for difficulty with concentration, dizziness, focal weakness and headaches.  Psychiatric/Behavioral: Negative for altered mental status and suicidal ideas.  All other systems reviewed and are negative.     Objective:  There were no vitals taken for this visit. There is no height or weight on file to calculate BMI.    Physical Exam  Constitutional: She is oriented to person, place, and time. Vital signs are normal. She appears well-developed and well-nourished.  HENT:  Head: Normocephalic and atraumatic.  Neck: Normal range of motion.  Cardiovascular: Normal rate, regular rhythm, normal heart sounds and intact distal pulses.  Pulmonary/Chest: Effort normal and breath sounds normal. No accessory muscle usage. No respiratory  distress.  Abdominal: Soft. Bowel sounds are normal.  Musculoskeletal: Normal range of motion.  Neurological: She is alert and oriented to person, place, and time.  Skin: Skin is warm and dry.  Vitals reviewed.  Radiology:  Coronary CTA 12/25/18:  1. The left main is short and FFRct cannot be modeled in this region.  2. FFRct in the proximal LAD is mildly abnormal. However modeling demonstrates that opening the left main stenosis results in a significant improvement in flow in the proximal LAD.  3. FFRct demonstrates significant stenoses in the proximal and mid LAD. Based on modeling, opening the LM and proximal stenosis does not normalize flow more distally.  4. Recommend cardiac catheterization. Take note of concern for ostial left main disease.  Laboratory examination:    CMP Latest Ref  Rng & Units 04/23/2018 02/02/2017 12/10/2015  Glucose 70 - 99 mg/dL 95 89 742(V)  BUN 6 - 20 mg/dL 11 6 13   Creatinine 0.44 - 1.00 mg/dL 9.56 3.87)  Sodium 135 - 145 mmol/L 141 140 141  Potassium 3.5 - 5.1 mmol/L 3.7 4.4 3.9  Chloride 98 - 111 mmol/L 108 109 107  CO2 22 - 32 mmol/L 22 25 26   Calcium 8.9 - 10.3 mg/dL 8.9 9.1 5.64(P  Total Protein 6.0 - 8.3 g/dL - - -  Total Bilirubin 0.3 - 1.2 mg/dL - - -  Alkaline Phos 39 - 117 U/L - - -  AST 0 - 37 U/L - - -  ALT 0 - 35 U/L - - -   CBC Latest Ref Rng & Units 04/23/2018 02/02/2017 12/10/2015  WBC 4.0 - 10.5 K/uL 8.5 9.3 11.8(H)  Hemoglobin 12.0 - 15.0 g/dL 13/10/2016 12/12/2015 51.8  Hematocrit 36.0 - 46.0 % 43.4 42.9 42.1  Platelets 150 - 400 K/uL 176 211 240   Lipid Panel     Component Value Date/Time   CHOL 197 04/10/2012 0414   TRIG 251 (H) 04/10/2012 0414   HDL 42 04/10/2012 0414   CHOLHDL 4.7 04/10/2012 0414   VLDL 50 (H) 04/10/2012 0414   LDLCALC 105 (H) 04/10/2012 0414   HEMOGLOBIN A1C Lab Results  Component Value Date   HGBA1C 5.6 04/10/2012   MPG 114 04/10/2012   TSH No results for input(s): TSH in the last 8760  hours.  PRN Meds:. There are no discontinued medications. No outpatient medications have been marked as taking for the 01/01/19 encounter (Appointment) with 04/12/2012, NP.    Cardiac Studies:   Nuclear stress test [07/20/2015]: 1. The resting electrocardiogram demonstrated normal sinus rhythm, normal resting conduction, no resting arrhythmias and normal rest repolarization. The stress electrocardiogram was normal. Patient exercised on Bruce protocol for 8:35 minutes and achieved 10.16 METS. Stress test terminated due to target heart rate (86% MPHR). Stress symptoms included dyspnea, dizziness. The stress test was terminated because of fatigue. 2. Myocardial perfusion imaging is normal. Overall left ventricular systolic function was normal without regional wall motion abnormalities. The left ventricular ejection fraction was 54%.   Echocardiogram 07/21/2015: Left ventricle cavity is normal in size. Mild concentric hypertrophy of the left ventricle. Normal global wall motion. Normal diastolic filling pattern. Calculated EF 56%. Left atrial cavity is normal in size. Trace mitral regurgitation. Trace pulmonic regurgitation.  Assessment:   No diagnosis found.  EKG 12/13/2018: Normal sinus rhythm at 61 bpm, normal axis, no evidence of ischemia.  Recommendations:   Patient presents for follow-up for chest pain.  She recently had an episode of chest discomfort that awoke her from sleep radiating to her left shoulder and up to her jaw with tingling and numbness sensation.  Episode lasted approximately 30 minutes.  She has not had recurrence since.  She is fairly active with walking her dogs and maintaining her 7 acres, no exertional difficulty.  She has been see had negative nuclear stress test in 2017.  Her symptoms are concerning, discussed further evaluation options including repeat stress testing versus coronary CTA.  She wishes to proceed with coronary CTA for further evaluation.   I will give her a prescription for nitroglycerin in case she has recurrence of chest discomfort.  Advised on how to use.  EKG is without acute abnormalities.  She reports she was previously on lovastatin that she tolerated well and as her lipids were well controlled, this  was discontinued.  On previous CT of the abdomen, had aortic atherosclerosis and given her concerning symptoms, I will restart her on Crestor 10 mg daily.  Blood pressure was slightly elevated today, will reevaluate at her next office visit.   I have stressed the importance of smoking cessation, she is willing to quit. Discussed options including medication vs. Nicotine patches. She wishes to try Chantix again. Will send in prescription for this. I will see her back after her CTA for follow up.   Miquel Dunn, MSN, APRN, FNP-C [facility]. [place] Office: [phone] Fax: [phone]

## 2019-01-04 ENCOUNTER — Other Ambulatory Visit (HOSPITAL_COMMUNITY)
Admission: RE | Admit: 2019-01-04 | Discharge: 2019-01-04 | Disposition: A | Discharge status: Home / Self Care | Payer: Medicare HMO | Source: Ambulatory Visit | Attending: Cardiology | Admitting: Cardiology

## 2019-01-04 ENCOUNTER — Ambulatory Visit (INDEPENDENT_AMBULATORY_CARE_PROVIDER_SITE_OTHER): Payer: Medicare HMO | Admitting: Cardiology

## 2019-01-04 ENCOUNTER — Encounter: Payer: Self-pay | Admitting: Cardiology

## 2019-01-04 ENCOUNTER — Other Ambulatory Visit: Payer: Self-pay

## 2019-01-04 VITALS — BP 157/89 | HR 76 | Temp 97.7°F | Ht 63.0 in | Wt 141.0 lb

## 2019-01-04 DIAGNOSIS — Z01812 Encounter for preprocedural laboratory examination: Secondary | ICD-10-CM | POA: Diagnosis present

## 2019-01-04 DIAGNOSIS — I10 Essential (primary) hypertension: Secondary | ICD-10-CM

## 2019-01-04 DIAGNOSIS — F17200 Nicotine dependence, unspecified, uncomplicated: Secondary | ICD-10-CM

## 2019-01-04 DIAGNOSIS — I472 Ventricular tachycardia: Secondary | ICD-10-CM

## 2019-01-04 DIAGNOSIS — I208 Other forms of angina pectoris: Secondary | ICD-10-CM | POA: Diagnosis not present

## 2019-01-04 DIAGNOSIS — Z20828 Contact with and (suspected) exposure to other viral communicable diseases: Secondary | ICD-10-CM | POA: Diagnosis not present

## 2019-01-04 DIAGNOSIS — I4729 Other ventricular tachycardia: Secondary | ICD-10-CM

## 2019-01-04 MED ORDER — ROSUVASTATIN CALCIUM 40 MG PO TABS: 40.0000 mg | ORAL_TABLET | Freq: Every day | ORAL | 2 refills | Status: DC

## 2019-01-04 MED ORDER — METOPROLOL TARTRATE 25 MG PO TABS: 25.0000 mg | ORAL_TABLET | Freq: Two times a day (BID) | ORAL | 2 refills | Status: DC

## 2019-01-04 NOTE — Addendum Note (Signed)
Addended by: Gwinda Maine on: 01/04/2019 01:10 PM   Modules accepted: Orders

## 2019-01-04 NOTE — Progress Notes (Signed)
Primary Physician:  Regina Most, FNP   Patient ID: Regina Perry, female    DOB: 26-Jul-1960, 58 y.o.   MRN: [id number]  Subjective:    Chief Complaint  Patient presents with   Follow-up    Coronary CTA   Hypertension    HPI: Regina Perry  is a 58 y.o. female  with hypertension, hyperlipidemia, tobacco use disorder, migraines, nonsustained ventricular tachycardia by event monitor in 2017 that was asymptomatic, recently seen for chest pain.   Recently had an episode concerning for angina that awoke her from sleep. She underwent coronary CTA and now presents for follow up.  She has not had any similar episodes since last seen by [place]. She has had occasional episodes of mild jaw tingling sensation, not noted with exertion. Also reports one episode indigestion. She was started on Crestor at her last visit.   She continues to have palpitations similar to before that are stable. She is active, states that she walks her dogs several times a day around her [place] and tolerates this well. Has cramping in her legs at night, but no symptoms of claudication.   She does continue to smoke 15 cigarettes per day. Previously used chantix, but states it was the wrong time to try to quit as her mother and sister had died recently at that time.   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 Yates Decamp), prescribed verapamil   Pneumonia    PONV (postoperative nausea and vomiting)     Past Surgical History:  Procedure Laterality Date     ABDOMINAL HYSTERECTOMY     partial   CESAREAN SECTION     COLONOSCOPY     ETHMOIDECTOMY Bilateral 06/26/2014   Procedure: BILATERAL ANTERIOR ETHMOIDECTOMY;  Surgeon: Drema Halon, MD;  Location: [facility];  Service: ENT;  Laterality: Bilateral;   MAXILLARY ANTROSTOMY Bilateral 06/26/2014   Procedure: BILATERAL MAXILLARY OSTEA ENLARGEMENT;  Surgeon: Drema Halon, MD;  Location: [facility];  Service: ENT;  Laterality: Bilateral;   NASAL SEPTOPLASTY W/ TURBINOPLASTY Bilateral 06/26/2014   Procedure: BILATERAL NASAL SEPTOPLASTY WITH TURBINATE REDUCTION;  Surgeon: Drema Halon, MD;  Location: [facility];  Service: ENT;  Laterality: Bilateral;   SHOULDER ARTHROSCOPY W/ ROTATOR CUFF REPAIR  6/15   right   TONSILLECTOMY AND ADENOIDECTOMY     TUBAL LIGATION      Social History   Socioeconomic History   Marital status: Married    Spouse name: Not on file   Number of children: 2   Years of education: 12   Highest education level: Not on file  Occupational History   Occupation: unemployed  Social Network engineer strain: Not on file   Food insecurity    Worry: Not on file    Inability: Not on file   Transportation needs    Medical: Not on file  Non-medical: Not on file  Tobacco Use   Smoking status: Current Every Day Smoker    Packs/day: 0.50    Years: 35.00    Pack years: 17.50    Types: Cigarettes   Smokeless tobacco: Never Used  Substance and Sexual Activity   Alcohol use: No   Drug use: No   Sexual activity: Not on file  Lifestyle   Physical activity    Days per week: Not on file    Minutes per session: Not on file   Stress: Not on file  Relationships   Social connections    Talks on phone: Not on file    Gets together: Not on file    Attends religious service: Not on file    Active member of club or organization: Not on file    Attends meetings of clubs or  organizations: Not on file    Relationship status: Not on file   Intimate partner violence    Fear of current or ex partner: Not on file    Emotionally abused: Not on file    Physically abused: Not on file    Forced sexual activity: Not on file  Other Topics Concern   Not on file  Social History Narrative   Patient drinks about 3-4 cups of caffeine daily.    Patient is right handed.     Review of Systems  Constitution: Negative for decreased appetite, malaise/fatigue, weight gain and weight loss.  Eyes: Negative for visual disturbance.  Cardiovascular: Positive for palpitations. Negative for chest pain, claudication, dyspnea on exertion, leg swelling, orthopnea and syncope.  Respiratory: Negative for hemoptysis and wheezing.   Endocrine: Negative for cold intolerance and heat intolerance.  Hematologic/Lymphatic: Does not bruise/bleed easily.  Skin: Negative for nail changes.  Musculoskeletal: Positive for back pain. Negative for muscle weakness and myalgias.  Gastrointestinal: Negative for abdominal pain, change in bowel habit, nausea and vomiting.  Neurological: Negative for difficulty with concentration, dizziness, focal weakness and headaches.  Psychiatric/Behavioral: Negative for altered mental status and suicidal ideas.  All other systems reviewed and are negative.     Objective:  Blood pressure (!) 157/89, pulse 76, temperature 97.7 F (36.5 C), height 5\' 3"  (1.6 m), weight 141 lb (64 kg), SpO2 97 %. Body mass index is 24.98 kg/m.    Physical Exam  Constitutional: She is oriented to person, place, and time. Vital signs are normal. She appears well-developed and well-nourished.  HENT:  Head: Normocephalic and atraumatic.  Neck: Normal range of motion.  Cardiovascular: Normal rate, regular rhythm, normal heart sounds and intact distal pulses.  Pulmonary/Chest: Effort normal and breath sounds normal. No accessory muscle usage. No respiratory distress.  Abdominal: Soft.  Bowel sounds are normal.  Musculoskeletal: Normal range of motion.  Neurological: She is alert and oriented to person, place, and time.  Skin: Skin is warm and dry.  Vitals reviewed.  Radiology:  Coronary CTA 12/25/18:  1. The left main is short and FFRct cannot be modeled in this region.  2. FFRct in the proximal LAD is mildly abnormal. However modeling demonstrates that opening the left main stenosis results in a significant improvement in flow in the proximal LAD.  3. FFRct demonstrates significant stenoses in the proximal and mid LAD. Based on modeling, opening the LM and proximal stenosis does not normalize flow more distally.  4. Recommend cardiac catheterization. Take note of concern for ostial left main disease.  Laboratory examination:    CMP Latest Ref Rng & Units 04/23/2018  02/02/2017 12/10/2015  Glucose 70 - 99 mg/dL 95 89 161(W)  BUN 6 - 20 mg/dL Creatinine 0.44 - 1.00 mg/dL 9.60 4.54 0.98(J)  Sodium 135 - 145 mmol/L 141 140 141  Potassium 3.5 - 5.1 mmol/L 3.7 4.4 3.9  Chloride 98 - 111 mmol/L 108 109 107  CO2 22 - 32 mmol/L Calcium 8.9 - 10.3 mg/dL 8.9 9.1 19.1  Total Protein 6.0 - 8.3 g/dL - - -  Total Bilirubin 0.3 - 1.2 mg/dL - - -  Alkaline Phos 39 - 117 U/L - - -  AST 0 - 37 U/L - - -  ALT 0 - 35 U/L - - -   CBC Latest Ref Rng & Units 04/23/2018 02/02/2017 12/10/2015  WBC 4.0 - 10.5 K/uL 8.5 9.3 11.8(H)  Hemoglobin 12.0 - 15.0 g/dL 47.8 29.5 62.1  Hematocrit 36.0 - 46.0 % 43.4 42.9 42.1  Platelets 150 - 400 K/uL 176 211 240   Lipid Panel     Component Value Date/Time   CHOL 197 04/10/2012 0414   TRIG 251 (H) 04/10/2012 0414   HDL 42 04/10/2012 0414   CHOLHDL 4.7 04/10/2012 0414   VLDL 50 (H) 04/10/2012 0414   LDLCALC 105 (H) 04/10/2012 0414   HEMOGLOBIN A1C Lab Results  Component Value Date   HGBA1C 5.6 04/10/2012   MPG 114 04/10/2012   TSH No results for input(s): TSH in the last 8760 hours.  PRN Meds:. Medications  Discontinued During This Encounter  Medication Reason   meclizine (ANTIVERT) 12.5 MG tablet Error   predniSONE (STERAPRED UNI-PAK 21 TAB) 10 MG (21) TBPK tablet Error   varenicline (CHANTIX) 0.5 MG tablet Error   Current Meds  Medication Sig   ALPRAZolam (XANAX) 0.5 MG tablet Take 0.5 mg by mouth at bedtime as needed for anxiety.   cetirizine (ZYRTEC) 10 MG tablet Take 10 mg by mouth daily.   Cholecalciferol (D3-1000) 25 MCG (1000 UT) capsule Take 1,000 Units by mouth daily.   gabapentin (NEURONTIN) 300 MG capsule Take 1 capsule (300 mg total) by mouth 3 (three) times daily.   HYDROcodone-acetaminophen (NORCO/VICODIN) 5-325 MG tablet Take 1 tablet by mouth 4 (four) times daily as needed for moderate pain.   nitroGLYCERIN (NITROSTAT) 0.4 MG SL tablet Place 1 tablet (0.4 mg total) under the tongue every 5 (five) minutes as needed for chest pain.   omeprazole (PRILOSEC) 40 MG capsule Take 1 capsule (40 mg total) by mouth daily.   promethazine (PHENERGAN) 25 MG tablet Take 25 mg every 6 (six) hours as needed by mouth for nausea or vomiting.   rosuvastatin (CRESTOR) 10 MG tablet Take 10 mg by mouth daily.   SUMAtriptan (IMITREX) 100 MG tablet Take 100 mg every 2 (two) hours as needed by mouth for migraine.    verapamil (CALAN-SR) 180 MG CR tablet Take 180 mg daily by mouth.    Vitamin D, Ergocalciferol, (DRISDOL) 1.25 MG (50000 UT) CAPS capsule Take 50,000 Units by mouth every 7 (seven) days.    Cardiac Studies:   Nuclear stress test [07/20/2015]: 1. The resting electrocardiogram demonstrated normal sinus rhythm, normal resting conduction, no resting arrhythmias and normal rest repolarization. The stress electrocardiogram was normal. Patient exercised on Bruce protocol for 8:35 minutes and achieved 10.16 METS. Stress test terminated due to target heart rate (86% MPHR). Stress symptoms included dyspnea, dizziness. The stress test was terminated because of fatigue. 2. Myocardial  perfusion imaging is normal. Overall left ventricular systolic  function was normal without regional wall motion abnormalities. The left ventricular ejection fraction was 54%.   Echocardiogram 07/21/2015: Left ventricle cavity is normal in size. Mild concentric hypertrophy of the left ventricle. Normal global wall motion. Normal diastolic filling pattern. Calculated EF 56%. Left atrial cavity is normal in size. Trace mitral regurgitation. Trace pulmonic regurgitation.  Assessment:   Angina at rest [facility](HCC)  Essential hypertension  NSVT (nonsustained ventricular tachycardia) (HCC)  Tobacco use disorder  EKG 12/13/2018: Normal sinus rhythm at 61 bpm, normal axis, no evidence of ischemia.  Recommendations:   I discussed coronary CTA results with the patient, in view of abnormal findings and concern of left main disease, I have recommended coronary angiogram for further evaluation. Schedule for cardiac catheterization, and possible angioplasty. We discussed regarding risks, benefits, alternatives to this including stress testing, CTA and continued medical therapy. Patient wants to proceed. Understands <1-2% risk of death, stroke, MI, urgent CABG, bleeding, infection, renal failure but not limited to these.  She has not had any recent episodes of angina since last evaluation except for one episode of jaw tingling sensation.  She has nitroglycerin for as needed use.  I will start her on metoprolol tartrate for antianginal therapy.  We will change Crestor to 40 mg for efficacy.  She has had history of NSVT on previous montior. Does have occasional palpitations.   She has been able to cut back to 7 cigarettes/day, strongly encouraged her to continue to work on complete smoking cessation.  Congratulated her on her recent efforts.  We will see her back after the procedure for follow-up.   Toniann FailAshton Haynes Valera Vallas, MSN, APRN, FNP-C [facility] Office: [phone] Fax: [phone]

## 2019-01-04 NOTE — H&P (View-Only) (Signed)
Primary Physician:  Elisabeth Most, FNP   Patient ID: Regina Perry, female    DOB: 26-Jul-1960, 58 y.o.   MRN: [id number]  Subjective:    Chief Complaint  Patient presents with   Follow-up    Coronary CTA   Hypertension    HPI: Regina Perry  is a 58 y.o. female  with hypertension, hyperlipidemia, tobacco use disorder, migraines, nonsustained ventricular tachycardia by event monitor in 2017 that was asymptomatic, recently seen for chest pain.   Recently had an episode concerning for angina that awoke her from sleep. She underwent coronary CTA and now presents for follow up.  She has not had any similar episodes since last seen by [place]. She has had occasional episodes of mild jaw tingling sensation, not noted with exertion. Also reports one episode indigestion. She was started on Crestor at her last visit.   She continues to have palpitations similar to before that are stable. She is active, states that she walks her dogs several times a day around her [place] and tolerates this well. Has cramping in her legs at night, but no symptoms of claudication.   She does continue to smoke 15 cigarettes per day. Previously used chantix, but states it was the wrong time to try to quit as her mother and sister had died recently at that time.   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 Yates Decamp), prescribed verapamil   Pneumonia    PONV (postoperative nausea and vomiting)     Past Surgical History:  Procedure Laterality Date     ABDOMINAL HYSTERECTOMY     partial   CESAREAN SECTION     COLONOSCOPY     ETHMOIDECTOMY Bilateral 06/26/2014   Procedure: BILATERAL ANTERIOR ETHMOIDECTOMY;  Surgeon: Drema Halon, MD;  Location: [facility];  Service: ENT;  Laterality: Bilateral;   MAXILLARY ANTROSTOMY Bilateral 06/26/2014   Procedure: BILATERAL MAXILLARY OSTEA ENLARGEMENT;  Surgeon: Drema Halon, MD;  Location: [facility];  Service: ENT;  Laterality: Bilateral;   NASAL SEPTOPLASTY W/ TURBINOPLASTY Bilateral 06/26/2014   Procedure: BILATERAL NASAL SEPTOPLASTY WITH TURBINATE REDUCTION;  Surgeon: Drema Halon, MD;  Location: [facility];  Service: ENT;  Laterality: Bilateral;   SHOULDER ARTHROSCOPY W/ ROTATOR CUFF REPAIR  6/15   right   TONSILLECTOMY AND ADENOIDECTOMY     TUBAL LIGATION      Social History   Socioeconomic History   Marital status: Married    Spouse name: Not on file   Number of children: 2   Years of education: 12   Highest education level: Not on file  Occupational History   Occupation: unemployed  Social Network engineer strain: Not on file   Food insecurity    Worry: Not on file    Inability: Not on file   Transportation needs    Medical: Not on file  Non-medical: Not on file  Tobacco Use   Smoking status: Current Every Day Smoker    Packs/day: 0.50    Years: 35.00    Pack years: 17.50    Types: Cigarettes   Smokeless tobacco: Never Used  Substance and Sexual Activity   Alcohol use: No   Drug use: No   Sexual activity: Not on file  Lifestyle   Physical activity    Days per week: Not on file    Minutes per session: Not on file   Stress: Not on file  Relationships   Social connections    Talks on phone: Not on file    Gets together: Not on file    Attends religious service: Not on file    Active member of club or organization: Not on file    Attends meetings of clubs or  organizations: Not on file    Relationship status: Not on file   Intimate partner violence    Fear of current or ex partner: Not on file    Emotionally abused: Not on file    Physically abused: Not on file    Forced sexual activity: Not on file  Other Topics Concern   Not on file  Social History Narrative   Patient drinks about 3-4 cups of caffeine daily.    Patient is right handed.     Review of Systems  Constitution: Negative for decreased appetite, malaise/fatigue, weight gain and weight loss.  Eyes: Negative for visual disturbance.  Cardiovascular: Positive for palpitations. Negative for chest pain, claudication, dyspnea on exertion, leg swelling, orthopnea and syncope.  Respiratory: Negative for hemoptysis and wheezing.   Endocrine: Negative for cold intolerance and heat intolerance.  Hematologic/Lymphatic: Does not bruise/bleed easily.  Skin: Negative for nail changes.  Musculoskeletal: Positive for back pain. Negative for muscle weakness and myalgias.  Gastrointestinal: Negative for abdominal pain, change in bowel habit, nausea and vomiting.  Neurological: Negative for difficulty with concentration, dizziness, focal weakness and headaches.  Psychiatric/Behavioral: Negative for altered mental status and suicidal ideas.  All other systems reviewed and are negative.     Objective:  Blood pressure (!) 157/89, pulse 76, temperature 97.7 F (36.5 C), height 5\' 3"  (1.6 m), weight 141 lb (64 kg), SpO2 97 %. Body mass index is 24.98 kg/m.    Physical Exam  Constitutional: She is oriented to person, place, and time. Vital signs are normal. She appears well-developed and well-nourished.  HENT:  Head: Normocephalic and atraumatic.  Neck: Normal range of motion.  Cardiovascular: Normal rate, regular rhythm, normal heart sounds and intact distal pulses.  Pulmonary/Chest: Effort normal and breath sounds normal. No accessory muscle usage. No respiratory distress.  Abdominal: Soft.  Bowel sounds are normal.  Musculoskeletal: Normal range of motion.  Neurological: She is alert and oriented to person, place, and time.  Skin: Skin is warm and dry.  Vitals reviewed.  Radiology:  Coronary CTA 12/25/18:  1. The left main is short and FFRct cannot be modeled in this region.  2. FFRct in the proximal LAD is mildly abnormal. However modeling demonstrates that opening the left main stenosis results in a significant improvement in flow in the proximal LAD.  3. FFRct demonstrates significant stenoses in the proximal and mid LAD. Based on modeling, opening the LM and proximal stenosis does not normalize flow more distally.  4. Recommend cardiac catheterization. Take note of concern for ostial left main disease.  Laboratory examination:    CMP Latest Ref Rng & Units 04/23/2018  02/02/2017 12/10/2015  °Glucose 70 - 99 mg/dL 95 89 112(H)  °BUN 6 - 20 mg/dL 11 6 13  °Creatinine 0.44 - 1.00 mg/dL 0.86 0.90 1.05(H)  °Sodium 135 - 145 mmol/L 141 140 141  °Potassium 3.5 - 5.1 mmol/L 3.7 4.4 3.9  °Chloride 98 - 111 mmol/L 108 109 107  °CO2 22 - 32 mmol/L 22 25 26  °Calcium 8.9 - 10.3 mg/dL 8.9 9.1 10.1  °Total Protein 6.0 - 8.3 g/dL - - -  °Total Bilirubin 0.3 - 1.2 mg/dL - - -  °Alkaline Phos 39 - 117 U/L - - -  °AST 0 - 37 U/L - - -  °ALT 0 - 35 U/L - - -  ° °CBC Latest Ref Rng & Units 04/23/2018 02/02/2017 12/10/2015  °WBC 4.0 - 10.5 K/uL 8.5 9.3 11.8(H)  °Hemoglobin 12.0 - 15.0 g/dL 14.0 14.2 13.5  °Hematocrit 36.0 - 46.0 % 43.4 42.9 42.1  °Platelets 150 - 400 K/uL 176 211 240  ° °Lipid Panel  °   °Component Value Date/Time  ° CHOL 197 04/10/2012 0414  ° TRIG 251 (H) 04/10/2012 0414  ° HDL 42 04/10/2012 0414  ° CHOLHDL 4.7 04/10/2012 0414  ° VLDL 50 (H) 04/10/2012 0414  ° LDLCALC 105 (H) 04/10/2012 0414  ° °HEMOGLOBIN A1C °Lab Results  °Component Value Date  ° HGBA1C 5.6 04/10/2012  ° MPG 114 04/10/2012  ° °TSH °No results for input(s): TSH in the last 8760 hours. ° °PRN Meds:. °Medications  Discontinued During This Encounter  °Medication Reason  °• meclizine (ANTIVERT) 12.5 MG tablet Error  °• predniSONE (STERAPRED UNI-PAK 21 TAB) 10 MG (21) TBPK tablet Error  °• varenicline (CHANTIX) 0.5 MG tablet Error  ° °Current Meds  °Medication Sig  °• ALPRAZolam (XANAX) 0.5 MG tablet Take 0.5 mg by mouth at bedtime as needed for anxiety.  °• cetirizine (ZYRTEC) 10 MG tablet Take 10 mg by mouth daily.  °• Cholecalciferol (D3-1000) 25 MCG (1000 UT) capsule Take 1,000 Units by mouth daily.  °• gabapentin (NEURONTIN) 300 MG capsule Take 1 capsule (300 mg total) by mouth 3 (three) times daily.  °• HYDROcodone-acetaminophen (NORCO/VICODIN) 5-325 MG tablet Take 1 tablet by mouth 4 (four) times daily as needed for moderate pain.  °• nitroGLYCERIN (NITROSTAT) 0.4 MG SL tablet Place 1 tablet (0.4 mg total) under the tongue every 5 (five) minutes as needed for chest pain.  °• omeprazole (PRILOSEC) 40 MG capsule Take 1 capsule (40 mg total) by mouth daily.  °• promethazine (PHENERGAN) 25 MG tablet Take 25 mg every 6 (six) hours as needed by mouth for nausea or vomiting.  °• rosuvastatin (CRESTOR) 10 MG tablet Take 10 mg by mouth daily.  °• SUMAtriptan (IMITREX) 100 MG tablet Take 100 mg every 2 (two) hours as needed by mouth for migraine.   °• verapamil (CALAN-SR) 180 MG CR tablet Take 180 mg daily by mouth.   °• Vitamin D, Ergocalciferol, (DRISDOL) 1.25 MG (50000 UT) CAPS capsule Take 50,000 Units by mouth every 7 (seven) days.  ° ° °Cardiac Studies:  ° °Nuclear stress test  [07/20/2015]: 1. The resting electrocardiogram demonstrated normal sinus rhythm, normal resting conduction, no resting arrhythmias and normal rest repolarization. The stress electrocardiogram was normal. Patient exercised on Bruce protocol for 8:35 minutes and achieved 10.16 METS. Stress test terminated due to target heart rate (86% MPHR). °Stress symptoms included dyspnea, dizziness. The stress test was terminated because of fatigue. °2. Myocardial  perfusion imaging is normal. Overall left ventricular systolic   function was normal without regional wall motion abnormalities. The left ventricular ejection fraction was 54%.   Echocardiogram 07/21/2015: Left ventricle cavity is normal in size. Mild concentric hypertrophy of the left ventricle. Normal global wall motion. Normal diastolic filling pattern. Calculated EF 56%. Left atrial cavity is normal in size. Trace mitral regurgitation. Trace pulmonic regurgitation.  Assessment:   Angina at rest [facility])  Essential hypertension  NSVT (nonsustained ventricular tachycardia) (HCC)  Tobacco use disorder  EKG 12/13/2018: Normal sinus rhythm at 61 bpm, normal axis, no evidence of ischemia.  Recommendations:   I discussed coronary CTA results with the patient, in view of abnormal findings and concern of left main disease, I have recommended coronary angiogram for further evaluation. Schedule for cardiac catheterization, and possible angioplasty. We discussed regarding risks, benefits, alternatives to this including stress testing, CTA and continued medical therapy. Patient wants to proceed. Understands <1-2% risk of death, stroke, MI, urgent CABG, bleeding, infection, renal failure but not limited to these.  She has not had any recent episodes of angina since last evaluation except for one episode of jaw tingling sensation.  She has nitroglycerin for as needed use.  I will start her on metoprolol tartrate for antianginal therapy.  We will change Crestor to 40 mg for efficacy.  She has had history of NSVT on previous montior. Does have occasional palpitations.   She has been able to cut back to 7 cigarettes/day, strongly encouraged her to continue to work on complete smoking cessation.  Congratulated her on her recent efforts.  We will see her back after the procedure for follow-up.   Toniann FailAshton Haynes Samreen Seltzer, MSN, APRN, FNP-C [facility] Office: [phone] Fax: [phone]

## 2019-01-05 LAB — CBC
Hematocrit: 43.9 % (ref 34.0–46.6)
Hemoglobin: 14.6 g/dL (ref 11.1–15.9)
MCH: 28.4 pg (ref 26.6–33.0)
MCHC: 33.3 g/dL (ref 31.5–35.7)
MCV: 85 fL (ref 79–97)
Platelets: 214 10*3/uL (ref 150–450)
RBC: 5.14 x10E6/uL (ref 3.77–5.28)
RDW: 12.7 % (ref 11.7–15.4)
WBC: 8.3 10*3/uL (ref 3.4–10.8)

## 2019-01-05 LAB — BASIC METABOLIC PANEL
BUN/Creatinine Ratio: 13 (ref 9–23)
BUN: 11 mg/dL (ref 6–24)
CO2: 24 mmol/L (ref 20–29)
Calcium: 9.2 mg/dL (ref 8.7–10.2)
Chloride: 103 mmol/L (ref 96–106)
Creatinine, Ser: 0.85 mg/dL (ref 0.57–1.00)
GFR calc Af Amer: 89 mL/min/{1.73_m2} (ref >59)
GFR calc non Af Amer: 77 mL/min/{1.73_m2} (ref >59)
Glucose: 95 mg/dL (ref 65–99)
Potassium: 4.5 mmol/L (ref 3.5–5.2)
Sodium: 141 mmol/L (ref 134–144)

## 2019-01-07 LAB — NOVEL CORONAVIRUS, NAA (HOSP ORDER, SEND-OUT TO REF LAB; TAT 18-24 HRS): SARS-CoV-2, NAA: NOT DETECTED

## 2019-01-08 ENCOUNTER — Inpatient Hospital Stay (HOSPITAL_COMMUNITY): Payer: Medicare HMO

## 2019-01-08 ENCOUNTER — Inpatient Hospital Stay (HOSPITAL_COMMUNITY)
Admission: AD | Admit: 2019-01-08 | Discharge: 2019-01-16 | DRG: 234 | Disposition: A | Discharge status: Home Health | Payer: Medicare HMO | Attending: Thoracic Surgery (Cardiothoracic Vascular Surgery) | Admitting: Thoracic Surgery (Cardiothoracic Vascular Surgery)

## 2019-01-08 ENCOUNTER — Encounter (HOSPITAL_COMMUNITY)
Admission: AD | Disposition: A | Discharge status: Home Health | Payer: Self-pay | Source: Home / Self Care | Attending: Thoracic Surgery (Cardiothoracic Vascular Surgery)

## 2019-01-08 ENCOUNTER — Other Ambulatory Visit: Payer: Self-pay

## 2019-01-08 DIAGNOSIS — M549 Dorsalgia, unspecified: Secondary | ICD-10-CM | POA: Diagnosis present

## 2019-01-08 DIAGNOSIS — R070 Pain in throat: Secondary | ICD-10-CM | POA: Diagnosis not present

## 2019-01-08 DIAGNOSIS — I10 Essential (primary) hypertension: Secondary | ICD-10-CM | POA: Diagnosis present

## 2019-01-08 DIAGNOSIS — G8929 Other chronic pain: Secondary | ICD-10-CM | POA: Diagnosis present

## 2019-01-08 DIAGNOSIS — F17210 Nicotine dependence, cigarettes, uncomplicated: Secondary | ICD-10-CM | POA: Diagnosis present

## 2019-01-08 DIAGNOSIS — Z8249 Family history of ischemic heart disease and other diseases of the circulatory system: Secondary | ICD-10-CM | POA: Diagnosis not present

## 2019-01-08 DIAGNOSIS — I200 Unstable angina: Secondary | ICD-10-CM | POA: Diagnosis present

## 2019-01-08 DIAGNOSIS — J9811 Atelectasis: Secondary | ICD-10-CM | POA: Diagnosis not present

## 2019-01-08 DIAGNOSIS — R0602 Shortness of breath: Secondary | ICD-10-CM

## 2019-01-08 DIAGNOSIS — I25110 Atherosclerotic heart disease of native coronary artery with unstable angina pectoris: Secondary | ICD-10-CM | POA: Diagnosis present

## 2019-01-08 DIAGNOSIS — D62 Acute posthemorrhagic anemia: Secondary | ICD-10-CM | POA: Diagnosis not present

## 2019-01-08 DIAGNOSIS — R062 Wheezing: Secondary | ICD-10-CM | POA: Diagnosis not present

## 2019-01-08 DIAGNOSIS — Z7982 Long term (current) use of aspirin: Secondary | ICD-10-CM | POA: Diagnosis not present

## 2019-01-08 DIAGNOSIS — K219 Gastro-esophageal reflux disease without esophagitis: Secondary | ICD-10-CM | POA: Diagnosis present

## 2019-01-08 DIAGNOSIS — M7989 Other specified soft tissue disorders: Secondary | ICD-10-CM | POA: Diagnosis not present

## 2019-01-08 DIAGNOSIS — Z9689 Presence of other specified functional implants: Secondary | ICD-10-CM

## 2019-01-08 DIAGNOSIS — E7800 Pure hypercholesterolemia, unspecified: Secondary | ICD-10-CM | POA: Diagnosis present

## 2019-01-08 DIAGNOSIS — Z951 Presence of aortocoronary bypass graft: Secondary | ICD-10-CM

## 2019-01-08 DIAGNOSIS — R079 Chest pain, unspecified: Secondary | ICD-10-CM | POA: Diagnosis present

## 2019-01-08 DIAGNOSIS — Z803 Family history of malignant neoplasm of breast: Secondary | ICD-10-CM | POA: Diagnosis not present

## 2019-01-08 DIAGNOSIS — R0689 Other abnormalities of breathing: Secondary | ICD-10-CM | POA: Diagnosis present

## 2019-01-08 DIAGNOSIS — Z87442 Personal history of urinary calculi: Secondary | ICD-10-CM

## 2019-01-08 DIAGNOSIS — R531 Weakness: Secondary | ICD-10-CM | POA: Diagnosis not present

## 2019-01-08 DIAGNOSIS — R202 Paresthesia of skin: Secondary | ICD-10-CM | POA: Diagnosis not present

## 2019-01-08 DIAGNOSIS — B370 Candidal stomatitis: Secondary | ICD-10-CM | POA: Diagnosis not present

## 2019-01-08 DIAGNOSIS — E785 Hyperlipidemia, unspecified: Secondary | ICD-10-CM | POA: Diagnosis present

## 2019-01-08 DIAGNOSIS — Z8719 Personal history of other diseases of the digestive system: Secondary | ICD-10-CM

## 2019-01-08 DIAGNOSIS — I472 Ventricular tachycardia: Secondary | ICD-10-CM | POA: Diagnosis present

## 2019-01-08 DIAGNOSIS — F329 Major depressive disorder, single episode, unspecified: Secondary | ICD-10-CM | POA: Diagnosis present

## 2019-01-08 DIAGNOSIS — Z801 Family history of malignant neoplasm of trachea, bronchus and lung: Secondary | ICD-10-CM

## 2019-01-08 DIAGNOSIS — F419 Anxiety disorder, unspecified: Secondary | ICD-10-CM | POA: Diagnosis present

## 2019-01-08 DIAGNOSIS — I25118 Atherosclerotic heart disease of native coronary artery with other forms of angina pectoris: Secondary | ICD-10-CM | POA: Diagnosis not present

## 2019-01-08 DIAGNOSIS — Z90710 Acquired absence of both cervix and uterus: Secondary | ICD-10-CM

## 2019-01-08 DIAGNOSIS — J90 Pleural effusion, not elsewhere classified: Secondary | ICD-10-CM

## 2019-01-08 DIAGNOSIS — Z20828 Contact with and (suspected) exposure to other viral communicable diseases: Secondary | ICD-10-CM | POA: Diagnosis present

## 2019-01-08 DIAGNOSIS — Z01810 Encounter for preprocedural cardiovascular examination: Secondary | ICD-10-CM | POA: Diagnosis not present

## 2019-01-08 DIAGNOSIS — Z79899 Other long term (current) drug therapy: Secondary | ICD-10-CM

## 2019-01-08 DIAGNOSIS — R069 Unspecified abnormalities of breathing: Secondary | ICD-10-CM

## 2019-01-08 DIAGNOSIS — Z79891 Long term (current) use of opiate analgesic: Secondary | ICD-10-CM

## 2019-01-08 HISTORY — PX: LEFT HEART CATH AND CORONARY ANGIOGRAPHY: CATH118249

## 2019-01-08 LAB — CREATININE, SERUM
Creatinine, Ser: 0.83 mg/dL (ref 0.44–1.00)
GFR calc Af Amer: >60 mL/min (ref >60)
GFR calc non Af Amer: >60 mL/min (ref >60)

## 2019-01-08 LAB — URINALYSIS, ROUTINE W REFLEX MICROSCOPIC
Bacteria, UA: NONE SEEN
Bilirubin Urine: NEGATIVE
Glucose, UA: NEGATIVE mg/dL
Ketones, ur: NEGATIVE mg/dL
Leukocytes,Ua: NEGATIVE
Nitrite: NEGATIVE
Protein, ur: NEGATIVE mg/dL
Specific Gravity, Urine: 1.016 (ref 1.005–1.030)
pH: 7 (ref 5.0–8.0)

## 2019-01-08 LAB — CBC
HCT: 44.8 % (ref 36.0–46.0)
Hemoglobin: 14.7 g/dL (ref 12.0–15.0)
MCH: 29.5 pg (ref 26.0–34.0)
MCHC: 32.8 g/dL (ref 30.0–36.0)
MCV: 90 fL (ref 80.0–100.0)
Platelets: 219 10*3/uL (ref 150–400)
RBC: 4.98 MIL/uL (ref 3.87–5.11)
RDW: 12.9 % (ref 11.5–15.5)
WBC: 8.7 10*3/uL (ref 4.0–10.5)
nRBC: 0 % (ref 0.0–0.2)

## 2019-01-08 LAB — HEMOGLOBIN A1C
Hgb A1c MFr Bld: 6 % — ABNORMAL HIGH (ref 4.8–5.6)
Mean Plasma Glucose: 125.5 mg/dL

## 2019-01-08 LAB — PREPARE RBC (CROSSMATCH)

## 2019-01-08 LAB — HIV ANTIBODY (ROUTINE TESTING W REFLEX): HIV Screen 4th Generation wRfx: NONREACTIVE

## 2019-01-08 SURGERY — LEFT HEART CATH AND CORONARY ANGIOGRAPHY
Anesthesia: LOCAL

## 2019-01-08 MED ORDER — HEPARIN SODIUM (PORCINE) 1000 UNIT/ML IJ SOLN
INTRAMUSCULAR | Status: AC
  Filled 2019-01-08: qty 1

## 2019-01-08 MED ORDER — ASPIRIN 81 MG PO CHEW: 81.0000 mg | CHEWABLE_TABLET | Freq: Every day | ORAL | Status: DC

## 2019-01-08 MED ORDER — NITROGLYCERIN IN D5W 200-5 MCG/ML-% IV SOLN
INTRAVENOUS | Status: AC
  Filled 2019-01-08: qty 250

## 2019-01-08 MED ORDER — SODIUM CHLORIDE 0.9 % IV SOLN
1.5000 g | INTRAVENOUS | Status: DC
  Filled 2019-01-08: qty 1.5

## 2019-01-08 MED ORDER — FENTANYL CITRATE (PF) 100 MCG/2ML IJ SOLN
INTRAMUSCULAR | Status: AC
  Filled 2019-01-08: qty 2

## 2019-01-08 MED ORDER — LABETALOL HCL 5 MG/ML IV SOLN: 10.0000 mg | INTRAVENOUS | Status: AC | PRN

## 2019-01-08 MED ORDER — SODIUM CHLORIDE 0.9% FLUSH
3.0000 mL | Freq: Two times a day (BID) | INTRAVENOUS | Status: DC
  Administered 2019-01-08: 3 mL via INTRAVENOUS

## 2019-01-08 MED ORDER — ONDANSETRON HCL 4 MG/2ML IJ SOLN: 4.0000 mg | Freq: Four times a day (QID) | INTRAMUSCULAR | Status: DC | PRN

## 2019-01-08 MED ORDER — SODIUM CHLORIDE 0.9 % IV SOLN: 250.0000 mL | INTRAVENOUS | Status: DC | PRN

## 2019-01-08 MED ORDER — ROSUVASTATIN CALCIUM 20 MG PO TABS
40.0000 mg | ORAL_TABLET | Freq: Every day | ORAL | Status: DC
  Administered 2019-01-08: 40 mg via ORAL
  Filled 2019-01-08: qty 2

## 2019-01-08 MED ORDER — VANCOMYCIN HCL 10 G IV SOLR
1250.0000 mg | INTRAVENOUS | Status: DC
  Filled 2019-01-08: qty 1250

## 2019-01-08 MED ORDER — NITROGLYCERIN 0.4 MG SL SUBL: 0.4000 mg | SUBLINGUAL_TABLET | SUBLINGUAL | Status: DC | PRN

## 2019-01-08 MED ORDER — SODIUM CHLORIDE 0.9 % IV SOLN
INTRAVENOUS | Status: DC
  Filled 2019-01-08: qty 30

## 2019-01-08 MED ORDER — GABAPENTIN 300 MG PO CAPS
300.0000 mg | ORAL_CAPSULE | Freq: Three times a day (TID) | ORAL | Status: DC
  Administered 2019-01-08 (×2): 300 mg via ORAL
  Filled 2019-01-08 (×2): qty 1

## 2019-01-08 MED ORDER — HEPARIN SODIUM (PORCINE) 1000 UNIT/ML IJ SOLN
INTRAMUSCULAR | Status: DC | PRN
  Administered 2019-01-08: 3500 [IU] via INTRAVENOUS

## 2019-01-08 MED ORDER — NITROGLYCERIN IN D5W 200-5 MCG/ML-% IV SOLN
0.0000 ug/min | INTRAVENOUS | Status: DC
  Administered 2019-01-09: 16.6 ug/min via INTRAVENOUS

## 2019-01-08 MED ORDER — ACETAMINOPHEN 325 MG PO TABS: 650.0000 mg | ORAL_TABLET | ORAL | Status: DC | PRN

## 2019-01-08 MED ORDER — SODIUM CHLORIDE 0.9 % WEIGHT BASED INFUSION: 1.0000 mL/kg/h | INTRAVENOUS | Status: DC

## 2019-01-08 MED ORDER — CHLORHEXIDINE GLUCONATE 0.12 % MT SOLN
15.0000 mL | Freq: Once | OROMUCOSAL | Status: AC
  Administered 2019-01-09: 15 mL via OROMUCOSAL
  Filled 2019-01-08: qty 15

## 2019-01-08 MED ORDER — SODIUM CHLORIDE 0.9 % IV SOLN
750.0000 mg | INTRAVENOUS | Status: DC
  Filled 2019-01-08: qty 750

## 2019-01-08 MED ORDER — PLASMA-LYTE 148 IV SOLN
INTRAVENOUS | Status: DC
  Filled 2019-01-08: qty 2.5

## 2019-01-08 MED ORDER — DEXMEDETOMIDINE HCL IN NACL 400 MCG/100ML IV SOLN
0.1000 ug/kg/h | INTRAVENOUS | Status: DC
  Filled 2019-01-08: qty 100

## 2019-01-08 MED ORDER — SODIUM CHLORIDE 0.9% FLUSH: 3.0000 mL | INTRAVENOUS | Status: DC | PRN

## 2019-01-08 MED ORDER — EPINEPHRINE HCL 5 MG/250ML IV SOLN IN NS
0.0000 ug/min | INTRAVENOUS | Status: DC
  Filled 2019-01-08: qty 250

## 2019-01-08 MED ORDER — ASPIRIN EC 81 MG PO TBEC: 81.0000 mg | DELAYED_RELEASE_TABLET | Freq: Every day | ORAL | Status: DC

## 2019-01-08 MED ORDER — METOPROLOL TARTRATE 12.5 MG HALF TABLET
12.5000 mg | ORAL_TABLET | Freq: Once | ORAL | Status: AC
  Administered 2019-01-09: 12.5 mg via ORAL
  Filled 2019-01-08: qty 1

## 2019-01-08 MED ORDER — MIDAZOLAM HCL 2 MG/2ML IJ SOLN
INTRAMUSCULAR | Status: AC
  Filled 2019-01-08: qty 2

## 2019-01-08 MED ORDER — ASPIRIN 81 MG PO CHEW: 81.0000 mg | CHEWABLE_TABLET | ORAL | Status: DC

## 2019-01-08 MED ORDER — TEMAZEPAM 15 MG PO CAPS: 15.0000 mg | ORAL_CAPSULE | Freq: Once | ORAL | Status: DC | PRN

## 2019-01-08 MED ORDER — CHLORHEXIDINE GLUCONATE CLOTH 2 % EX PADS
6.0000 | MEDICATED_PAD | Freq: Once | CUTANEOUS | Status: AC
  Administered 2019-01-09: 6 via TOPICAL

## 2019-01-08 MED ORDER — HEPARIN (PORCINE) IN NACL 1000-0.9 UT/500ML-% IV SOLN
INTRAVENOUS | Status: AC
  Filled 2019-01-08: qty 1000

## 2019-01-08 MED ORDER — LIDOCAINE HCL (PF) 1 % IJ SOLN
INTRAMUSCULAR | Status: DC | PRN
  Administered 2019-01-08: 2 mL

## 2019-01-08 MED ORDER — VERAPAMIL HCL 2.5 MG/ML IV SOLN
INTRAVENOUS | Status: AC
  Filled 2019-01-08: qty 2

## 2019-01-08 MED ORDER — TRANEXAMIC ACID 1000 MG/10ML IV SOLN
1.5000 mg/kg/h | INTRAVENOUS | Status: DC
  Filled 2019-01-08: qty 25

## 2019-01-08 MED ORDER — IOHEXOL 350 MG/ML SOLN
INTRAVENOUS | Status: DC | PRN
  Administered 2019-01-08: 60 mL via INTRACARDIAC

## 2019-01-08 MED ORDER — INSULIN REGULAR(HUMAN) IN NACL 100-0.9 UT/100ML-% IV SOLN
INTRAVENOUS | Status: DC
  Filled 2019-01-08: qty 100

## 2019-01-08 MED ORDER — LIDOCAINE HCL (PF) 1 % IJ SOLN
INTRAMUSCULAR | Status: AC
  Filled 2019-01-08: qty 30

## 2019-01-08 MED ORDER — PROMETHAZINE HCL 25 MG PO TABS: 25.0000 mg | ORAL_TABLET | Freq: Four times a day (QID) | ORAL | Status: DC | PRN

## 2019-01-08 MED ORDER — HYDRALAZINE HCL 20 MG/ML IJ SOLN: 10.0000 mg | INTRAMUSCULAR | Status: AC | PRN

## 2019-01-08 MED ORDER — MIDAZOLAM HCL 2 MG/2ML IJ SOLN
INTRAMUSCULAR | Status: DC | PRN
  Administered 2019-01-08: 1 mg via INTRAVENOUS

## 2019-01-08 MED ORDER — HYDROCODONE-ACETAMINOPHEN 5-325 MG PO TABS
1.0000 | ORAL_TABLET | Freq: Four times a day (QID) | ORAL | Status: DC | PRN
  Administered 2019-01-08: 1 via ORAL
  Filled 2019-01-08: qty 1

## 2019-01-08 MED ORDER — NITROGLYCERIN IN D5W 200-5 MCG/ML-% IV SOLN
2.0000 ug/min | INTRAVENOUS | Status: DC
  Filled 2019-01-08: qty 250

## 2019-01-08 MED ORDER — FENTANYL CITRATE (PF) 100 MCG/2ML IJ SOLN
INTRAMUSCULAR | Status: DC | PRN
  Administered 2019-01-08: 50 ug via INTRAVENOUS

## 2019-01-08 MED ORDER — ASPIRIN 81 MG PO CHEW: 324.0000 mg | CHEWABLE_TABLET | ORAL | Status: DC

## 2019-01-08 MED ORDER — MILRINONE LACTATE IN DEXTROSE 20-5 MG/100ML-% IV SOLN
0.3000 ug/kg/min | INTRAVENOUS | Status: DC
  Filled 2019-01-08: qty 100

## 2019-01-08 MED ORDER — NITROGLYCERIN IN D5W 200-5 MCG/ML-% IV SOLN
INTRAVENOUS | Status: AC | PRN
  Administered 2019-01-08: 3 ug/min via INTRAVENOUS

## 2019-01-08 MED ORDER — TRANEXAMIC ACID (OHS) PUMP PRIME SOLUTION
2.0000 mg/kg | INTRAVENOUS | Status: DC
  Filled 2019-01-08: qty 1.29

## 2019-01-08 MED ORDER — SODIUM CHLORIDE 0.9 % IV SOLN
INTRAVENOUS | Status: AC
  Administered 2019-01-08: 19:00:00 via INTRAVENOUS

## 2019-01-08 MED ORDER — MANNITOL 20 % IV SOLN
Freq: Once | INTRAVENOUS | Status: DC
  Filled 2019-01-08: qty 13

## 2019-01-08 MED ORDER — ALPRAZOLAM 0.5 MG PO TABS
0.5000 mg | ORAL_TABLET | Freq: Two times a day (BID) | ORAL | Status: DC
  Administered 2019-01-08: 0.5 mg via ORAL
  Filled 2019-01-08: qty 1

## 2019-01-08 MED ORDER — SUMATRIPTAN SUCCINATE 100 MG PO TABS
100.0000 mg | ORAL_TABLET | ORAL | Status: DC | PRN
  Filled 2019-01-08: qty 1

## 2019-01-08 MED ORDER — ASPIRIN 81 MG PO CHEW
CHEWABLE_TABLET | ORAL | Status: AC
  Administered 2019-01-08: 81 mg
  Filled 2019-01-08: qty 1

## 2019-01-08 MED ORDER — SODIUM CHLORIDE 0.9% FLUSH: 3.0000 mL | Freq: Two times a day (BID) | INTRAVENOUS | Status: DC

## 2019-01-08 MED ORDER — PANTOPRAZOLE SODIUM 40 MG PO TBEC
40.0000 mg | DELAYED_RELEASE_TABLET | Freq: Every day | ORAL | Status: DC
  Administered 2019-01-08: 40 mg via ORAL
  Filled 2019-01-08: qty 1

## 2019-01-08 MED ORDER — TRANEXAMIC ACID (OHS) BOLUS VIA INFUSION
15.0000 mg/kg | INTRAVENOUS | Status: DC
  Filled 2019-01-08: qty 966

## 2019-01-08 MED ORDER — CHLORHEXIDINE GLUCONATE CLOTH 2 % EX PADS
6.0000 | MEDICATED_PAD | Freq: Once | CUTANEOUS | Status: AC
  Administered 2019-01-08: 22:00:00 6 via TOPICAL

## 2019-01-08 MED ORDER — POTASSIUM CHLORIDE 2 MEQ/ML IV SOLN
80.0000 meq | INTRAVENOUS | Status: DC
  Filled 2019-01-08: qty 40

## 2019-01-08 MED ORDER — PHENYLEPHRINE HCL-NACL 20-0.9 MG/250ML-% IV SOLN
30.0000 ug/min | INTRAVENOUS | Status: DC
  Filled 2019-01-08: qty 250

## 2019-01-08 MED ORDER — DOPAMINE-DEXTROSE 3.2-5 MG/ML-% IV SOLN
0.0000 ug/kg/min | INTRAVENOUS | Status: DC
  Filled 2019-01-08: qty 250

## 2019-01-08 MED ORDER — VERAPAMIL HCL 2.5 MG/ML IV SOLN
INTRAVENOUS | Status: DC | PRN
  Administered 2019-01-08: 10 mL via INTRA_ARTERIAL

## 2019-01-08 MED ORDER — VERAPAMIL HCL ER 180 MG PO TBCR
180.0000 mg | EXTENDED_RELEASE_TABLET | Freq: Every day | ORAL | Status: DC
  Administered 2019-01-08: 180 mg via ORAL
  Filled 2019-01-08 (×2): qty 1

## 2019-01-08 MED ORDER — BISACODYL 5 MG PO TBEC: 5.0000 mg | DELAYED_RELEASE_TABLET | Freq: Once | ORAL | Status: DC

## 2019-01-08 MED ORDER — MAGNESIUM SULFATE 50 % IJ SOLN
40.0000 meq | INTRAMUSCULAR | Status: DC
  Filled 2019-01-08: qty 9.85

## 2019-01-08 MED ORDER — ASPIRIN 300 MG RE SUPP: 300.0000 mg | RECTAL | Status: DC

## 2019-01-08 MED ORDER — SODIUM CHLORIDE 0.9 % WEIGHT BASED INFUSION
3.0000 mL/kg/h | INTRAVENOUS | Status: AC
  Administered 2019-01-08: 3 mL/kg/h via INTRAVENOUS

## 2019-01-08 MED ORDER — NICOTINE 7 MG/24HR TD PT24
7.0000 mg | MEDICATED_PATCH | Freq: Every day | TRANSDERMAL | Status: DC
  Filled 2019-01-08 (×2): qty 1

## 2019-01-08 MED ORDER — HEPARIN SODIUM (PORCINE) 5000 UNIT/ML IJ SOLN
5000.0000 [IU] | Freq: Three times a day (TID) | INTRAMUSCULAR | Status: DC
  Administered 2019-01-08: 5000 [IU] via SUBCUTANEOUS
  Filled 2019-01-08 (×2): qty 1

## 2019-01-08 MED ORDER — NOREPINEPHRINE 4 MG/250ML-% IV SOLN
0.0000 ug/min | INTRAVENOUS | Status: DC
  Filled 2019-01-08: qty 250

## 2019-01-08 MED ORDER — HEPARIN (PORCINE) IN NACL 1000-0.9 UT/500ML-% IV SOLN
INTRAVENOUS | Status: DC | PRN
  Administered 2019-01-08 (×2): 500 mL

## 2019-01-08 SURGICAL SUPPLY — 13 items
CATH INFINITI 5 FR AR1 MOD (CATHETERS) ×2 IMPLANT
CATH INFINITI 5 FR JL3.5 (CATHETERS) ×2 IMPLANT
CATH INFINITI JR4 5F (CATHETERS) ×2 IMPLANT
CATH OPTITORQUE TIG 4.0 5F (CATHETERS) ×2 IMPLANT
DEVICE RAD COMP TR BAND LRG (VASCULAR PRODUCTS) ×2 IMPLANT
GLIDESHEATH SLEND A-KIT 6F 22G (SHEATH) ×2 IMPLANT
GUIDEWIRE INQWIRE 1.5J.035X260 (WIRE) ×1 IMPLANT
INQWIRE 1.5J .035X260CM (WIRE) ×2
KIT HEART LEFT (KITS) ×2 IMPLANT
PACK CARDIAC CATHETERIZATION (CUSTOM PROCEDURE TRAY) ×2 IMPLANT
SHEATH PROBE COVER 6X72 (BAG) ×2 IMPLANT
TRANSDUCER W/STOPCOCK (MISCELLANEOUS) ×2 IMPLANT
TUBING CIL FLEX 10 FLL-RA (TUBING) ×2 IMPLANT

## 2019-01-08 NOTE — Consult Note (Signed)
[address]       [place] [address]             [phone]        Regina Perry [facility] Medical Record #[id number] Date of Birth: Apr 29, 1960  Referring: No ref. provider found Primary Care: Elisabeth Most, FNP Primary Cardiologist:No primary care provider on file.  Chief Complaint:   No chief complaint on file.   History of Present Illness:     58 yo female admitted to the hospital following a LHC which showed severe LM disease.  Following the procedure, she had some anginal chest pain, and was placed on nitro gtt.  She states that she had anginal symptoms that woke her from sleep recently.  She also had similar symptoms in January, and with mild exertion.    She currently has indigestion   Past Medical and Surgical History: Previous Chest Surgery: no Previous Chest Radiation: no Diabetes Mellitus: no.  HbA1C pending Creatinine: 0.85  Past Medical History:  Diagnosis Date  . Anxiety   . Bronchitis   . Depression   . Diverticulitis   . GERD (gastroesophageal reflux disease)   . H/O cesarean section   . History of hiatal hernia   . History of kidney stones    LEFT KIDNEY 2 STONES JUST WATCHING( [facility])  . Hypercholesteremia   . Hyperplastic colon polyp    12/30/2008  . Hypertension   . Migraine   . 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 Yates Decamp), prescribed verapamil  . Pneumonia   . PONV (postoperative nausea and vomiting)     Past Surgical History:  Procedure Laterality Date  . ABDOMINAL HYSTERECTOMY     partial  . CESAREAN SECTION    . COLONOSCOPY    . ETHMOIDECTOMY Bilateral 06/26/2014   Procedure: BILATERAL ANTERIOR ETHMOIDECTOMY;  Surgeon: Drema Halon, MD;  Location: [facility];  Service: ENT;  Laterality: Bilateral;  . MAXILLARY ANTROSTOMY Bilateral 06/26/2014   Procedure: BILATERAL MAXILLARY OSTEA  ENLARGEMENT;  Surgeon: Drema Halon, MD;  Location: [facility];  Service: ENT;  Laterality: Bilateral;  . NASAL SEPTOPLASTY W/ TURBINOPLASTY Bilateral 06/26/2014   Procedure: BILATERAL NASAL SEPTOPLASTY WITH TURBINATE REDUCTION;  Surgeon: Drema Halon, MD;  Location: [facility];  Service: ENT;  Laterality: Bilateral;  . SHOULDER ARTHROSCOPY W/ ROTATOR CUFF REPAIR  6/15   right  . TONSILLECTOMY AND ADENOIDECTOMY    . TUBAL LIGATION      Social History: Support: good family support  Social History   Tobacco Use  Smoking Status Current Every Day Smoker  . Packs/day: 0.50  . Years: 35.00  . Pack years: 17.50  . Types: Cigarettes  Smokeless Tobacco Never Used    Social History   Substance and Sexual Activity  Alcohol Use No     Allergies  Allergen Reactions  . Percocet [Oxycodone-Acetaminophen] Other (See Comments)    Sick on stomach    Medications: Asprin: yes Statin: yes Beta Blocker: yes Ace Inhibitor: no Anti-Coagulation: no  Current Facility-Administered Medications  Medication Dose Route Frequency Provider Last Rate Last Dose  . 0.9 %  sodium chloride infusion   Intravenous Continuous Patwardhan, Anabel Bene, MD 75 mL/hr at 01/08/19 1831    . 0.9 %  sodium chloride infusion  250 mL Intravenous PRN Patwardhan, Anabel Bene, MD      .  acetaminophen (TYLENOL) tablet 650 mg  650 mg Oral Q4H PRN Patwardhan, Manish J, MD      . ALPRAZolam Prudy Feeler) tablet 0.5 mg  0.5 mg Oral BID Patwardhan, Manish J, MD      . Melene Muller ON 01/09/2019] aspirin EC tablet 81 mg  81 mg Oral Daily Patwardhan, Manish J, MD      . bisacodyl (DULCOLAX) EC tablet 5 mg  5 mg Oral Once Corliss Skains, MD      . Melene Muller ON 01/09/2019] chlorhexidine (PERIDEX) 0.12 % solution 15 mL  15 mL Mouth/Throat Once Kennth Vanbenschoten, Eliezer Lofts, MD      . Chlorhexidine Gluconate Cloth 2 % PADS 6 each  6 each Topical Once Corliss Skains, MD       And  . Chlorhexidine  Gluconate Cloth 2 % PADS 6 each  6 each Topical Once Luellen Howson O, MD      . gabapentin (NEURONTIN) capsule 300 mg  300 mg Oral TID Elder Negus, MD   300 mg at 01/08/19 1828  . heparin injection 5,000 Units  5,000 Units Subcutaneous Q8H Patwardhan, Manish J, MD   5,000 Units at 01/08/19 1832  . hydrALAZINE (APRESOLINE) injection 10 mg  10 mg Intravenous Q20 Min PRN Patwardhan, Manish J, MD      . HYDROcodone-acetaminophen (NORCO/VICODIN) 5-325 MG per tablet 1 tablet  1 tablet Oral QID PRN Patwardhan, Manish J, MD      . labetalol (NORMODYNE) injection 10 mg  10 mg Intravenous Q10 min PRN Patwardhan, Manish J, MD      . Melene Muller ON 01/09/2019] metoprolol tartrate (LOPRESSOR) tablet 12.5 mg  12.5 mg Oral Once Alaija Ruble O, MD      . nicotine (NICODERM CQ - dosed in mg/24 hr) patch 7 mg  7 mg Transdermal Daily Patwardhan, Manish J, MD      . nitroGLYCERIN (NITROSTAT) SL tablet 0.4 mg  0.4 mg Sublingual Q5 Min x 3 PRN Patwardhan, Manish J, MD      . nitroGLYCERIN 50 mg in dextrose 5 % 250 mL (0.2 mg/mL) infusion  0-200 mcg/min Intravenous Titrated Patwardhan, Manish J, MD 1.5 mL/hr at 01/08/19 1733 5 mcg/min at 01/08/19 1733  . ondansetron (ZOFRAN) injection 4 mg  4 mg Intravenous Q6H PRN Patwardhan, Manish J, MD      . pantoprazole (PROTONIX) EC tablet 40 mg  40 mg Oral Daily Patwardhan, Manish J, MD   40 mg at 01/08/19 1828  . promethazine (PHENERGAN) tablet 25 mg  25 mg Oral Q6H PRN Patwardhan, Manish J, MD      . rosuvastatin (CRESTOR) tablet 40 mg  40 mg Oral Daily Patwardhan, Manish J, MD   40 mg at 01/08/19 1828  . sodium chloride flush (NS) 0.9 % injection 3 mL  3 mL Intravenous Q12H Patwardhan, Manish J, MD      . sodium chloride flush (NS) 0.9 % injection 3 mL  3 mL Intravenous PRN Patwardhan, Manish J, MD      . SUMAtriptan (IMITREX) tablet 100 mg  100 mg Oral Q2H PRN Patwardhan, Manish J, MD      . temazepam (RESTORIL) capsule 15 mg  15 mg Oral Once PRN Tiegan Terpstra,  Eliezer Lofts, MD      . verapamil (CALAN-SR) CR tablet 180 mg  180 mg Oral Daily Patwardhan, Manish J, MD        Medications Prior to Admission  Medication Sig Dispense Refill Last Dose  . ALPRAZolam (XANAX) 0.5 MG  tablet Take 0.5 mg by mouth 2 (two) times daily.    01/08/2019 at 1100  . aspirin 81 MG chewable tablet Chew 81 mg by mouth daily.   01/07/2019 at Unknown time  . cetirizine (ZYRTEC) 10 MG tablet Take 10 mg by mouth daily.   01/07/2019 at Unknown time  . Cholecalciferol (D3-1000) 25 MCG (1000 UT) capsule Take 1,000 Units by mouth daily.   01/07/2019 at Unknown time  . gabapentin (NEURONTIN) 300 MG capsule Take 1 capsule (300 mg total) by mouth 3 (three) times daily. 90 capsule 4 01/07/2019 at Unknown time  . HYDROcodone-acetaminophen (NORCO/VICODIN) 5-325 MG tablet Take 1 tablet by mouth 4 (four) times daily as needed for moderate pain.   01/08/2019 at 1000  . metoprolol tartrate (LOPRESSOR) 25 MG tablet Take 1 tablet (25 mg total) by mouth 2 (two) times daily. 60 tablet 2 01/08/2019 at 0800  . omeprazole (PRILOSEC) 40 MG capsule Take 1 capsule (40 mg total) by mouth daily. 30 capsule 5 01/07/2019 at Unknown time  . rosuvastatin (CRESTOR) 40 MG tablet Take 1 tablet (40 mg total) by mouth daily. 30 tablet 2 Past Week at Unknown time  . verapamil (CALAN-SR) 180 MG CR tablet Take 180 mg daily by mouth.   0 01/07/2019 at Unknown time  . Vitamin D, Ergocalciferol, (DRISDOL) 1.25 MG (50000 UT) CAPS capsule Take 50,000 Units by mouth every 7 (seven) days.   01/04/2019  . nitroGLYCERIN (NITROSTAT) 0.4 MG SL tablet Place 1 tablet (0.4 mg total) under the tongue every 5 (five) minutes as needed for chest pain. 90 tablet 3 Unknown at Unknown time  . promethazine (PHENERGAN) 25 MG tablet Take 25 mg every 6 (six) hours as needed by mouth for nausea or vomiting.   More than a month at Unknown time  . SUMAtriptan (IMITREX) 100 MG tablet Take 100 mg every 2 (two) hours as needed by mouth for migraine.     More than a month at Unknown time    Family History  Problem Relation Age of Onset  . Heart disease Mother        MI  . Irritable bowel syndrome Mother   . Migraines Mother   . Aneurysm Mother        brain  . Cerebral aneurysm Father   . AAA (abdominal aortic aneurysm) Sister   . Breast cancer Paternal Grandmother   . Lung cancer Paternal Grandfather   . Colon cancer Neg Hx   . Esophageal cancer Neg Hx   . Rectal cancer Neg Hx   . Stomach cancer Neg Hx      Review of Systems:   Review of Systems  Constitutional: Positive for malaise/fatigue.  HENT: Negative.   Eyes: Negative.   Respiratory: Positive for shortness of breath.   Cardiovascular: Positive for chest pain. Negative for palpitations.  Gastrointestinal: Positive for abdominal pain and nausea.  Musculoskeletal: Positive for back pain and myalgias.  Neurological: Negative.       Physical Exam: BP (!) 172/96 (BP Location: Left Arm)   Pulse 60   Temp 97.7 F (36.5 C) (Oral)   Resp 15   Ht 5\' 3"  (1.6 m)   Wt 64.4 kg   SpO2 97%   BMI 25.15 kg/m  Physical Exam  Constitutional: She is oriented to person, place, and time and well-developed, well-nourished, and in no distress. No distress.  HENT:  Head: Normocephalic and atraumatic.  Mouth/Throat: No oropharyngeal exudate.  Eyes: Conjunctivae and EOM are normal. No scleral  icterus.  Neck: Normal range of motion. No tracheal deviation present.  Cardiovascular: Normal rate and regular rhythm.  Pulmonary/Chest: Effort normal. No respiratory distress.  Abdominal: She exhibits no distension.  Musculoskeletal: Normal range of motion.  Neurological: She is alert and oriented to person, place, and time.  Skin: Skin is warm and dry. She is not diaphoretic.      Diagnostic Studies & Laboratory data:    Left Heart Catherization: Severe LM disease.  Mid LAD disease.   Echo: pending   I have independently reviewed the above radiologic studies and discussed with  the patient   Recent Lab Findings: Lab Results  Component Value Date   WBC 8.7 01/08/2019   HGB 14.7 01/08/2019   HCT 44.8 01/08/2019   PLT 219 01/08/2019   GLUCOSE 95 01/04/2019   CHOL 197 04/10/2012   TRIG 251 (H) 04/10/2012   HDL 42 04/10/2012   LDLCALC 105 (H) 04/10/2012   ALT 13 02/15/2013   AST 10 02/15/2013   NA 141 01/04/2019   K 4.5 01/04/2019   CL 103 01/04/2019   CREATININE 0.83 01/08/2019   BUN 11 01/04/2019   CO2 24 01/04/2019   HGBA1C 5.6 04/10/2012      Assessment / Plan:   58 yo female with severe LM disease.   Awaiting echo to eval heart function and valve disease Scheduled for OR in AM for CABG 2     I  spent 40 minutes counseling the patient face to face.   Lajuana Matte 01/08/2019 7:51 PM

## 2019-01-08 NOTE — Plan of Care (Signed)

## 2019-01-08 NOTE — Interval H&P Note (Signed)
History and Physical Interval Note:  01/08/2019 4:15 PM  Regina Perry  has presented today for surgery, with the diagnosis of Chest pain.  The various methods of treatment have been discussed with the patient and family. After consideration of risks, benefits and other options for treatment, the patient has consented to  Procedure(s): LEFT HEART CATH AND CORONARY ANGIOGRAPHY (N/A) as a surgical intervention.  The patient's history has been reviewed, patient examined, no change in status, stable for surgery.  I have reviewed the patient's chart and labs.  Questions were answered to the patient's satisfaction.    2016/2017 Appropriate Use Criteria for Coronary Revascularization Clinical Presentation: Diabetes Mellitus? Symptom Status? S/P CABG? Antianginal Therapy (# of long-acting drugs)? Results of Non-invasive testing? FFR/iFR results in all diseased vessels? Patient undergoing renal transplant? Patient undergoing percutaneous valve procedure (TAVR, MitraClip, Others)? Symptom Status:  Ischemic Symptoms  Non-invasive Testing:  High risk  If no or indeterminate stress test, FFR/iFR results in all diseased vessels:  N/A  Diabetes Mellitus:  No  S/P CABG:  No  Antianginal therapy (number of long-acting drugs):  >=2  Patient undergoing renal transplant:  No  Patient undergoing percutaneous valve procedure:  No    newline 1 Vessel Disease PCI CABG  No proximal LAD involvement, No proximal left dominant LCX involvement A (8); Indication 2 M (6); Indication 2   Proximal left dominant LCX involvement A (8); Indication 5 A (8); Indication 5   Proximal LAD involvement A (8); Indication 5 A (8); Indication 5   newline 2 Vessel Disease  No proximal LAD involvement A (8); Indication 8 A (7); Indication 8   Proximal LAD involvement A (8); Indication 11 A (8); Indication 11   newline 3 Vessel Disease  Low disease complexity (e.g., focal stenoses, SYNTAX <=22) A (8); Indication 17 A (8);  Indication 17   Intermediate or high disease complexity (e.g., SYNTAX >=23) M (6); Indication 21 A (9); Indication 21   newline Left Main Disease  Isolated LMCA disease: ostial or midshaft A (7); Indication 24 A (9); Indication 24   Isolated LMCA disease: bifurcation involvement M (6); Indication 25 A (9); Indication 25   LMCA ostial or midshaft, concurrent low disease burden multivessel disease (e.g., 1-2 additional focal stenoses, SYNTAX <=22) A (7); Indication 26 A (9); Indication 26   LMCA ostial or midshaft, concurrent intermediate or high disease burden multivessel disease (e.g., 1-2 additional bifurcation stenoses, long stenoses, SYNTAX >=23) M (4); Indication 27 A (9); Indication 27   LMCA bifurcation involvement, concurrent low disease burden multivessel disease (e.g., 1-2 additional focal stenoses, SYNTAX <=22) M (6); Indication 28 A (9); Indication 28   LMCA bifurcation involvement, concurrent intermediate or high disease burden multivessel disease (e.g., 1-2 additional bifurcation stenoses, long stenoses, SYNTAX >=23) R (3); Indication 29 A (9); Indication Union

## 2019-01-08 NOTE — H&P (Addendum)
Regina Perry is an 58 y.o. female.   Chief Complaint: Chest pain HPI:   58 year old Caucasian female with hypertension, hyperlipidemia, tobacco abuse, chronic back pain-on narcotics, with one episode of chest pain that awoke her from sleep few weeks ago.  Patient underwent coronary CT angiogram that was concerning for left main stenosis. Patient underwent emergent coronary angiography today confirmed mid left main 90% stenosis.  At baseline, patient walks with her dog with several stoppages in her 7 acre area.  She denies chest pain with her stop and go walking, she had an episode of chest pain that awoke her from sleep 3 weeks ago, associated with jaw and arm numbness.  That episode was self resolving.  She has not had any recurrent chest pain since then.  Patient reported 3/10 chest pain at the end of coronary angiography today, improved with IV nitroglycerin 3 mcg/min. At baseline, she is on low dose verapamil, Crestor 40 mg and Aspirin 81 mg. This is a new diagnosis and she has not had any time to be optimized on medical therapy.   Past Medical History:  Diagnosis Date  . Anxiety   . Bronchitis   . Depression   . Diverticulitis   . GERD (gastroesophageal reflux disease)   . H/O cesarean section   . History of hiatal hernia   . History of kidney stones    LEFT KIDNEY 2 STONES JUST WATCHING( [facility])  . Hypercholesteremia   . Hyperplastic colon polyp    12/30/2008  . Hypertension   . Migraine   . 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 Yates Decamp), prescribed verapamil  . Pneumonia   . PONV (postoperative nausea and vomiting)     Past Surgical History:  Procedure Laterality Date  . ABDOMINAL HYSTERECTOMY     partial  . CESAREAN SECTION    . COLONOSCOPY    . ETHMOIDECTOMY Bilateral 06/26/2014   Procedure: BILATERAL ANTERIOR ETHMOIDECTOMY;  Surgeon: Drema Halon, MD;  Location:  [facility];  Service: ENT;  Laterality: Bilateral;  . MAXILLARY ANTROSTOMY Bilateral 06/26/2014   Procedure: BILATERAL MAXILLARY OSTEA ENLARGEMENT;  Surgeon: Drema Halon, MD;  Location: [facility];  Service: ENT;  Laterality: Bilateral;  . NASAL SEPTOPLASTY W/ TURBINOPLASTY Bilateral 06/26/2014   Procedure: BILATERAL NASAL SEPTOPLASTY WITH TURBINATE REDUCTION;  Surgeon: Drema Halon, MD;  Location: [facility];  Service: ENT;  Laterality: Bilateral;  . SHOULDER ARTHROSCOPY W/ ROTATOR CUFF REPAIR  6/15   right  . TONSILLECTOMY AND ADENOIDECTOMY    . TUBAL LIGATION      Family History  Problem Relation Age of Onset  . Heart disease Mother        MI  . Irritable bowel syndrome Mother   . Migraines Mother   . Aneurysm Mother        brain  . Cerebral aneurysm Father   . AAA (abdominal aortic aneurysm) Sister   . Breast cancer Paternal Grandmother   . Lung cancer Paternal Grandfather   . Colon cancer Neg Hx   . Esophageal cancer Neg Hx   . Rectal cancer Neg Hx   . Stomach cancer Neg Hx    Social History:  reports that she has been smoking cigarettes. She has a 17.50 pack-year smoking history. She has never used smokeless tobacco. She reports that she does not drink alcohol or use drugs.  Allergies:  Allergies  Allergen Reactions  . Percocet [Oxycodone-Acetaminophen] Other (See Comments)    Sick on stomach    Review of Systems  Constitution: Negative for decreased appetite, malaise/fatigue, weight gain and weight loss.  HENT: Negative for congestion.   Eyes: Negative for visual disturbance.  Cardiovascular: Positive for chest pain. Negative for dyspnea on exertion, leg swelling, palpitations and syncope.  Respiratory: Negative for cough.   Endocrine: Negative for cold intolerance.  Hematologic/Lymphatic: Does not bruise/bleed easily.  Skin: Negative for itching and rash.  Musculoskeletal: Negative for myalgias.   Gastrointestinal: Negative for abdominal pain, nausea and vomiting.  Genitourinary: Negative for dysuria.  Neurological: Negative for dizziness and weakness.  Psychiatric/Behavioral: The patient is not nervous/anxious.   All other systems reviewed and are negative.    Blood pressure (!) 147/89, pulse (!) 59, temperature (!) 97.3 F (36.3 C), temperature source Skin, resp. rate 15, height 5\' 3"  (1.6 m), weight 64.4 kg, SpO2 100 %. Body mass index is 25.15 kg/m.  Physical Exam  Constitutional: She is oriented to person, place, and time. She appears well-developed and well-nourished. No distress.  HENT:  Head: Normocephalic and atraumatic.  Eyes: Pupils are equal, round, and reactive to light. Conjunctivae are normal.  Neck: No JVD present.  Cardiovascular: Normal rate, regular rhythm and intact distal pulses.  No murmur heard. Pulmonary/Chest: Effort normal and breath sounds normal. She has no wheezes. She has no rales.  Abdominal: Soft. Bowel sounds are normal. There is no rebound.  Musculoskeletal:        General: No edema.  Lymphadenopathy:    She has no cervical adenopathy.  Neurological: She is alert and oriented to person, place, and time. No cranial nerve deficit.  Skin: Skin is warm and dry.  Psychiatric: She has a normal mood and affect.  Nursing note and vitals reviewed.   Labs:   Lab Results  Component Value Date   WBC 8.3 01/04/2019   HGB 14.6 01/04/2019   HCT 43.9 01/04/2019   MCV 85 01/04/2019   PLT 214 01/04/2019    Recent Labs  Lab 01/04/19 1319  NA 141  K 4.5  CL 103  CO2 24  BUN 11  CREATININE 0.85  CALCIUM 9.2  GLUCOSE 95    Lipid Panel     Component Value Date/Time   CHOL 197 04/10/2012 0414   TRIG 251 (H) 04/10/2012 0414   HDL 42 04/10/2012 0414   CHOLHDL 4.7 04/10/2012 0414   VLDL 50 (H) 04/10/2012 0414   LDLCALC 105 (H) 04/10/2012 0414    BNP (last 3 results) No results for input(s): BNP in the last 8760 hours.  HEMOGLOBIN  A1C Lab Results  Component Value Date   HGBA1C 5.6 04/10/2012   MPG 114 04/10/2012     Medications Prior to Admission  Medication Sig Dispense Refill  . ALPRAZolam (XANAX) 0.5 MG tablet Take 0.5 mg by mouth 2 (two) times daily.     Marland Kitchen. aspirin 81 MG chewable tablet Chew 81 mg by mouth daily.    . cetirizine (ZYRTEC) 10 MG tablet Take 10 mg by mouth daily.    . Cholecalciferol (D3-1000) 25 MCG (1000 UT) capsule Take 1,000 Units by mouth daily.    Marland Kitchen. gabapentin (NEURONTIN) 300 MG capsule Take 1 capsule (300 mg total) by mouth 3 (three) times daily. 90 capsule 4  . HYDROcodone-acetaminophen (NORCO/VICODIN) 5-325 MG tablet Take 1 tablet by mouth 4 (four) times daily as needed for moderate pain.    .Marland Kitchen  metoprolol tartrate (LOPRESSOR) 25 MG tablet Take 1 tablet (25 mg total) by mouth 2 (two) times daily. 60 tablet 2  . omeprazole (PRILOSEC) 40 MG capsule Take 1 capsule (40 mg total) by mouth daily. 30 capsule 5  . rosuvastatin (CRESTOR) 40 MG tablet Take 1 tablet (40 mg total) by mouth daily. 30 tablet 2  . verapamil (CALAN-SR) 180 MG CR tablet Take 180 mg daily by mouth.   0  . Vitamin D, Ergocalciferol, (DRISDOL) 1.25 MG (50000 UT) CAPS capsule Take 50,000 Units by mouth every 7 (seven) days.    . nitroGLYCERIN (NITROSTAT) 0.4 MG SL tablet Place 1 tablet (0.4 mg total) under the tongue every 5 (five) minutes as needed for chest pain. 90 tablet 3  . promethazine (PHENERGAN) 25 MG tablet Take 25 mg every 6 (six) hours as needed by mouth for nausea or vomiting.    . SUMAtriptan (IMITREX) 100 MG tablet Take 100 mg every 2 (two) hours as needed by mouth for migraine.         Current Facility-Administered Medications:  .  acetaminophen (TYLENOL) tablet 650 mg, 650 mg, Oral, Q4H PRN, Aylissa Heinemann J, MD .  aspirin chewable tablet 324 mg, 324 mg, Oral, NOW **OR** aspirin suppository 300 mg, 300 mg, Rectal, NOW, Maleyah Evans J, MD .  Derrill Memo ON 01/09/2019] aspirin EC tablet 81 mg, 81 mg, Oral,  Daily, Joaquin Knebel J, MD .  heparin injection 5,000 Units, 5,000 Units, Subcutaneous, Q8H, Izzabelle Bouley J, MD .  nitroGLYCERIN (NITROSTAT) SL tablet 0.4 mg, 0.4 mg, Sublingual, Q5 Min x 3 PRN, Sadhana Frater J, MD .  nitroGLYCERIN 50 mg in dextrose 5 % 250 mL (0.2 mg/mL) infusion, 0-200 mcg/min, Intravenous, Titrated, Kenyen Candy J, MD, Last Rate: 1.5 mL/hr at 01/08/19 1733, 5 mcg/min at 01/08/19 1733 .  ondansetron (ZOFRAN) injection 4 mg, 4 mg, Intravenous, Q6H PRN, Cumi Sanagustin J, MD   Today's Vitals   01/08/19 1703 01/08/19 1708 01/08/19 1713 01/08/19 1717  BP: (!) 147/88 (!) 157/92 (!) 147/89   Pulse: 64 68 (!) 59   Resp: (!) 34 12 15   Temp:      TempSrc:      SpO2: 100% 100% 100%   Weight:      Height:      PainSc:    3    Body mass index is 25.15 kg/m.   CARDIAC STUDIES:  Coronary angiography 01/08/2019: LM: Mid calcified 90% stenosis. LAD: Mid LAD focal calfied 60% stenosis after D2 LCx: Normal RCA: Ostial calcification without significant disease  LVEF normal LVEDP mildly elevated at 21 mmHg   EKG 12/13/2018:  Sinus rhythm 61 bpm. Borderline left atrial enlargement. Nonspecific ST-T changes  Echocardiogram pending:    Assessment/Plan  58 year old Caucasian female with hypertension, hyperlipidemia, tobacco abuse, chronic back pain-on narcotics, now with unstable angina and severe left main stenosis.  Unstable angina: 3/10 chest pain at the end of diagnostic angiogram, improved with IV NTG. Normal EF on LV gram with LVEDP 21 mmHg. Will admit to stepdown unit on IV NTG with CVTS consult for CABG. Continue Aspirin 81 mg/Crestor 40 mg.  Nigel Mormon, MD 01/08/2019, 5:44 PM [facility] Pager: [phone] Office: [phone] If no answer: [phone]

## 2019-01-09 ENCOUNTER — Inpatient Hospital Stay (HOSPITAL_COMMUNITY): Payer: Medicare HMO

## 2019-01-09 ENCOUNTER — Inpatient Hospital Stay (HOSPITAL_COMMUNITY): Payer: Medicare HMO | Admitting: Anesthesiology

## 2019-01-09 ENCOUNTER — Encounter (HOSPITAL_COMMUNITY)
Admission: AD | Disposition: A | Discharge status: Home Health | Payer: Self-pay | Source: Home / Self Care | Attending: Thoracic Surgery (Cardiothoracic Vascular Surgery)

## 2019-01-09 ENCOUNTER — Other Ambulatory Visit: Payer: Self-pay

## 2019-01-09 ENCOUNTER — Encounter (HOSPITAL_COMMUNITY): Payer: Self-pay | Admitting: Cardiology

## 2019-01-09 DIAGNOSIS — Z951 Presence of aortocoronary bypass graft: Secondary | ICD-10-CM

## 2019-01-09 DIAGNOSIS — Z01810 Encounter for preprocedural cardiovascular examination: Secondary | ICD-10-CM

## 2019-01-09 DIAGNOSIS — I25110 Atherosclerotic heart disease of native coronary artery with unstable angina pectoris: Secondary | ICD-10-CM

## 2019-01-09 DIAGNOSIS — I25118 Atherosclerotic heart disease of native coronary artery with other forms of angina pectoris: Secondary | ICD-10-CM

## 2019-01-09 HISTORY — PX: CORONARY ARTERY BYPASS GRAFT: SHX141

## 2019-01-09 HISTORY — PX: TEE WITHOUT CARDIOVERSION: SHX5443

## 2019-01-09 HISTORY — DX: Presence of aortocoronary bypass graft: Z95.1

## 2019-01-09 LAB — CBC
HCT: 41.2 % (ref 36.0–46.0)
HCT: 30.8 % — ABNORMAL LOW (ref 36.0–46.0)
HCT: 29.5 % — ABNORMAL LOW (ref 36.0–46.0)
Hemoglobin: 13.6 g/dL (ref 12.0–15.0)
Hemoglobin: 10.3 g/dL — ABNORMAL LOW (ref 12.0–15.0)
Hemoglobin: 10.1 g/dL — ABNORMAL LOW (ref 12.0–15.0)
MCH: 29.5 pg (ref 26.0–34.0)
MCH: 30.1 pg (ref 26.0–34.0)
MCH: 30.6 pg (ref 26.0–34.0)
MCHC: 33 g/dL (ref 30.0–36.0)
MCHC: 33.4 g/dL (ref 30.0–36.0)
MCHC: 34.2 g/dL (ref 30.0–36.0)
MCV: 89.4 fL (ref 80.0–100.0)
MCV: 90.1 fL (ref 80.0–100.0)
MCV: 89.4 fL (ref 80.0–100.0)
Platelets: 209 10*3/uL (ref 150–400)
Platelets: 150 10*3/uL (ref 150–400)
Platelets: 159 10*3/uL (ref 150–400)
RBC: 4.61 MIL/uL (ref 3.87–5.11)
RBC: 3.42 MIL/uL — ABNORMAL LOW (ref 3.87–5.11)
RBC: 3.3 MIL/uL — ABNORMAL LOW (ref 3.87–5.11)
RDW: 12.7 % (ref 11.5–15.5)
RDW: 12.7 % (ref 11.5–15.5)
RDW: 12.6 % (ref 11.5–15.5)
WBC: 6.9 10*3/uL (ref 4.0–10.5)
WBC: 11.5 10*3/uL — ABNORMAL HIGH (ref 4.0–10.5)
WBC: 13.6 10*3/uL — ABNORMAL HIGH (ref 4.0–10.5)
nRBC: 0 % (ref 0.0–0.2)
nRBC: 0 % (ref 0.0–0.2)
nRBC: 0 % (ref 0.0–0.2)

## 2019-01-09 LAB — POCT I-STAT 7, (LYTES, BLD GAS, ICA,H+H)
Acid-base deficit: 3 mmol/L — ABNORMAL HIGH (ref 0.0–2.0)
Bicarbonate: 24.2 mmol/L (ref 20.0–28.0)
Calcium, Ion: 1.08 mmol/L — ABNORMAL LOW (ref 1.15–1.40)
HCT: 30 % — ABNORMAL LOW (ref 36.0–46.0)
Hemoglobin: 10.2 g/dL — ABNORMAL LOW (ref 12.0–15.0)
O2 Saturation: 92 %
Patient temperature: 35
Potassium: 4 mmol/L (ref 3.5–5.1)
Sodium: 143 mmol/L (ref 135–145)
TCO2: 26 mmol/L (ref 22–32)
pCO2 arterial: 47.7 mmHg (ref 32.0–48.0)
pH, Arterial: 7.303 — ABNORMAL LOW (ref 7.350–7.450)
pO2, Arterial: 64 mmHg — ABNORMAL LOW (ref 83.0–108.0)

## 2019-01-09 LAB — PROTIME-INR
INR: 1.4 — ABNORMAL HIGH (ref 0.8–1.2)
Prothrombin Time: 16.8 seconds — ABNORMAL HIGH (ref 11.4–15.2)

## 2019-01-09 LAB — GLUCOSE, CAPILLARY
Glucose-Capillary: 114 mg/dL — ABNORMAL HIGH (ref 70–99)
Glucose-Capillary: 122 mg/dL — ABNORMAL HIGH (ref 70–99)
Glucose-Capillary: 121 mg/dL — ABNORMAL HIGH (ref 70–99)
Glucose-Capillary: 117 mg/dL — ABNORMAL HIGH (ref 70–99)
Glucose-Capillary: 136 mg/dL — ABNORMAL HIGH (ref 70–99)
Glucose-Capillary: 141 mg/dL — ABNORMAL HIGH (ref 70–99)

## 2019-01-09 LAB — PLATELET COUNT: Platelets: 148 10*3/uL — ABNORMAL LOW (ref 150–400)

## 2019-01-09 LAB — POCT I-STAT, CHEM 8
BUN: 8 mg/dL (ref 6–20)
Calcium, Ion: 1.12 mmol/L — ABNORMAL LOW (ref 1.15–1.40)
Chloride: 111 mmol/L (ref 98–111)
Creatinine, Ser: 0.6 mg/dL (ref 0.44–1.00)
Glucose, Bld: 131 mg/dL — ABNORMAL HIGH (ref 70–99)
HCT: 26 % — ABNORMAL LOW (ref 36.0–46.0)
Hemoglobin: 8.8 g/dL — ABNORMAL LOW (ref 12.0–15.0)
Potassium: 3.7 mmol/L (ref 3.5–5.1)
Sodium: 142 mmol/L (ref 135–145)
TCO2: 22 mmol/L (ref 22–32)

## 2019-01-09 LAB — BASIC METABOLIC PANEL
Anion gap: 10 (ref 5–15)
BUN: 10 mg/dL (ref 6–20)
CO2: 23 mmol/L (ref 22–32)
Calcium: 8.8 mg/dL — ABNORMAL LOW (ref 8.9–10.3)
Chloride: 107 mmol/L (ref 98–111)
Creatinine, Ser: 0.74 mg/dL (ref 0.44–1.00)
GFR calc Af Amer: >60 mL/min (ref >60)
GFR calc non Af Amer: >60 mL/min (ref >60)
Glucose, Bld: 105 mg/dL — ABNORMAL HIGH (ref 70–99)
Potassium: 4.1 mmol/L (ref 3.5–5.1)
Sodium: 140 mmol/L (ref 135–145)

## 2019-01-09 LAB — ECHOCARDIOGRAM COMPLETE
Height: 63 in
Weight: 2219.2 oz

## 2019-01-09 LAB — APTT: aPTT: 33 seconds (ref 24–36)

## 2019-01-09 LAB — SURGICAL PCR SCREEN
MRSA, PCR: NEGATIVE
Staphylococcus aureus: NEGATIVE

## 2019-01-09 LAB — HEMOGLOBIN AND HEMATOCRIT, BLOOD
HCT: 23.8 % — ABNORMAL LOW (ref 36.0–46.0)
Hemoglobin: 8.2 g/dL — ABNORMAL LOW (ref 12.0–15.0)

## 2019-01-09 SURGERY — CORONARY ARTERY BYPASS GRAFTING (CABG)
Anesthesia: General | Site: Chest

## 2019-01-09 MED ORDER — MIDAZOLAM HCL 5 MG/5ML IJ SOLN
INTRAMUSCULAR | Status: DC | PRN
  Administered 2019-01-09: 2 mg via INTRAVENOUS
  Administered 2019-01-09: 4 mg via INTRAVENOUS
  Administered 2019-01-09: 2 mg via INTRAVENOUS
  Administered 2019-01-09 (×2): 1 mg via INTRAVENOUS

## 2019-01-09 MED ORDER — SODIUM CHLORIDE 0.9% FLUSH
3.0000 mL | Freq: Two times a day (BID) | INTRAVENOUS | Status: DC
  Administered 2019-01-10 – 2019-01-16 (×12): 3 mL via INTRAVENOUS

## 2019-01-09 MED ORDER — FENTANYL CITRATE (PF) 250 MCG/5ML IJ SOLN
INTRAMUSCULAR | Status: AC
  Filled 2019-01-09: qty 10

## 2019-01-09 MED ORDER — PROPOFOL 10 MG/ML IV BOLUS
INTRAVENOUS | Status: AC
  Filled 2019-01-09: qty 20

## 2019-01-09 MED ORDER — INSULIN REGULAR(HUMAN) IN NACL 100-0.9 UT/100ML-% IV SOLN
INTRAVENOUS | Status: DC | PRN
  Administered 2019-01-09: 1 [IU]/h via INTRAVENOUS

## 2019-01-09 MED ORDER — HEPARIN SODIUM (PORCINE) 1000 UNIT/ML IJ SOLN
INTRAMUSCULAR | Status: DC | PRN
  Administered 2019-01-09: 22000 [IU] via INTRAVENOUS

## 2019-01-09 MED ORDER — DEXMEDETOMIDINE HCL IN NACL 400 MCG/100ML IV SOLN: 0.0000 ug/kg/h | INTRAVENOUS | Status: DC

## 2019-01-09 MED ORDER — DOBUTAMINE IN D5W 4-5 MG/ML-% IV SOLN: 2.5000 ug/kg/min | INTRAVENOUS | Status: DC

## 2019-01-09 MED ORDER — SODIUM CHLORIDE (PF) 0.9 % IJ SOLN
OROMUCOSAL | Status: DC | PRN
  Administered 2019-01-09 (×3): 4 mL via TOPICAL

## 2019-01-09 MED ORDER — ROSUVASTATIN CALCIUM 20 MG PO TABS
40.0000 mg | ORAL_TABLET | Freq: Every day | ORAL | Status: DC
  Administered 2019-01-10 – 2019-01-16 (×7): 40 mg via ORAL
  Filled 2019-01-09 (×7): qty 2

## 2019-01-09 MED ORDER — PROTAMINE SULFATE 10 MG/ML IV SOLN
INTRAVENOUS | Status: DC | PRN
  Administered 2019-01-09 (×2): 40 mg via INTRAVENOUS
  Administered 2019-01-09 (×2): 30 mg via INTRAVENOUS
  Administered 2019-01-09: 50 mg via INTRAVENOUS
  Administered 2019-01-09: 30 mg via INTRAVENOUS

## 2019-01-09 MED ORDER — PHENYLEPHRINE HCL-NACL 20-0.9 MG/250ML-% IV SOLN: 0.0000 ug/min | INTRAVENOUS | Status: DC

## 2019-01-09 MED ORDER — LACTATED RINGERS IV SOLN
INTRAVENOUS | Status: DC | PRN
  Administered 2019-01-09 (×2): via INTRAVENOUS

## 2019-01-09 MED ORDER — ACETAMINOPHEN 500 MG PO TABS
1000.0000 mg | ORAL_TABLET | Freq: Four times a day (QID) | ORAL | Status: AC
  Administered 2019-01-10 – 2019-01-14 (×13): 1000 mg via ORAL
  Filled 2019-01-09 (×14): qty 2

## 2019-01-09 MED ORDER — NOREPINEPHRINE 4 MG/250ML-% IV SOLN: 0.0000 ug/min | INTRAVENOUS | Status: DC

## 2019-01-09 MED ORDER — CHLORHEXIDINE GLUCONATE 0.12 % MT SOLN: 15.0000 mL | OROMUCOSAL | Status: AC

## 2019-01-09 MED ORDER — PHENYLEPHRINE HCL-NACL 10-0.9 MG/250ML-% IV SOLN: 0.0000 ug/min | INTRAVENOUS | Status: DC

## 2019-01-09 MED ORDER — SODIUM CHLORIDE 0.9 % IV SOLN
INTRAVENOUS | Status: DC | PRN
  Administered 2019-01-09: 1.5 g via INTRAVENOUS

## 2019-01-09 MED ORDER — CHLORHEXIDINE GLUCONATE CLOTH 2 % EX PADS
6.0000 | MEDICATED_PAD | Freq: Every day | CUTANEOUS | Status: DC
  Administered 2019-01-11 – 2019-01-13 (×3): 6 via TOPICAL

## 2019-01-09 MED ORDER — HEPARIN SODIUM (PORCINE) 1000 UNIT/ML IJ SOLN
INTRAMUSCULAR | Status: AC
  Filled 2019-01-09: qty 1

## 2019-01-09 MED ORDER — SODIUM CHLORIDE 0.9% FLUSH
3.0000 mL | INTRAVENOUS | Status: DC | PRN
  Administered 2019-01-15: 3 mL via INTRAVENOUS
  Filled 2019-01-09: qty 3

## 2019-01-09 MED ORDER — METOPROLOL TARTRATE 25 MG/10 ML ORAL SUSPENSION
12.5000 mg | Freq: Two times a day (BID) | ORAL | Status: DC
  Filled 2019-01-09 (×5): qty 5

## 2019-01-09 MED ORDER — HEMOSTATIC AGENTS (NO CHARGE) OPTIME
TOPICAL | Status: DC | PRN
  Administered 2019-01-09: 1 via TOPICAL

## 2019-01-09 MED ORDER — ASPIRIN EC 325 MG PO TBEC
325.0000 mg | DELAYED_RELEASE_TABLET | Freq: Every day | ORAL | Status: DC
  Administered 2019-01-10 – 2019-01-16 (×7): 325 mg via ORAL
  Filled 2019-01-09 (×7): qty 1

## 2019-01-09 MED ORDER — FENTANYL CITRATE (PF) 250 MCG/5ML IJ SOLN
INTRAMUSCULAR | Status: DC | PRN
  Administered 2019-01-09: 250 ug via INTRAVENOUS
  Administered 2019-01-09: 75 ug via INTRAVENOUS
  Administered 2019-01-09 (×2): 250 ug via INTRAVENOUS
  Administered 2019-01-09: 150 ug via INTRAVENOUS
  Administered 2019-01-09: 250 ug via INTRAVENOUS
  Administered 2019-01-09: 25 ug via INTRAVENOUS

## 2019-01-09 MED ORDER — LACTATED RINGERS IV SOLN
INTRAVENOUS | Status: DC | PRN
  Administered 2019-01-09 (×2): via INTRAVENOUS

## 2019-01-09 MED ORDER — LACTATED RINGERS IV SOLN
INTRAVENOUS | Status: DC | PRN
  Administered 2019-01-09 (×2): via INTRAVENOUS

## 2019-01-09 MED ORDER — INSULIN REGULAR BOLUS VIA INFUSION
0.0000 [IU] | Freq: Three times a day (TID) | INTRAVENOUS | Status: DC
  Filled 2019-01-09: qty 10

## 2019-01-09 MED ORDER — ALBUMIN HUMAN 5 % IV SOLN
INTRAVENOUS | Status: DC | PRN
  Administered 2019-01-09 (×2): via INTRAVENOUS

## 2019-01-09 MED ORDER — METOPROLOL TARTRATE 12.5 MG HALF TABLET
12.5000 mg | ORAL_TABLET | Freq: Two times a day (BID) | ORAL | Status: DC
  Administered 2019-01-10 – 2019-01-16 (×11): 12.5 mg via ORAL
  Filled 2019-01-09 (×13): qty 1

## 2019-01-09 MED ORDER — MIDAZOLAM HCL 2 MG/2ML IJ SOLN: 2.0000 mg | INTRAMUSCULAR | Status: DC | PRN

## 2019-01-09 MED ORDER — BISACODYL 5 MG PO TBEC
10.0000 mg | DELAYED_RELEASE_TABLET | Freq: Every day | ORAL | Status: DC
  Administered 2019-01-10 – 2019-01-15 (×4): 10 mg via ORAL
  Filled 2019-01-09 (×5): qty 2

## 2019-01-09 MED ORDER — MAGNESIUM SULFATE 4 GM/100ML IV SOLN
4.0000 g | Freq: Once | INTRAVENOUS | Status: AC
  Administered 2019-01-09: 4 g via INTRAVENOUS
  Filled 2019-01-09: qty 100

## 2019-01-09 MED ORDER — ONDANSETRON HCL 4 MG/2ML IJ SOLN
4.0000 mg | Freq: Four times a day (QID) | INTRAMUSCULAR | Status: DC | PRN
  Administered 2019-01-10 – 2019-01-15 (×6): 4 mg via INTRAVENOUS
  Filled 2019-01-09 (×6): qty 2

## 2019-01-09 MED ORDER — ROCURONIUM BROMIDE 10 MG/ML (PF) SYRINGE
PREFILLED_SYRINGE | INTRAVENOUS | Status: AC
  Filled 2019-01-09: qty 30

## 2019-01-09 MED ORDER — PROPOFOL 10 MG/ML IV BOLUS
INTRAVENOUS | Status: DC | PRN
  Administered 2019-01-09: 50 mg via INTRAVENOUS
  Administered 2019-01-09: 40 mg via INTRAVENOUS

## 2019-01-09 MED ORDER — DEXMEDETOMIDINE HCL IN NACL 400 MCG/100ML IV SOLN
INTRAVENOUS | Status: DC | PRN
  Administered 2019-01-09: .2 ug/kg/h via INTRAVENOUS

## 2019-01-09 MED ORDER — PROTAMINE SULFATE 10 MG/ML IV SOLN
INTRAVENOUS | Status: AC
  Filled 2019-01-09: qty 25

## 2019-01-09 MED ORDER — DOCUSATE SODIUM 100 MG PO CAPS
200.0000 mg | ORAL_CAPSULE | Freq: Every day | ORAL | Status: DC
  Administered 2019-01-10 – 2019-01-15 (×4): 200 mg via ORAL
  Filled 2019-01-09 (×5): qty 2

## 2019-01-09 MED ORDER — BISACODYL 10 MG RE SUPP: 10.0000 mg | Freq: Every day | RECTAL | Status: DC

## 2019-01-09 MED ORDER — SODIUM CHLORIDE 0.45 % IV SOLN
INTRAVENOUS | Status: DC | PRN
  Administered 2019-01-09: 18:00:00 via INTRAVENOUS

## 2019-01-09 MED ORDER — FENTANYL CITRATE (PF) 250 MCG/5ML IJ SOLN
INTRAMUSCULAR | Status: AC
  Filled 2019-01-09: qty 5

## 2019-01-09 MED ORDER — ALBUMIN HUMAN 5 % IV SOLN
250.0000 mL | INTRAVENOUS | Status: AC | PRN
  Administered 2019-01-09 (×2): 12.5 g via INTRAVENOUS
  Filled 2019-01-09: qty 250

## 2019-01-09 MED ORDER — VANCOMYCIN HCL 1000 MG IV SOLR
INTRAVENOUS | Status: DC | PRN
  Administered 2019-01-09: 1250 mg via INTRAVENOUS

## 2019-01-09 MED ORDER — SODIUM CHLORIDE 0.9 % IV SOLN: INTRAVENOUS | Status: DC

## 2019-01-09 MED ORDER — FENTANYL CITRATE (PF) 100 MCG/2ML IJ SOLN
INTRAMUSCULAR | Status: AC
  Filled 2019-01-09: qty 2

## 2019-01-09 MED ORDER — METOPROLOL TARTRATE 5 MG/5ML IV SOLN: 2.5000 mg | INTRAVENOUS | Status: DC | PRN

## 2019-01-09 MED ORDER — GABAPENTIN 300 MG PO CAPS
300.0000 mg | ORAL_CAPSULE | Freq: Three times a day (TID) | ORAL | Status: DC
  Administered 2019-01-10 – 2019-01-16 (×18): 300 mg via ORAL
  Filled 2019-01-09 (×18): qty 1

## 2019-01-09 MED ORDER — DEXMEDETOMIDINE HCL IN NACL 200 MCG/50ML IV SOLN
INTRAVENOUS | Status: AC
  Filled 2019-01-09: qty 50

## 2019-01-09 MED ORDER — SODIUM CHLORIDE 0.9 % IV SOLN
INTRAVENOUS | Status: DC | PRN
  Administered 2019-01-09: 20 ug/min via INTRAVENOUS

## 2019-01-09 MED ORDER — SODIUM CHLORIDE 0.9 % IV SOLN
1.5000 g | Freq: Two times a day (BID) | INTRAVENOUS | Status: AC
  Administered 2019-01-09 – 2019-01-11 (×4): 1.5 g via INTRAVENOUS
  Filled 2019-01-09 (×4): qty 1.5

## 2019-01-09 MED ORDER — TRANEXAMIC ACID 1000 MG/10ML IV SOLN
INTRAVENOUS | Status: DC | PRN
  Administered 2019-01-09: 1.5 mg/kg/h via INTRAVENOUS

## 2019-01-09 MED ORDER — VANCOMYCIN HCL IN DEXTROSE 1-5 GM/200ML-% IV SOLN
1000.0000 mg | Freq: Once | INTRAVENOUS | Status: AC
  Administered 2019-01-09: 1000 mg via INTRAVENOUS
  Filled 2019-01-09: qty 200

## 2019-01-09 MED ORDER — FAMOTIDINE IN NACL 20-0.9 MG/50ML-% IV SOLN
20.0000 mg | Freq: Two times a day (BID) | INTRAVENOUS | Status: AC
  Administered 2019-01-09 (×2): 20 mg via INTRAVENOUS
  Filled 2019-01-09: qty 50

## 2019-01-09 MED ORDER — LACTATED RINGERS IV SOLN: INTRAVENOUS | Status: DC

## 2019-01-09 MED ORDER — MIDAZOLAM HCL (PF) 10 MG/2ML IJ SOLN
INTRAMUSCULAR | Status: AC
  Filled 2019-01-09: qty 2

## 2019-01-09 MED ORDER — NICARDIPINE HCL IN NACL 20-0.86 MG/200ML-% IV SOLN
3.0000 mg/h | INTRAVENOUS | Status: DC
  Filled 2019-01-09: qty 200

## 2019-01-09 MED ORDER — PANTOPRAZOLE SODIUM 40 MG PO TBEC: 40.0000 mg | DELAYED_RELEASE_TABLET | Freq: Every day | ORAL | Status: DC

## 2019-01-09 MED ORDER — ASPIRIN 81 MG PO CHEW: 324.0000 mg | CHEWABLE_TABLET | Freq: Every day | ORAL | Status: DC

## 2019-01-09 MED ORDER — 0.9 % SODIUM CHLORIDE (POUR BTL) OPTIME
TOPICAL | Status: DC | PRN
  Administered 2019-01-09: 5000 mL

## 2019-01-09 MED ORDER — LIDOCAINE 2% (20 MG/ML) 5 ML SYRINGE
INTRAMUSCULAR | Status: DC | PRN
  Administered 2019-01-09: 60 mg via INTRAVENOUS

## 2019-01-09 MED ORDER — ACETAMINOPHEN 160 MG/5ML PO SOLN: 1000.0000 mg | Freq: Four times a day (QID) | ORAL | Status: AC

## 2019-01-09 MED ORDER — INSULIN REGULAR(HUMAN) IN NACL 100-0.9 UT/100ML-% IV SOLN: INTRAVENOUS | Status: DC

## 2019-01-09 MED ORDER — PLASMA-LYTE 148 IV SOLN
INTRAVENOUS | Status: DC | PRN
  Administered 2019-01-09: 500 mL via INTRAVASCULAR

## 2019-01-09 MED ORDER — LACTATED RINGERS IV SOLN: 500.0000 mL | Freq: Once | INTRAVENOUS | Status: DC | PRN

## 2019-01-09 MED ORDER — MIDAZOLAM HCL 2 MG/2ML IJ SOLN
INTRAMUSCULAR | Status: AC
  Filled 2019-01-09: qty 2

## 2019-01-09 MED ORDER — SODIUM CHLORIDE 0.9 % IV SOLN: 250.0000 mL | INTRAVENOUS | Status: DC

## 2019-01-09 MED ORDER — POTASSIUM CHLORIDE 10 MEQ/50ML IV SOLN: 10.0000 meq | INTRAVENOUS | Status: AC

## 2019-01-09 MED ORDER — SODIUM CHLORIDE 0.9 % IV SOLN
INTRAVENOUS | Status: DC | PRN
  Administered 2019-01-09: 750 mg via INTRAVENOUS

## 2019-01-09 MED ORDER — TRAMADOL HCL 50 MG PO TABS
50.0000 mg | ORAL_TABLET | ORAL | Status: DC | PRN
  Administered 2019-01-10 (×2): 100 mg via ORAL
  Filled 2019-01-09 (×2): qty 2

## 2019-01-09 MED ORDER — ROCURONIUM BROMIDE 10 MG/ML (PF) SYRINGE
PREFILLED_SYRINGE | INTRAVENOUS | Status: DC | PRN
  Administered 2019-01-09 (×2): 100 mg via INTRAVENOUS

## 2019-01-09 SURGICAL SUPPLY — 102 items
BAG DECANTER FOR FLEXI CONT (MISCELLANEOUS) ×4 IMPLANT
BASKET HEART  (ORDER IN 25'S) (MISCELLANEOUS) ×1
BASKET HEART (ORDER IN 25'S) (MISCELLANEOUS) ×1
BASKET HEART (ORDER IN 25S) (MISCELLANEOUS) ×2 IMPLANT
BLADE CLIPPER SURG (BLADE) IMPLANT
BLADE STERNUM SYSTEM 6 (BLADE) ×4 IMPLANT
BLADE SURG 11 STRL SS (BLADE) ×4 IMPLANT
BNDG ELASTIC 4X5.8 VLCR STR LF (GAUZE/BANDAGES/DRESSINGS) ×4 IMPLANT
BNDG ELASTIC 6X5.8 VLCR STR LF (GAUZE/BANDAGES/DRESSINGS) ×4 IMPLANT
BNDG GAUZE ELAST 4 BULKY (GAUZE/BANDAGES/DRESSINGS) ×4 IMPLANT
CANISTER SUCT 3000ML PPV (MISCELLANEOUS) ×4 IMPLANT
CANNULA EZ GLIDE 8.0 24FR (CANNULA) ×4 IMPLANT
CANNULA MC2 2 STG 29/37 NON-V (CANNULA) ×2 IMPLANT
CANNULA MC2 TWO STAGE (CANNULA) ×2
CATH CPB KIT HENDRICKSON (MISCELLANEOUS) ×4 IMPLANT
CLIP RETRACTION 3.0MM CORONARY (MISCELLANEOUS) ×4 IMPLANT
CLIP VESOCCLUDE MED 24/CT (CLIP) IMPLANT
CLIP VESOCCLUDE SM WIDE 24/CT (CLIP) IMPLANT
CONN ST 1/2X1/2  BEN (MISCELLANEOUS) ×2
CONN ST 1/2X1/2 BEN (MISCELLANEOUS) ×2 IMPLANT
CONNECTOR BLAKE 2:1 CARIO BLK (MISCELLANEOUS) ×4 IMPLANT
COVER WAND RF STERILE (DRAPES) IMPLANT
DRAIN CHANNEL 19F RND (DRAIN) ×8 IMPLANT
DRAPE CARDIOVASCULAR INCISE (DRAPES) ×2
DRAPE INCISE IOBAN 66X45 STRL (DRAPES) ×4 IMPLANT
DRAPE SLUSH/WARMER DISC (DRAPES) ×4 IMPLANT
DRAPE SRG 135X102X78XABS (DRAPES) ×2 IMPLANT
DRSG AQUACEL AG ADV 3.5X10 (GAUZE/BANDAGES/DRESSINGS) ×8 IMPLANT
DRSG COVADERM 4X14 (GAUZE/BANDAGES/DRESSINGS) ×4 IMPLANT
ELECT REM PT RETURN 9FT ADLT (ELECTROSURGICAL) ×8
ELECTRODE REM PT RTRN 9FT ADLT (ELECTROSURGICAL) ×4 IMPLANT
EVACUATOR SILICONE 100CC (DRAIN) ×8 IMPLANT
FELT TEFLON 1X6 (MISCELLANEOUS) ×8 IMPLANT
GAUZE SPONGE 4X4 12PLY STRL (GAUZE/BANDAGES/DRESSINGS) ×8 IMPLANT
GLOVE BIO SURGEON STRL SZ 6.5 (GLOVE) ×18 IMPLANT
GLOVE BIO SURGEON STRL SZ7 (GLOVE) ×24 IMPLANT
GLOVE BIO SURGEON STRL SZ8.5 (GLOVE) ×4 IMPLANT
GLOVE BIO SURGEONS STRL SZ 6.5 (GLOVE) ×6
GLOVE BIOGEL PI IND STRL 6.5 (GLOVE) ×6 IMPLANT
GLOVE BIOGEL PI IND STRL 7.0 (GLOVE) ×2 IMPLANT
GLOVE BIOGEL PI IND STRL 7.5 (GLOVE) IMPLANT
GLOVE BIOGEL PI INDICATOR 6.5 (GLOVE) ×6
GLOVE BIOGEL PI INDICATOR 7.0 (GLOVE) ×2
GLOVE BIOGEL PI INDICATOR 7.5 (GLOVE)
GLOVE SURG SS PI 6.0 STRL IVOR (GLOVE) ×4 IMPLANT
GOWN STRL REUS W/ TWL LRG LVL3 (GOWN DISPOSABLE) ×12 IMPLANT
GOWN STRL REUS W/ TWL XL LVL3 (GOWN DISPOSABLE) ×4 IMPLANT
GOWN STRL REUS W/TWL LRG LVL3 (GOWN DISPOSABLE) ×12
GOWN STRL REUS W/TWL XL LVL3 (GOWN DISPOSABLE) ×4
HEMOSTAT POWDER SURGIFOAM 1G (HEMOSTASIS) ×12 IMPLANT
HEMOSTAT SURGICEL 2X14 (HEMOSTASIS) ×4 IMPLANT
KIT BASIN OR (CUSTOM PROCEDURE TRAY) ×4 IMPLANT
KIT SUCTION CATH 14FR (SUCTIONS) ×4 IMPLANT
KIT TURNOVER KIT B (KITS) ×4 IMPLANT
KIT VASOVIEW HEMOPRO 2 VH 4000 (KITS) ×4 IMPLANT
LEAD PACING MYOCARDI (MISCELLANEOUS) ×4 IMPLANT
MARKER GRAFT CORONARY BYPASS (MISCELLANEOUS) ×12 IMPLANT
NS IRRIG 1000ML POUR BTL (IV SOLUTION) ×20 IMPLANT
PACK E OPEN HEART (SUTURE) ×4 IMPLANT
PACK OPEN HEART (CUSTOM PROCEDURE TRAY) ×4 IMPLANT
PAD ARMBOARD 7.5X6 YLW CONV (MISCELLANEOUS) ×8 IMPLANT
PAD ELECT DEFIB RADIOL ZOLL (MISCELLANEOUS) ×4 IMPLANT
PENCIL BUTTON HOLSTER BLD 10FT (ELECTRODE) ×4 IMPLANT
POSITIONER HEAD DONUT 9IN (MISCELLANEOUS) ×4 IMPLANT
PUNCH AORTIC ROTATE 4.0MM (MISCELLANEOUS) IMPLANT
PUNCH AORTIC ROTATE 4.5MM 8IN (MISCELLANEOUS) IMPLANT
PUNCH AORTIC ROTATE 5MM 8IN (MISCELLANEOUS) IMPLANT
SET CARDIOPLEGIA MPS 5001102 (MISCELLANEOUS) ×4 IMPLANT
SOL ANTI FOG 6CC (MISCELLANEOUS) ×2 IMPLANT
SOLUTION ANTI FOG 6CC (MISCELLANEOUS) ×2
SPONGE LAP 18X18 RF (DISPOSABLE) IMPLANT
SPONGE LAP 4X18 RFD (DISPOSABLE) IMPLANT
SUPPORT HEART JANKE-BARRON (MISCELLANEOUS) ×4 IMPLANT
SUT BONE WAX W31G (SUTURE) ×4 IMPLANT
SUT ETHIBOND X763 2 0 SH 1 (SUTURE) ×8 IMPLANT
SUT MNCRL AB 3-0 PS2 18 (SUTURE) ×8 IMPLANT
SUT MNCRL AB 4-0 PS2 18 (SUTURE) ×4 IMPLANT
SUT PDS AB 1 CTX 36 (SUTURE) ×8 IMPLANT
SUT PROLENE 2 0 SH DA (SUTURE) IMPLANT
SUT PROLENE 3 0 SH DA (SUTURE) ×4 IMPLANT
SUT PROLENE 3 0 SH1 36 (SUTURE) IMPLANT
SUT PROLENE 4 0 RB 1 (SUTURE)
SUT PROLENE 4 0 SH DA (SUTURE) ×8 IMPLANT
SUT PROLENE 4-0 RB1 .5 CRCL 36 (SUTURE) IMPLANT
SUT PROLENE 5 0 C 1 36 (SUTURE) ×12 IMPLANT
SUT PROLENE 6 0 C 1 30 (SUTURE) IMPLANT
SUT PROLENE 8 0 BV175 6 (SUTURE) IMPLANT
SUT PROLENE BLUE 7 0 (SUTURE) ×4 IMPLANT
SUT PROLENE POLY MONO (SUTURE) IMPLANT
SUT STEEL 6MS V (SUTURE) ×8 IMPLANT
SUT VIC AB 2-0 CT1 27 (SUTURE) ×2
SUT VIC AB 2-0 CT1 TAPERPNT 27 (SUTURE) ×2 IMPLANT
SUT VIC AB 2-0 CTX 36 (SUTURE) ×4 IMPLANT
SYSTEM SAHARA CHEST DRAIN ATS (WOUND CARE) ×4 IMPLANT
TAPE CLOTH SURG 4X10 WHT LF (GAUZE/BANDAGES/DRESSINGS) ×4 IMPLANT
TAPE PAPER 2X10 WHT MICROPORE (GAUZE/BANDAGES/DRESSINGS) ×4 IMPLANT
TOWEL GREEN STERILE (TOWEL DISPOSABLE) ×4 IMPLANT
TOWEL GREEN STERILE FF (TOWEL DISPOSABLE) ×4 IMPLANT
TRAY FOLEY SLVR 16FR TEMP STAT (SET/KITS/TRAYS/PACK) ×4 IMPLANT
TUBING LAP HI FLOW INSUFFLATIO (TUBING) ×4 IMPLANT
UNDERPAD 30X30 (UNDERPADS AND DIAPERS) ×4 IMPLANT
WATER STERILE IRR 1000ML POUR (IV SOLUTION) ×8 IMPLANT

## 2019-01-09 NOTE — Progress Notes (Signed)
Precabg Doppler study  has been completed. Refer to [facility] under chart review to view preliminary results.   01/09/2019  9:28 AM Rosely Fernandez, Bonnye Fava

## 2019-01-09 NOTE — Progress Notes (Signed)
[facility] Evening Rounds Day of surgery s/p CABG x 2 Hemodynamically stable Not bleeding; mildly vasoplegic responded to fluid A/P: continue to advance towards extubation. Regina Perry Z. Orvan Seen, [place]

## 2019-01-09 NOTE — Anesthesia Procedure Notes (Signed)
Central Venous Catheter Insertion Performed by: Audry Pili, MD, anesthesiologist Start/End10/14/2020 10:27 AM, 01/09/2019 10:37 AM Preanesthetic checklist: patient identified, IV checked, risks and benefits discussed, surgical consent, monitors and equipment checked, pre-op evaluation, timeout performed and anesthesia consent Position: Trendelenburg Lidocaine 1% used for infiltration and patient sedated Hand hygiene performed , maximum sterile barriers used  and Seldinger technique used Catheter size: 8.5 Fr Central line was placed.Sheath introducer Procedure performed using ultrasound guided technique. Ultrasound Notes:anatomy identified, needle tip was noted to be adjacent to the nerve/plexus identified, no ultrasound evidence of intravascular and/or intraneural injection and image(s) printed for medical record Attempts: 1 Following insertion, line sutured, dressing applied and Biopatch. Post procedure assessment: blood return through all ports, free fluid flow and no air  Patient tolerated the procedure well with no immediate complications.

## 2019-01-09 NOTE — Anesthesia Procedure Notes (Signed)
Procedure Name: Intubation Date/Time: 01/09/2019 1:42 PM Performed by: Kaytlen Lightsey T, CRNA Pre-anesthesia Checklist: Patient identified, Emergency Drugs available, Suction available and Patient being monitored Patient Re-evaluated:Patient Re-evaluated prior to induction Oxygen Delivery Method: Circle system utilized Preoxygenation: Pre-oxygenation with 100% oxygen Induction Type: IV induction Ventilation: Mask ventilation without difficulty Laryngoscope Size: Miller and 2 Grade View: Grade I Tube type: Oral Tube size: 7.5 mm Number of attempts: 1 Airway Equipment and Method: Patient positioned with wedge pillow and Stylet Placement Confirmation: ETT inserted through vocal cords under direct vision,  positive ETCO2 and breath sounds checked- equal and bilateral Secured at: 21 cm Tube secured with: Tape Dental Injury: Teeth and Oropharynx as per pre-operative assessment

## 2019-01-09 NOTE — Progress Notes (Signed)
Notified of case delay  By OR desk. Call placed to Dr Fransisco Beau. Of request for patient to return to floor during the delay. Dr Fransisco Beau in agreement. Patient awake and alert. RN to accompany patient to floor.

## 2019-01-09 NOTE — Progress Notes (Signed)
  Echocardiogram 2D Echocardiogram has been performed.  Regina Perry M 01/09/2019, 9:37 AM

## 2019-01-09 NOTE — Procedures (Signed)
Extubation Procedure Note  Patient Details:   Name: Regina Perry DOB: 01-23-1961 MRN: [id number]   Airway Documentation:    Vent end date: 01/09/19 Vent end time: 2350   Evaluation  O2 sats: stable throughout Complications: No apparent complications Patient did tolerate procedure well. Bilateral Breath Sounds: Clear, Diminished   Yes. Pt NIF -25, VC 920 ml, pt had cuff leak, pt able to speak name and has a good strong cough. RT coached pt w/IS pt able to obtain 600 ml. No stridor noted at this time. Pt respiratory status stable on 4 Lpm [place] w/humidity. RT will continue to monitor.   Roby Lofts [facility] [place] 01/09/2019, 11:58 PM

## 2019-01-09 NOTE — Plan of Care (Signed)
  Problem: Education: Goal: Knowledge of General Education information will improve Description: Including pain rating scale, medication(s)/side effects and non-pharmacologic comfort measures Outcome: Progressing   Problem: Health Behavior/Discharge Planning: Goal: Ability to manage health-related needs will improve Outcome: Progressing   Problem: Clinical Measurements: Goal: Ability to maintain clinical measurements within normal limits will improve Outcome: Progressing Goal: Will remain free from infection Outcome: Progressing   Problem: Activity: Goal: Risk for activity intolerance will decrease Outcome: Progressing   Problem: Nutrition: Goal: Adequate nutrition will be maintained Outcome: Progressing   Problem: Coping: Goal: Level of anxiety will decrease Outcome: Progressing   Problem: Elimination: Goal: Will not experience complications related to bowel motility Outcome: Progressing   Problem: Pain Managment: Goal: General experience of comfort will improve Outcome: Progressing   Problem: Safety: Goal: Ability to remain free from injury will improve Outcome: Progressing   

## 2019-01-09 NOTE — Brief Op Note (Signed)
01/08/2019 - 01/09/2019  11:04 AM  PATIENT:  Regina Perry  58 y.o. female  PRE-OPERATIVE DIAGNOSIS:  CORONARY ARTERY DISEASE  POST-OPERATIVE DIAGNOSIS:  CORONARY ARTERY DISEASE  PROCEDURE:  Procedure(s): CORONARY ARTERY BYPASS GRAFTING (CABG) X2 USING LEFT INTERNAL MAMMARY ARTERY TO CIRC AND RIGHT GREATER SAPHENOUS VEIN TO LAD. (N/A) Transesophageal Echocardiogram (Tee)   LIMA to LAD SVG to circ  SURGEON:  Surgeon(s) and Role:    * Lightfoot, Lucile Crater, MD - Primary  PHYSICIAN ASSISTANT:  Nicholes Rough, PA-C   ANESTHESIA:   general  EBL:  400 mL   BLOOD ADMINISTERED:none  DRAINS: routine   LOCAL MEDICATIONS USED:  NONE  SPECIMEN:  No Specimen  DISPOSITION OF SPECIMEN:  N/A  COUNTS:  YES  DICTATION: .Dragon Dictation  PLAN OF CARE: Admit to inpatient   PATIENT DISPOSITION:  ICU - intubated and hemodynamically stable.   Delay start of Pharmacological VTE agent (>24hrs) due to surgical blood loss or risk of bleeding: yes

## 2019-01-09 NOTE — Progress Notes (Signed)
Pt had good effort w/her VC and NIF. Pt NIF -25 and VC 920 ml. Pt ABG good for extubation.

## 2019-01-09 NOTE — Progress Notes (Signed)
RT assessed pts readiness to wean per heart wean protocol. Pt able to hold head off of pillow for 20+ sec, squeeze hand, stick out tongue, and wiggle toes upon RT request. Pt placed in first phase of wean mode. VT 420, RR 4, PEEP 5, FIO2 40%. RT will reassess pt for next phase in 20 minutes. RT will continue to monitor.

## 2019-01-09 NOTE — Anesthesia Preprocedure Evaluation (Addendum)
Anesthesia Evaluation  Patient identified by MRN, date of birth, ID band Patient awake    Reviewed: Allergy & Precautions, NPO status , Patient's Chart, lab work & pertinent test results  History of Anesthesia Complications (+) PONV and history of anesthetic complications  Airway Mallampati: II  TM Distance: >3 FB Neck ROM: Full    Dental  (+) Dental Advisory Given, Teeth Intact   Pulmonary Current Smoker and Patient abstained from smoking.,    Pulmonary exam normal        Cardiovascular hypertension, Pt. on medications and Pt. on home beta blockers + angina + CAD  Normal cardiovascular exam   '20 TTE - pending  '20 Carotid US - (prelim read) - Right Carotid: Velocities in the right ICA are consistent with a 40-59% stenosis. Left Carotid: Velocities in the left ICA are consistent with a 1-39% stenosis  '20 Cath - LM: Mid calcified 90% stenosis. LAD: Mid LAD focal calfied 60% stenosis after D2 LCx: Normal RCA: Ostial calcification without significant disease LVEF normal. LVEDP mildly elevated at 21 mmHg    Neuro/Psych  Headaches, PSYCHIATRIC DISORDERS Anxiety Depression    GI/Hepatic hiatal hernia, GERD  Medicated and Controlled,  Endo/Other    Renal/GU      Musculoskeletal   Abdominal   Peds  Hematology   Anesthesia Other Findings   Reproductive/Obstetrics  S/p hysterectomy                             Anesthesia Physical Anesthesia Plan  ASA: IV  Anesthesia Plan: General   Post-op Pain Management:    Induction: Intravenous  PONV Risk Score and Plan: 4 or greater and Treatment may vary due to age or medical condition  Airway Management Planned: Oral ETT  Additional Equipment: Arterial line, CVP, PA Cath, TEE and Ultrasound Guidance Line Placement  Intra-op Plan:   Post-operative Plan: Post-operative intubation/ventilation  Informed Consent: I have reviewed the  patients History and Physical, chart, labs and discussed the procedure including the risks, benefits and alternatives for the proposed anesthesia with the patient or authorized representative who has indicated his/her understanding and acceptance.     Dental advisory given  Plan Discussed with: CRNA and Anesthesiologist  Anesthesia Plan Comments:        Anesthesia Quick Evaluation

## 2019-01-09 NOTE — Progress Notes (Signed)
RT reassessed pt for next phase of heart wean. Pt able to follow all instructions/request of RT without difficulty. Pt placed in CPAP/PSV on 10/5 FIO2 40%. Pt respiratory status is stable at this time. RN will obtain ABG in 20 minutes to assess pt for readiness to be liberated from the vent. RT will have pt perform NIF and VC. RT will continue to monitor.

## 2019-01-09 NOTE — Anesthesia Procedure Notes (Signed)
Central Venous Catheter Insertion Performed by: Audry Pili, MD, anesthesiologist Start/End10/14/2020 10:35 AM, 01/09/2019 10:37 AM Patient location: Pre-op. Preanesthetic checklist: patient identified, IV checked, risks and benefits discussed, surgical consent, monitors and equipment checked, pre-op evaluation, timeout performed and anesthesia consent Position: Trendelenburg Hand hygiene performed  and maximum sterile barriers used  Total catheter length 10. PA cath was placed.Swan type:thermodilution PA Cath depth:47 Procedure performed without using ultrasound guided technique. Attempts: 1 Patient tolerated the procedure well with no immediate complications.

## 2019-01-09 NOTE — Progress Notes (Signed)
     [address]       [place] [address]             [phone]       No events since last night Pain controlled  Today's Vitals   01/08/19 2130 01/08/19 2230 01/09/19 0556 01/09/19 0823  BP:   124/66 122/73  Pulse:   (!) 55 60  Resp:   18   Temp:   97.7 F (36.5 C)   TempSrc:   Oral   SpO2:   93%   Weight:   62.9 kg   Height:      PainSc: 10-Worst pain ever Asleep     Body mass index is 24.57 kg/m. Alert NAD Sinus brady EWOB  CXR clear  LM CAD OR today for CABG X 2 Echo pending

## 2019-01-09 NOTE — Anesthesia Procedure Notes (Signed)
Arterial Line Insertion Start/End10/14/2020 10:15 AM, 01/09/2019 10:25 AM Performed by: Cleda Daub, CRNA, CRNA  Patient location: Pre-op. Preanesthetic checklist: patient identified, IV checked, site marked, risks and benefits discussed, surgical consent, monitors and equipment checked, pre-op evaluation and anesthesia consent Patient sedated Left, radial was placed Catheter size: 20 G Hand hygiene performed , maximum sterile barriers used  and Seldinger technique used Allen's test indicative of satisfactory collateral circulation Attempts: 1 Procedure performed without using ultrasound guided technique. Ultrasound Notes:anatomy identified, needle tip was noted to be adjacent to the nerve/plexus identified and no ultrasound evidence of intravascular and/or intraneural injection Following insertion, dressing applied and Biopatch. Post procedure assessment: normal  Patient tolerated the procedure well with no immediate complications.

## 2019-01-09 NOTE — Progress Notes (Signed)
Subjective:  Doing well. Has not had further chest pain since angiogram. Having bedside echo.   Intake/Output from previous day:  I/O last 3 completed shifts: In: 3 [I.V.:3] Out: -  No intake/output data recorded.  Blood pressure 122/73, pulse (!) 58, temperature 97.7 F (36.5 C), temperature source Oral, resp. rate 15, height _0  (1.6 m), weight 62.9 kg, SpO2 99 %. Physical Exam  Constitutional:  She is moderately built and well nourished in no acute distress.   HENT:  Head: Atraumatic.  Eyes: Conjunctivae are normal.  Neck: Neck supple. No JVD present. No thyromegaly present.  Cardiovascular: Normal rate, regular rhythm, normal heart sounds and intact distal pulses. Exam reveals no gallop.  No murmur heard. No leg edema, no JVD.  Pulmonary/Chest: Effort normal and breath sounds normal.  Abdominal: Soft. Bowel sounds are normal.  Musculoskeletal: Normal range of motion.  Neurological: She is alert.  Skin: Skin is warm and dry.  Psychiatric: She has a normal mood and affect.   Lab Results: BMP BNP (last 3 results) No results for input(s): BNP in the last 8760 hours.  ProBNP (last 3 results) No results for input(s): PROBNP in the last 8760 hours. BMP Latest Ref Rng & Units 01/09/2019 01/08/2019 01/04/2019  Glucose 70 - 99 mg/dL 105(H) - 95  BUN 6 - 20 mg/dL 10 - 11  Creatinine 0.44 - 1.00 mg/dL 0.74 0.83 0.85  BUN/Creat Ratio 9 - 23 - - 13  Sodium 135 - 145 mmol/L 140 - 141  Potassium 3.5 - 5.1 mmol/L 4.1 - 4.5  Chloride 98 - 111 mmol/L 107 - 103  CO2 22 - 32 mmol/L 23 - 24  Calcium 8.9 - 10.3 mg/dL 8.8(L) - 9.2   Hepatic Function Latest Ref Rng & Units 02/15/2013 09/25/2007  Total Protein 6.0 - 8.3 g/dL 7.0 7.1  Albumin 3.5 - 5.2 g/dL 3.8 4.1  AST 0 - 37 U/L 10 17  ALT 0 - 35 U/L 13 15  Alk Phosphatase 39 - 117 U/L 73 61  Total Bilirubin 0.3 - 1.2 mg/dL 0.3 1.1   CBC Latest Ref Rng & Units 01/09/2019 01/08/2019 01/04/2019  WBC 4.0 - 10.5 K/uL 6.9 8.7 8.3   Hemoglobin 12.0 - 15.0 g/dL 13.6 14.7 14.6  Hematocrit 36.0 - 46.0 % 41.2 44.8 43.9  Platelets 150 - 400 K/uL 209 219 214   Lipid Panel     Component Value Date/Time   CHOL 197 04/10/2012 0414   TRIG 251 (H) 04/10/2012 0414   HDL 42 04/10/2012 0414   CHOLHDL 4.7 04/10/2012 0414   VLDL 50 (H) 04/10/2012 0414   LDLCALC 105 (H) 04/10/2012 0414   Cardiac Panel (last 3 results) No results for input(s): CKTOTAL, CKMB, TROPONINI, RELINDX in the last 72 hours.  HEMOGLOBIN A1C Lab Results  Component Value Date   HGBA1C 6.0 (H) 01/08/2019   MPG 125.5 01/08/2019   TSH No results for input(s): TSH in the last 8760 hours.  Scheduled Meds: . [MAR Hold] ALPRAZolam  0.5 mg Oral BID  . [MAR Hold] aspirin EC  81 mg Oral Daily  . [MAR Hold] gabapentin  300 mg Oral TID  . [MAR Hold] heparin  5,000 Units Subcutaneous Q8H  . [MAR Hold] nicotine  7 mg Transdermal Daily  . [MAR Hold] pantoprazole  40 mg Oral Daily  . [MAR Hold] rosuvastatin  40 mg Oral Daily  . [MAR Hold] sodium chloride flush  3 mL Intravenous Q12H  . [MAR Hold] verapamil  180  mg Oral Daily   Continuous Infusions: . [MAR Hold] sodium chloride    . [MAR Hold] nitroGLYCERIN 5 mcg/min (01/08/19 1733)  . [MAR Hold] norepinephrine (LEVOPHED) Adult infusion     PRN Meds:.[MAR Hold] sodium chloride, [MAR Hold] acetaminophen, [MAR Hold] HYDROcodone-acetaminophen, [MAR Hold] nitroGLYCERIN, [MAR Hold] ondansetron (ZOFRAN) IV, [MAR Hold] promethazine, [MAR Hold] sodium chloride flush, [MAR Hold] SUMAtriptan  Cardiac Studies:  EKG 01/09/2019: Normal sinus rhythm at the rate of 60 bpm, normal axis.  No evidence of ischemia, normal EKG.  Echocardiogram 01/09/2019:   1. Left ventricular ejection fraction, by visual estimation, is 60 to 65%. The left ventricle has normal function. Normal left ventricular size. There is mildly increased left ventricular hypertrophy.  2. Mild plaque invoving the ascending aorta.  Assessment/Plan:  1.   Coronary artery disease of native circumflex involving the left main. 2. Hypercholesterolemia.  Recommendation: Patient is asymptomatic presently and has not had any chest pain or dyspnea overnight.  All questions regarding CABG and concerns regarding CABG were discussed with the patient.  Will follow-up on echocardiogram (reviewed and essentially normal). Presently on high dose high intensity statins.    Adrian Prows, M.D. 01/09/2019, 1:32 PM [facility] [place] Pager: [phone] Office: [phone] If no answer: [phone]

## 2019-01-09 NOTE — Transfer of Care (Signed)
Immediate Anesthesia Transfer of Care Note  Patient: Regina Perry  Procedure(s) Performed: CORONARY ARTERY BYPASS GRAFTING (CABG) X2 USING LEFT INTERNAL MAMMARY ARTERY TO CIRC AND RIGHT GREATER SAPHENOUS VEIN TO LAD. (N/A Chest) Transesophageal Echocardiogram [name])  Patient Location: ICU  Anesthesia Type:General  Level of Consciousness: Patient remains intubated per anesthesia plan  Airway & Oxygen Therapy: Patient remains intubated per anesthesia plan and Patient placed on Ventilator (see vital sign flow sheet for setting)  Post-op Assessment: Report given to RN and Post -op Vital signs reviewed and stable  Post vital signs: Reviewed and stable  Last Vitals:  Vitals Value Taken Time  BP    Temp    Pulse    Resp    SpO2      Last Pain:  Vitals:   01/09/19 1151  TempSrc:   PainSc: 3       Patients Stated Pain Goal: 0 (00/37/04 8889)  Complications: No apparent anesthesia complications

## 2019-01-09 NOTE — Progress Notes (Signed)
Surgical case delayed. Report called nurse Alver Fisher on 6E.

## 2019-01-09 NOTE — Op Note (Signed)
[address]       [place] [address]             [phone]                                          01/09/2019 Patient:  THIRZA PELLICANO Pre-Op Dx: Verl Dicker CAD, unstable angina   Post-op Dx:  same Procedure: CABG X 2.  LIMA LAD, RSVG OM2   Endoscopic greater saphenous vein harvest on the right Intra-operative Transesophageal Echocardiogram  Surgeon and Role:      * Maryori Weide, Lucile Crater, MD - Primary    * T. Harriet Pho PA-C - assisting   Anesthesia  general EBL:  415ml Blood Administration: none Xclamp Time:  30 min Pump Time:  82min  Drains: 20 F blake drain: L, mediastinal  Wires: atrial wires Counts: correct   Indications: 58 yo female admitted to the hospital following a LHC which showed severe LM disease.  Following the procedure, she had some anginal chest pain, and was placed on nitro gtt.  She states that she had anginal symptoms that woke her from sleep recently.  She also had similar symptoms in January, and with mild exertion.  Findings: Good conduit, good targets.  Good function on post-CPB TEE  Operative Technique: All invasive lines were placed in pre-op holding.  After the risks, benefits and alternatives were thoroughly discussed, the patient was brought to the operative theatre.  Anesthesia was induced, and the patient was prepped and draped in normal sterile fashion.  An appropriate surgical pause was performed, and pre-operative antibiotics were dosed accordingly.  We began with simultaneous incisions were made along the right leg for harvesting of the greater saphenous vein and the chest for the sternotomy.  In regards to the sternotomy, this was carried down with bovie cautery, and the sternum was divided with a reciprocating saw.  Meticulous hemostasis was obtained.  The left internal thoracic artery was exposed and harvested in in pedicled fashion.  The patient was systemically heparinized, and the artery was divided distally, and placed  in a papaverine sponge.    The sternal elevator was removed, and a retractor was placed.  The pericardium was divided in the midline and fashioned into a cradle with pericardial stitches.   After we confirmed an appropriate ACT, the ascending aorta was cannulated in standard fashion.  The right atrial appendage was used for venous cannulation site.  Cardiopulmonary bypass was initiated, and the heart retractor was placed. The cross clamp was applied, and a dose of anterograde cardioplegia was given with good arrest of the heart.    Next we exposed the lateral wall, and found a good target on the OM vessel.  An end to side anastomosis with the vein graft was then created.  Finally, we exposed a good target on the LAD, and fashioned an end to side anastomosis between it and the LITA.  We began to re-warm, and a re-animation dose of cardioplegia was given.  The heart was de-aired, and the cross clamp was removed.  Meticulous hemostasis was obtained.    A partial occludding clamp was then placed on the ascending aorta, and we created an end to side anastomosis between it and the proximal vein graft.  The proximal site was marked with a ring.  Hemostasis was obtained, and we separated from cardiopulmonary bypass  without event.the heparin was reversed with protamine.  Chest tubes and wires were placed, and the sternum was re-approximated with with sternal wires.  The soft tissue and skin were re-approximated wth absorbable suture.    The patient tolerated the procedure without any immediate complications, and was transferred to the ICU in guarded condition.  Jaekwon Mcclune Keane Scrape

## 2019-01-09 NOTE — Progress Notes (Signed)
6E unable to take pt back for floor due to A-Line, 2H has room 3 available.  Called report to [facility] RN--pt aware of delay of surgery. RN to accompany during transport.

## 2019-01-09 NOTE — Progress Notes (Signed)
Husband called and made aware of delay with surgery.  Son to come and sit with patient until she is called for the OR.  All questions answered and support given.

## 2019-01-10 ENCOUNTER — Inpatient Hospital Stay (HOSPITAL_COMMUNITY): Payer: Medicare HMO

## 2019-01-10 ENCOUNTER — Encounter (HOSPITAL_COMMUNITY): Payer: Self-pay | Admitting: Thoracic Surgery (Cardiothoracic Vascular Surgery)

## 2019-01-10 LAB — POCT I-STAT, CHEM 8
BUN: 8 mg/dL (ref 6–20)
BUN: 6 mg/dL (ref 6–20)
BUN: 7 mg/dL (ref 6–20)
Calcium, Ion: 1.19 mmol/L (ref 1.15–1.40)
Calcium, Ion: 0.95 mmol/L — ABNORMAL LOW (ref 1.15–1.40)
Calcium, Ion: 1.22 mmol/L (ref 1.15–1.40)
Chloride: 107 mmol/L (ref 98–111)
Chloride: 104 mmol/L (ref 98–111)
Chloride: 107 mmol/L (ref 98–111)
Creatinine, Ser: 0.5 mg/dL (ref 0.44–1.00)
Creatinine, Ser: 0.4 mg/dL — ABNORMAL LOW (ref 0.44–1.00)
Creatinine, Ser: 0.5 mg/dL (ref 0.44–1.00)
Glucose, Bld: 99 mg/dL (ref 70–99)
Glucose, Bld: 101 mg/dL — ABNORMAL HIGH (ref 70–99)
Glucose, Bld: 86 mg/dL (ref 70–99)
HCT: 36 % (ref 36.0–46.0)
HCT: 24 % — ABNORMAL LOW (ref 36.0–46.0)
HCT: 32 % — ABNORMAL LOW (ref 36.0–46.0)
Hemoglobin: 12.2 g/dL (ref 12.0–15.0)
Hemoglobin: 10.9 g/dL — ABNORMAL LOW (ref 12.0–15.0)
Hemoglobin: 8.2 g/dL — ABNORMAL LOW (ref 12.0–15.0)
Potassium: 4.1 mmol/L (ref 3.5–5.1)
Potassium: 3.9 mmol/L (ref 3.5–5.1)
Potassium: 4.1 mmol/L (ref 3.5–5.1)
Sodium: 143 mmol/L (ref 135–145)
Sodium: 143 mmol/L (ref 135–145)
Sodium: 145 mmol/L (ref 135–145)
TCO2: 27 mmol/L (ref 22–32)
TCO2: 24 mmol/L (ref 22–32)
TCO2: 27 mmol/L (ref 22–32)

## 2019-01-10 LAB — POCT I-STAT 7, (LYTES, BLD GAS, ICA,H+H)
Acid-base deficit: 4 mmol/L — ABNORMAL HIGH (ref 0.0–2.0)
Acid-base deficit: 4 mmol/L — ABNORMAL HIGH (ref 0.0–2.0)
Acid-base deficit: 1 mmol/L (ref 0.0–2.0)
Acid-base deficit: 1 mmol/L (ref 0.0–2.0)
Acid-base deficit: 1 mmol/L (ref 0.0–2.0)
Acid-base deficit: 4 mmol/L — ABNORMAL HIGH (ref 0.0–2.0)
Bicarbonate: 21.2 mmol/L (ref 20.0–28.0)
Bicarbonate: 21.8 mmol/L (ref 20.0–28.0)
Bicarbonate: 23.4 mmol/L (ref 20.0–28.0)
Bicarbonate: 23.9 mmol/L (ref 20.0–28.0)
Bicarbonate: 24.4 mmol/L (ref 20.0–28.0)
Bicarbonate: 25.5 mmol/L (ref 20.0–28.0)
Bicarbonate: 25.6 mmol/L (ref 20.0–28.0)
Bicarbonate: 25.7 mmol/L (ref 20.0–28.0)
Calcium, Ion: 1.11 mmol/L — ABNORMAL LOW (ref 1.15–1.40)
Calcium, Ion: 1.12 mmol/L — ABNORMAL LOW (ref 1.15–1.40)
Calcium, Ion: 0.89 mmol/L — CL (ref 1.15–1.40)
Calcium, Ion: 1.07 mmol/L — ABNORMAL LOW (ref 1.15–1.40)
Calcium, Ion: 1.21 mmol/L (ref 1.15–1.40)
Calcium, Ion: 1.24 mmol/L (ref 1.15–1.40)
Calcium, Ion: 1.16 mmol/L (ref 1.15–1.40)
Calcium, Ion: 1.18 mmol/L (ref 1.15–1.40)
HCT: 24 % — ABNORMAL LOW (ref 36.0–46.0)
HCT: 24 % — ABNORMAL LOW (ref 36.0–46.0)
HCT: 27 % — ABNORMAL LOW (ref 36.0–46.0)
HCT: 28 % — ABNORMAL LOW (ref 36.0–46.0)
HCT: 33 % — ABNORMAL LOW (ref 36.0–46.0)
HCT: 38 % (ref 36.0–46.0)
HCT: 26 % — ABNORMAL LOW (ref 36.0–46.0)
HCT: 28 % — ABNORMAL LOW (ref 36.0–46.0)
Hemoglobin: 8.2 g/dL — ABNORMAL LOW (ref 12.0–15.0)
Hemoglobin: 8.2 g/dL — ABNORMAL LOW (ref 12.0–15.0)
Hemoglobin: 11.2 g/dL — ABNORMAL LOW (ref 12.0–15.0)
Hemoglobin: 12.9 g/dL (ref 12.0–15.0)
Hemoglobin: 9.2 g/dL — ABNORMAL LOW (ref 12.0–15.0)
Hemoglobin: 9.5 g/dL — ABNORMAL LOW (ref 12.0–15.0)
Hemoglobin: 8.8 g/dL — ABNORMAL LOW (ref 12.0–15.0)
Hemoglobin: 9.5 g/dL — ABNORMAL LOW (ref 12.0–15.0)
O2 Saturation: 93 %
O2 Saturation: 94 %
O2 Saturation: 100 %
O2 Saturation: 100 %
O2 Saturation: 100 %
O2 Saturation: 99 %
O2 Saturation: 82 %
O2 Saturation: 85 %
Patient temperature: 36.4
Patient temperature: 36.6
Patient temperature: 36.4
Patient temperature: 97.6
Potassium: 3.4 mmol/L — ABNORMAL LOW (ref 3.5–5.1)
Potassium: 3.5 mmol/L (ref 3.5–5.1)
Potassium: 3.9 mmol/L (ref 3.5–5.1)
Potassium: 4 mmol/L (ref 3.5–5.1)
Potassium: 4 mmol/L (ref 3.5–5.1)
Potassium: 4.3 mmol/L (ref 3.5–5.1)
Potassium: 3.8 mmol/L (ref 3.5–5.1)
Potassium: 3.9 mmol/L (ref 3.5–5.1)
Sodium: 143 mmol/L (ref 135–145)
Sodium: 143 mmol/L (ref 135–145)
Sodium: 142 mmol/L (ref 135–145)
Sodium: 142 mmol/L (ref 135–145)
Sodium: 144 mmol/L (ref 135–145)
Sodium: 144 mmol/L (ref 135–145)
Sodium: 137 mmol/L (ref 135–145)
Sodium: 138 mmol/L (ref 135–145)
TCO2: 22 mmol/L (ref 22–32)
TCO2: 23 mmol/L (ref 22–32)
TCO2: 24 mmol/L (ref 22–32)
TCO2: 26 mmol/L (ref 22–32)
TCO2: 26 mmol/L (ref 22–32)
TCO2: 27 mmol/L (ref 22–32)
TCO2: 27 mmol/L (ref 22–32)
TCO2: 27 mmol/L (ref 22–32)
pCO2 arterial: 36.4 mmHg (ref 32.0–48.0)
pCO2 arterial: 39.1 mmHg (ref 32.0–48.0)
pCO2 arterial: 36.1 mmHg (ref 32.0–48.0)
pCO2 arterial: 44.7 mmHg (ref 32.0–48.0)
pCO2 arterial: 50.7 mmHg — ABNORMAL HIGH (ref 32.0–48.0)
pCO2 arterial: 53.3 mmHg — ABNORMAL HIGH (ref 32.0–48.0)
pCO2 arterial: 46.7 mmHg (ref 32.0–48.0)
pCO2 arterial: 47.7 mmHg (ref 32.0–48.0)
pH, Arterial: 7.352 (ref 7.350–7.450)
pH, Arterial: 7.372 (ref 7.350–7.450)
pH, Arterial: 7.26 — ABNORMAL LOW (ref 7.350–7.450)
pH, Arterial: 7.31 — ABNORMAL LOW (ref 7.350–7.450)
pH, Arterial: 7.345 — ABNORMAL LOW (ref 7.350–7.450)
pH, Arterial: 7.42 (ref 7.350–7.450)
pH, Arterial: 7.338 — ABNORMAL LOW (ref 7.350–7.450)
pH, Arterial: 7.345 — ABNORMAL LOW (ref 7.350–7.450)
pO2, Arterial: 66 mmHg — ABNORMAL LOW (ref 83.0–108.0)
pO2, Arterial: 71 mmHg — ABNORMAL LOW (ref 83.0–108.0)
pO2, Arterial: 132 mmHg — ABNORMAL HIGH (ref 83.0–108.0)
pO2, Arterial: 285 mmHg — ABNORMAL HIGH (ref 83.0–108.0)
pO2, Arterial: 358 mmHg — ABNORMAL HIGH (ref 83.0–108.0)
pO2, Arterial: 361 mmHg — ABNORMAL HIGH (ref 83.0–108.0)
pO2, Arterial: 48 mmHg — ABNORMAL LOW (ref 83.0–108.0)
pO2, Arterial: 52 mmHg — ABNORMAL LOW (ref 83.0–108.0)

## 2019-01-10 LAB — COMPREHENSIVE METABOLIC PANEL
ALT: 14 U/L (ref 0–44)
AST: 23 U/L (ref 15–41)
Albumin: 3.9 g/dL (ref 3.5–5.0)
Alkaline Phosphatase: 42 U/L (ref 38–126)
Anion gap: 8 (ref 5–15)
BUN: 9 mg/dL (ref 6–20)
CO2: 21 mmol/L — ABNORMAL LOW (ref 22–32)
Calcium: 7.5 mg/dL — ABNORMAL LOW (ref 8.9–10.3)
Chloride: 112 mmol/L — ABNORMAL HIGH (ref 98–111)
Creatinine, Ser: 0.73 mg/dL (ref 0.44–1.00)
GFR calc Af Amer: >60 mL/min (ref >60)
GFR calc non Af Amer: >60 mL/min (ref >60)
Glucose, Bld: 139 mg/dL — ABNORMAL HIGH (ref 70–99)
Potassium: 3.7 mmol/L (ref 3.5–5.1)
Sodium: 141 mmol/L (ref 135–145)
Total Bilirubin: 0.9 mg/dL (ref 0.3–1.2)
Total Protein: 5.1 g/dL — ABNORMAL LOW (ref 6.5–8.1)

## 2019-01-10 LAB — GLUCOSE, CAPILLARY
Glucose-Capillary: 109 mg/dL — ABNORMAL HIGH (ref 70–99)
Glucose-Capillary: 122 mg/dL — ABNORMAL HIGH (ref 70–99)
Glucose-Capillary: 126 mg/dL — ABNORMAL HIGH (ref 70–99)
Glucose-Capillary: 131 mg/dL — ABNORMAL HIGH (ref 70–99)
Glucose-Capillary: 134 mg/dL — ABNORMAL HIGH (ref 70–99)
Glucose-Capillary: 151 mg/dL — ABNORMAL HIGH (ref 70–99)
Glucose-Capillary: 191 mg/dL — ABNORMAL HIGH (ref 70–99)
Glucose-Capillary: 94 mg/dL (ref 70–99)
Glucose-Capillary: 99 mg/dL (ref 70–99)

## 2019-01-10 LAB — BASIC METABOLIC PANEL
Anion gap: 8 (ref 5–15)
Anion gap: 9 (ref 5–15)
BUN: 7 mg/dL (ref 6–20)
BUN: 10 mg/dL (ref 6–20)
CO2: 22 mmol/L (ref 22–32)
CO2: 25 mmol/L (ref 22–32)
Calcium: 7.6 mg/dL — ABNORMAL LOW (ref 8.9–10.3)
Calcium: 8.4 mg/dL — ABNORMAL LOW (ref 8.9–10.3)
Chloride: 112 mmol/L — ABNORMAL HIGH (ref 98–111)
Chloride: 104 mmol/L (ref 98–111)
Creatinine, Ser: 0.68 mg/dL (ref 0.44–1.00)
Creatinine, Ser: 0.81 mg/dL (ref 0.44–1.00)
GFR calc Af Amer: >60 mL/min (ref >60)
GFR calc Af Amer: >60 mL/min (ref >60)
GFR calc non Af Amer: >60 mL/min (ref >60)
GFR calc non Af Amer: >60 mL/min (ref >60)
Glucose, Bld: 109 mg/dL — ABNORMAL HIGH (ref 70–99)
Glucose, Bld: 137 mg/dL — ABNORMAL HIGH (ref 70–99)
Potassium: 4.2 mmol/L (ref 3.5–5.1)
Potassium: 3.9 mmol/L (ref 3.5–5.1)
Sodium: 142 mmol/L (ref 135–145)
Sodium: 138 mmol/L (ref 135–145)

## 2019-01-10 LAB — CBC
HCT: 28.7 % — ABNORMAL LOW (ref 36.0–46.0)
HCT: 29.1 % — ABNORMAL LOW (ref 36.0–46.0)
Hemoglobin: 9.4 g/dL — ABNORMAL LOW (ref 12.0–15.0)
Hemoglobin: 9.7 g/dL — ABNORMAL LOW (ref 12.0–15.0)
MCH: 29.7 pg (ref 26.0–34.0)
MCH: 30.3 pg (ref 26.0–34.0)
MCHC: 32.8 g/dL (ref 30.0–36.0)
MCHC: 33.3 g/dL (ref 30.0–36.0)
MCV: 90.8 fL (ref 80.0–100.0)
MCV: 90.9 fL (ref 80.0–100.0)
Platelets: 143 10*3/uL — ABNORMAL LOW (ref 150–400)
Platelets: 182 10*3/uL (ref 150–400)
RBC: 3.16 MIL/uL — ABNORMAL LOW (ref 3.87–5.11)
RBC: 3.2 MIL/uL — ABNORMAL LOW (ref 3.87–5.11)
RDW: 12.7 % (ref 11.5–15.5)
RDW: 12.9 % (ref 11.5–15.5)
WBC: 10.6 10*3/uL — ABNORMAL HIGH (ref 4.0–10.5)
WBC: 19 10*3/uL — ABNORMAL HIGH (ref 4.0–10.5)
nRBC: 0 % (ref 0.0–0.2)
nRBC: 0 % (ref 0.0–0.2)

## 2019-01-10 LAB — ECHO INTRAOPERATIVE TEE
Height: 63 in
Weight: 2219.2 oz

## 2019-01-10 LAB — MAGNESIUM
Magnesium: 2.5 mg/dL — ABNORMAL HIGH (ref 1.7–2.4)
Magnesium: 2.1 mg/dL (ref 1.7–2.4)

## 2019-01-10 MED ORDER — INSULIN ASPART 100 UNIT/ML ~~LOC~~ SOLN
0.0000 [IU] | SUBCUTANEOUS | Status: DC
  Administered 2019-01-10 (×3): 2 [IU] via SUBCUTANEOUS

## 2019-01-10 MED ORDER — LIDOCAINE 5 % EX PTCH
1.0000 | MEDICATED_PATCH | CUTANEOUS | Status: DC
  Administered 2019-01-10 – 2019-01-15 (×4): 1 via TRANSDERMAL
  Filled 2019-01-10 (×7): qty 1

## 2019-01-10 MED ORDER — LEVALBUTEROL HCL 0.63 MG/3ML IN NEBU
0.6300 mg | INHALATION_SOLUTION | Freq: Four times a day (QID) | RESPIRATORY_TRACT | Status: DC
  Administered 2019-01-10 – 2019-01-12 (×7): 0.63 mg via RESPIRATORY_TRACT
  Filled 2019-01-10 (×7): qty 3

## 2019-01-10 MED ORDER — METHYLPREDNISOLONE SODIUM SUCC 125 MG IJ SOLR
125.0000 mg | Freq: Every day | INTRAMUSCULAR | Status: AC
  Administered 2019-01-10: 125 mg via INTRAVENOUS
  Filled 2019-01-10: qty 2

## 2019-01-10 MED ORDER — MORPHINE SULFATE (PF) 2 MG/ML IV SOLN
2.0000 mg | INTRAVENOUS | Status: DC | PRN
  Administered 2019-01-10 – 2019-01-11 (×8): 2 mg via INTRAVENOUS
  Filled 2019-01-10 (×8): qty 1

## 2019-01-10 MED ORDER — FUROSEMIDE 10 MG/ML IJ SOLN
40.0000 mg | Freq: Once | INTRAMUSCULAR | Status: AC
  Administered 2019-01-10: 40 mg via INTRAVENOUS
  Filled 2019-01-10: qty 4

## 2019-01-10 MED ORDER — PANTOPRAZOLE SODIUM 40 MG PO TBEC
40.0000 mg | DELAYED_RELEASE_TABLET | Freq: Every day | ORAL | Status: DC
  Administered 2019-01-10 – 2019-01-16 (×7): 40 mg via ORAL
  Filled 2019-01-10 (×7): qty 1

## 2019-01-10 MED ORDER — ENOXAPARIN SODIUM 40 MG/0.4ML ~~LOC~~ SOLN
40.0000 mg | Freq: Every day | SUBCUTANEOUS | Status: DC
  Administered 2019-01-10 – 2019-01-15 (×6): 40 mg via SUBCUTANEOUS
  Filled 2019-01-10 (×6): qty 0.4

## 2019-01-10 MED ORDER — OXYCODONE-ACETAMINOPHEN 7.5-325 MG PO TABS
1.0000 | ORAL_TABLET | ORAL | Status: DC | PRN
  Administered 2019-01-10 – 2019-01-15 (×15): 1 via ORAL
  Filled 2019-01-10 (×15): qty 1

## 2019-01-10 MED ORDER — POTASSIUM CHLORIDE 10 MEQ/50ML IV SOLN
10.0000 meq | INTRAVENOUS | Status: AC
  Administered 2019-01-10 (×3): 10 meq via INTRAVENOUS

## 2019-01-10 MED ORDER — INSULIN ASPART 100 UNIT/ML ~~LOC~~ SOLN
0.0000 [IU] | SUBCUTANEOUS | Status: DC
  Administered 2019-01-10 – 2019-01-12 (×9): 2 [IU] via SUBCUTANEOUS

## 2019-01-10 MED ORDER — SODIUM CHLORIDE 0.9 % IV SOLN: INTRAVENOUS | Status: DC | PRN

## 2019-01-10 MED ORDER — FUROSEMIDE 10 MG/ML IJ SOLN
40.0000 mg | Freq: Once | INTRAMUSCULAR | Status: AC
  Administered 2019-01-11: 40 mg via INTRAVENOUS
  Filled 2019-01-10: qty 4

## 2019-01-10 NOTE — Progress Notes (Signed)
Patient ID: ANYELA NAPIERKOWSKI, female   DOB: September 24, 1960, 58 y.o.   MRN: [id number] EVENING ROUNDS NOTE :     [address]       [place] [address]             [phone]                 1 Day Post-Op Procedure(s) (LRB): CORONARY ARTERY BYPASS GRAFTING (CABG) X2 USING LEFT INTERNAL MAMMARY ARTERY TO CIRC AND RIGHT GREATER SAPHENOUS VEIN TO LAD. (N/A) Transesophageal Echocardiogram (Tee)  Total Length of Stay:  LOS: 2 days  BP (!) 93/43   Pulse 81   Temp 97.6 F (36.4 C) (Oral)   Resp (!) 21   Ht 5\' 3"  (1.6 m)   Wt 63.4 kg   SpO2 (!) 81%   BMI 24.76 kg/m   .Intake/Output      10/14 0701 - 10/15 0700 10/15 0701 - 10/16 0700   P.O.  [address]   I.V. (mL/kg) 3953.1 (62.4) 125.1 (2)   Blood 200    IV Piggyback 894.7 100   Total Intake(mL/kg) 5047.8 (79.6) 945.1 (14.9)   Urine (mL/kg/hr) 4160 (2.7) 1775 (2.4)   Blood 400    Chest Tube 280 10   Total Output 4840 1785   Net +207.8 -839.9          . sodium chloride Stopped (01/10/19 0743)  . sodium chloride    . sodium chloride    . cefUROXime (ZINACEF)  IV 1.5 g (01/10/19 0806)  . dexmedetomidine (PRECEDEX) IV infusion 0.2 mcg/kg/hr (01/10/19 0500)  . lactated ringers    . lactated ringers    . lactated ringers 10 mL/hr at 01/10/19 0800  . niCARDipine       Lab Results  Component Value Date   WBC 19.0 (H) 01/10/2019   HGB 9.7 (L) 01/10/2019   HCT 29.1 (L) 01/10/2019   PLT 182 01/10/2019   GLUCOSE 137 (H) 01/10/2019   CHOL 197 04/10/2012   TRIG 251 (H) 04/10/2012   HDL 42 04/10/2012   LDLCALC 105 (H) 04/10/2012   ALT 14 01/09/2019   AST 23 01/09/2019   NA 138 01/10/2019   K 3.9 01/10/2019   CL 104 01/10/2019   CREATININE 0.81 01/10/2019   BUN 10 01/10/2019   CO2 25 01/10/2019   INR 1.4 (H) 01/09/2019   HGBA1C 6.0 (H) 01/08/2019   Increased sob hypoxia this evening , diffuse wheezing  To get chest xray now Start breathing treatment Lasix 40 mg iv     Grace Isaac MD  Beeper [phone]  Office [phone] 01/10/2019 6:52 PM

## 2019-01-10 NOTE — Progress Notes (Signed)
Pt ambulated in hall with Ava walker for 486ft on 6L [place] O2 and on tele monitoring. Near end of ambulation, pt's O2 sat dropped to 82%, so pt was assisted back to bed, and O2 increased to 8L. Brantley, RT called to assess pt when pt failed to recover her O2 sats with rest. Partial non-rebreather mask placed on pt and sats increased to 90%. Will continue to monitor pt and will inform Dr Kipp Brood of incident during evening rounds.

## 2019-01-10 NOTE — Progress Notes (Signed)
EVENING ROUNDS NOTE :     [address]       [place] [address]             [phone]                 1 Day Post-Op Procedure(s) (LRB): CORONARY ARTERY BYPASS GRAFTING (CABG) X2 USING LEFT INTERNAL MAMMARY ARTERY TO CIRC AND RIGHT GREATER SAPHENOUS VEIN TO LAD. (N/A) Transesophageal Echocardiogram (Tee)  Total Length of Stay:  LOS: 2 days  BP 105/65   Pulse 73   Temp 97.6 F (36.4 C) (Axillary)   Resp (!) 9   Ht 5\' 3"  (1.6 m)   Wt 63.4 kg   SpO2 95%   BMI 24.76 kg/m   .Intake/Output      10/15 0701 - 10/16 0700   P.O. 720   I.V. (mL/kg) 161.9 (2.6)   Blood    IV Piggyback 100   Total Intake(mL/kg) 981.9 (15.5)   Urine (mL/kg/hr) 2075 (2.2)   Blood    Chest Tube 48   Total Output 2123   Net -1141.1         . sodium chloride Stopped (01/10/19 0743)  . sodium chloride    . sodium chloride    . cefUROXime (ZINACEF)  IV 1.5 g (01/10/19 2048)  . dexmedetomidine (PRECEDEX) IV infusion 0.2 mcg/kg/hr (01/10/19 0500)  . lactated ringers    . lactated ringers    . lactated ringers 20 mL/hr at 01/10/19 2000  . niCARDipine       Lab Results  Component Value Date   WBC 19.0 (H) 01/10/2019   HGB 9.7 (L) 01/10/2019   HCT 29.1 (L) 01/10/2019   PLT 182 01/10/2019   GLUCOSE 137 (H) 01/10/2019   CHOL 197 04/10/2012   TRIG 251 (H) 04/10/2012   HDL 42 04/10/2012   LDLCALC 105 (H) 04/10/2012   ALT 14 01/09/2019   AST 23 01/09/2019   NA 138 01/10/2019   K 3.9 01/10/2019   CL 104 01/10/2019   CREATININE 0.81 01/10/2019   BUN 10 01/10/2019   CO2 25 01/10/2019   INR 1.4 (H) 01/09/2019   HGBA1C 6.0 (H) 01/08/2019   Follow up now, patient now on bipap and toerating , wheezing less , still 100% , o2 sat on monitor with good wave form 94-94 abg sat 83 300 ml uop past 3 hours Appears more comfortable currently Keep npo Replace aline  Continue respiratory rx    Grace Isaac MD  Beeper [phone] Office [phone] 01/10/2019 9:44 PM

## 2019-01-10 NOTE — Progress Notes (Signed)
Late note: RT called to pt room for pts sats in the mid 80's on NRB 100%. Pt has increased WOB, with a sat of 85%. RT administered breathing treatment and initiated BIPAP V60 for pts decreased sat and WOB. Pt placed on 16/10 w/100% FIO2. RT continued to monitor pt in the room. Pt sats and WOB not getting better. RT increased IPAP/EPAP to 24/16 MD Servando Snare made aware of pts respiratory status. ABG ordered with the following results after post placement of BIPAP.  Ph  7.345 PCO2   46.7 PaO2   52 HCO3  25.6   Second gas was drawn after pt had been on BIPAP V60 for 1 hour with the following results.  PH  7.338 PCO2  47.7 PaO2 48 HCO3  25.7  MD Gerhardt notified of pts ABG results both times and placed an order for Art line. MD came to bedside to see pt. Pts WOB is better and SpO2 is now 96 on BIPAP. MD wants another ABG once Art Line is in place, will call with results once line is in place.This RT attempted x 2 w/good blood return but unable to thread catheter with good blood return. RT has asked another RT to come attempt to place Art Line. RT will continue to monitor.

## 2019-01-10 NOTE — Anesthesia Postprocedure Evaluation (Signed)
Anesthesia Post Note  Patient: Regina Perry  Procedure(s) Performed: CORONARY ARTERY BYPASS GRAFTING (CABG) X2 USING LEFT INTERNAL MAMMARY ARTERY TO CIRC AND RIGHT GREATER SAPHENOUS VEIN TO LAD. (N/A Chest) Transesophageal Echocardiogram Regina Perry)     Patient location during evaluation: SICU Anesthesia Type: General Level of consciousness: sedated Pain management: pain level controlled Vital Signs Assessment: post-procedure vital signs reviewed and stable Respiratory status: patient remains intubated per anesthesia plan Cardiovascular status: stable Postop Assessment: no apparent nausea or vomiting Anesthetic complications: no    Last Vitals:  Vitals:   01/10/19 1100 01/10/19 1500  BP:    Pulse:    Resp:    Temp: 36.8 C 36.4 C  SpO2:      Last Pain:  Vitals:   01/10/19 1500  TempSrc: Oral  PainSc:                  Regina Perry

## 2019-01-11 ENCOUNTER — Inpatient Hospital Stay (HOSPITAL_COMMUNITY): Payer: Medicare HMO

## 2019-01-11 ENCOUNTER — Ambulatory Visit: Payer: Medicare HMO | Admitting: Cardiology

## 2019-01-11 LAB — GLUCOSE, CAPILLARY
Glucose-Capillary: 107 mg/dL — ABNORMAL HIGH (ref 70–99)
Glucose-Capillary: 124 mg/dL — ABNORMAL HIGH (ref 70–99)
Glucose-Capillary: 129 mg/dL — ABNORMAL HIGH (ref 70–99)
Glucose-Capillary: 131 mg/dL — ABNORMAL HIGH (ref 70–99)
Glucose-Capillary: 138 mg/dL — ABNORMAL HIGH (ref 70–99)
Glucose-Capillary: 144 mg/dL — ABNORMAL HIGH (ref 70–99)
Glucose-Capillary: 157 mg/dL — ABNORMAL HIGH (ref 70–99)

## 2019-01-11 LAB — BASIC METABOLIC PANEL
Anion gap: 13 (ref 5–15)
BUN: 14 mg/dL (ref 6–20)
CO2: 26 mmol/L (ref 22–32)
Calcium: 8.3 mg/dL — ABNORMAL LOW (ref 8.9–10.3)
Chloride: 101 mmol/L (ref 98–111)
Creatinine, Ser: 0.88 mg/dL (ref 0.44–1.00)
GFR calc Af Amer: >60 mL/min (ref >60)
GFR calc non Af Amer: >60 mL/min (ref >60)
Glucose, Bld: 145 mg/dL — ABNORMAL HIGH (ref 70–99)
Potassium: 3.7 mmol/L (ref 3.5–5.1)
Sodium: 140 mmol/L (ref 135–145)

## 2019-01-11 LAB — CBC
HCT: 28.6 % — ABNORMAL LOW (ref 36.0–46.0)
Hemoglobin: 9.7 g/dL — ABNORMAL LOW (ref 12.0–15.0)
MCH: 30.3 pg (ref 26.0–34.0)
MCHC: 33.9 g/dL (ref 30.0–36.0)
MCV: 89.4 fL (ref 80.0–100.0)
Platelets: 144 10*3/uL — ABNORMAL LOW (ref 150–400)
RBC: 3.2 MIL/uL — ABNORMAL LOW (ref 3.87–5.11)
RDW: 12.8 % (ref 11.5–15.5)
WBC: 18.4 10*3/uL — ABNORMAL HIGH (ref 4.0–10.5)
nRBC: 0 % (ref 0.0–0.2)

## 2019-01-11 MED ORDER — FUROSEMIDE 40 MG PO TABS
40.0000 mg | ORAL_TABLET | Freq: Two times a day (BID) | ORAL | Status: DC
  Administered 2019-01-11 – 2019-01-12 (×2): 40 mg via ORAL
  Filled 2019-01-11 (×2): qty 1

## 2019-01-11 MED ORDER — FUROSEMIDE 10 MG/ML IJ SOLN
40.0000 mg | Freq: Once | INTRAMUSCULAR | Status: AC
  Administered 2019-01-11: 40 mg via INTRAVENOUS
  Filled 2019-01-11: qty 4

## 2019-01-11 MED ORDER — FUROSEMIDE 10 MG/ML IJ SOLN
INTRAMUSCULAR | Status: AC
  Administered 2019-01-11: 40 mg via INTRAVENOUS
  Filled 2019-01-11: qty 4

## 2019-01-11 MED ORDER — POTASSIUM CHLORIDE CRYS ER 20 MEQ PO TBCR: 20.0000 meq | EXTENDED_RELEASE_TABLET | ORAL | Status: DC

## 2019-01-11 MED ORDER — POTASSIUM CHLORIDE 10 MEQ/50ML IV SOLN
10.0000 meq | INTRAVENOUS | Status: AC
  Administered 2019-01-11 (×3): 10 meq via INTRAVENOUS
  Filled 2019-01-11 (×3): qty 50

## 2019-01-11 MED ORDER — CHLORHEXIDINE GLUCONATE 0.12 % MT SOLN
15.0000 mL | Freq: Two times a day (BID) | OROMUCOSAL | Status: DC
  Administered 2019-01-11 – 2019-01-12 (×4): 15 mL via OROMUCOSAL
  Filled 2019-01-11 (×3): qty 15

## 2019-01-11 MED ORDER — ORAL CARE MOUTH RINSE
15.0000 mL | Freq: Two times a day (BID) | OROMUCOSAL | Status: DC
  Administered 2019-01-11 (×2): 15 mL via OROMUCOSAL

## 2019-01-11 MED FILL — Mannitol IV Soln 20%: INTRAVENOUS | Qty: 500 | Status: AC

## 2019-01-11 MED FILL — Sodium Chloride IV Soln 0.9%: INTRAVENOUS | Qty: 2000 | Status: AC

## 2019-01-11 MED FILL — Heparin Sodium (Porcine) Inj 1000 Unit/ML: INTRAMUSCULAR | Qty: 10 | Status: AC

## 2019-01-11 MED FILL — Lidocaine HCl Local Soln Prefilled Syringe 100 MG/5ML (2%): INTRAMUSCULAR | Qty: 5 | Status: AC

## 2019-01-11 MED FILL — Electrolyte-R (PH 7.4) Solution: INTRAVENOUS | Qty: 3000 | Status: AC

## 2019-01-11 NOTE — Progress Notes (Signed)
CT surgery p.m. Rounds  Patient doing well today maintaining O2 saturation 94% and breathing comfortably Lungs with scattered rhonchi Excellent diuresis after IV Lasix dosing earlier today Continue to encourage incentive spirometry, flutter valve for preop history of smoking Maintaining sinus rhythm postop

## 2019-01-11 NOTE — Progress Notes (Signed)
      [address]       [place] [address]             [phone]                 2 Days Post-Op Procedure(s) (LRB): CORONARY ARTERY BYPASS GRAFTING (CABG) X2 USING LEFT INTERNAL MAMMARY ARTERY TO CIRC AND RIGHT GREATER SAPHENOUS VEIN TO LAD. (N/A) Transesophageal Echocardiogram (Tee)   Events: Poor pain control _______________________________________________________________ Vitals: BP 132/68   Pulse 89   Temp (!) 96.9 F (36.1 C) (Axillary)   Resp 18   Ht 5\' 3"  (1.6 m)   Wt 63.4 kg   SpO2 93%   BMI 24.76 kg/m   - Neuro: alert NAD  - Cardiovascular: sinus  Drips: none.    - Pulm: Coarse BS FiO2 (%):  [90 %-100 %] 90 %  ABG    Component Value Date/Time   PHART 7.338 (L) 01/10/2019 2131   PCO2ART 47.7 01/10/2019 2131   PO2ART 48.0 (L) 01/10/2019 2131   HCO3 25.7 01/10/2019 2131   TCO2 27 01/10/2019 2131   ACIDBASEDEF 4.0 (H) 01/10/2019 0113   O2SAT 82.0 01/10/2019 2131    - Abd: soft - Extremity: trace edema  .Intake/Output      10/15 0701 - 10/16 0700 10/16 0701 - 10/17 0700   P.O. 720    I.V. (mL/kg) 269.3 (4.2)    Blood     IV Piggyback 115    Total Intake(mL/kg) 1104.3 (17.4)    Urine (mL/kg/hr) 3135 (2.1)    Blood     Chest Tube 88    Total Output 3223    Net -2118.7            _______________________________________________________________ Labs: CBC Latest Ref Rng & Units 01/11/2019 01/10/2019 01/10/2019  WBC 4.0 - 10.5 K/uL 18.4(H) - -  Hemoglobin 12.0 - 15.0 g/dL 9.7(L) 8.8(L) 9.5(L)  Hematocrit 36.0 - 46.0 % 28.6(L) 26.0(L) 28.0(L)  Platelets 150 - 400 K/uL 144(L) - -   CMP Latest Ref Rng & Units 01/11/2019 01/10/2019 01/10/2019  Glucose 70 - 99 mg/dL 145(H) - -  BUN 6 - 20 mg/dL 14 - -  Creatinine 0.44 - 1.00 mg/dL 0.88 - -  Sodium 135 - 145 mmol/L 140 138 137  Potassium 3.5 - 5.1 mmol/L 3.7 3.9 3.8  Chloride 98 - 111 mmol/L 101 - -  CO2 22 - 32 mmol/L 26 - -  Calcium 8.9 - 10.3 mg/dL 8.3(L) - -  Total  Protein 6.5 - 8.1 g/dL - - -  Total Bilirubin 0.3 - 1.2 mg/dL - - -  Alkaline Phos 38 - 126 U/L - - -  AST 15 - 41 U/L - - -  ALT 0 - 44 U/L - - -    CXR: PV congestion  _______________________________________________________________  Assessment and Plan: POD 1 s/p CABG  Neuro: increasing pain control.  Will add lidocaine patches CV: stable.  On s/a/bb.  Will remove swan and aline today Pulm:diuresis, aggressive pulm toilet Renal: creat stable.  Will continue diuresis GI: will advance diet Heme: stable ID: afebrile Endo: SSI  Dispo: continue ICU care   Melodie Bouillon, MD 01/11/2019 9:48 AM

## 2019-01-11 NOTE — Progress Notes (Signed)
      [address]       [place] [address]             [phone]                 2 Days Post-Op Procedure(s) (LRB): CORONARY ARTERY BYPASS GRAFTING (CABG) X2 USING LEFT INTERNAL MAMMARY ARTERY TO CIRC AND RIGHT GREATER SAPHENOUS VEIN TO LAD. (N/A) Transesophageal Echocardiogram (Tee)   Events: desats yesterday.  Given a total of 120mg  of IV lasix.  Placed on bipap. Doing better _______________________________________________________________ Vitals: BP 132/68   Pulse 87   Temp (!) 96.9 F (36.1 C) (Axillary)   Resp (!) 28   Ht 5\' 3"  (1.6 m)   Wt 63.4 kg   SpO2 94%   BMI 24.76 kg/m   - Neuro: pain improved  - Cardiovascular: sinus  Drips: none.    - Pulm: on bipap.  coarse FiO2 (%):  [90 %-100 %] 90 %  ABG    Component Value Date/Time   PHART 7.338 (L) 01/10/2019 2131   PCO2ART 47.7 01/10/2019 2131   PO2ART 48.0 (L) 01/10/2019 2131   HCO3 25.7 01/10/2019 2131   TCO2 27 01/10/2019 2131   ACIDBASEDEF 4.0 (H) 01/10/2019 0113   O2SAT 82.0 01/10/2019 2131    - Abd: soft - Extremity: trace edema  .Intake/Output      10/15 0701 - 10/16 0700 10/16 0701 - 10/17 0700   P.O. 720    I.V. (mL/kg) 269.3 (4.2)    Blood     IV Piggyback 115    Total Intake(mL/kg) 1104.3 (17.4)    Urine (mL/kg/hr) 3135 (2.1)    Blood     Chest Tube 88    Total Output 3223    Net -2118.7            _______________________________________________________________ Labs: CBC Latest Ref Rng & Units 01/11/2019 01/10/2019 01/10/2019  WBC 4.0 - 10.5 K/uL 18.4(H) - -  Hemoglobin 12.0 - 15.0 g/dL 9.7(L) 8.8(L) 9.5(L)  Hematocrit 36.0 - 46.0 % 28.6(L) 26.0(L) 28.0(L)  Platelets 150 - 400 K/uL 144(L) - -   CMP Latest Ref Rng & Units 01/11/2019 01/10/2019 01/10/2019  Glucose 70 - 99 mg/dL 145(H) - -  BUN 6 - 20 mg/dL 14 - -  Creatinine 0.44 - 1.00 mg/dL 0.88 - -  Sodium 135 - 145 mmol/L 140 138 137  Potassium 3.5 - 5.1 mmol/L 3.7 3.9 3.8  Chloride 98 - 111 mmol/L  101 - -  CO2 22 - 32 mmol/L 26 - -  Calcium 8.9 - 10.3 mg/dL 8.3(L) - -  Total Protein 6.5 - 8.1 g/dL - - -  Total Bilirubin 0.3 - 1.2 mg/dL - - -  Alkaline Phos 38 - 126 U/L - - -  AST 15 - 41 U/L - - -  ALT 0 - 44 U/L - - -    CXR: PV congestion  _______________________________________________________________  Assessment and Plan: POD 2 s/p CABG  Neuro: pain improved CV: stable.  On s/a/bb.  Will remove chest tubes today Pulm: on bipap.  Will continue diuresis.  Will attempt to switch to HF [place] Renal: creat stable.  Will continue diuresis GI: will advance once off bipap Heme: stable ID: afebrile Endo: SSI  Dispo: continue ICU care   Melodie Bouillon, MD 01/11/2019 9:45 AM

## 2019-01-11 NOTE — Discharge Instructions (Signed)

## 2019-01-11 NOTE — Discharge Summary (Signed)
[address]       [place] [address]             [phone]      Physician Discharge Summary  Patient ID: Regina Perry MRN: [id number] DOB/AGE: January 30, 1961 58 y.o.  Admit date: 01/08/2019 Discharge date: 01/16/2019  Admission Diagnoses:  Patient Active Problem List   Diagnosis Date Noted   Unstable angina (HCC) 01/08/2019   Lumbar stenosis with neurogenic claudication 02/06/2017   Bilateral sciatica 05/19/2015   Chest pain at rest 04/09/2012   Hypertension 04/09/2012   Hyperlipidemia 04/09/2012   Tobacco abuse 04/09/2012    Discharge Diagnoses:  Active Problems:   Chest pain at rest   Unstable angina (HCC)   S/P CABG x 2   Discharged Condition: good  HPI:   58 yo female admitted to the hospital following a LHC which showed severe LM disease.  Following the procedure, she had some anginal chest pain, and was placed on nitro gtt.  She states that she had anginal symptoms that woke her from sleep recently.  She also had similar symptoms in January, and with mild exertion.    Hospital Course:   On 01/09/2019 Ms. underwent a coronary bypass graft x2 with Dr. Cliffton Asters. She tolerated the procedure well and was transferred to the cardiac ICU for continued care. She was extubated in a timely manner. POD 1 she had issues with some pain control. We offered lidocaine patches. We discontinued her swan ganz catheter and arterial line. She was not on any drips. We continued a diuretic regimen for fluid overload. We switched her to SSI for glucose control. That evening she was switched to bipap and tolerating it well. She was wheezing less and oxygen saturation was good. Urine output was okay. We replaced the arterial line. POD 2 her pain was better controlled. We removed her chest tubes. We continued diuresis. She remained NPO while on bipap. She remained in the ICU.  On postop day 5 she was transferred to the stepdown unit for continued care.  She had  been waiting for a bed for a day or 2.  She remained on some high flow oxygen therefore we ordered a 6-minute walk test to discern her need for oxygen for home.  Her pain was well-controlled on oral pain medication.  We continued her Xopenex treatments due to some bilateral wheezing found on exam.  She did have some throat pain and likely thrush therefore we started her on some Magic mouthwash.  The patient has chronic respiratory insufficiency with a history of tobacco abuse and bilateral wheezing on exam today requiring xopenex nebulizer treatments. She will need home oxygen due to this diagnosis and due to the patient dropping to 78% on room air. She will need 2L [place] continuous for home. Today, she is ambulating with limited assistance, her incisions are healing well, she will be sent home on 2 L nasal cannula due to desaturating when she ambulates, and she is ready for discharge home.    Consults: None  Significant Diagnostic Studies:   CLINICAL DATA:  Respiratory distress.  Pleural effusion  EXAM: PORTABLE CHEST 1 VIEW  COMPARISON:  Earlier today  FINDINGS: No visible pneumothorax. Interstitial and hazy opacity at the lung bases is stable from earlier today. Stable cardiac enlargement. CABG. Essentially stable chest tube positioning and right IJ sheath.  IMPRESSION: Unchanged edema, atelectasis, and probable layering pleural fluid. No visible pneumothorax.   Electronically Signed   By:  Marnee SpringJonathon  Watts M.D.   On: 01/11/2019 10:40  Treatments:  01/09/2019 Patient:  Regina SanesLisa J Tuch Pre-Op Dx: Marcello FennelLM CAD, unstable angina   Post-op Dx:  same Procedure: CABG X 2.  LIMA LAD, RSVG OM2   Endoscopic greater saphenous vein harvest on the right Intra-operative Transesophageal Echocardiogram  Surgeon and Role:      * Lightfoot, Eliezer LoftsHarrell O, MD - Primary    * T. Asa Lenteonte PA-C - assisting   Anesthesia  general EBL:  400ml Blood Administration: none Xclamp Time:  30 min Pump Time:   58min  Drains: 819 F blake drain: L, mediastinal  Wires: atrial wires Counts: correct   Indications: 58 yo female admitted to the hospital following a LHC which showed severe LM disease. Following the procedure, she had some anginal chest pain, and was placed on nitro gtt. She states that she had anginal symptoms that woke her from sleep recently. She also had similar symptoms in January, and with mild exertion.  Findings: Good conduit, good targets.  Good function on post-CPB TEE  Discharge Exam: Blood pressure 110/63, pulse 88, temperature 98.2 F (36.8 C), temperature source Oral, resp. rate 20, height 5\' 3"  (1.6 m), weight 60.3 kg, SpO2 96 %.    General appearance: alert, cooperative and no distress Heart: regular rate and rhythm, S1, S2 normal, no murmur, click, rub or gallop Lungs: wheezing in all fields Abdomen: soft, non-tender; bowel sounds normal; no masses,  no organomegaly Extremities: extremities normal, atraumatic, no cyanosis or edema Wound: clean and dry  Disposition: Discharge disposition: 01-Home or Self Care       Discharge Instructions    Amb Referral to Cardiac Rehabilitation   Complete by: As directed    [facility]   Diagnosis: CABG   CABG X ___: 2   After initial evaluation and assessments completed: Virtual Based Care may be provided alone or in conjunction with Phase 2 Cardiac Rehab based on patient barriers.: Yes     Allergies as of 01/16/2019      Reactions   Percocet [oxycodone-acetaminophen] Other (See Comments)   Sick on stomach      Medication List    STOP taking these medications   aspirin 81 MG chewable tablet Replaced by: aspirin 325 MG EC tablet   HYDROcodone-acetaminophen 5-325 MG tablet Commonly known as: NORCO/VICODIN   nitroGLYCERIN 0.4 MG SL tablet Commonly known as: NITROSTAT   SUMAtriptan 100 MG tablet Commonly known as: IMITREX   verapamil 180 MG CR tablet Commonly  known as: CALAN-SR     TAKE these medications   ALPRAZolam 0.25 MG tablet Commonly known as: XANAX Take 1 tablet (0.25 mg total) by mouth 2 (two) times daily. What changed:   medication strength  how much to take   aspirin 325 MG EC tablet Take 1 tablet (325 mg total) by mouth daily. Replaces: aspirin 81 MG chewable tablet   cetirizine 10 MG tablet Commonly known as: ZYRTEC Take 10 mg by mouth daily.   D3-1000 25 MCG (1000 UT) capsule Generic drug: Cholecalciferol Take 1,000 Units by mouth daily.   gabapentin 300 MG capsule Commonly known as: NEURONTIN Take 1 capsule (300 mg total) by mouth 3 (three) times daily.   magic mouthwash w/lidocaine Soln Take 5 mLs by mouth 4 (four) times daily as needed for up to 5 days for mouth pain.   metoprolol tartrate 25 MG tablet Commonly known as: LOPRESSOR Take 0.5 tablets (12.5 mg total) by  mouth 2 (two) times daily. What changed: how much to take   nicotine 14 mg/24hr patch Commonly known as: NICODERM CQ - dosed in mg/24 hours Place 1 patch (14 mg total) onto the skin daily.   omeprazole 40 MG capsule Commonly known as: PRILOSEC Take 1 capsule (40 mg total) by mouth daily.   oxyCODONE-acetaminophen 7.5-325 MG tablet Commonly known as: PERCOCET Take 1 tablet by mouth every 4 (four) hours as needed for moderate pain.   promethazine 25 MG tablet Commonly known as: PHENERGAN Take 25 mg every 6 (six) hours as needed by mouth for nausea or vomiting.   rosuvastatin 40 MG tablet Commonly known as: CRESTOR Take 1 tablet (40 mg total) by mouth daily.   Vitamin D (Ergocalciferol) 1.25 MG (50000 UT) Caps capsule Commonly known as: DRISDOL Take 50,000 Units by mouth every 7 (seven) days.            Durable Medical Equipment  (From admission, onward)         Start     Ordered   01/15/19 0916  For home use only DME oxygen  Once    Question Answer Comment  Length of Need 6 Months   Mode or (Route) Nasal cannula     Liters per Minute 2   Frequency Continuous (stationary and portable oxygen unit needed)   Oxygen conserving device Yes   Oxygen delivery system Gas      01/15/19 0915        The patient has been discharged on:   1.Beta Blocker:  Yes [ yes  ]                              No   [   ]                              If No, reason:  2.Ace Inhibitor/ARB: Yes [   ]                                     No  [  no  ]                                     If No, reason: hypotension  3.Statin:   Yes [ yes  ]                  No  [   ]                  If No, reason:  4.Ecasa:  Yes  [ yes  ]                  No   [   ]                  If No, reason:   Follow-up Information    Elisabeth Most, FNP. Call in 1 day(s).   Specialty: Family Medicine Contact information: [address] [place] [address] [phone]        Corliss Skains, MD Follow up.   Specialty: Thoracic Surgery Why: Your routine follow-up appointment is at 12:15 on 01/18/2019. Please bring your hospital paperwork.  Contact information: [address] [place] [address] [phone]        Miquel Dunn, NP Follow up.   Specialty: Cardiology Why: Your follow-up appointment is on 01/18/2019 at 2:00pm. Please birng your hospital paperwork.  Contact information: [place] [address] [phone]           Signed: Elgie Collard 01/16/2019, 1:41 PM

## 2019-01-12 LAB — TYPE AND SCREEN
ABO/RH(D): A POS
Antibody Screen: NEGATIVE
Unit division: 0
Unit division: 0

## 2019-01-12 LAB — BPAM RBC
Blood Product Expiration Date: 202011042359
Blood Product Expiration Date: 202011042359
Unit Type and Rh: 6200
Unit Type and Rh: 6200

## 2019-01-12 LAB — BASIC METABOLIC PANEL
Anion gap: 9 (ref 5–15)
BUN: 17 mg/dL (ref 6–20)
CO2: 30 mmol/L (ref 22–32)
Calcium: 8.6 mg/dL — ABNORMAL LOW (ref 8.9–10.3)
Chloride: 100 mmol/L (ref 98–111)
Creatinine, Ser: 0.86 mg/dL (ref 0.44–1.00)
GFR calc Af Amer: >60 mL/min (ref >60)
GFR calc non Af Amer: >60 mL/min (ref >60)
Glucose, Bld: 118 mg/dL — ABNORMAL HIGH (ref 70–99)
Potassium: 3.8 mmol/L (ref 3.5–5.1)
Sodium: 139 mmol/L (ref 135–145)

## 2019-01-12 LAB — CBC
HCT: 27.8 % — ABNORMAL LOW (ref 36.0–46.0)
Hemoglobin: 9 g/dL — ABNORMAL LOW (ref 12.0–15.0)
MCH: 29.2 pg (ref 26.0–34.0)
MCHC: 32.4 g/dL (ref 30.0–36.0)
MCV: 90.3 fL (ref 80.0–100.0)
Platelets: 162 10*3/uL (ref 150–400)
RBC: 3.08 MIL/uL — ABNORMAL LOW (ref 3.87–5.11)
RDW: 12.9 % (ref 11.5–15.5)
WBC: 17.4 10*3/uL — ABNORMAL HIGH (ref 4.0–10.5)
nRBC: 0 % (ref 0.0–0.2)

## 2019-01-12 LAB — GLUCOSE, CAPILLARY
Glucose-Capillary: 107 mg/dL — ABNORMAL HIGH (ref 70–99)
Glucose-Capillary: 150 mg/dL — ABNORMAL HIGH (ref 70–99)
Glucose-Capillary: 164 mg/dL — ABNORMAL HIGH (ref 70–99)
Glucose-Capillary: 104 mg/dL — ABNORMAL HIGH (ref 70–99)
Glucose-Capillary: 111 mg/dL — ABNORMAL HIGH (ref 70–99)

## 2019-01-12 MED ORDER — INSULIN ASPART 100 UNIT/ML ~~LOC~~ SOLN
0.0000 [IU] | Freq: Three times a day (TID) | SUBCUTANEOUS | Status: DC
  Administered 2019-01-12: 4 [IU] via SUBCUTANEOUS
  Administered 2019-01-13 (×2): 2 [IU] via SUBCUTANEOUS

## 2019-01-12 MED ORDER — GUAIFENESIN ER 600 MG PO TB12
600.0000 mg | ORAL_TABLET | Freq: Two times a day (BID) | ORAL | Status: DC
  Administered 2019-01-12 – 2019-01-16 (×9): 600 mg via ORAL
  Filled 2019-01-12 (×9): qty 1

## 2019-01-12 MED ORDER — ORAL CARE MOUTH RINSE
15.0000 mL | Freq: Two times a day (BID) | OROMUCOSAL | Status: DC
  Administered 2019-01-12 – 2019-01-15 (×5): 15 mL via OROMUCOSAL

## 2019-01-12 MED ORDER — POTASSIUM CHLORIDE 20 MEQ/15ML (10%) PO SOLN: 20.0000 meq | ORAL | Status: DC

## 2019-01-12 MED ORDER — LEVALBUTEROL HCL 0.63 MG/3ML IN NEBU
0.6300 mg | INHALATION_SOLUTION | Freq: Two times a day (BID) | RESPIRATORY_TRACT | Status: DC
  Administered 2019-01-12: 0.63 mg via RESPIRATORY_TRACT

## 2019-01-12 MED ORDER — POTASSIUM CHLORIDE CRYS ER 20 MEQ PO TBCR
20.0000 meq | EXTENDED_RELEASE_TABLET | ORAL | Status: AC
  Administered 2019-01-12 (×3): 20 meq via ORAL
  Filled 2019-01-12 (×3): qty 1

## 2019-01-12 MED ORDER — LEVALBUTEROL HCL 0.63 MG/3ML IN NEBU: 0.6300 mg | INHALATION_SOLUTION | Freq: Four times a day (QID) | RESPIRATORY_TRACT | Status: DC | PRN

## 2019-01-12 MED ORDER — ALPRAZOLAM 0.5 MG PO TABS
0.5000 mg | ORAL_TABLET | Freq: Two times a day (BID) | ORAL | Status: DC
  Administered 2019-01-12 – 2019-01-15 (×7): 0.5 mg via ORAL
  Filled 2019-01-12 (×7): qty 1

## 2019-01-12 MED ORDER — NICOTINE 14 MG/24HR TD PT24
14.0000 mg | MEDICATED_PATCH | Freq: Every day | TRANSDERMAL | Status: DC
  Administered 2019-01-12 – 2019-01-16 (×5): 14 mg via TRANSDERMAL
  Filled 2019-01-12 (×5): qty 1

## 2019-01-12 MED ORDER — FUROSEMIDE 40 MG PO TABS
40.0000 mg | ORAL_TABLET | Freq: Every day | ORAL | Status: DC
  Administered 2019-01-13: 40 mg via ORAL
  Filled 2019-01-12: qty 1

## 2019-01-12 NOTE — Progress Notes (Signed)
CT surgery p.m. Rounds  Patient had good day-high flow oxygen weaned to 2 L Ambulated in hallway Maintaining sinus rhythm Lungs with scattered wheezing-continue inhaled nebulized therapy

## 2019-01-12 NOTE — Progress Notes (Signed)
3 Days Post-Op Procedure(s) (LRB): CORONARY ARTERY BYPASS GRAFTING (CABG) X2 USING LEFT INTERNAL MAMMARY ARTERY TO CIRC AND RIGHT GREATER SAPHENOUS VEIN TO LAD. (N/A) Transesophageal Echocardiogram (Tee) Subjective: Lungs better but still on hi-flow O2 nsr Walked in hall  Objective: Vital signs in last 24 hours: Temp:  [97.4 F (36.3 C)-98.6 F (37 C)] 98.4 F (36.9 C) (10/17 0800) Pulse Rate:  [72-95] 93 (10/17 0923) Cardiac Rhythm: Normal sinus rhythm (10/17 0800) Resp:  [11-44] 21 (10/17 0800) BP: (85-136)/(50-95) 97/62 (10/17 0923) SpO2:  [85 %-97 %] 91 % (10/17 0800) FiO2 (%):  [60 %-80 %] 60 % (10/16 1200) Weight:  [61.9 kg] 61.9 kg (10/17 0600)  Hemodynamic parameters for last 24 hours:    Intake/Output from previous day: 10/16 0701 - 10/17 0700 In: 120 [P.O.:120] Out: 1845 [Urine:1830; Chest Tube:15] Intake/Output this shift: Total I/O In: -  Out: 100 [Urine:100]       Exam    General- alert and comfortable    Neck- no JVD, no cervical adenopathy palpable, no carotid bruit   Lungs- clear without rales, wheezes   Cor- regular rate and rhythm, no murmur , gallop   Abdomen- soft, non-tender   Extremities - warm, non-tender, minimal edema   Neuro- oriented, appropriate, no focal weakness   Lab Results: Recent Labs    01/11/19 0543 01/12/19 0236  WBC 18.4* 17.4*  HGB 9.7* 9.0*  HCT 28.6* 27.8*  PLT 144* 162   BMET:  Recent Labs    01/11/19 0543 01/12/19 0236  NA 140 139  K 3.7 3.8  CL 101 100  CO2 26 30  GLUCOSE 145* 118*  BUN 14 17  CREATININE 0.88 0.86  CALCIUM 8.3* 8.6*    PT/INR:  Recent Labs    01/09/19 1758  LABPROT 16.8*  INR 1.4*   ABG    Component Value Date/Time   PHART 7.338 (L) 01/10/2019 2131   HCO3 25.7 01/10/2019 2131   TCO2 27 01/10/2019 2131   ACIDBASEDEF 4.0 (H) 01/10/2019 0113   O2SAT 82.0 01/10/2019 2131   CBG (last 3)  Recent Labs    01/11/19 2356 01/12/19 0329 01/12/19 0830  GLUCAP 107* 107* 150*     Assessment/Plan: S/P Procedure(s) (LRB): CORONARY ARTERY BYPASS GRAFTING (CABG) X2 USING LEFT INTERNAL MAMMARY ARTERY TO CIRC AND RIGHT GREATER SAPHENOUS VEIN TO LAD. (N/A) Transesophageal Echocardiogram (Tee) Mobilize Diuresis Diabetes control keep in ICU today, wean hi-flow O2   LOS: 4 days    Tharon Aquas Trigt III 01/12/2019

## 2019-01-13 ENCOUNTER — Inpatient Hospital Stay (HOSPITAL_COMMUNITY): Payer: Medicare HMO

## 2019-01-13 LAB — BASIC METABOLIC PANEL
Anion gap: 10 (ref 5–15)
BUN: 12 mg/dL (ref 6–20)
CO2: 30 mmol/L (ref 22–32)
Calcium: 8.5 mg/dL — ABNORMAL LOW (ref 8.9–10.3)
Chloride: 99 mmol/L (ref 98–111)
Creatinine, Ser: 0.73 mg/dL (ref 0.44–1.00)
GFR calc Af Amer: >60 mL/min (ref >60)
GFR calc non Af Amer: >60 mL/min (ref >60)
Glucose, Bld: 106 mg/dL — ABNORMAL HIGH (ref 70–99)
Potassium: 4.1 mmol/L (ref 3.5–5.1)
Sodium: 139 mmol/L (ref 135–145)

## 2019-01-13 LAB — GLUCOSE, CAPILLARY
Glucose-Capillary: 109 mg/dL — ABNORMAL HIGH (ref 70–99)
Glucose-Capillary: 127 mg/dL — ABNORMAL HIGH (ref 70–99)
Glucose-Capillary: 105 mg/dL — ABNORMAL HIGH (ref 70–99)
Glucose-Capillary: 135 mg/dL — ABNORMAL HIGH (ref 70–99)
Glucose-Capillary: 98 mg/dL (ref 70–99)

## 2019-01-13 LAB — CBC
HCT: 28.3 % — ABNORMAL LOW (ref 36.0–46.0)
Hemoglobin: 9.4 g/dL — ABNORMAL LOW (ref 12.0–15.0)
MCH: 29.8 pg (ref 26.0–34.0)
MCHC: 33.2 g/dL (ref 30.0–36.0)
MCV: 89.8 fL (ref 80.0–100.0)
Platelets: 171 10*3/uL (ref 150–400)
RBC: 3.15 MIL/uL — ABNORMAL LOW (ref 3.87–5.11)
RDW: 13 % (ref 11.5–15.5)
WBC: 13.9 10*3/uL — ABNORMAL HIGH (ref 4.0–10.5)
nRBC: 0 % (ref 0.0–0.2)

## 2019-01-13 MED ORDER — FUROSEMIDE 20 MG PO TABS
20.0000 mg | ORAL_TABLET | Freq: Every day | ORAL | Status: DC
  Administered 2019-01-14 – 2019-01-16 (×3): 20 mg via ORAL
  Filled 2019-01-13 (×3): qty 1

## 2019-01-13 NOTE — Progress Notes (Signed)
Patient ambulated to bathroom. While on the commode, this RN assisted with bathing the patient. Patient started to complain of tingling and numbness in left lower extremity from about mid calf to toes. Palpable pulses +2. Vitals remained stable. This RN and a second RN assisted patient from commode to wheelchair and transferred the patient back to bed. Patient then regained sensation in LLE. Patient no longer complaining of symptoms. Will continue to monitor.   Luberta Mutter RN 01/13/19 1645

## 2019-01-13 NOTE — Progress Notes (Signed)
CT surgery p.m. Rounds  Pulmonary status stable, patient walking further every day Waiting for transfer to progressive care unit We will check ultrasound of left leg tomorrow for transient numbness and discomfort after sitting on commode.  Continue Lovenox.

## 2019-01-13 NOTE — Plan of Care (Signed)
Problem: Education: Goal: Knowledge of General Education information will improve Description: Including pain rating scale, medication(s)/side effects and non-pharmacologic comfort measures 01/13/2019 1853 by Alma Friendly, RN Outcome: Progressing 01/13/2019 1853 by Alma Friendly, RN Outcome: Progressing   Problem: Health Behavior/Discharge Planning: Goal: Ability to manage health-related needs will improve 01/13/2019 1853 by Alma Friendly, RN Outcome: Progressing 01/13/2019 1853 by Alma Friendly, RN Outcome: Progressing   Problem: Clinical Measurements: Goal: Ability to maintain clinical measurements within normal limits will improve 01/13/2019 1853 by Alma Friendly, RN Outcome: Progressing 01/13/2019 1853 by Alma Friendly, RN Outcome: Progressing Goal: Will remain free from infection 01/13/2019 1853 by Alma Friendly, RN Outcome: Progressing 01/13/2019 1853 by Alma Friendly, RN Outcome: Progressing Goal: Diagnostic test results will improve 01/13/2019 1853 by Alma Friendly, RN Outcome: Progressing 01/13/2019 1853 by Alma Friendly, RN Outcome: Progressing Goal: Respiratory complications will improve 01/13/2019 1853 by Alma Friendly, RN Outcome: Progressing 01/13/2019 1853 by Alma Friendly, RN Outcome: Progressing Goal: Cardiovascular complication will be avoided 01/13/2019 1853 by Alma Friendly, RN Outcome: Progressing 01/13/2019 1853 by Alma Friendly, RN Outcome: Progressing   Problem: Activity: Goal: Risk for activity intolerance will decrease 01/13/2019 1853 by Alma Friendly, RN Outcome: Progressing 01/13/2019 1853 by Alma Friendly, RN Outcome: Progressing   Problem: Nutrition: Goal: Adequate nutrition will be maintained 01/13/2019 1853 by Alma Friendly, RN Outcome: Progressing 01/13/2019 1853 by Alma Friendly, RN Outcome: Progressing   Problem: Coping: Goal: Level of anxiety will decrease 01/13/2019 1853 by Alma Friendly, RN Outcome:  Progressing 01/13/2019 1853 by Alma Friendly, RN Outcome: Progressing   Problem: Elimination: Goal: Will not experience complications related to bowel motility 01/13/2019 1853 by Alma Friendly, RN Outcome: Progressing 01/13/2019 1853 by Alma Friendly, RN Outcome: Progressing Goal: Will not experience complications related to urinary retention 01/13/2019 1853 by Alma Friendly, RN Outcome: Progressing 01/13/2019 1853 by Alma Friendly, RN Outcome: Progressing   Problem: Pain Managment: Goal: General experience of comfort will improve 01/13/2019 1853 by Alma Friendly, RN Outcome: Progressing 01/13/2019 1853 by Alma Friendly, RN Outcome: Progressing   Problem: Safety: Goal: Ability to remain free from injury will improve 01/13/2019 1853 by Alma Friendly, RN Outcome: Progressing 01/13/2019 1853 by Alma Friendly, RN Outcome: Progressing   Problem: Skin Integrity: Goal: Risk for impaired skin integrity will decrease 01/13/2019 1853 by Alma Friendly, RN Outcome: Progressing 01/13/2019 1853 by Alma Friendly, RN Outcome: Progressing   Problem: Education: Goal: Will demonstrate proper wound care and an understanding of methods to prevent future damage 01/13/2019 1853 by Alma Friendly, RN Outcome: Progressing 01/13/2019 1853 by Alma Friendly, RN Outcome: Progressing Goal: Knowledge of disease or condition will improve 01/13/2019 1853 by Alma Friendly, RN Outcome: Progressing 01/13/2019 1853 by Alma Friendly, RN Outcome: Progressing Goal: Knowledge of the prescribed therapeutic regimen will improve 01/13/2019 1853 by Alma Friendly, RN Outcome: Progressing 01/13/2019 1853 by Alma Friendly, RN Outcome: Progressing Goal: Individualized Educational Video(s) 01/13/2019 1853 by Alma Friendly, RN Outcome: Progressing 01/13/2019 1853 by Alma Friendly, RN Outcome: Progressing   Problem: Activity: Goal: Risk for activity intolerance will decrease 01/13/2019 1853 by Alma Friendly,  RN Outcome: Progressing 01/13/2019 1853 by Alma Friendly, RN Outcome: Progressing   Problem: Cardiac: Goal: Will achieve and/or maintain hemodynamic stability 01/13/2019 1853 by Alma Friendly, RN Outcome: Progressing 01/13/2019 1853  by Tenna Child, RN Outcome: Progressing   Problem: Clinical Measurements: Goal: Postoperative complications will be avoided or minimized 01/13/2019 1853 by Tenna Child, RN Outcome: Progressing 01/13/2019 1853 by Tenna Child, RN Outcome: Progressing   Problem: Respiratory: Goal: Respiratory status will improve 01/13/2019 1853 by Tenna Child, RN Outcome: Progressing 01/13/2019 1853 by Tenna Child, RN Outcome: Progressing   Problem: Skin Integrity: Goal: Wound healing without signs and symptoms of infection 01/13/2019 1853 by Tenna Child, RN Outcome: Progressing 01/13/2019 1853 by Tenna Child, RN Outcome: Progressing Goal: Risk for impaired skin integrity will decrease 01/13/2019 1853 by Tenna Child, RN Outcome: Progressing 01/13/2019 1853 by Tenna Child, RN Outcome: Progressing   Problem: Urinary Elimination: Goal: Ability to achieve and maintain adequate renal perfusion and functioning will improve 01/13/2019 1853 by Tenna Child, RN Outcome: Progressing 01/13/2019 1853 by Tenna Child, RN Outcome: Progressing

## 2019-01-13 NOTE — Progress Notes (Signed)
4 Days Post-Op Procedure(s) (LRB): CORONARY ARTERY BYPASS GRAFTING (CABG) X2 USING LEFT INTERNAL MAMMARY ARTERY TO CIRC AND RIGHT GREATER SAPHENOUS VEIN TO LAD. (N/A) Transesophageal Echocardiogram (Tee) Subjective: Pulmonary status much improved Now on 4L O2, CXR clearing, at preop wt nsr Will tx to stepdown Objective: Vital signs in last 24 hours: Temp:  [97.7 F (36.5 C)-98.7 F (37.1 C)] 98 F (36.7 C) (10/18 0800) Pulse Rate:  [89-109] 98 (10/18 0904) Cardiac Rhythm: Sinus tachycardia (10/18 0800) Resp:  [14-28] 21 (10/18 0800) BP: (88-114)/(44-65) 107/50 (10/18 0904) SpO2:  [86 %-100 %] 92 % (10/18 0800) Weight:  [60.8 kg] 60.8 kg (10/18 0500)  Hemodynamic parameters for last 24 hours:    Intake/Output from previous day: 10/17 0701 - 10/18 0700 In: 672 [P.O.:672] Out: 1500 [Urine:1500] Intake/Output this shift: Total I/O In: 240 [P.O.:240] Out: -        Exam    General- alert and comfortable    Neck- no JVD, no cervical adenopathy palpable, no carotid bruit   Lungs- clear without rales, wheezes   Cor- regular rate and rhythm, no murmur , gallop   Abdomen- soft, non-tender   Extremities - warm, non-tender, minimal edema   Neuro- oriented, appropriate, no focal weakness   Lab Results: Recent Labs    01/12/19 0236 01/13/19 0244  WBC 17.4* 13.9*  HGB 9.0* 9.4*  HCT 27.8* 28.3*  PLT 162 171   BMET:  Recent Labs    01/12/19 0236 01/13/19 0244  NA 139 139  K 3.8 4.1  CL 100 99  CO2 30 30  GLUCOSE 118* 106*  BUN 17 12  CREATININE 0.86 0.73  CALCIUM 8.6* 8.5*    PT/INR: No results for input(s): LABPROT, INR in the last 72 hours. ABG    Component Value Date/Time   PHART 7.338 (L) 01/10/2019 2131   HCO3 25.7 01/10/2019 2131   TCO2 27 01/10/2019 2131   ACIDBASEDEF 4.0 (H) 01/10/2019 0113   O2SAT 82.0 01/10/2019 2131   CBG (last 3)  Recent Labs    01/12/19 2119 01/13/19 0324 01/13/19 0649  GLUCAP 111* 109* 127*    Assessment/Plan: S/P  Procedure(s) (LRB): CORONARY ARTERY BYPASS GRAFTING (CABG) X2 USING LEFT INTERNAL MAMMARY ARTERY TO CIRC AND RIGHT GREATER SAPHENOUS VEIN TO LAD. (N/A) Transesophageal Echocardiogram (Tee) Mobilize Diuresis Plan for transfer to step-down: see transfer orders  Wean O2 as tolerated   LOS: 5 days    Regina Perry 01/13/2019

## 2019-01-14 ENCOUNTER — Inpatient Hospital Stay (HOSPITAL_COMMUNITY): Payer: Medicare HMO

## 2019-01-14 DIAGNOSIS — R202 Paresthesia of skin: Secondary | ICD-10-CM

## 2019-01-14 DIAGNOSIS — M7989 Other specified soft tissue disorders: Secondary | ICD-10-CM

## 2019-01-14 LAB — BASIC METABOLIC PANEL
Anion gap: 11 (ref 5–15)
BUN: 10 mg/dL (ref 6–20)
CO2: 28 mmol/L (ref 22–32)
Calcium: 8.5 mg/dL — ABNORMAL LOW (ref 8.9–10.3)
Chloride: 99 mmol/L (ref 98–111)
Creatinine, Ser: 0.75 mg/dL (ref 0.44–1.00)
GFR calc Af Amer: >60 mL/min (ref >60)
GFR calc non Af Amer: >60 mL/min (ref >60)
Glucose, Bld: 107 mg/dL — ABNORMAL HIGH (ref 70–99)
Potassium: 3.6 mmol/L (ref 3.5–5.1)
Sodium: 138 mmol/L (ref 135–145)

## 2019-01-14 LAB — CBC
HCT: 27 % — ABNORMAL LOW (ref 36.0–46.0)
Hemoglobin: 9.1 g/dL — ABNORMAL LOW (ref 12.0–15.0)
MCH: 30 pg (ref 26.0–34.0)
MCHC: 33.7 g/dL (ref 30.0–36.0)
MCV: 89.1 fL (ref 80.0–100.0)
Platelets: 197 10*3/uL (ref 150–400)
RBC: 3.03 MIL/uL — ABNORMAL LOW (ref 3.87–5.11)
RDW: 12.7 % (ref 11.5–15.5)
WBC: 11.5 10*3/uL — ABNORMAL HIGH (ref 4.0–10.5)
nRBC: 0 % (ref 0.0–0.2)

## 2019-01-14 LAB — GLUCOSE, CAPILLARY
Glucose-Capillary: 117 mg/dL — ABNORMAL HIGH (ref 70–99)
Glucose-Capillary: 96 mg/dL (ref 70–99)
Glucose-Capillary: 106 mg/dL — ABNORMAL HIGH (ref 70–99)
Glucose-Capillary: 108 mg/dL — ABNORMAL HIGH (ref 70–99)

## 2019-01-14 MED ORDER — POTASSIUM CHLORIDE CRYS ER 20 MEQ PO TBCR
20.0000 meq | EXTENDED_RELEASE_TABLET | ORAL | Status: AC
  Administered 2019-01-14 (×3): 20 meq via ORAL
  Filled 2019-01-14 (×3): qty 1

## 2019-01-14 NOTE — Progress Notes (Signed)
      [address]       [place] [address]             [phone]                 5 Days Post-Op Procedure(s) (LRB): CORONARY ARTERY BYPASS GRAFTING (CABG) X2 USING LEFT INTERNAL MAMMARY ARTERY TO CIRC AND RIGHT GREATER SAPHENOUS VEIN TO LAD. (N/A) Transesophageal Echocardiogram (Tee)   Events: Leg weakness has improve.  Decreased O2 _______________________________________________________________ Vitals: BP (!) 102/42   Pulse 94   Temp 97.9 F (36.6 C) (Oral)   Resp 20   Ht 5\' 3"  (1.6 m)   Wt 60.9 kg   SpO2 98%   BMI 23.78 kg/m   - Neuro: alert NAD  - Cardiovascular: RRR  Drips: none.     - Pulm: wet cough.  Comfortable on [place]    ABG    Component Value Date/Time   PHART 7.338 (L) 01/10/2019 2131   PCO2ART 47.7 01/10/2019 2131   PO2ART 48.0 (L) 01/10/2019 2131   HCO3 25.7 01/10/2019 2131   TCO2 27 01/10/2019 2131   ACIDBASEDEF 4.0 (H) 01/10/2019 0113   O2SAT 82.0 01/10/2019 2131    - Abd: non distended - Extremity: trace edema  .Intake/Output      10/18 0701 - 10/19 0700 10/19 0701 - 10/20 0700   P.O. 1440    Total Intake(mL/kg) 1440 (23.6)    Urine (mL/kg/hr) 2900 (2)    Stool     Total Output 2900    Net -1460         Urine Occurrence 2 x       _______________________________________________________________ Labs: CBC Latest Ref Rng & Units 01/14/2019 01/13/2019 01/12/2019  WBC 4.0 - 10.5 K/uL 11.5(H) 13.9(H) 17.4(H)  Hemoglobin 12.0 - 15.0 g/dL 9.1(L) 9.4(L) 9.0(L)  Hematocrit 36.0 - 46.0 % 27.0(L) 28.3(L) 27.8(L)  Platelets 150 - 400 K/uL 197 171 162   CMP Latest Ref Rng & Units 01/14/2019 01/13/2019 01/12/2019  Glucose 70 - 99 mg/dL 107(H) 106(H) 118(H)  BUN 6 - 20 mg/dL 10 12 17   Creatinine 0.44 - 1.00 mg/dL 0.75 0.73 0.86  Sodium 135 - 145 mmol/L 138 139 139  Potassium 3.5 - 5.1 mmol/L 3.6 4.1 3.8  Chloride 98 - 111 mmol/L 99 99 100  CO2 22 - 32 mmol/L 28 30 30   Calcium 8.9 - 10.3 mg/dL 8.5(L) 8.5(L) 8.6(L)  Total  Protein 6.5 - 8.1 g/dL - - -  Total Bilirubin 0.3 - 1.2 mg/dL - - -  Alkaline Phos 38 - 126 U/L - - -  AST 15 - 41 U/L - - -  ALT 0 - 44 U/L - - -    CXR: none  _______________________________________________________________  Assessment and Plan: POD 5 s/p CABG 2.  Doing well  Neuro: pain controlled CV: stable, on A/S/BB Pulm: continue pulm toilet.  On HF [place].  Wean as tolerated Renal: creat stable, will continue diuresis GI: on diet  Heme: stable ID: afebrile Endo: SSI Dispo: ready for floor.   Melodie Bouillon, MD 01/14/2019 9:38 AM

## 2019-01-14 NOTE — Progress Notes (Signed)
01/14/2019 1350 Received pt to 4E-14 from [facility].  Pt is a S/P CABG.  Tele monitor applied and CCMD notified.  Pt without C/O.  Oriented to room, call light and bed.  Call bell in reach. [place]

## 2019-01-14 NOTE — Progress Notes (Signed)
CARDIAC REHAB PHASE I   PRE:  Rate/Rhythm: 98 SR  BP:  Sitting: 102/58      SaO2: 95 3L  MODE:  Ambulation: 470 ft   POST:  Rate/Rhythm: 115 ST  BP:  Sitting: 102/54    SaO2: 92 3L  Pt just transferred to 4E, but agreeable to walk. Pt ambulated 4106ft in hallway standby assist with slow steady gait. Pt c/o some SOB with mask. Pt returned to bed, call bell and phone within reach. Encouraged continued walks, IS  And flutter use. Will continue to follow.  [id number] Rufina Falco, RN BSN 01/14/2019 2:13 PM

## 2019-01-15 LAB — GLUCOSE, CAPILLARY
Glucose-Capillary: 109 mg/dL — ABNORMAL HIGH (ref 70–99)
Glucose-Capillary: 98 mg/dL (ref 70–99)
Glucose-Capillary: 149 mg/dL — ABNORMAL HIGH (ref 70–99)
Glucose-Capillary: 107 mg/dL — ABNORMAL HIGH (ref 70–99)
Glucose-Capillary: 119 mg/dL — ABNORMAL HIGH (ref 70–99)

## 2019-01-15 MED ORDER — MAGIC MOUTHWASH W/LIDOCAINE
5.0000 mL | Freq: Four times a day (QID) | ORAL | Status: DC | PRN
  Administered 2019-01-15: 5 mL via ORAL
  Filled 2019-01-15 (×3): qty 5

## 2019-01-15 MED ORDER — ALPRAZOLAM 0.25 MG PO TABS
0.2500 mg | ORAL_TABLET | Freq: Two times a day (BID) | ORAL | Status: DC
  Administered 2019-01-15 – 2019-01-16 (×2): 0.25 mg via ORAL
  Filled 2019-01-15 (×2): qty 1

## 2019-01-15 NOTE — Progress Notes (Addendum)
      [address]       [place] [address]             [phone]      6 Days Post-Op Procedure(s) (LRB): CORONARY ARTERY BYPASS GRAFTING (CABG) X2 USING LEFT INTERNAL MAMMARY ARTERY TO CIRC AND RIGHT GREATER SAPHENOUS VEIN TO LAD. (N/A) Transesophageal Echocardiogram (Tee) Subjective: Some shortness of breath this morning and pain over her incision.  Objective: Vital signs in last 24 hours: Temp:  [97.9 F (36.6 C)-99.7 F (37.6 C)] 99.7 F (37.6 C) (10/20 0740) Pulse Rate:  [86-101] 99 (10/20 0740) Cardiac Rhythm: Sinus tachycardia (10/19 1900) Resp:  [15-21] 15 (10/20 0622) BP: (98-117)/(58-80) 100/64 (10/20 0622) SpO2:  [89 %-98 %] 89 % (10/20 0740) Weight:  [60.8 kg] 60.8 kg (10/20 0622)     Intake/Output from previous day: 10/19 0701 - 10/20 0700 In: 480 [P.O.:480] Out: 1050 [Urine:1050] Intake/Output this shift: No intake/output data recorded.  General appearance: alert, cooperative and no distress Heart: regular rate and rhythm, S1, S2 normal, no murmur, click, rub or gallop Lungs: wheezing in all fields Abdomen: soft, non-tender; bowel sounds normal; no masses,  no organomegaly Extremities: extremities normal, atraumatic, no cyanosis or edema Wound: clean and dry  Lab Results: Recent Labs    01/13/19 0244 01/14/19 0243  WBC 13.9* 11.5*  HGB 9.4* 9.1*  HCT 28.3* 27.0*  PLT 171 197   BMET:  Recent Labs    01/13/19 0244 01/14/19 0243  NA 139 138  K 4.1 3.6  CL 99 99  CO2 30 28  GLUCOSE 106* 107*  BUN 12 10  CREATININE 0.73 0.75  CALCIUM 8.5* 8.5*    PT/INR: No results for input(s): LABPROT, INR in the last 72 hours. ABG    Component Value Date/Time   PHART 7.338 (L) 01/10/2019 2131   HCO3 25.7 01/10/2019 2131   TCO2 27 01/10/2019 2131   ACIDBASEDEF 4.0 (H) 01/10/2019 0113   O2SAT 82.0 01/10/2019 2131   CBG (last 3)  Recent Labs    01/14/19 1615 01/14/19 2038 01/15/19 0618  GLUCAP 106* 108* 109*     Assessment/Plan: S/P Procedure(s) (LRB): CORONARY ARTERY BYPASS GRAFTING (CABG) X2 USING LEFT INTERNAL MAMMARY ARTERY TO CIRC AND RIGHT GREATER SAPHENOUS VEIN TO LAD. (N/A) Transesophageal Echocardiogram (Tee)   1. CV-HR in the 80s, BP well controlled. Continue asa/statin/bb 2. Pulm-good oxygen saturation on 2L [place]-remains on high flow. Xopenex treatments. Last CXR showed: Similar findings of pulmonary edema, small/trace bilateral effusions and associated bibasilar opacities, left greater than right, likely atelectasis 3. Renal- creatinine 0.75, electrolytes okay 4. H and H 9.1/27.9, expected acute blood loss anemia 5. Endo-good blood glucose control 106/108/109 6. Throat pain/poor oral care, likely thrush-tongue is red and beefy with some white. Will order some magic mouth wash.  Plan: Continue xopenex, incentive spirometer, and flutter valve. OOB to chair. Ambulate in the halls. Down to 2L with oxygen at 91%. Will continue to wean as able. 6 minute walk test to evaluate for home oxygen. Home later today when completed.    LOS: 7 days    Regina Perry 01/15/2019

## 2019-01-15 NOTE — Progress Notes (Signed)
      [address]       [place] [address]             [phone]       Due to her sleepiness and inability to work with cardiac rehab combine with the difficulty of obtaining home oxygen for the patient, I will hold her discharge today. Continue xopenex treatmentS as needed for wheezing, I will decrease her xanax dose to 0.25. Continue to encourage incentive spirometer, flutter valve, and ambulation.   Discharge summary updated with qualifying diagnosis for home oxygen. I will consult case management to assist in setting this up.   Nicholes Rough, PA-C

## 2019-01-15 NOTE — Progress Notes (Signed)
Removed pt from high flow cann @ 2lpm. Pt quickly decreased to 78% r/a.  Regular Nasal cann at 2lpm reapplied. Sat increased to 91%. Will page PA for information. Vital stable.  Jerald Kief, RN

## 2019-01-15 NOTE — Care Management Important Message (Signed)
Important Message  Patient Details  Name: Regina Perry MRN: [id number] Date of Birth: 12/06/1960   Medicare Important Message Given:  Yes     Shelda Altes 01/15/2019, 1:20 PM

## 2019-01-15 NOTE — Progress Notes (Signed)
SATURATION QUALIFICATIONS: (This note is used to comply with regulatory documentation for home oxygen)  Patient Saturations on Room Air at Rest =89%  Patient Saturations on Room Air while Ambulating =85%  Patient Saturations on 1 Liters of oxygen while Ambulating = 94%  Please briefly explain why patient needs home oxygen:  Jerald Kief, RN

## 2019-01-15 NOTE — Progress Notes (Signed)
CARDIAC REHAB PHASE I   Went to ambulate with pt. Pt difficult to arouse, O2 sats 79 on RA. Pt placed on 2L [place], encouraged deep breaths, pts sats rose to 88. Increased O2 to 3L [place]. Nursing student states pt received Xanex earlier and pt admits this makes her drowsy. Will allow pt time to rest, and try to wean sats when pt more awake. RN at bedside during episode.  [id number] Rufina Falco, RN BSN 01/15/2019 9:49 AM

## 2019-01-15 NOTE — Progress Notes (Signed)
CARDIAC REHAB PHASE I   Pt seen ambulating with nursing student. HR 118 ST, O2 sats 94 1L [place]. Encouraged pt to walk twice more today and use IS. Will continue to follow.  [id number] Rufina Falco, RN BSN 01/15/2019 1:31 PM

## 2019-01-16 ENCOUNTER — Inpatient Hospital Stay (HOSPITAL_COMMUNITY): Payer: Medicare HMO

## 2019-01-16 LAB — GLUCOSE, CAPILLARY
Glucose-Capillary: 108 mg/dL — ABNORMAL HIGH (ref 70–99)
Glucose-Capillary: 118 mg/dL — ABNORMAL HIGH (ref 70–99)

## 2019-01-16 MED ORDER — METOPROLOL TARTRATE 25 MG PO TABS: 12.5000 mg | ORAL_TABLET | Freq: Two times a day (BID) | ORAL | 1 refills | Status: DC

## 2019-01-16 MED ORDER — ALPRAZOLAM 0.25 MG PO TABS: 0.2500 mg | ORAL_TABLET | Freq: Two times a day (BID) | ORAL | 0 refills | Status: DC

## 2019-01-16 MED ORDER — OXYCODONE-ACETAMINOPHEN 7.5-325 MG PO TABS: 1.0000 | ORAL_TABLET | ORAL | 0 refills | Status: DC | PRN

## 2019-01-16 MED ORDER — MAGIC MOUTHWASH W/LIDOCAINE: 5.0000 mL | Freq: Four times a day (QID) | ORAL | 0 refills | Status: AC | PRN

## 2019-01-16 MED ORDER — ASPIRIN 325 MG PO TBEC: 325.0000 mg | DELAYED_RELEASE_TABLET | Freq: Every day | ORAL | 0 refills | Status: DC

## 2019-01-16 MED ORDER — NICOTINE 14 MG/24HR TD PT24: 14.0000 mg | MEDICATED_PATCH | Freq: Every day | TRANSDERMAL | 0 refills | Status: DC

## 2019-01-16 NOTE — Progress Notes (Addendum)
CARDIAC REHAB PHASE I   PRE:  Rate/Rhythm: 92 SR  BP:  Sitting: 109/64      SaO2: 96 2L [place]  MODE:  Ambulation: 470 ft   POST:  Rate/Rhythm: 115 ST  BP:  Sitting: 108/72    SaO2: 94 2L [place]   Pt ambulated 474ft in hallway standby assist with steady gait. Pt able to maintain sternal precautions without reminders. Pt educated on importance of shower and monitoring incisions daily. Encouraged continued IS and Flutter use, walks, and sternal precautions. Pt given in-the-tube sheet along with heart healthy diet. Reviewed restrictions and exercise guidelines. Will refer to [place].   [id number] Rufina Falco, RN BSN 01/16/2019 9:44 AM

## 2019-01-16 NOTE — Progress Notes (Signed)
PT provided discharge instructions and education. PT IV removed and intact. Vitals stable. CCMD notified/telebox removed. Pt denies complaints. Pt has all belongings. PT tx via wheelchair to meet ride.  Jerald Kief, RN

## 2019-01-16 NOTE — Progress Notes (Signed)
      [address]       [place] [address]             [phone]      7 Days Post-Op Procedure(s) (LRB): CORONARY ARTERY BYPASS GRAFTING (CABG) X2 USING LEFT INTERNAL MAMMARY ARTERY TO CIRC AND RIGHT GREATER SAPHENOUS VEIN TO LAD. (N/A) Transesophageal Echocardiogram (Tee) Subjective: Feels better this morning. She was given some stool softeners last night and was up a while using the bathroom.  Objective: Vital signs in last 24 hours: Temp:  [97.7 F (36.5 C)-99.7 F (37.6 C)] 98.7 F (37.1 C) (10/21 0530) Pulse Rate:  [87-99] 88 (10/21 0530) Cardiac Rhythm: Normal sinus rhythm (10/20 2020) Resp:  [18-19] 18 (10/21 0530) BP: (95-112)/(46-65) 103/59 (10/21 0530) SpO2:  [89 %-96 %] 92 % (10/21 0530) Weight:  [60.3 kg] 60.3 kg (10/21 0530)     Intake/Output from previous day: No intake/output data recorded. Intake/Output this shift: No intake/output data recorded.  General appearance: alert, cooperative and no distress Heart: regular rate and rhythm, S1, S2 normal, no murmur, click, rub or gallop Lungs: clear to auscultation bilaterally and occassional rhonchi Abdomen: soft, non-tender; bowel sounds normal; no masses,  no organomegaly Extremities: extremities normal, atraumatic, no cyanosis or edema Wound: clean and dry  Lab Results: Recent Labs    01/14/19 0243  WBC 11.5*  HGB 9.1*  HCT 27.0*  PLT 197   BMET:  Recent Labs    01/14/19 0243  NA 138  K 3.6  CL 99  CO2 28  GLUCOSE 107*  BUN 10  CREATININE 0.75  CALCIUM 8.5*    PT/INR: No results for input(s): LABPROT, INR in the last 72 hours. ABG    Component Value Date/Time   PHART 7.338 (L) 01/10/2019 2131   HCO3 25.7 01/10/2019 2131   TCO2 27 01/10/2019 2131   ACIDBASEDEF 4.0 (H) 01/10/2019 0113   O2SAT 82.0 01/10/2019 2131   CBG (last 3)  Recent Labs    01/15/19 1714 01/15/19 2127 01/16/19 0646  GLUCAP 107* 119* 108*    Assessment/Plan: S/P Procedure(s) (LRB):  CORONARY ARTERY BYPASS GRAFTING (CABG) X2 USING LEFT INTERNAL MAMMARY ARTERY TO CIRC AND RIGHT GREATER SAPHENOUS VEIN TO LAD. (N/A) Transesophageal Echocardiogram (Tee)   1. CV-HR in the 80s, BP well controlled. Continue asa/statin/bb 2. Pulm-good oxygen saturation on 2L [place] . Xopenex treatments. Last CXR showed: Similar findings of pulmonary edema, small/trace bilateral effusions and associated bibasilar opacities, left greater than right, likely atelectasis 3. Renal- creatinine 0.75, electrolytes okay 4. H and H 9.1/27.9, expected acute blood loss anemia 5. Endo-good blood glucose control 106/108/109 6. Throat pain/poor oral care, likely thrush-tongue is red and beefy with some white. Will order some magic mouth wash.  Plan: Discharge today as long as home health and home oxygen can be set up. Follow-up with CXR.    LOS: 8 days    Elgie Collard 01/16/2019

## 2019-01-17 ENCOUNTER — Other Ambulatory Visit: Payer: Self-pay

## 2019-01-17 MED ORDER — ROSUVASTATIN CALCIUM 40 MG PO TABS: 40.0000 mg | ORAL_TABLET | Freq: Every day | ORAL | 1 refills | Status: DC

## 2019-01-18 ENCOUNTER — Ambulatory Visit (INDEPENDENT_AMBULATORY_CARE_PROVIDER_SITE_OTHER): Payer: Self-pay | Admitting: Thoracic Surgery (Cardiothoracic Vascular Surgery)

## 2019-01-18 ENCOUNTER — Encounter: Payer: Self-pay | Admitting: Thoracic Surgery (Cardiothoracic Vascular Surgery)

## 2019-01-18 ENCOUNTER — Ambulatory Visit: Payer: Medicare HMO | Admitting: Cardiology

## 2019-01-18 ENCOUNTER — Other Ambulatory Visit: Payer: Self-pay

## 2019-01-18 VITALS — BP 101/65 | HR 92 | Temp 97.5°F | Resp 20 | Ht 63.0 in | Wt 133.6 lb

## 2019-01-18 DIAGNOSIS — Z951 Presence of aortocoronary bypass graft: Secondary | ICD-10-CM

## 2019-01-18 NOTE — Progress Notes (Signed)
      [address]       [place] [address]             [phone]        Regina Perry Medical Record #[id number] Date of Birth: 16-Jan-1961  Referring: Timoteo Gaul, FNP Primary Care: Timoteo Gaul, FNP Primary Cardiologist:No primary care provider on file.  Reason for visit:   follow-up  History of Present Illness:     Regina Perry comes in for her first follow-up appointment following coronary artery bypass surgery.  Overall she is doing well.  She was discharged on home oxygen but states that she is using it for ambulation.  Her appetite is improved.  She has no major complaints.  Physical Exam: BP 101/65 (BP Location: Left Arm)   Pulse 92   Temp (!) 97.5 F (36.4 C) (Skin)   Resp 20   Ht 5\' 3"  (1.6 m)   Wt 133 lb 9.6 oz (60.6 kg)   SpO2 97% Comment: O2 at 2 LPM via [place]  BMI 23.67 kg/m   Alert NAD RRR, no murmur.  Incision clean.  Sternum stable Abdomen soft, NT/ND No peripheral edema   Diagnostic Studies & Laboratory data:     Assessment / Plan:   58 year old female status post coronary artery bypass grafting.  Doing well Follow-up in 1 month.   Regina Perry 01/18/2019 3:47 PM

## 2019-01-19 ENCOUNTER — Encounter (INDEPENDENT_AMBULATORY_CARE_PROVIDER_SITE_OTHER): Payer: Self-pay

## 2019-01-31 ENCOUNTER — Other Ambulatory Visit: Payer: Self-pay

## 2019-01-31 ENCOUNTER — Ambulatory Visit (INDEPENDENT_AMBULATORY_CARE_PROVIDER_SITE_OTHER): Payer: Medicare HMO | Admitting: Cardiology

## 2019-01-31 ENCOUNTER — Encounter: Payer: Self-pay | Admitting: Cardiology

## 2019-01-31 VITALS — BP 115/65 | HR 66 | Temp 98.2°F | Ht 62.5 in | Wt 141.9 lb

## 2019-01-31 DIAGNOSIS — I10 Essential (primary) hypertension: Secondary | ICD-10-CM | POA: Diagnosis not present

## 2019-01-31 DIAGNOSIS — Z87891 Personal history of nicotine dependence: Secondary | ICD-10-CM | POA: Diagnosis not present

## 2019-01-31 DIAGNOSIS — Z951 Presence of aortocoronary bypass graft: Secondary | ICD-10-CM | POA: Diagnosis not present

## 2019-01-31 DIAGNOSIS — I2510 Atherosclerotic heart disease of native coronary artery without angina pectoris: Secondary | ICD-10-CM | POA: Diagnosis not present

## 2019-01-31 MED ORDER — ROSUVASTATIN CALCIUM 40 MG PO TABS: 40.0000 mg | ORAL_TABLET | Freq: Every day | ORAL | 3 refills | Status: DC

## 2019-01-31 MED ORDER — METOPROLOL TARTRATE 25 MG PO TABS: 25.0000 mg | ORAL_TABLET | Freq: Two times a day (BID) | ORAL | 3 refills | Status: DC

## 2019-01-31 NOTE — Progress Notes (Signed)
Primary Physician:  Timoteo Gaul, FNP   Patient ID: Regina Perry, female    DOB: 08-05-1960, 58 y.o.   MRN: [id number]  Subjective:    Chief Complaint  Patient presents with  . Hypertension  . Follow-up    cath    HPI: VIANNA Perry  is a 58 y.o. female  with hypertension, hyperlipidemia, tobacco use disorder, migraines, nonsustained ventricular tachycardia by event monitor in 2017 that was asymptomatic, recently seen for chest pain.   Recently had an episode concerning for angina that awoke her from sleep. She underwent coronary CTA that was suggestive of left main disease. She underwent coronary angiogram on 01/08/19 that showed severe left main disease, hence was evaluated by [facility] and underwent CABG x 2 on 01/09/19 with Dr. Kipp Brood.   Hospital course was complicated with difficulty getting off oxygen therapy and was discharged on home oxygen. Otherwise, she is recovering well. She is still using oxygen most of the day, and mostly only uses while sleeping and with activities. She has noticed some slight bilateral leg swelling. No chest pain.   She reports quitting smoking 1 day prior to hospital admission and has been able to remain abstinent.   Past Medical History:  Diagnosis Date  . Anxiety   . Bronchitis   . Depression   . Diverticulitis   . GERD (gastroesophageal reflux disease)   . H/O cesarean section   . History of hiatal hernia   . History of kidney stones    LEFT KIDNEY 2 STONES JUST WATCHING( [facility])  . Hypercholesteremia   . Hyperplastic colon polyp    12/30/2008  . Hypertension   . Migraine   . 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 Adrian Prows), prescribed verapamil  . Pneumonia   . PONV (postoperative nausea and vomiting)   . 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 ABDOMINAL HYSTERECTOMY     partial  . CESAREAN SECTION    . COLONOSCOPY    . CORONARY ARTERY BYPASS GRAFT N/A 01/09/2019   Procedure: CORONARY ARTERY BYPASS GRAFTING (CABG) X2 USING LEFT INTERNAL MAMMARY ARTERY TO CIRC AND RIGHT GREATER SAPHENOUS VEIN TO LAD.;  Surgeon: Lajuana Matte, MD;  Location: [place];  Service: Open Heart Surgery;  Laterality: N/A;  . ETHMOIDECTOMY Bilateral 06/26/2014   Procedure: BILATERAL ANTERIOR ETHMOIDECTOMY;  Surgeon: Rozetta Nunnery, MD;  Location: [place];  Service: ENT;  Laterality: Bilateral;  . LEFT HEART CATH AND CORONARY ANGIOGRAPHY N/A 01/08/2019   Procedure: LEFT HEART CATH AND CORONARY ANGIOGRAPHY;  Surgeon: Nigel Mormon, MD;  Location: [place] [facility];  Service: Cardiovascular;  Laterality: N/A;  . MAXILLARY ANTROSTOMY Bilateral 06/26/2014   Procedure: BILATERAL MAXILLARY OSTEA ENLARGEMENT;  Surgeon: Rozetta Nunnery, MD;  Location: [place];  Service: ENT;  Laterality: Bilateral;  . NASAL SEPTOPLASTY W/ TURBINOPLASTY Bilateral 06/26/2014   Procedure: BILATERAL NASAL SEPTOPLASTY WITH TURBINATE REDUCTION;  Surgeon: Rozetta Nunnery, MD;  Location: [place];  Service: ENT;  Laterality: Bilateral;  . SHOULDER ARTHROSCOPY W/ ROTATOR CUFF REPAIR  6/15   right  . TEE WITHOUT CARDIOVERSION  01/09/2019   Procedure: Transesophageal Echocardiogram (Tee);  Surgeon: Lajuana Matte, MD;  Location: [place];  Service: Open Heart Surgery;;  . TONSILLECTOMY AND ADENOIDECTOMY    . TUBAL LIGATION      Social History   Socioeconomic History  . Marital status: Married    Spouse name: Not on file  . Number of children: 2  . Years of education: 6312  . Highest education level: Not on file  Occupational History  . Occupation: unemployed  Social Needs  . Financial resource strain: Not on file  . Food insecurity    Worry: Not on file    Inability: Not on file  .  Transportation needs    Medical: Not on file    Non-medical: Not on file  Tobacco Use  . Smoking status: Former Smoker    Packs/day: 0.50    Years: 35.00    Pack years: 17.50    Types: Cigarettes    Start date: 12/31/2018    Quit date: 12/31/2018    Years since quitting: 0.0  . Smokeless tobacco: Never Used  Substance and Sexual Activity  . Alcohol use: No  . Drug use: No  . Sexual activity: Not on file  Lifestyle  . Physical activity    Days per week: Not on file    Minutes per session: Not on file  . Stress: Not on file  Relationships  . Social Musicianconnections    Talks on phone: Not on file    Gets together: Not on file    Attends religious service: Not on file    Active member of club or organization: Not on file    Attends meetings of clubs or organizations: Not on file    Relationship status: Not on file  . Intimate partner violence    Fear of current or ex partner: Not on file    Emotionally abused: Not on file    Physically abused: Not on file    Forced sexual activity: Not on file  Other Topics Concern  . Not on file  Social History Narrative   Patient drinks about 3-4 cups of caffeine daily.    Patient is right handed.     Review of Systems  Constitution: Negative for decreased appetite, malaise/fatigue, weight gain and weight loss.  Eyes: Negative for visual disturbance.  Cardiovascular: Positive for palpitations. Negative for chest pain, claudication, dyspnea on exertion, leg swelling, orthopnea and syncope.  Respiratory: Negative for hemoptysis and wheezing.   Endocrine: Negative for cold intolerance and heat intolerance.  Hematologic/Lymphatic: Does not bruise/bleed easily.  Skin: Negative for nail changes.  Musculoskeletal: Positive for back pain. Negative for muscle weakness and myalgias.  Gastrointestinal: Negative for abdominal pain, change in bowel habit, nausea and vomiting.  Neurological: Negative for difficulty with concentration, dizziness, focal  weakness and headaches.  Psychiatric/Behavioral: Negative for altered mental status and suicidal ideas.  All other systems reviewed and are negative.     Objective:  Blood pressure 115/65, pulse 66, temperature 98.2 F (36.8 C), height 5' 2.5" (1.588 m), weight 141 lb 14.4 oz (64.4 kg), SpO2 99 %. Body mass index is 25.54 kg/m.    Physical Exam  Constitutional: She is oriented to person, place, and time. Vital signs are normal. She appears well-developed and well-nourished.  HENT:  Head: Normocephalic and atraumatic.  Neck: Normal range of motion.  Cardiovascular: Normal rate, regular rhythm, normal heart sounds and intact distal pulses.  Pulmonary/Chest: Effort normal and breath sounds normal. No accessory muscle usage. No respiratory distress.    Abdominal: Soft. Bowel sounds are normal.  Musculoskeletal: Normal range  of motion.  Neurological: She is alert and oriented to person, place, and time.  Skin: Skin is warm and dry.  Vitals reviewed.  Radiology:  Coronary CTA 12/25/18:  1. The left main is short and FFRct cannot be modeled in this region.  2. FFRct in the proximal LAD is mildly abnormal. However modeling demonstrates that opening the left main stenosis results in a significant improvement in flow in the proximal LAD.  3. FFRct demonstrates significant stenoses in the proximal and mid LAD. Based on modeling, opening the LM and proximal stenosis does not normalize flow more distally.  4. Recommend cardiac catheterization. Take note of concern for ostial left main disease.  Laboratory examination:    CMP Latest Ref Rng & Units 01/14/2019 01/13/2019 01/12/2019  Glucose 70 - 99 mg/dL 170(Y) 174(B) 449(Q)  BUN 6 - 20 mg/dL 10 12 17   Creatinine 0.44 - 1.00 mg/dL 7.59 1.63  Sodium 135 - 145 mmol/L 138 139 139  Potassium 3.5 - 5.1 mmol/L 3.6 4.1 3.8  Chloride 98 - 111 mmol/L 99 99 100  CO2 22 - 32 mmol/L 28 30 30   Calcium 8.9 - 10.3 mg/dL 8.46) )  6.5(L)  Total Protein 6.5 - 8.1 g/dL - - -  Total Bilirubin 0.3 - 1.2 mg/dL - - -  Alkaline Phos 38 - 126 U/L - - -  AST 15 - 41 U/L - - -  ALT 0 - 44 U/L - - -   CBC Latest Ref Rng & Units 01/14/2019 01/13/2019 01/12/2019  WBC 4.0 - 10.5 K/uL 11.5(H) 13.9(H) 17.4(H)  Hemoglobin 12.0 - 15.0 g/dL 01/15/2019) 01/14/2019) 7.7(L)  Hematocrit 36.0 - 46.0 % 27.0(L) 28.3(L) 27.8(L)  Platelets 150 - 400 K/uL 197 171 162   Lipid Panel     Component Value Date/Time   CHOL 197 04/10/2012 0414   TRIG 251 (H) 04/10/2012 0414   HDL 42 04/10/2012 0414   CHOLHDL 4.7 04/10/2012 0414   VLDL 50 (H) 04/10/2012 0414   LDLCALC 105 (H) 04/10/2012 0414   HEMOGLOBIN A1C Lab Results  Component Value Date   HGBA1C 6.0 (H) 01/08/2019   MPG 125.5 01/08/2019   TSH No results for input(s): TSH in the last 8760 hours.  PRN Meds:. Medications Discontinued During This Encounter  Medication Reason  . oxyCODONE-acetaminophen (PERCOCET) 7.5-325 MG tablet Error  . rosuvastatin (CRESTOR) 40 MG tablet Reorder  . metoprolol tartrate (LOPRESSOR) 25 MG tablet    Current Meds  Medication Sig  . ALPRAZolam (XANAX) 0.25 MG tablet Take 1 tablet (0.25 mg total) by mouth 2 (two) times daily.  01/10/2019 aspirin EC 325 MG EC tablet Take 1 tablet (325 mg total) by mouth daily.  . cetirizine (ZYRTEC) 10 MG tablet Take 10 mg by mouth daily.  . Cholecalciferol (D3-1000) 25 MCG (1000 UT) capsule Take 1,000 Units by mouth daily.  07-26[birth year]1976 gabapentin (NEURONTIN) 300 MG capsule Take 1 capsule (300 mg total) by mouth 3 (three) times daily.  Marland Kitchen HYDROcodone-acetaminophen (NORCO) 10-325 MG tablet Take 1 tablet by mouth every 6 (six) hours as needed.  . metoprolol tartrate (LOPRESSOR) 25 MG tablet Take 1 tablet (25 mg total) by mouth 2 (two) times daily.  . nicotine (NICODERM CQ - DOSED IN MG/24 HOURS) 14 mg/24hr patch Place 1 patch (14 mg total) onto the skin daily.  Marland Kitchen omeprazole (PRILOSEC) 40 MG capsule Take 1 capsule (40 mg total) by mouth daily.  .  promethazine (PHENERGAN) 25 MG tablet Take 25 mg every 6 (six)  hours as needed by mouth for nausea or vomiting.  . rosuvastatin (CRESTOR) 40 MG tablet Take 1 tablet (40 mg total) by mouth daily.  . Vitamin D, Ergocalciferol, (DRISDOL) 1.25 MG (50000 UT) CAPS capsule Take 50,000 Units by mouth every 7 (seven) days.  . [DISCONTINUED] metoprolol tartrate (LOPRESSOR) 25 MG tablet Take 0.5 tablets (12.5 mg total) by mouth 2 (two) times daily. (Patient taking differently: Take 25 mg by mouth 2 (two) times daily. )  . [DISCONTINUED] oxyCODONE-acetaminophen (PERCOCET) 7.5-325 MG tablet Take 1 tablet by mouth every 4 (four) hours as needed for moderate pain.  . [DISCONTINUED] rosuvastatin (CRESTOR) 40 MG tablet Take 1 tablet (40 mg total) by mouth daily.    Cardiac Studies:   Echo 01/09/2019:  1. Left ventricular ejection fraction, by visual estimation, is 60 to 65%. The left ventricle has normal function. Normal left ventricular size. There is mildly increased left ventricular hypertrophy.  2. Global right ventricle has normal systolic function.The right ventricular size is normal. No increase in right ventricular wall thickness.  3. Left atrial size was normal.  4. Right atrial size was normal.  5. The mitral valve is normal in structure. Trace mitral valve regurgitation. No evidence of mitral stenosis.  6. The tricuspid valve is normal in structure. Tricuspid valve regurgitation is trivial.  7. The aortic valve is normal in structure. Aortic valve regurgitation was not visualized by color flow Doppler. Structurally normal aortic valve, with no evidence of sclerosis or stenosis.  8. The pulmonic valve was normal in structure. Pulmonic valve regurgitation is not visualized by color flow Doppler.  9. Mild plaque invoving the ascending aorta. 10. The inferior vena cava is normal in size with greater than 50% respiratory variability, suggesting right atrial pressure of 3 mmHg.  Cath 01/08/2019:  LM: Mid  calcified 90% stenosis. LAD: Mid LAD focal calfied 60% stenosis after D2 LCx: Normal RCA: Ostial calcification without significant disease  LVEF normal LVEDP mildly elevated at 21 mmHg  Patient reported 3/10 chest pain at the end of the procedure, improved with IV NTG 3 mcg/min  Will admit to stepdown unit with IV NTG drip and seek CVTS consult  Nuclear stress test [07/20/2015]: 1. The resting electrocardiogram demonstrated normal sinus rhythm, normal resting conduction, no resting arrhythmias and normal rest repolarization. The stress electrocardiogram was normal. Patient exercised on Bruce protocol for 8:35 minutes and achieved 10.16 METS. Stress test terminated due to target heart rate (86% MPHR). Stress symptoms included dyspnea, dizziness. The stress test was terminated because of fatigue. 2. Myocardial perfusion imaging is normal. Overall left ventricular systolic function was normal without regional wall motion abnormalities. The left ventricular ejection fraction was 54%.  Echocardiogram 07/21/2015: Left ventricle cavity is normal in size. Mild concentric hypertrophy of the left ventricle. Normal global wall motion. Normal diastolic filling pattern. Calculated EF 56%. Left atrial cavity is normal in size. Trace mitral regurgitation. Trace pulmonic regurgitation.  Assessment:   Atherosclerosis of native coronary artery of native heart without angina pectoris  S/P CABG x 2  Essential hypertension - Plan: EKG 12-Lead  Former tobacco use  EKG 01/31/2019: Normal sinus rhythm at 61 bpm, normal axis, no evidence of ischemia.   Recommendations:   Patient presents for hospital follow-up after undergoing CABG x2 due to severe left main disease noted on recent coronary angiogram.  She has done well postoperatively, no chest pain.  She has started walking for 10 minutes twice a day that she is tolerating well.  Sternotomy incision  is healing well.  She is to see CV surgery next  month for follow-up.  Blood pressure has remained stable.  She has noticed some bilateral leg swelling, noted to have trace edema today.  She was counseled on restricting her salt intake and leg elevation while sitting.  We will see if she has improvement in leg swelling with making these changes, before starting diuretic therapy.  This patient has been walking at home and tolerating this well, I do feel that she is stable from a cardiac standpoint to start cardiac rehab.  We will make referral for this.  She will need to continue to hold off on driving for now.  I would like to see her back in 2 to 3 weeks for close monitoring and also follow-up on her leg edema.  She is on appropriate medical therapy, I will plan to decrease her dose of aspirin at her next office visit to 81 mg and start Plavix 75 mg daily in view of severe CAD.   Toniann Fail, MSN, APRN, FNP-C [facility] [place] Office: [phone] Fax: [phone]

## 2019-02-03 ENCOUNTER — Encounter: Payer: Self-pay | Admitting: Cardiology

## 2019-02-19 ENCOUNTER — Encounter: Payer: Self-pay | Admitting: Cardiology

## 2019-02-19 ENCOUNTER — Ambulatory Visit (INDEPENDENT_AMBULATORY_CARE_PROVIDER_SITE_OTHER): Payer: Medicare HMO | Admitting: Cardiology

## 2019-02-19 ENCOUNTER — Other Ambulatory Visit: Payer: Self-pay

## 2019-02-19 VITALS — BP 137/75 | HR 70 | Ht 62.0 in | Wt 146.5 lb

## 2019-02-19 DIAGNOSIS — Z951 Presence of aortocoronary bypass graft: Secondary | ICD-10-CM

## 2019-02-19 DIAGNOSIS — I2510 Atherosclerotic heart disease of native coronary artery without angina pectoris: Secondary | ICD-10-CM | POA: Diagnosis not present

## 2019-02-19 DIAGNOSIS — Z87891 Personal history of nicotine dependence: Secondary | ICD-10-CM | POA: Diagnosis not present

## 2019-02-19 MED ORDER — ASPIRIN EC 81 MG PO TBEC: 81.0000 mg | DELAYED_RELEASE_TABLET | Freq: Every day | ORAL | 3 refills | Status: DC

## 2019-02-19 MED ORDER — FAMOTIDINE 40 MG PO TABS: 40.0000 mg | ORAL_TABLET | Freq: Every day | ORAL | 3 refills | Status: DC

## 2019-02-19 MED ORDER — CLOPIDOGREL BISULFATE 75 MG PO TABS: 75.0000 mg | ORAL_TABLET | Freq: Every day | ORAL | 3 refills | Status: DC

## 2019-02-19 NOTE — Progress Notes (Signed)
Primary Physician:  Elisabeth Most, FNP   Patient ID: Regina Perry, female    DOB: 10-Feb-1961, 58 y.o.   MRN: [id number]  Subjective:    Chief Complaint  Patient presents with  . Hypertension  . Edema    HPI: Regina Perry  is a 58 y.o. female  with hypertension, hyperlipidemia, tobacco use disorder, migraines, nonsustained ventricular tachycardia by event monitor in 2017 that was asymptomatic, underwent coronary angiogram on 01/08/19 that showed severe left main disease, hence was evaluated by [facility] and underwent CABG x 2 on 01/09/19 with Dr. Cliffton Asters.   She was last seen 2 weeks ago. She has continued to recover well, able to tolerate more activities. She has not yet started cardiac rehab. She was discharged on oxygen. Admits to now rarely using during the day as her oxygen level ranges from 96-99%. She has even missed using it at night a few times. Denies any dyspnea. Sternotomy site has healed well, does note one area that is slightly open, but not drainage. She is to see Dr. Cliffton Asters on 12/04. Previously reported leg swelling has improved with changing her diet.  She reports quitting smoking 1 day prior to hospital admission and has been able to remain abstinent.   Past Medical History:  Diagnosis Date  . Anxiety   . Bronchitis   . Depression   . Diverticulitis   . GERD (gastroesophageal reflux disease)   . H/O cesarean section   . History of hiatal hernia   . History of kidney stones    LEFT KIDNEY 2 STONES JUST WATCHING( [facility])  . Hypercholesteremia   . Hyperplastic colon polyp    12/30/2008  . Hypertension   . Migraine   . 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 Yates Decamp), prescribed verapamil  . Pneumonia   . PONV (postoperative nausea and vomiting)   . 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 ABDOMINAL HYSTERECTOMY     partial  . CESAREAN SECTION    . COLONOSCOPY    . CORONARY ARTERY BYPASS GRAFT N/A 01/09/2019   Procedure: CORONARY ARTERY BYPASS GRAFTING (CABG) X2 USING LEFT INTERNAL MAMMARY ARTERY TO CIRC AND RIGHT GREATER SAPHENOUS VEIN TO LAD.;  Surgeon: Corliss Skains, MD;  Location: [facility] OR;  Service: Open Heart Surgery;  Laterality: N/A;  . CORONARY ARTERY BYPASS GRAFT    . ETHMOIDECTOMY Bilateral 06/26/2014   Procedure: BILATERAL ANTERIOR ETHMOIDECTOMY;  Surgeon: Drema Halon, MD;  Location: [facility];  Service: ENT;  Laterality: Bilateral;  . LEFT HEART CATH AND CORONARY ANGIOGRAPHY N/A 01/08/2019   Procedure: LEFT HEART CATH AND CORONARY ANGIOGRAPHY;  Surgeon: Elder Negus, MD;  Location: [facility];  Service: Cardiovascular;  Laterality: N/A;  . MAXILLARY ANTROSTOMY Bilateral 06/26/2014   Procedure: BILATERAL MAXILLARY OSTEA ENLARGEMENT;  Surgeon: Drema Halon, MD;  Location: [facility];  Service: ENT;  Laterality: Bilateral;  . NASAL SEPTOPLASTY W/ TURBINOPLASTY Bilateral 06/26/2014   Procedure: BILATERAL NASAL SEPTOPLASTY WITH TURBINATE REDUCTION;  Surgeon: Drema Halon, MD;  Location: [facility];  Service: ENT;  Laterality: Bilateral;  . SHOULDER ARTHROSCOPY W/ ROTATOR CUFF REPAIR  6/15   right  . TEE WITHOUT CARDIOVERSION  01/09/2019  Procedure: Transesophageal Echocardiogram (Tee);  Surgeon: Corliss SkainsLightfoot, Harrell O, MD;  [facility];  Service: Open Heart Surgery;;  . TONSILLECTOMY AND ADENOIDECTOMY    . TUBAL LIGATION      Social History   Socioeconomic History  . Marital status: Married    Spouse name: Not on file  . Number of children: 2  . Years of education: 2812  . Highest education level: Not on file  Occupational History  . Occupation: unemployed  Social Needs  . Financial resource strain: Not on file  . Food insecurity    Worry:  Not on file    Inability: Not on file  . Transportation needs    Medical: Not on file    Non-medical: Not on file  Tobacco Use  . Smoking status: Former Smoker    Packs/day: 0.50    Years: 35.00    Pack years: 17.50    Types: Cigarettes    Start date: 12/31/2018    Quit date: 12/31/2018    Years since quitting: 0.1  . Smokeless tobacco: Never Used  Substance and Sexual Activity  . Alcohol use: No  . Drug use: No  . Sexual activity: Not on file  Lifestyle  . Physical activity    Days per week: Not on file    Minutes per session: Not on file  . Stress: Not on file  Relationships  . Social Musicianconnections    Talks on phone: Not on file    Gets together: Not on file    Attends religious service: Not on file    Active member of club or organization: Not on file    Attends meetings of clubs or organizations: Not on file    Relationship status: Not on file  . Intimate partner violence    Fear of current or ex partner: Not on file    Emotionally abused: Not on file    Physically abused: Not on file    Forced sexual activity: Not on file  Other Topics Concern  . Not on file  Social History Narrative   Patient drinks about 3-4 cups of caffeine daily.    Patient is right handed.     Review of Systems  Constitution: Negative for decreased appetite, malaise/fatigue, weight gain and weight loss.  Eyes: Negative for visual disturbance.  Cardiovascular: Positive for palpitations. Negative for chest pain, claudication, dyspnea on exertion, leg swelling, orthopnea and syncope.  Respiratory: Negative for hemoptysis and wheezing.   Endocrine: Negative for cold intolerance and heat intolerance.  Hematologic/Lymphatic: Does not bruise/bleed easily.  Skin: Negative for nail changes.  Musculoskeletal: Positive for back pain. Negative for muscle weakness and myalgias.  Gastrointestinal: Negative for abdominal pain, change in bowel habit, nausea and vomiting.  Neurological: Negative for  difficulty with concentration, dizziness, focal weakness and headaches.  Psychiatric/Behavioral: Negative for altered mental status and suicidal ideas.  All other systems reviewed and are negative.     Objective:  Blood pressure 137/75, pulse 70, height 5\' 2"  (1.575 m), weight 146 lb 8 oz (66.5 kg), SpO2 100 %. Body mass index is 26.8 kg/m.    Physical Exam  Constitutional: She is oriented to person, place, and time. Vital signs are normal. She appears well-developed and well-nourished.  HENT:  Head: Normocephalic and atraumatic.  Neck: Normal range of motion.  Cardiovascular: Normal rate, regular rhythm, normal heart sounds and intact distal pulses.  Pulmonary/Chest: Effort normal and breath sounds normal. No accessory muscle usage. No respiratory distress.  Sternotomy/CABG scar  Abdominal: Soft. Bowel sounds are normal.  Musculoskeletal: Normal range of motion.  Neurological: She is alert and oriented to person, place, and time.  Skin: Skin is warm and dry.  Vitals reviewed.  Radiology:  Coronary CTA 12/25/18:  1. The left main is short and FFRct cannot be modeled in this region.  2. FFRct in the proximal LAD is mildly abnormal. However modeling demonstrates that opening the left main stenosis results in a significant improvement in flow in the proximal LAD.  3. FFRct demonstrates significant stenoses in the proximal and mid LAD. Based on modeling, opening the LM and proximal stenosis does not normalize flow more distally.  4. Recommend cardiac catheterization. Take note of concern for ostial left main disease.  Laboratory examination:    CMP Latest Ref Rng & Units 01/14/2019 01/13/2019 01/12/2019  Glucose 70 - 99 mg/dL 098(J) 191(Y) 782(N)  BUN 6 - 20 mg/dL Creatinine 0.44 - 1.00 mg/dL 5.62 1.30 8.65  Sodium 135 - 145 mmol/L 138 139 139  Potassium 3.5 - 5.1 mmol/L 3.6 4.1 3.8  Chloride 98 - 111 mmol/L 99 99 100  CO2 22 - 32 mmol/L Calcium 8.9 - 10.3 mg/dL 7.8(I) 6.9(G) 2.9(B)  Total Protein 6.5 - 8.1 g/dL - - -  Total Bilirubin 0.3 - 1.2 mg/dL - - -  Alkaline Phos 38 - 126 U/L - - -  AST 15 - 41 U/L - - -  ALT 0 - 44 U/L - - -   CBC Latest Ref Rng & Units 01/14/2019 01/13/2019 01/12/2019  WBC 4.0 - 10.5 K/uL 11.5(H) 13.9(H) 17.4(H)  Hemoglobin 12.0 - 15.0 g/dL 2.8(U) 1.3(K) 4.4(W)  Hematocrit 36.0 - 46.0 % 27.0(L) 28.3(L) 27.8(L)  Platelets 150 - 400 K/uL 197 171 162   Lipid Panel     Component Value Date/Time   CHOL 197 04/10/2012 0414   TRIG 251 (H) 04/10/2012 0414   HDL 42 04/10/2012 0414   CHOLHDL 4.7 04/10/2012 0414   VLDL 50 (H) 04/10/2012 0414   LDLCALC 105 (H) 04/10/2012 0414   HEMOGLOBIN A1C Lab Results  Component Value Date   HGBA1C 6.0 (H) 01/08/2019   MPG 125.5 01/08/2019   TSH No results for input(s): TSH in the last 8760 hours.  PRN Meds:. Medications Discontinued During This Encounter  Medication Reason  . aspirin EC 325 MG EC tablet Discontinued by provider  . omeprazole (PRILOSEC) 40 MG capsule Discontinued by provider   Current Meds  Medication Sig  . ALPRAZolam (XANAX) 0.25 MG tablet Take 1 tablet (0.25 mg total) by mouth 2 (two) times daily.  . cetirizine (ZYRTEC) 10 MG tablet Take 10 mg by mouth daily.  . Cholecalciferol (D3-1000) 25 MCG (1000 UT) capsule Take 1,000 Units by mouth daily.  Marland Kitchen gabapentin (NEURONTIN) 300 MG capsule Take 1 capsule (300 mg total) by mouth 3 (three) times daily.  Marland Kitchen HYDROcodone-acetaminophen (NORCO) 10-325 MG tablet Take 1 tablet by mouth every 6 (six) hours as needed.  . metoprolol tartrate (LOPRESSOR) 25 MG tablet Take 1 tablet (25 mg total) by mouth 2 (two) times daily.  . nicotine (NICODERM CQ - DOSED IN MG/24 HOURS) 14 mg/24hr patch Place 1 patch (14 mg total) onto the skin daily.  . promethazine (PHENERGAN) 25 MG tablet Take 25 mg every 6 (six) hours as needed by mouth for nausea or vomiting.  . rosuvastatin (CRESTOR) 40 MG tablet Take 1  tablet (40 mg total) by mouth  daily.  . SUMAtriptan (IMITREX) 100 MG tablet Take 100 mg by mouth every 2 (two) hours as needed for migraine. May repeat in 2 hours if headache persists or recurs.  . Vitamin D, Ergocalciferol, (DRISDOL) 1.25 MG (50000 UT) CAPS capsule Take 50,000 Units by mouth every 7 (seven) days.  . [DISCONTINUED] aspirin EC 325 MG EC tablet Take 1 tablet (325 mg total) by mouth daily.  . [DISCONTINUED] omeprazole (PRILOSEC) 40 MG capsule Take 1 capsule (40 mg total) by mouth daily.    Cardiac Studies:   Echo 01/09/2019:  1. Left ventricular ejection fraction, by visual estimation, is 60 to 65%. The left ventricle has normal function. Normal left ventricular size. There is mildly increased left ventricular hypertrophy.  2. Global right ventricle has normal systolic function.The right ventricular size is normal. No increase in right ventricular wall thickness.  3. Left atrial size was normal.  4. Right atrial size was normal.  5. The mitral valve is normal in structure. Trace mitral valve regurgitation. No evidence of mitral stenosis.  6. The tricuspid valve is normal in structure. Tricuspid valve regurgitation is trivial.  7. The aortic valve is normal in structure. Aortic valve regurgitation was not visualized by color flow Doppler. Structurally normal aortic valve, with no evidence of sclerosis or stenosis.  8. The pulmonic valve was normal in structure. Pulmonic valve regurgitation is not visualized by color flow Doppler.  9. Mild plaque invoving the ascending aorta. 10. The inferior vena cava is normal in size with greater than 50% respiratory variability, suggesting right atrial pressure of 3 mmHg.  Cath 01/08/2019:  LM: Mid calcified 90% stenosis. LAD: Mid LAD focal calfied 60% stenosis after D2 LCx: Normal RCA: Ostial calcification without significant disease  LVEF normal LVEDP mildly elevated at 21 mmHg  Patient reported 3/10 chest pain at the end of the  procedure, improved with IV NTG 3 mcg/min  Will admit to stepdown unit with IV NTG drip and seek CVTS consult  Nuclear stress test [07/20/2015]: 1. The resting electrocardiogram demonstrated normal sinus rhythm, normal resting conduction, no resting arrhythmias and normal rest repolarization. The stress electrocardiogram was normal. Patient exercised on Bruce protocol for 8:35 minutes and achieved 10.16 METS. Stress test terminated due to target heart rate (86% MPHR). Stress symptoms included dyspnea, dizziness. The stress test was terminated because of fatigue. 2. Myocardial perfusion imaging is normal. Overall left ventricular systolic function was normal without regional wall motion abnormalities. The left ventricular ejection fraction was 54%.   Assessment:   Atherosclerosis of native coronary artery of native heart without angina pectoris  S/P CABG x 2  Former tobacco use  EKG 01/31/2019: Normal sinus rhythm at 61 bpm, normal axis, no evidence of ischemia.   Recommendations:   Patient has continued to recover well post CABGx2. As she has not been using her oxygen consistently and oxygen levels have been stable without any dyspnea, will discontinue her oxygen. I will follow up on her previously placed referral to cardiac rehab. Sternotomy site is healing well without any complications, will also follow up with CV surgery in the next few weeks. Continue to avoid driving and heavy lifting.   In view of severe disease, feel that she would benefit from being on Plavix. Will discontinue ASA 325 mg and start Plavix 75 mg daily and ASA 81 mg daily. Will change omeprazole to Pepcid 40 mg daily in view of interaction with Plavix. Otherwise, she is on appropriate medical therapy. I do not have recent  lipid panel, will obtain from PCP office and if not performed recently will obtain this prior to next appt.  I have congratulated her on remaining abstinent from tobacco use and encouraged her to  continue. I will plan to see her back in 3 months or sooner if needed.    *I have discussed this case with Dr. Jacinto Halim and he personally examined the patient and participated in formulating the plan.*    Toniann Fail, MSN, APRN, FNP-C [facility] Office: [phone] Fax: [phone]

## 2019-02-28 ENCOUNTER — Other Ambulatory Visit: Payer: Self-pay | Admitting: Thoracic Surgery (Cardiothoracic Vascular Surgery)

## 2019-02-28 DIAGNOSIS — Z951 Presence of aortocoronary bypass graft: Secondary | ICD-10-CM

## 2019-03-01 ENCOUNTER — Encounter: Payer: Self-pay | Admitting: Thoracic Surgery (Cardiothoracic Vascular Surgery)

## 2019-03-01 ENCOUNTER — Ambulatory Visit
Admission: RE | Admit: 2019-03-01 | Discharge: 2019-03-01 | Disposition: A | Discharge status: Home / Self Care | Payer: Medicare HMO | Source: Ambulatory Visit | Attending: Thoracic Surgery (Cardiothoracic Vascular Surgery) | Admitting: Thoracic Surgery (Cardiothoracic Vascular Surgery)

## 2019-03-01 ENCOUNTER — Telehealth (HOSPITAL_COMMUNITY): Payer: Self-pay

## 2019-03-01 ENCOUNTER — Other Ambulatory Visit: Payer: Self-pay

## 2019-03-01 ENCOUNTER — Ambulatory Visit (INDEPENDENT_AMBULATORY_CARE_PROVIDER_SITE_OTHER): Payer: Self-pay | Admitting: Thoracic Surgery (Cardiothoracic Vascular Surgery)

## 2019-03-01 VITALS — BP 131/81 | HR 78 | Temp 97.7°F | Resp 20 | Ht 62.5 in | Wt 143.0 lb

## 2019-03-01 DIAGNOSIS — Z951 Presence of aortocoronary bypass graft: Secondary | ICD-10-CM

## 2019-03-01 DIAGNOSIS — I2510 Atherosclerotic heart disease of native coronary artery without angina pectoris: Secondary | ICD-10-CM

## 2019-03-01 NOTE — Telephone Encounter (Signed)
Someone from [facility] called stating that a referral was in for pt from an outside referral and stated that they could place a referral internal but it has to be from a MD and it was from a NP so I couldn't use that referral but there is a referral in from Dr. Kipp Brood the outside referral will send a message to our nurse navigator for her to look at pt referral.

## 2019-03-01 NOTE — Progress Notes (Signed)
      [address]       [place] [address]             [phone]        Wynona J Felkins [place] Medical Record #[id number] Date of Birth: 06-24-1960  Referring: Timoteo Gaul, FNP Primary Care: Timoteo Gaul, FNP Primary Cardiologist:No primary care provider on file.  Reason for visit:   follow-up  History of Present Illness:     Mrs. Blanda presents for her 2nd follow-up.  She has done very well.  She has no complaints  Physical Exam: BP 131/81 (BP Location: Right Arm)   Pulse 78   Temp 97.7 F (36.5 C) (Skin)   Resp 20   Ht 5' 2.5" (1.588 m)   Wt 143 lb (64.9 kg)   SpO2 97% Comment: RA  BMI 25.74 kg/m   Alert NAD Incision clean.  Sternum stable Abdomen soft, ND no peripheral edema   Diagnostic Studies & Laboratory data: CXR: clear     Assessment / Plan:   S/p CABG, doing well Clear for cardiac rehab   Lajuana Matte 03/01/2019 4:01 PM

## 2019-03-07 ENCOUNTER — Telehealth: Payer: Self-pay

## 2019-03-07 ENCOUNTER — Other Ambulatory Visit: Payer: Self-pay

## 2019-03-07 MED ORDER — PANTOPRAZOLE SODIUM 20 MG PO TBEC: 20.0000 mg | DELAYED_RELEASE_TABLET | Freq: Every day | ORAL | 1 refills | Status: DC

## 2019-03-07 NOTE — Telephone Encounter (Signed)
Pt called stating that you started her on plavix and they had to switch her omeprazole to Pepcid which is not working. She is having acid reflux every night and it feels like she is going to vomit. Please advise.//ah

## 2019-03-07 NOTE — Telephone Encounter (Signed)
Can do pantoprazole 20 mg daily

## 2019-03-07 NOTE — Telephone Encounter (Signed)
LMOM advising of change and new rx sent to pharmacy.//ah

## 2019-03-15 ENCOUNTER — Other Ambulatory Visit: Payer: Self-pay

## 2019-03-15 DIAGNOSIS — I2510 Atherosclerotic heart disease of native coronary artery without angina pectoris: Secondary | ICD-10-CM

## 2019-03-15 MED ORDER — ROSUVASTATIN CALCIUM 40 MG PO TABS: 40.0000 mg | ORAL_TABLET | Freq: Every day | ORAL | 3 refills | Status: DC

## 2019-03-15 MED ORDER — CLOPIDOGREL BISULFATE 75 MG PO TABS: 75.0000 mg | ORAL_TABLET | Freq: Every day | ORAL | 3 refills | Status: DC

## 2019-03-15 MED ORDER — PANTOPRAZOLE SODIUM 20 MG PO TBEC: 20.0000 mg | DELAYED_RELEASE_TABLET | Freq: Every day | ORAL | 1 refills | Status: DC

## 2019-04-15 ENCOUNTER — Telehealth: Payer: Self-pay | Admitting: Cardiology

## 2019-04-16 ENCOUNTER — Encounter (HOSPITAL_COMMUNITY): Payer: Self-pay | Admitting: *Deleted

## 2019-04-16 NOTE — Progress Notes (Signed)
Received updated referral for this pt to participate in Cardiac rehab here at [facility] as opposed to [facility] with the diagnosis of CABG x 2 ib 01/09/2019.  Covid risk Score 2.  Reviewed pt follow up appt with Dr. Jacinto Halim and Dr. Cliffton Asters.  Pt is making great progress and is ready to schedule. Will forward to support staff to contact pt and schedule for Virtual Cardiac rehab. [place] Aly, BSN Cardiac and Emergency planning/management officer

## 2019-04-22 ENCOUNTER — Encounter: Payer: Self-pay | Admitting: Cardiology

## 2019-04-22 ENCOUNTER — Ambulatory Visit: Payer: Medicare HMO | Admitting: Cardiology

## 2019-04-22 ENCOUNTER — Other Ambulatory Visit: Payer: Self-pay

## 2019-04-22 VITALS — BP 138/64 | HR 62 | Temp 97.2°F | Resp 16 | Ht 62.5 in | Wt 140.6 lb

## 2019-04-22 DIAGNOSIS — E782 Mixed hyperlipidemia: Secondary | ICD-10-CM | POA: Diagnosis not present

## 2019-04-22 DIAGNOSIS — I2510 Atherosclerotic heart disease of native coronary artery without angina pectoris: Secondary | ICD-10-CM | POA: Diagnosis not present

## 2019-04-22 DIAGNOSIS — I10 Essential (primary) hypertension: Secondary | ICD-10-CM

## 2019-04-22 DIAGNOSIS — F17210 Nicotine dependence, cigarettes, uncomplicated: Secondary | ICD-10-CM | POA: Diagnosis not present

## 2019-04-22 DIAGNOSIS — Z87891 Personal history of nicotine dependence: Secondary | ICD-10-CM

## 2019-04-22 DIAGNOSIS — Z951 Presence of aortocoronary bypass graft: Secondary | ICD-10-CM

## 2019-04-22 MED ORDER — PANTOPRAZOLE SODIUM 20 MG PO TBEC: 20.0000 mg | DELAYED_RELEASE_TABLET | Freq: Every day | ORAL | 3 refills | Status: DC

## 2019-04-22 NOTE — Progress Notes (Signed)
Primary Physician:  Elisabeth Most, FNP   Patient ID: Regina Perry, female    DOB: 06/30/1960, 59 y.o.   MRN: [id number]  Subjective:    Chief Complaint  Patient presents with  . Coronary Artery Disease    2 month FU appt    HPI: Regina Perry  is a 59 y.o. female  with hypertension, hyperlipidemia, tobacco use disorder, migraines, nonsustained ventricular tachycardia by event monitor in 2017 that was asymptomatic, underwent coronary angiogram on 01/08/19 that showed severe left main disease, hence was evaluated by [facility] and underwent CABG x 2 on 01/09/19 with Dr. Cliffton Asters.   She is back to all her normal activities and feels well. No complaints today. No chest pain. She has not yet started cardiac rehab. She goes up and down her stairs for exercise.  Developed some depression and that has now improved since being on medication. She has smoked 4 cigarettes over the last 1 month.   Past Medical History:  Diagnosis Date  . Anxiety   . Bronchitis   . Depression   . Diverticulitis   . GERD (gastroesophageal reflux disease)   . H/O cesarean section   . History of hiatal hernia   . History of kidney stones    LEFT KIDNEY 2 STONES JUST WATCHING( [facility])  . Hypercholesteremia   . Hyperplastic colon polyp    12/30/2008  . Hypertension   . Migraine   . 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 Yates Decamp), prescribed verapamil  . Pneumonia   . PONV (postoperative nausea and vomiting)   . 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   CORONARY ARTER(CABG) X2  LIMA to LAD and SVG TO OM1 01/09/19     Past Surgical History:  Procedure Laterality Date  . ABDOMINAL HYSTERECTOMY     partial  . CESAREAN SECTION    . COLONOSCOPY    . CORONARY ARTERY BYPASS GRAFT N/A 01/09/2019   Procedure: CORONARY ARTERY BYPASS GRAFTING (CABG) X2 USING LEFT INTERNAL MAMMARY ARTERY TO CIRC AND RIGHT GREATER SAPHENOUS VEIN TO LAD.;   Surgeon: Corliss Skains, MD;  Location: [facility] OR;  Service: Open Heart Surgery;  Laterality: N/A;  . CORONARY ARTERY BYPASS GRAFT    . ETHMOIDECTOMY Bilateral 06/26/2014   Procedure: BILATERAL ANTERIOR ETHMOIDECTOMY;  Surgeon: Drema Halon, MD;  Location: [facility];  Service: ENT;  Laterality: Bilateral;  . LEFT HEART CATH AND CORONARY ANGIOGRAPHY N/A 01/08/2019   Procedure: LEFT HEART CATH AND CORONARY ANGIOGRAPHY;  Surgeon: Elder Negus, MD;  Location: [facility];  Service: Cardiovascular;  Laterality: N/A;  . MAXILLARY ANTROSTOMY Bilateral 06/26/2014   Procedure: BILATERAL MAXILLARY OSTEA ENLARGEMENT;  Surgeon: Drema Halon, MD;  Location: [facility];  Service: ENT;  Laterality: Bilateral;  . NASAL SEPTOPLASTY W/ TURBINOPLASTY Bilateral 06/26/2014   Procedure: BILATERAL NASAL SEPTOPLASTY WITH TURBINATE REDUCTION;  Surgeon: Drema Halon, MD;  Location: [facility];  Service: ENT;  Laterality: Bilateral;  . SHOULDER ARTHROSCOPY W/ ROTATOR CUFF REPAIR  6/15   right  . TEE WITHOUT CARDIOVERSION  01/09/2019   Procedure: Transesophageal Echocardiogram (Tee);  Surgeon: Corliss Skains, MD;  Location: [facility] OR;  Service: Open Heart Surgery;;  . TONSILLECTOMY AND ADENOIDECTOMY    . TUBAL LIGATION      Social History   Socioeconomic History  . Marital status: Married  Spouse name: Not on file  . Number of children: 2  . Years of education: 12  . Highest education level: Not on file  Occupational History  . Occupation: unemployed  Tobacco Use  . Smoking status: Former Smoker    Packs/day: 0.50    Years: 35.00    Pack years: 17.50    Types: Cigarettes    Start date: 12/31/2018    Quit date: 12/31/2018    Years since quitting: 0.3  . Smokeless tobacco: Never Used  Substance and Sexual Activity  . Alcohol use: No  . Drug use: No  . Sexual activity: Not on file  Other Topics Concern  . Not on file    Social History Narrative   Patient drinks about 3-4 cups of caffeine daily.    Patient is right handed.    Social Determinants of Health   Financial Resource Strain:   . Difficulty of Paying Living Expenses: Not on file  Food Insecurity:   . Worried About Programme researcher, broadcasting/film/video in the Last Year: Not on file  . Ran Out of Food in the Last Year: Not on file  Transportation Needs:   . Lack of Transportation (Medical): Not on file  . Lack of Transportation (Non-Medical): Not on file  Physical Activity:   . Days of Exercise per Week: Not on file  . Minutes of Exercise per Session: Not on file  Stress:   . Feeling of Stress : Not on file  Social Connections:   . Frequency of Communication with Friends and Family: Not on file  . Frequency of Social Gatherings with Friends and Family: Not on file  . Attends Religious Services: Not on file  . Active Member of Clubs or Organizations: Not on file  . Attends Banker Meetings: Not on file  . Marital Status: Not on file  Intimate Partner Violence:   . Fear of Current or Ex-Partner: Not on file  . Emotionally Abused: Not on file  . Physically Abused: Not on file  . Sexually Abused: Not on file    Review of Systems  Constitution: Negative for decreased appetite, malaise/fatigue, weight gain and weight loss.  Eyes: Negative for visual disturbance.  Cardiovascular: Negative for chest pain, claudication, dyspnea on exertion, leg swelling, orthopnea, palpitations and syncope.  Respiratory: Negative for hemoptysis and wheezing.   Endocrine: Negative for cold intolerance and heat intolerance.  Hematologic/Lymphatic: Does not bruise/bleed easily.  Skin: Negative for nail changes.  Musculoskeletal: Positive for back pain. Negative for muscle weakness and myalgias.  Gastrointestinal: Negative for abdominal pain, change in bowel habit, nausea and vomiting.  Neurological: Negative for difficulty with concentration, dizziness, focal  weakness and headaches.  Psychiatric/Behavioral: Negative for altered mental status and suicidal ideas.  All other systems reviewed and are negative.     Objective:  Blood pressure 138/64, pulse 62, temperature (!) 97.2 F (36.2 C), temperature source Temporal, resp. rate 16, height 5' 2.5" (1.588 m), weight 140 lb 9.6 oz (63.8 kg), SpO2 95 %. Body mass index is 25.31 kg/m.    Physical Exam  Constitutional: She is oriented to person, place, and time. Vital signs are normal. She appears well-developed and well-nourished.  HENT:  Head: Normocephalic and atraumatic.  Cardiovascular: Normal rate, regular rhythm, normal heart sounds and intact distal pulses.  Pulmonary/Chest: Effort normal and breath sounds normal. No accessory muscle usage. No respiratory distress.  Sternotomy/CABG scar  Abdominal: Soft. Bowel sounds are normal.  Musculoskeletal:  General: Normal range of motion.     Cervical back: Normal range of motion.  Neurological: She is alert and oriented to person, place, and time.  Skin: Skin is warm and dry.  Vitals reviewed.  Radiology:  Coronary CTA 12/25/18:  1. The left main is short and FFRct cannot be modeled in this region.  2. FFRct in the proximal LAD is mildly abnormal. However modeling demonstrates that opening the left main stenosis results in a significant improvement in flow in the proximal LAD.  3. FFRct demonstrates significant stenoses in the proximal and mid LAD. Based on modeling, opening the LM and proximal stenosis does not normalize flow more distally.  4. Recommend cardiac catheterization. Take note of concern for ostial left main disease.  Laboratory examination:    CMP Latest Ref Rng & Units 01/14/2019 01/13/2019 01/12/2019  Glucose 70 - 99 mg/dL 107(H) 106(H) 118(H)  BUN 6 - 20 mg/dL 10 12 17   Creatinine 0.44 - 1.00 mg/dL 0.75 0.73 0.86  Sodium 135 - 145 mmol/L 138 139 139  Potassium 3.5 - 5.1 mmol/L 3.6 4.1 3.8  Chloride 98  - 111 mmol/L 99 99 100  CO2 22 - 32 mmol/L 28 30 30   Calcium 8.9 - 10.3 mg/dL 8.5(L) 8.5(L) 8.6(L)  Total Protein 6.5 - 8.1 g/dL - - -  Total Bilirubin 0.3 - 1.2 mg/dL - - -  Alkaline Phos 38 - 126 U/L - - -  AST 15 - 41 U/L - - -  ALT 0 - 44 U/L - - -   CBC Latest Ref Rng & Units 01/14/2019 01/13/2019 01/12/2019  WBC 4.0 - 10.5 K/uL 11.5(H) 13.9(H) 17.4(H)  Hemoglobin 12.0 - 15.0 g/dL 9.1(L) 9.4(L) 9.0(L)  Hematocrit 36.0 - 46.0 % 27.0(L) 28.3(L) 27.8(L)  Platelets 150 - 400 K/uL 197 171 162   Lipid Panel     Component Value Date/Time   CHOL 197 04/10/2012 0414   TRIG 251 (H) 04/10/2012 0414   HDL 42 04/10/2012 0414   CHOLHDL 4.7 04/10/2012 0414   VLDL 50 (H) 04/10/2012 0414   LDLCALC 105 (H) 04/10/2012 0414   HEMOGLOBIN A1C Lab Results  Component Value Date   HGBA1C 6.0 (H) 01/08/2019   MPG 125.5 01/08/2019   TSH No results for input(s): TSH in the last 8760 hours.  PRN Meds:. Medications Discontinued During This Encounter  Medication Reason  . nicotine (NICODERM CQ - DOSED IN MG/24 HOURS) 14 mg/24hr patch Error  . pantoprazole (PROTONIX) 20 MG tablet Reorder   Current Meds  Medication Sig  . ALPRAZolam (XANAX) 0.25 MG tablet Take 1 tablet (0.25 mg total) by mouth 2 (two) times daily.  Marland Kitchen aspirin EC 81 MG tablet Take 1 tablet (81 mg total) by mouth daily.  . cetirizine (ZYRTEC) 10 MG tablet Take 10 mg by mouth daily.  . Cholecalciferol (D3-1000) 25 MCG (1000 UT) capsule Take 1,000 Units by mouth daily.  . clopidogrel (PLAVIX) 75 MG tablet Take 1 tablet (75 mg total) by mouth daily.  Marland Kitchen gabapentin (NEURONTIN) 300 MG capsule Take 1 capsule (300 mg total) by mouth 3 (three) times daily.  Marland Kitchen HYDROcodone-acetaminophen (NORCO) 10-325 MG tablet Take 1 tablet by mouth every 6 (six) hours as needed.  . metoprolol tartrate (LOPRESSOR) 25 MG tablet Take 1 tablet (25 mg total) by mouth 2 (two) times daily.  . pantoprazole (PROTONIX) 20 MG tablet Take 1 tablet (20 mg total) by  mouth daily.  . promethazine (PHENERGAN) 25 MG tablet Take 25 mg every  6 (six) hours as needed by mouth for nausea or vomiting.  . rosuvastatin (CRESTOR) 40 MG tablet Take 1 tablet (40 mg total) by mouth daily.  . SUMAtriptan (IMITREX) 100 MG tablet Take 100 mg by mouth every 2 (two) hours as needed for migraine. May repeat in 2 hours if headache persists or recurs.  . Vitamin D, Ergocalciferol, (DRISDOL) 1.25 MG (50000 UT) CAPS capsule Take 50,000 Units by mouth every 7 (seven) days.  . [DISCONTINUED] pantoprazole (PROTONIX) 20 MG tablet Take 1 tablet (20 mg total) by mouth daily.    Cardiac Studies:   Echo 01/09/2019:  1. Left ventricular ejection fraction, by visual estimation, is 60 to 65%. The left ventricle has normal function. Normal left ventricular size. There is mildly increased left ventricular hypertrophy.  2. Global right ventricle has normal systolic function.The right ventricular size is normal. No increase in right ventricular wall thickness.  3. Left atrial size was normal.  4. Right atrial size was normal.  5. The mitral valve is normal in structure. Trace mitral valve regurgitation. No evidence of mitral stenosis.  6. The tricuspid valve is normal in structure. Tricuspid valve regurgitation is trivial.  7. The aortic valve is normal in structure. Aortic valve regurgitation was not visualized by color flow Doppler. Structurally normal aortic valve, with no evidence of sclerosis or stenosis.  8. The pulmonic valve was normal in structure. Pulmonic valve regurgitation is not visualized by color flow Doppler.  9. Mild plaque invoving the ascending aorta. 10. The inferior vena cava is normal in size with greater than 50% respiratory variability, suggesting right atrial pressure of 3 mmHg.  Cath 01/08/2019:  LM: Mid calcified 90% stenosis. LAD: Mid LAD focal calfied 60% stenosis after D2 LCx: Normal RCA: Ostial calcification without significant disease  LVEF normal LVEDP  mildly elevated at 21 mmHg  Patient reported 3/10 chest pain at the end of the procedure, improved with IV NTG 3 mcg/min  Will admit to stepdown unit with IV NTG drip and seek CVTS consult  Nuclear stress test [07/20/2015]: 1. The resting electrocardiogram demonstrated normal sinus rhythm, normal resting conduction, no resting arrhythmias and normal rest repolarization. The stress electrocardiogram was normal. Patient exercised on Bruce protocol for 8:35 minutes and achieved 10.16 METS. Stress test terminated due to target heart rate (86% MPHR). Stress symptoms included dyspnea, dizziness. The stress test was terminated because of fatigue. 2. Myocardial perfusion imaging is normal. Overall left ventricular systolic function was normal without regional wall motion abnormalities. The left ventricular ejection fraction was 54%.   Assessment:   Atherosclerosis of native coronary artery of native heart without angina pectoris - Plan: pantoprazole (PROTONIX) 20 MG tablet  S/P CABG x 2  Essential hypertension  Mixed hyperlipidemia - Plan: Lipid Profile, Comprehensive Metabolic Panel (CMET)  Former tobacco use  EKG 01/31/2019: Normal sinus rhythm at 61 bpm, normal axis, no evidence of ischemia.   Recommendations:   Patient is here for 2 month follow up. She is doing well without any complaints. She has continued to recover well and I am pleased with her progress. Continue with DAPT. Blood pressure is well controlled. No symptoms of angina or clinical evidence of heart failure. I have encouraged her to start cardiac rehab.   She has not had recent lipid profile testing, will obtain lipids and CMP for follow up and will notify her of any needed changes.   She has not smoked in the last 2 weeks. Encouraged her to continue to work on  complete smoking cessation. She appears motivated. I will plan to see her back in 6 months or sooner if needed.   Toniann Fail, MSN, APRN,  FNP-C [facility] Office: [phone] Fax: [phone]

## 2019-04-23 ENCOUNTER — Encounter: Payer: Self-pay | Admitting: Cardiology

## 2019-04-23 ENCOUNTER — Telehealth (HOSPITAL_COMMUNITY): Payer: Self-pay | Admitting: *Deleted

## 2019-04-23 NOTE — Telephone Encounter (Signed)
Cardiac Rehab   Received referral for patient to enroll in cardiac rehab. Contacted patient for scheduling but patient declines participation at this time. Patient states she's walking at home and doesn't feel that she needs to enroll in cardiac rehab at this time. I advised patient that if she should change her mind to let Dr. Verl Dicker office know, and she is agreeable to this.  Artist Pais, MS, ACSM CEP

## 2019-06-17 ENCOUNTER — Telehealth: Payer: Self-pay

## 2019-06-17 ENCOUNTER — Encounter: Payer: Self-pay | Admitting: Cardiology

## 2019-06-17 NOTE — Telephone Encounter (Signed)
Yes please. NO contraindication. I sent it to her my chart inbox

## 2019-07-18 ENCOUNTER — Telehealth: Payer: Self-pay

## 2019-07-18 NOTE — Telephone Encounter (Signed)
Received a call from patient and patient is requesting to increase her Pantoprazole 20mg  to 40mg . Patient stated her heart burn is not being treated as well as she would like. Patient also states she was previously on 40 mg. Please advise. Thanks!

## 2019-07-18 NOTE — Telephone Encounter (Signed)
Yes she may. Also needs to ask PCP for refills but we can send 90 day supply with diagnosis of GERD without esophagitis.  JG

## 2019-07-19 ENCOUNTER — Other Ambulatory Visit: Payer: Self-pay

## 2019-07-19 DIAGNOSIS — I2510 Atherosclerotic heart disease of native coronary artery without angina pectoris: Secondary | ICD-10-CM

## 2019-07-19 DIAGNOSIS — K219 Gastro-esophageal reflux disease without esophagitis: Secondary | ICD-10-CM

## 2019-07-19 MED ORDER — PANTOPRAZOLE SODIUM 40 MG PO TBEC: 40.0000 mg | DELAYED_RELEASE_TABLET | Freq: Every day | ORAL | 0 refills | Status: DC

## 2019-07-19 NOTE — Telephone Encounter (Signed)
Spoke with patient and sent in medication.

## 2019-07-24 NOTE — Telephone Encounter (Signed)
Complete

## 2019-08-27 ENCOUNTER — Telehealth: Payer: Self-pay

## 2019-08-27 NOTE — Telephone Encounter (Signed)
Please reconcile her medications. Have her skip her today's dose of blood pressure medications.Have her decrease the Lopressor to 12.5 mg p.o. twice daily.  She is also on Neurontin and Norco's which may be contributing to her blood pressures being lower. Call the office if her symptoms continue.

## 2019-08-27 NOTE — Telephone Encounter (Signed)
Reconciled medication and relayed information to patient. Patient voiced understanding and will cb with questions or concerns.

## 2019-08-27 NOTE — Telephone Encounter (Signed)
Left vm to cb.

## 2019-08-27 NOTE — Telephone Encounter (Signed)
Received call from patient in regards to having CP on 5/30. Patient stated she had mild CP in the p.m but did not have to take Nitro. Patient stated the pain was dull and "didn't last long." Patient stated she is not experiencing any symptoms right now, but is concerned about her blood pressure. Patient states her pressures have been running 90's/50's since May 7. Patient still has low blood pressure (98/52) and would like to know if she needs to skip her blood pressure medication. Please advise. Thank you.

## 2019-09-18 ENCOUNTER — Other Ambulatory Visit: Payer: Self-pay | Admitting: Cardiology

## 2019-09-18 DIAGNOSIS — K219 Gastro-esophageal reflux disease without esophagitis: Secondary | ICD-10-CM

## 2019-10-15 ENCOUNTER — Ambulatory Visit: Payer: Medicare HMO | Admitting: Cardiology

## 2019-10-15 ENCOUNTER — Other Ambulatory Visit: Payer: Self-pay

## 2019-10-15 ENCOUNTER — Ambulatory Visit (INDEPENDENT_AMBULATORY_CARE_PROVIDER_SITE_OTHER): Payer: Medicare HMO | Admitting: Cardiology

## 2019-10-15 ENCOUNTER — Encounter: Payer: Self-pay | Admitting: Cardiology

## 2019-10-15 VITALS — BP 135/73 | HR 50 | Ht 62.5 in | Wt 138.0 lb

## 2019-10-15 DIAGNOSIS — E782 Mixed hyperlipidemia: Secondary | ICD-10-CM

## 2019-10-15 DIAGNOSIS — I472 Ventricular tachycardia: Secondary | ICD-10-CM

## 2019-10-15 DIAGNOSIS — I4729 Other ventricular tachycardia: Secondary | ICD-10-CM

## 2019-10-15 DIAGNOSIS — F17200 Nicotine dependence, unspecified, uncomplicated: Secondary | ICD-10-CM

## 2019-10-15 DIAGNOSIS — Z951 Presence of aortocoronary bypass graft: Secondary | ICD-10-CM

## 2019-10-15 DIAGNOSIS — I6523 Occlusion and stenosis of bilateral carotid arteries: Secondary | ICD-10-CM

## 2019-10-15 DIAGNOSIS — I10 Essential (primary) hypertension: Secondary | ICD-10-CM

## 2019-10-15 DIAGNOSIS — I2510 Atherosclerotic heart disease of native coronary artery without angina pectoris: Secondary | ICD-10-CM

## 2019-10-15 MED ORDER — ROSUVASTATIN CALCIUM 40 MG PO TABS: 40.0000 mg | ORAL_TABLET | Freq: Every day | ORAL | 3 refills | Status: DC

## 2019-10-15 MED ORDER — METOPROLOL SUCCINATE ER 25 MG PO TB24: 25.0000 mg | ORAL_TABLET | Freq: Every morning | ORAL | 0 refills | Status: DC

## 2019-10-15 MED ORDER — LOSARTAN POTASSIUM 25 MG PO TABS: 25.0000 mg | ORAL_TABLET | Freq: Every evening | ORAL | 0 refills | Status: DC

## 2019-10-15 NOTE — Progress Notes (Signed)
[name] Date of Birth: 1961/02/27 MRN: [id number] Primary Care Provider:Gibson, Christena Deem, FNP Former Cardiology Providers: Altamese Allen, APRN, FNP-C Primary Cardiologist: Tessa Lerner, DO, [facility] (established care 04/22/2019) Primary cardiothoracic surgery: Dr. Cliffton Asters  Date: 10/15/19 Last Visit: 04/22/2019  Chief Complaint  Patient presents with  . Coronary Artery Disease  . Dizziness    HPI  Regina Perry is a 59 y.o.  female who presents to the office with a chief complaint of " management of CAD and dizziness." Patient's past medical history and cardiovascular risk factors include: Hypertension, hyperlipidemia, tobacco use disorder, asymptomatic nonsustained ventricular tachycardia noted on event monitor in 2017, atherosclerosis of the native coronary arteries status post two-vessel CABG 12/2018.   In the past, patient has been under the care of Phineas Semen and now transitioned her care to me presents today for the management of coronary artery disease status post two-vessel bypass surgery October 2020.  In the past patient was noted to have left main disease on coronary CTA and subsequently underwent emergent left heart catheterization which confirmed the findings and underwent coronary artery bypass grafting surgery in October 2020.  Status post surgery patient has done well and is here for follow-up.  Patient states that she does not have any chest pain or shortness of breath at rest or with effort related activities.  Her overall functional status is stable.  She takes care of her 7012-lead and walks her dog daily basis.    In the interim, patient was having episodes of dizziness but only when changing positions.  The symptoms were intermittent, lasting for approximately 3 months in duration with .  She called the office and was recommended to decrease her Lopressor to 12.5 g p.o. twice daily.  However, patient was taking Lopressor 25 mg in the morning and 12.5 mg in the evenings.   Patient states that the symptoms improved significantly but her blood pressures were significantly elevated so she went back to the original dosing.   Unfortunately, patient continues to smoke at least 5 cigarettes/day.  Denies prior history of congestive heart failure, deep venous thrombosis, pulmonary embolism, stroke, transient ischemic attack.  FUNCTIONAL STATUS: She walks 7 acres daily and walks her dog daily as well.    ALLERGIES: Allergies  Allergen Reactions  . Percocet [Oxycodone-Acetaminophen] Other (See Comments)    Sick on stomach when taken with anesthesia    MEDICATION LIST PRIOR TO VISIT: Current Outpatient Medications on File Prior to Visit  Medication Sig Dispense Refill  . ALPRAZolam (XANAX) 0.5 MG tablet Take 0.5 mg by mouth in the morning and at bedtime.    Marland Kitchen aspirin EC 81 MG tablet Take 1 tablet (81 mg total) by mouth daily. 90 tablet 3  . clopidogrel (PLAVIX) 75 MG tablet Take 1 tablet (75 mg total) by mouth daily. 90 tablet 3  . escitalopram (LEXAPRO) 20 MG tablet Take 20 mg by mouth daily.    Marland Kitchen gabapentin (NEURONTIN) 300 MG capsule Take 1 capsule (300 mg total) by mouth 3 (three) times daily. 90 capsule 4  . HYDROcodone-acetaminophen (NORCO) 10-325 MG tablet Take 1 tablet by mouth every 6 (six) hours as needed.    Marland Kitchen levocetirizine (XYZAL) 5 MG tablet Take 5 mg by mouth daily.    . pantoprazole (PROTONIX) 40 MG tablet TAKE 1 TABLET EVERY DAY 90 tablet 0  . promethazine (PHENERGAN) 25 MG tablet Take 25 mg every 6 (six) hours as needed by mouth for nausea or vomiting.    Marland Kitchen  SUMAtriptan (IMITREX) 100 MG tablet Take 100 mg by mouth every 2 (two) hours as needed for migraine. May repeat in 2 hours if headache persists or recurs.    . Vitamin D, Ergocalciferol, (DRISDOL) 1.25 MG (50000 UT) CAPS capsule Take 50,000 Units by mouth every 7 (seven) days.     No current facility-administered medications on file prior to visit.    PAST MEDICAL HISTORY: Past Medical History:   Diagnosis Date  . Anxiety   . Bronchitis   . Depression   . Diverticulitis   . GERD (gastroesophageal reflux disease)   . H/O cesarean section   . History of hiatal hernia   . History of kidney stones    LEFT KIDNEY 2 STONES JUST WATCHING( [facility])  . Hypercholesteremia   . Hyperplastic colon polyp    12/30/2008  . Hypertension   . Migraine   . 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 Yates Decamp), prescribed verapamil  . Pneumonia   . PONV (postoperative nausea and vomiting)   . 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   CORONARY ARTER(CABG) X2  LIMA to LAD and SVG TO OM1 01/09/19     PAST SURGICAL HISTORY: Past Surgical History:  Procedure Laterality Date  . ABDOMINAL HYSTERECTOMY     partial  . CESAREAN SECTION    . COLONOSCOPY    . CORONARY ARTERY BYPASS GRAFT N/A 01/09/2019   Procedure: CORONARY ARTERY BYPASS GRAFTING (CABG) X2 USING LEFT INTERNAL MAMMARY ARTERY TO CIRC AND RIGHT GREATER SAPHENOUS VEIN TO LAD.;  Surgeon: Corliss Skains, MD;  Location: [facility] OR;  Service: Open Heart Surgery;  Laterality: N/A;  . CORONARY ARTERY BYPASS GRAFT    . ETHMOIDECTOMY Bilateral 06/26/2014   Procedure: BILATERAL ANTERIOR ETHMOIDECTOMY;  Surgeon: Drema Halon, MD;  Location: [facility];  Service: ENT;  Laterality: Bilateral;  . LEFT HEART CATH AND CORONARY ANGIOGRAPHY N/A 01/08/2019   Procedure: LEFT HEART CATH AND CORONARY ANGIOGRAPHY;  Surgeon: Elder Negus, MD;  Location: [facility];  Service: Cardiovascular;  Laterality: N/A;  . MAXILLARY ANTROSTOMY Bilateral 06/26/2014   Procedure: BILATERAL MAXILLARY OSTEA ENLARGEMENT;  Surgeon: Drema Halon, MD;  Location: [facility];  Service: ENT;  Laterality: Bilateral;  . NASAL SEPTOPLASTY W/ TURBINOPLASTY Bilateral 06/26/2014   Procedure: BILATERAL NASAL SEPTOPLASTY WITH TURBINATE REDUCTION;  Surgeon:  Drema Halon, MD;  Location: [facility];  Service: ENT;  Laterality: Bilateral;  . SHOULDER ARTHROSCOPY W/ ROTATOR CUFF REPAIR  6/15   right  . TEE WITHOUT CARDIOVERSION  01/09/2019   Procedure: Transesophageal Echocardiogram (Tee);  Surgeon: Corliss Skains, MD;  Location: [facility] OR;  Service: Open Heart Surgery;;  . TONSILLECTOMY AND ADENOIDECTOMY    . TUBAL LIGATION      FAMILY HISTORY: The patient's family history includes AAA (abdominal aortic aneurysm) in her sister; Aneurysm in her mother; Breast cancer in her paternal grandmother; Cerebral aneurysm in her father; Heart disease in her mother; Irritable bowel syndrome in her mother; Lung cancer in her paternal grandfather; Migraines in her mother.   SOCIAL HISTORY:  The patient  reports that she quit smoking about 9 months ago. Her smoking use included cigarettes. She started smoking about 9 months ago. She has a 17.50 pack-year smoking history. She has never used smokeless tobacco. She reports that she does not drink alcohol and does not use drugs.  Review  of Systems  Constitutional: Negative for chills and fever.  HENT: Negative for hoarse voice and nosebleeds.   Eyes: Negative for discharge, double vision and pain.  Cardiovascular: Negative for chest pain, claudication, dyspnea on exertion, leg swelling, near-syncope, orthopnea, palpitations, paroxysmal nocturnal dyspnea and syncope.  Respiratory: Negative for hemoptysis and shortness of breath.   Musculoskeletal: Negative for muscle cramps and myalgias.  Gastrointestinal: Negative for abdominal pain, constipation, diarrhea, hematemesis, hematochezia, melena, nausea and vomiting.  Neurological: Positive for dizziness. Negative for light-headedness.   PHYSICAL EXAM: Vitals with BMI 10/15/2019 04/22/2019 03/01/2019  Height 5' 2.5" 5' 2.5" 5' 2.5"  Weight 138 lbs 140 lbs 10 oz 143 lbs  BMI 24.82 25.29 25.72  Systolic 135 138 469  Diastolic 73 64 81  Pulse 50  62 78   Orthostatic VS for the past 72 hrs (Last 3 readings):  Orthostatic BP Patient Position BP Location Cuff Size Orthostatic Pulse  10/15/19 1439 148/80 Sitting Left Arm Normal 55  10/15/19 1438 140/77 Sitting Left Arm Normal (!) 49  10/15/19 1437 145/77 Supine Left Arm Normal (!) 48   CONSTITUTIONAL: Well-developed and well-nourished. No acute distress.  SKIN: Skin is warm and dry. No rash noted. No cyanosis. No pallor. No jaundice HEAD: Normocephalic and atraumatic.  EYES: No scleral icterus MOUTH/THROAT: Moist oral membranes.  NECK: No JVD present. No thyromegaly noted. No carotid bruits  LYMPHATIC: No visible cervical adenopathy.  CHEST Normal respiratory effort. No intercostal retractions  LUNGS: Clear to auscultation bilaterally.  No stridor. No wheezes. No rales.  CARDIOVASCULAR: Regular rhythm, normal S1-S2, no murmurs rubs or gallops appreciated.  ABDOMINAL: No apparent ascites.  EXTREMITIES: No peripheral edema  HEMATOLOGIC: No significant bruising NEUROLOGIC: Oriented to person, place, and time. Nonfocal. Normal muscle tone.  PSYCHIATRIC: Normal mood and affect. Normal behavior. Cooperative  RADIOLOGY: Coronary CTA 12/25/18:  1. The left main is short and FFRct cannot be modeled in this region. 2. FFRct in the proximal LAD is mildly abnormal. However modeling demonstrates that opening the left main stenosis results in a significant improvement in flow in the proximal LAD. 3. FFRct demonstrates significant stenoses in the proximal and mid LAD. Based on modeling, opening the LM and proximal stenosis does not normalize flow more distally. 4. Recommend cardiac catheterization. Take note of concern for ostial left main disease.  CARDIAC DATABASE: Coronary artery bypass grafting: Year: 01/09/2019 Surgical anatomy: 2 vessel CABG: LIMA to LCx and SVG to LAD  EKG: 10/15/2019: Sinus bradycardia, 47 bpm, normal axis, poor R wave progression, without underlying ischemia or  injury pattern.  Echocardiogram: 01/09/2019: LVEF 60-65%, mild LVH, trace MR, trace TR, mild plaque involving ascending aorta.  Stress Testing:  Nuclear stress test [07/20/2015]:  Patient exercised on Bruce protocol for 8:35 minutes and achieved 10.16 METS. Stress test terminated due to target heart rate (86% MPHR). Myocardial perfusion imaging is normal. Overall left ventricular systolic function was normal without regional wall motion abnormalities. The left ventricular ejection fraction was 54%.  Heart Catheterization: Cath 01/08/2019:  LM: Mid calcified 90% stenosis. LAD: Mid LAD focal calfied 60% stenosis after D2 LCx: Normal RCA: Ostial calcification without significant disease  LVEF normal LVEDP mildly elevated at 21 mmHg  Carotid duplex: 01/09/2019: Right Carotid: Velocities in the right ICA are consistent with a 40-59% stenosis.  Left Carotid: Velocities in the left ICA are consistent with a 1-39% stenosis.  Vertebrals: Bilateral vertebral arteries demonstrate antegrade flow.  Subclavians: Bilateral subclavian arteries were stenotic.   LABORATORY DATA: CBC Latest  Ref Rng & Units 01/14/2019 01/13/2019 01/12/2019  WBC 4.0 - 10.5 K/uL 11.5(H) 13.9(H) 17.4(H)  Hemoglobin 12.0 - 15.0 g/dL 3.7(S) 2.8(B) 1.5(V)  Hematocrit 36 - 46 % 27.0(L) 28.3(L) 27.8(L)  Platelets 150 - 400 K/uL 197 171 162    CMP Latest Ref Rng & Units 01/14/2019 01/13/2019 01/12/2019  Glucose 70 - 99 mg/dL 761(Y) 073(X) 106(Y)  BUN 6 - 20 mg/dL 10 12 17   Creatinine 0.44 - 1.00 mg/dL 6.94 8.54  Sodium 135 - 145 mmol/L 138 139 139  Potassium 3.5 - 5.1 mmol/L 3.6 4.1 3.8  Chloride 98 - 111 mmol/L 99 99 100  CO2 22 - 32 mmol/L 28 30 30   Calcium 8.9 - 10.3 mg/dL 6.27) ) 0.3(J)  Total Protein 6.5 - 8.1 g/dL - - -  Total Bilirubin 0.3 - 1.2 mg/dL - - -  Alkaline Phos 38 - 126 U/L - - -  AST 15 - 41 U/L - - -  ALT 0 - 44 U/L - - -    Lipid Panel     Component Value Date/Time   CHOL  197 04/10/2012 0414   TRIG 251 (H) 04/10/2012 0414   HDL 42 04/10/2012 0414   CHOLHDL 4.7 04/10/2012 0414   VLDL 50 (H) 04/10/2012 0414   LDLCALC 105 (H) 04/10/2012 0414    Lab Results  Component Value Date   HGBA1C 6.0 (H) 01/08/2019   HGBA1C 5.6 04/10/2012   No components found for: NTPROBNP No results found for: TSH  Cardiac Panel (last 3 results) No results for input(s): CKTOTAL, CKMB, TROPONINIHS, RELINDX in the last 72 hours.  IMPRESSION:    ICD-10-CM   1. Atherosclerosis of native coronary artery of native heart without angina pectoris  I25.10 EKG 12-Lead    rosuvastatin (CRESTOR) 40 MG tablet    metoprolol succinate (TOPROL XL) 25 MG 24 hr tablet  2. S/P CABG x 2  Z95.1 metoprolol succinate (TOPROL XL) 25 MG 24 hr tablet  3. Atherosclerosis of both carotid arteries  I65.23 Lipid Panel With LDL/HDL Ratio    rosuvastatin (CRESTOR) 40 MG tablet    PCV CAROTID DUPLEX (BILATERAL)  4. Essential hypertension  I10 losartan (COZAAR) 25 MG tablet    Basic metabolic panel    Magnesium    Pro b natriuretic peptide (BNP)  5. Mixed hyperlipidemia  E78.2 Lipid Panel With LDL/HDL Ratio    rosuvastatin (CRESTOR) 40 MG tablet  6. Hx of asymptomatic NSVT (nonsustained ventricular tachycardia) (HCC)  I47.2   7. Tobacco use disorder  F17.200      RECOMMENDATIONS: Regina Perry is a 59 y.o. female whose past medical history and cardiovascular risk factors include: Hypertension, hyperlipidemia, tobacco use disorder, asymptomatic nonsustained ventricular tachycardia noted on event monitor in 2017, atherosclerosis of the native coronary arteries status post two-vessel CABG 12/2018.   Atherosclerosis of the native coronary artery without angina pectoris status post two-vessel CABG:  Medications reconciled.  Crestor refilled  Discontinue Lopressor.  Initiate Toprol-XL 25 mg p.o. every morning  Initiate losartan 25 mg p.o. every afternoon  Check blood work in 1 week after initiation  of losartan.  Reviewed prior records including but not limited to echo, coronary CTA, left heart catheterization, operative report, carotid duplex results.  EKG shows sinus bradycardia without underlying ischemia or injury pattern.  Two-vessel coronary artery bypass grafting surgery: Remains asymptomatic.  See above  Atherosclerosis of carotid arteries: Continue aspirin and statin therapy.  Repeat carotid duplex for 1 year follow-up in October  2021.  Educated on importance of smoking cessation.  We will recheck lipid profile  Benign essential hypertension: . Office blood pressure is at goal.  . Medication reconciled.  . She is asked to keep a log of both blood pressure and pulse so that medications can be titrated based on a trend as opposed to isolated blood pressure readings in the office. . If the blood pressure is consistently greater than patient is asked to call the office or her primary care provider's office for medication titration sooner than the next office visit.  . Low salt diet recommended. A diet that is rich in fruits, vegetables, legumes, and low-fat dairy products and low in snacks, sweets, and meats (such as the Dietary Approaches to Stop Hypertension [DASH] diet).   Mixed hyperlipidemia: Refilled Crestor, fasting lipid profile.  Active smoking: Educated on importance of complete smoking cessation.  FINAL MEDICATION LIST END OF ENCOUNTER: Meds ordered this encounter  Medications  . rosuvastatin (CRESTOR) 40 MG tablet    Sig: Take 1 tablet (40 mg total) by mouth daily.    Dispense:  90 tablet    Refill:  3  . metoprolol succinate (TOPROL XL) 25 MG 24 hr tablet    Sig: Take 1 tablet (25 mg total) by mouth in the morning. Hold if systolic blood pressure (top blood pressure number) less than 100 mmHg or heart rate less than 60 bpm (pulse).    Dispense:  90 tablet    Refill:  0  . losartan (COZAAR) 25 MG tablet    Sig: Take 1 tablet (25 mg total) by mouth every  evening.    Dispense:  90 tablet    Refill:  0    Medications Discontinued During This Encounter  Medication Reason  . Cholecalciferol (D3-1000) 25 MCG (1000 UT) capsule Completed Course  . cetirizine (ZYRTEC) 10 MG tablet Change in therapy  . metoprolol tartrate (LOPRESSOR) 25 MG tablet Change in therapy  . rosuvastatin (CRESTOR) 40 MG tablet Reorder     Current Outpatient Medications:  .  ALPRAZolam (XANAX) 0.5 MG tablet, Take 0.5 mg by mouth in the morning and at bedtime., Disp: , Rfl:  .  aspirin EC 81 MG tablet, Take 1 tablet (81 mg total) by mouth daily., Disp: 90 tablet, Rfl: 3 .  clopidogrel (PLAVIX) 75 MG tablet, Take 1 tablet (75 mg total) by mouth daily., Disp: 90 tablet, Rfl: 3 .  escitalopram (LEXAPRO) 20 MG tablet, Take 20 mg by mouth daily., Disp: , Rfl:  .  gabapentin (NEURONTIN) 300 MG capsule, Take 1 capsule (300 mg total) by mouth 3 (three) times daily., Disp: 90 capsule, Rfl: 4 .  HYDROcodone-acetaminophen (NORCO) 10-325 MG tablet, Take 1 tablet by mouth every 6 (six) hours as needed., Disp: , Rfl:  .  levocetirizine (XYZAL) 5 MG tablet, Take 5 mg by mouth daily., Disp: , Rfl:  .  pantoprazole (PROTONIX) 40 MG tablet, TAKE 1 TABLET EVERY DAY, Disp: 90 tablet, Rfl: 0 .  promethazine (PHENERGAN) 25 MG tablet, Take 25 mg every 6 (six) hours as needed by mouth for nausea or vomiting., Disp: , Rfl:  .  rosuvastatin (CRESTOR) 40 MG tablet, Take 1 tablet (40 mg total) by mouth daily., Disp: 90 tablet, Rfl: 3 .  SUMAtriptan (IMITREX) 100 MG tablet, Take 100 mg by mouth every 2 (two) hours as needed for migraine. May repeat in 2 hours if headache persists or recurs., Disp: , Rfl:  .  Vitamin D, Ergocalciferol, (DRISDOL) 1.25  MG (50000 UT) CAPS capsule, Take 50,000 Units by mouth every 7 (seven) days., Disp: , Rfl:  .  losartan (COZAAR) 25 MG tablet, Take 1 tablet (25 mg total) by mouth every evening., Disp: 90 tablet, Rfl: 0 .  metoprolol succinate (TOPROL XL) 25 MG 24 hr tablet,  Take 1 tablet (25 mg total) by mouth in the morning. Hold if systolic blood pressure (top blood pressure number) less than 100 mmHg or heart rate less than 60 bpm (pulse)., Disp: 90 tablet, Rfl: 0  Orders Placed This Encounter  Procedures  . Lipid Panel With LDL/HDL Ratio  . Basic metabolic panel  . Magnesium  . Pro b natriuretic peptide (BNP)  . EKG 12-Lead  . PCV CAROTID DUPLEX (BILATERAL)   --Continue cardiac medications as reconciled in final medication list. --Return in about 3 months (around 01/27/2020) for CAD followup., review test results.. Or sooner if needed. --Continue follow-up with your primary care physician regarding the management of your other chronic comorbid conditions.  Patient's questions and concerns were addressed to her satisfaction. She voices understanding of the instructions provided during this encounter.   This note was created using a voice recognition software as a result there may be grammatical errors inadvertently enclosed that do not reflect the nature of this encounter. Every attempt is made to correct such errors.  Patient has an extensive past cardiac history which was reviewed with her in great detail and also reviewed the prior diagnostic tests results including echo, coronary CTA, left heart catheterization, surgical operative report, carotid duplex.  Medications refilled and titrated.  Prescriptions sent to her pharmacy.  Coordination impaired overall cardiovascular care.  Total time spent 48 minutes.  Tessa LernerSunit Sherrina Zaugg, [place]DO, Bryn Mawr Rehabilitation HospitalFACC  Pager: [phone] Office: [phone]

## 2019-10-17 ENCOUNTER — Other Ambulatory Visit: Payer: Self-pay

## 2019-10-17 DIAGNOSIS — I10 Essential (primary) hypertension: Secondary | ICD-10-CM

## 2019-10-17 DIAGNOSIS — E782 Mixed hyperlipidemia: Secondary | ICD-10-CM

## 2019-10-17 DIAGNOSIS — I208 Other forms of angina pectoris: Secondary | ICD-10-CM

## 2019-10-17 DIAGNOSIS — I6523 Occlusion and stenosis of bilateral carotid arteries: Secondary | ICD-10-CM

## 2019-11-13 ENCOUNTER — Telehealth: Payer: Self-pay

## 2019-11-13 ENCOUNTER — Emergency Department (HOSPITAL_COMMUNITY)
Admission: EM | Admit: 2019-11-13 | Discharge: 2019-11-14 | Disposition: A | Discharge status: Home / Self Care | Payer: Medicare HMO | Attending: Emergency Medicine | Admitting: Emergency Medicine

## 2019-11-13 ENCOUNTER — Encounter (HOSPITAL_COMMUNITY): Payer: Self-pay | Admitting: *Deleted

## 2019-11-13 ENCOUNTER — Emergency Department (HOSPITAL_COMMUNITY): Payer: Medicare HMO

## 2019-11-13 ENCOUNTER — Other Ambulatory Visit: Payer: Self-pay

## 2019-11-13 DIAGNOSIS — M79602 Pain in left arm: Secondary | ICD-10-CM | POA: Insufficient documentation

## 2019-11-13 DIAGNOSIS — R110 Nausea: Secondary | ICD-10-CM | POA: Insufficient documentation

## 2019-11-13 DIAGNOSIS — R0602 Shortness of breath: Secondary | ICD-10-CM | POA: Diagnosis not present

## 2019-11-13 DIAGNOSIS — M25522 Pain in left elbow: Secondary | ICD-10-CM | POA: Insufficient documentation

## 2019-11-13 DIAGNOSIS — R0789 Other chest pain: Secondary | ICD-10-CM | POA: Diagnosis not present

## 2019-11-13 DIAGNOSIS — Z5321 Procedure and treatment not carried out due to patient leaving prior to being seen by health care provider: Secondary | ICD-10-CM | POA: Insufficient documentation

## 2019-11-13 LAB — CBC
HCT: 43.3 % (ref 36.0–46.0)
Hemoglobin: 14 g/dL (ref 12.0–15.0)
MCH: 29.2 pg (ref 26.0–34.0)
MCHC: 32.3 g/dL (ref 30.0–36.0)
MCV: 90.4 fL (ref 80.0–100.0)
Platelets: 195 10*3/uL (ref 150–400)
RBC: 4.79 MIL/uL (ref 3.87–5.11)
RDW: 13.7 % (ref 11.5–15.5)
WBC: 7.3 10*3/uL (ref 4.0–10.5)
nRBC: 0 % (ref 0.0–0.2)

## 2019-11-13 LAB — TROPONIN I (HIGH SENSITIVITY)
Troponin I (High Sensitivity): 4 ng/L (ref <18)
Troponin I (High Sensitivity): 5 ng/L (ref <18)

## 2019-11-13 LAB — BASIC METABOLIC PANEL
Anion gap: 6 (ref 5–15)
BUN: 10 mg/dL (ref 6–20)
CO2: 24 mmol/L (ref 22–32)
Calcium: 8.9 mg/dL (ref 8.9–10.3)
Chloride: 109 mmol/L (ref 98–111)
Creatinine, Ser: 0.85 mg/dL (ref 0.44–1.00)
GFR calc Af Amer: >60 mL/min (ref >60)
GFR calc non Af Amer: >60 mL/min (ref >60)
Glucose, Bld: 104 mg/dL — ABNORMAL HIGH (ref 70–99)
Potassium: 4.4 mmol/L (ref 3.5–5.1)
Sodium: 139 mmol/L (ref 135–145)

## 2019-11-13 NOTE — ED Triage Notes (Signed)
Pt is here for left sided CP which feels sharp and radiates into her left arm until her elbow.  Pt reports that the pain began around 10:30 am and she took nitro and called her MD who advised her to come to ED.  Pt reports that she had open heart surgery in October and this CP has been accompanied with some sob and nausea.  Pain is 8/10

## 2019-11-13 NOTE — Telephone Encounter (Signed)
Pt called to to inform us that this morning she had chest, with pressure on top left breast, SOB, and lasted for about 10 min. Then she took her nitro and has had chest pain again after taking her nitro and lasted about 1-2 min  bp this morning168/87 pulse: 70, bp after taking nitro:102/77 pulse: 70, pt mention she feels like she has "water in side her head"

## 2019-11-13 NOTE — Telephone Encounter (Signed)
If the chest pain is similar to her prior episodes when she had bypass surgery she needs to go to ER.  If not, she can take another nitro tab and she how she feels.  If pain is not resolving she should go to ER for further evaluation.

## 2019-11-14 NOTE — Telephone Encounter (Signed)
Called pt and informed her if the pain was similar to the one she had before her procedure. Pt mention yes. Informed her per Dr. Odis Hollingshead she would need to go to the ER. Pt understood.

## 2019-11-14 NOTE — Telephone Encounter (Signed)
Tried calling her twice during lunch no answer. Please reach out to her.

## 2019-11-14 NOTE — Telephone Encounter (Signed)
Pt called to inform us that she went to the ER and waited 9 hrs, and was not seen. Pt mention they did xray, EKG, blood work and checked bp, pt mention her bp this morning RT was 149/90 pulse: 81 and left was 167/92 pulse: 81. Pt stated that she feels like her chest is swore. Please advise.

## 2019-11-15 ENCOUNTER — Other Ambulatory Visit: Payer: Self-pay | Admitting: Cardiology

## 2019-11-15 DIAGNOSIS — Z951 Presence of aortocoronary bypass graft: Secondary | ICD-10-CM

## 2019-11-15 DIAGNOSIS — I10 Essential (primary) hypertension: Secondary | ICD-10-CM

## 2019-11-15 DIAGNOSIS — I2510 Atherosclerotic heart disease of native coronary artery without angina pectoris: Secondary | ICD-10-CM

## 2019-11-15 MED ORDER — LOSARTAN POTASSIUM 50 MG PO TABS: 50.0000 mg | ORAL_TABLET | Freq: Every evening | ORAL | 0 refills | Status: DC

## 2019-11-15 MED ORDER — ISOSORBIDE MONONITRATE ER 30 MG PO TB24: 15.0000 mg | ORAL_TABLET | Freq: Every day | ORAL | 0 refills | Status: DC

## 2019-11-15 NOTE — Telephone Encounter (Signed)
Spoke to patient and medications changed.  Labs from ER reviewed with her.  Stress test ordered.

## 2019-11-15 NOTE — Telephone Encounter (Signed)
Please call.

## 2019-11-23 ENCOUNTER — Other Ambulatory Visit: Payer: Self-pay | Admitting: Cardiology

## 2019-11-23 DIAGNOSIS — K219 Gastro-esophageal reflux disease without esophagitis: Secondary | ICD-10-CM

## 2019-11-25 ENCOUNTER — Ambulatory Visit: Payer: Medicare HMO

## 2019-11-25 ENCOUNTER — Other Ambulatory Visit: Payer: Self-pay

## 2019-12-03 NOTE — Progress Notes (Signed)
Called and spoke with patient regarding her moving appt sooner, to go over stress results,  transferred call to (ext [phone])

## 2019-12-06 ENCOUNTER — Encounter: Payer: Self-pay | Admitting: Cardiology

## 2019-12-06 ENCOUNTER — Ambulatory Visit: Payer: Medicare HMO | Admitting: Cardiology

## 2019-12-06 ENCOUNTER — Other Ambulatory Visit: Payer: Self-pay

## 2019-12-06 VITALS — BP 152/73 | HR 58 | Resp 16 | Ht 62.5 in | Wt 140.6 lb

## 2019-12-06 DIAGNOSIS — I10 Essential (primary) hypertension: Secondary | ICD-10-CM

## 2019-12-06 DIAGNOSIS — F17200 Nicotine dependence, unspecified, uncomplicated: Secondary | ICD-10-CM

## 2019-12-06 DIAGNOSIS — I4729 Other ventricular tachycardia: Secondary | ICD-10-CM

## 2019-12-06 DIAGNOSIS — Z951 Presence of aortocoronary bypass graft: Secondary | ICD-10-CM

## 2019-12-06 DIAGNOSIS — E782 Mixed hyperlipidemia: Secondary | ICD-10-CM

## 2019-12-06 DIAGNOSIS — I6523 Occlusion and stenosis of bilateral carotid arteries: Secondary | ICD-10-CM

## 2019-12-06 DIAGNOSIS — I2510 Atherosclerotic heart disease of native coronary artery without angina pectoris: Secondary | ICD-10-CM

## 2019-12-06 DIAGNOSIS — I472 Ventricular tachycardia: Secondary | ICD-10-CM

## 2019-12-06 DIAGNOSIS — Z87891 Personal history of nicotine dependence: Secondary | ICD-10-CM

## 2019-12-06 MED ORDER — AMLODIPINE BESYLATE 10 MG PO TABS: 10.0000 mg | ORAL_TABLET | Freq: Every morning | ORAL | 0 refills | Status: DC

## 2019-12-06 NOTE — Progress Notes (Signed)
Regina Perry Date of Birth: 1960/05/08 MRN: [id number] Primary Care Provider:Gibson, Christena Deem, FNP Former Cardiology Providers: Altamese Leetonia, APRN, FNP-C Primary Cardiologist: Tessa Lerner, DO, [facility] (established care 04/22/2019) Primary cardiothoracic surgery: Dr. Cliffton Asters  Date: 12/06/19 Last Office Visit: 10/15/2019  Chief Complaint  Patient presents with  . Coronary Artery Disease  . Results  . Follow-up    3 month    HPI  Regina Perry is a 59 y.o.  female who presents to the office with a chief complaint of " management of CAD." Patient's past medical history and cardiovascular risk factors include: Hypertension, hyperlipidemia, tobacco use disorder, asymptomatic nonsustained ventricular tachycardia noted on event monitor in 2017, atherosclerosis of the native coronary arteries status post two-vessel CABG 12/2018.   In the past patient was noted to have left main disease on coronary CTA and subsequently underwent emergent left heart catheterization which confirmed the findings and underwent coronary artery bypass grafting surgery in October 2020.  Status post surgery patient has done well.   Since last office visit patient states that she was having substernal chest discomfort in August 2021 and the discomfort radiated to the bilateral costal margins as well as the right arm.  Patient states that discomfort was similar to her anginal improvement in the past but in severity was less.  The symptoms are consistent with her anginal.  Recommended to go to the ER for further evaluation and management.  At that time the cardiac biomarkers were negative x2.  Patient states that she was waiting in the ER for 9 hours and subsequently left AMA.  She had called the office and went up on the antianginal therapy.  She was started on Imdur 15 mg p.o. daily, losartan was increased to 100 mg p.o. every afternoon, and metoprolol 25 mg p.o. every morning.  Patient states that after starting Imdur 15  mg p.o. daily she was having headaches and therefore stopped taking it.  Since then her symptoms of chest discomfort have not resurfaced.  She is still able to walk her 7 acre of land every day.  Patient is concerned that when she wakes up in the morning her blood pressures are elevated.  Yesterday and today her systolic blood pressures were around 178/92 mmHg when she woke up.  She also has dry mouth when she wakes up, associated with morning headache and her husband states that she snores.  Patient underwent stress test since last office visit and results were reviewed with her in great detail at today's office visit.  Findings noted below for further reference  Patient has stopped smoking the last 2 days for which she is congratulated.  Denies prior history of congestive heart failure, deep venous thrombosis, pulmonary embolism, stroke, transient ischemic attack.  FUNCTIONAL STATUS: She walks 7 acres daily.  ALLERGIES: Allergies  Allergen Reactions  . Percocet [Oxycodone-Acetaminophen] Other (See Comments)    Sick on stomach when taken with anesthesia    MEDICATION LIST PRIOR TO VISIT: Current Outpatient Medications on File Prior to Visit  Medication Sig Dispense Refill  . ALPRAZolam (XANAX) 0.5 MG tablet Take 0.5 mg by mouth in the morning and at bedtime.    Marland Kitchen aspirin EC 81 MG tablet Take 1 tablet (81 mg total) by mouth daily. 90 tablet 3  . clopidogrel (PLAVIX) 75 MG tablet Take 1 tablet (75 mg total) by mouth daily. 90 tablet 3  . escitalopram (LEXAPRO) 20 MG tablet Take 20 mg by mouth daily.    Marland Kitchen  gabapentin (NEURONTIN) 300 MG capsule Take 1 capsule (300 mg total) by mouth 3 (three) times daily. 90 capsule 4  . HYDROcodone-acetaminophen (NORCO) 10-325 MG tablet Take 1 tablet by mouth every 6 (six) hours as needed.    Marland Kitchen levocetirizine (XYZAL) 5 MG tablet Take 5 mg by mouth daily.    Marland Kitchen losartan (COZAAR) 50 MG tablet Take 50 mg by mouth every evening.    . metoprolol succinate (TOPROL  XL) 25 MG 24 hr tablet Take 1 tablet (25 mg total) by mouth in the morning. Hold if systolic blood pressure (top blood pressure number) less than 100 mmHg or heart rate less than 60 bpm (pulse). 90 tablet 0  . pantoprazole (PROTONIX) 40 MG tablet TAKE 1 TABLET EVERY DAY 90 tablet 0  . promethazine (PHENERGAN) 25 MG tablet Take 25 mg every 6 (six) hours as needed by mouth for nausea or vomiting.    . rosuvastatin (CRESTOR) 40 MG tablet Take 1 tablet (40 mg total) by mouth daily. 90 tablet 3  . Vitamin D, Ergocalciferol, (DRISDOL) 1.25 MG (50000 UT) CAPS capsule Take 50,000 Units by mouth every 7 (seven) days.     No current facility-administered medications on file prior to visit.    PAST MEDICAL HISTORY: Past Medical History:  Diagnosis Date  . Anxiety   . Bronchitis   . Depression   . Diverticulitis   . GERD (gastroesophageal reflux disease)   . H/O cesarean section   . History of hiatal hernia   . History of kidney stones    LEFT KIDNEY 2 STONES JUST WATCHING( [facility])  . Hypercholesteremia   . Hyperplastic colon polyp    12/30/2008  . Hypertension   . Migraine   . 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 Yates Decamp), prescribed verapamil  . Pneumonia   . PONV (postoperative nausea and vomiting)   . 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   CORONARY ARTER(CABG) X2  LIMA to LAD and SVG TO OM1 01/09/19     PAST SURGICAL HISTORY: Past Surgical History:  Procedure Laterality Date  . ABDOMINAL HYSTERECTOMY     partial  . CESAREAN SECTION    . COLONOSCOPY    . CORONARY ARTERY BYPASS GRAFT N/A 01/09/2019   Procedure: CORONARY ARTERY BYPASS GRAFTING (CABG) X2 USING LEFT INTERNAL MAMMARY ARTERY TO CIRC AND RIGHT GREATER SAPHENOUS VEIN TO LAD.;  Surgeon: Corliss Skains, MD;  Location: [facility] OR;  Service: Open Heart Surgery;  Laterality: N/A;  . CORONARY ARTERY BYPASS GRAFT    . ETHMOIDECTOMY Bilateral  06/26/2014   Procedure: BILATERAL ANTERIOR ETHMOIDECTOMY;  Surgeon: Drema Halon, MD;  Location: [facility];  Service: ENT;  Laterality: Bilateral;  . LEFT HEART CATH AND CORONARY ANGIOGRAPHY N/A 01/08/2019   Procedure: LEFT HEART CATH AND CORONARY ANGIOGRAPHY;  Surgeon: Elder Negus, MD;  Location: [facility];  Service: Cardiovascular;  Laterality: N/A;  . MAXILLARY ANTROSTOMY Bilateral 06/26/2014   Procedure: BILATERAL MAXILLARY OSTEA ENLARGEMENT;  Surgeon: Drema Halon, MD;  Location: [facility];  Service: ENT;  Laterality: Bilateral;  . NASAL SEPTOPLASTY W/ TURBINOPLASTY Bilateral 06/26/2014   Procedure: BILATERAL NASAL SEPTOPLASTY WITH TURBINATE REDUCTION;  Surgeon: Drema Halon, MD;  Location: [facility];  Service: ENT;  Laterality: Bilateral;  . SHOULDER ARTHROSCOPY W/ ROTATOR CUFF REPAIR  6/15   right  . TEE WITHOUT CARDIOVERSION  01/09/2019   Procedure: Transesophageal Echocardiogram (Tee);  Surgeon: Corliss SkainsLightfoot, Harrell O, MD;  [facility];  Service: Open Heart Surgery;;  . TONSILLECTOMY AND ADENOIDECTOMY    . TUBAL LIGATION      FAMILY HISTORY: The patient's family history includes AAA (abdominal aortic aneurysm) in her sister; Aneurysm in her mother; Breast cancer in her paternal grandmother; Cerebral aneurysm in her father; Heart disease in her mother; Irritable bowel syndrome in her mother; Lung cancer in her paternal grandfather; Migraines in her mother.   SOCIAL HISTORY:  The patient  reports that she has been smoking cigarettes. She started smoking about 11 months ago. She has a 8.75 pack-year smoking history. She has never used smokeless tobacco. She reports that she does not drink alcohol and does not use drugs.  Review of Systems  Constitutional: Negative for chills and fever.  HENT: Negative for hoarse voice and nosebleeds.   Eyes: Negative for discharge, double vision and pain.   Cardiovascular: Negative for chest pain, claudication, dyspnea on exertion, leg swelling, near-syncope, orthopnea, palpitations, paroxysmal nocturnal dyspnea and syncope.  Respiratory: Negative for hemoptysis and shortness of breath.   Musculoskeletal: Negative for muscle cramps and myalgias.  Gastrointestinal: Negative for abdominal pain, constipation, diarrhea, hematemesis, hematochezia, melena, nausea and vomiting.  Neurological: Positive for dizziness. Negative for light-headedness.   PHYSICAL EXAM: Vitals with BMI 12/06/2019 11/13/2019 11/13/2019  Height 5' 2.5" - -  Weight 140 lbs 10 oz - -  BMI 25.29 - -  Systolic 152 176 [id number]  Diastolic 73 84 89  Pulse 58 49 48   Orthostatic VS for the past 72 hrs (Last 3 readings):  Patient Position BP Location Cuff Size  12/06/19 1141 Sitting Left Arm Normal   CONSTITUTIONAL: Well-developed and well-nourished. No acute distress.  SKIN: Skin is warm and dry. No rash noted. No cyanosis. No pallor. No jaundice HEAD: Normocephalic and atraumatic.  EYES: No scleral icterus MOUTH/THROAT: Moist oral membranes.  NECK: No JVD present. No thyromegaly noted. No carotid bruits  LYMPHATIC: No visible cervical adenopathy.  CHEST Normal respiratory effort. No intercostal retractions  LUNGS: Clear to auscultation bilaterally.  No stridor. No wheezes. No rales.  CARDIOVASCULAR: Regular rhythm, normal S1-S2, no murmurs rubs or gallops appreciated.  ABDOMINAL: No apparent ascites.  EXTREMITIES: No peripheral edema  HEMATOLOGIC: No significant bruising NEUROLOGIC: Oriented to person, place, and time. Nonfocal. Normal muscle tone.  PSYCHIATRIC: Normal mood and affect. Normal behavior. Cooperative  RADIOLOGY: Coronary CTA 12/25/18:  1. The left main is short and FFRct cannot be modeled in this region. 2. FFRct in the proximal LAD is mildly abnormal. However modeling demonstrates that opening the left main stenosis results in a significant improvement in flow  in the proximal LAD. 3. FFRct demonstrates significant stenoses in the proximal and mid LAD. Based on modeling, opening the LM and proximal stenosis does not normalize flow more distally. 4. Recommend cardiac catheterization. Take note of concern for ostial left main disease.  CARDIAC DATABASE: Coronary artery bypass grafting: Year: 01/09/2019 Surgical anatomy: 2 vessel CABG: LIMA to LCx and SVG to LAD  EKG: 10/15/2019: Sinus bradycardia, 47 bpm, normal axis, poor R wave progression, without underlying ischemia or injury pattern.  Echocardiogram: 01/09/2019: LVEF 60-65%, mild LVH, trace MR, trace TR, mild plaque involving ascending aorta.  Stress Testing:  PCV MYOCARDIAL PERFUSION WITH LEXISCAN 11/25/2019 Lexiscan (Walking with mod Bruce)Tetrofosmin Stress Test  11/25/2019: Walking Lexiscan protocol. Equivocal ECG stress. Resting EKG/ECG demonstrated normal sinus rhythm. Peak EKG revealed no ST-T  wave abnormalities. Recovery EKG revealed 1 mm horizontal ST depression of the inferolateral leads. Normal myocardial perfusion. Gated SPECT imaging of the left ventricle was normal. All segments of left ventricle demonstrated normal wall motion and thickening. TID is normal. Stress LV EF is normal 63%. Compared to exercise nuclear stress in 07/20/2015, exercise EKG without ischemia and normal perfusion. Low risk. Clinical correlation recommended.  Heart Catheterization: Cath 01/08/2019:  LM: Mid calcified 90% stenosis. LAD: Mid LAD focal calfied 60% stenosis after D2 LCx: Normal RCA: Ostial calcification without significant disease  LVEF normal LVEDP mildly elevated at 21 mmHg  Carotid duplex: 01/09/2019: Right Carotid: Velocities in the right ICA are consistent with a 40-59% stenosis.  Left Carotid: Velocities in the left ICA are consistent with a 1-39% stenosis.  Vertebrals: Bilateral vertebral arteries demonstrate antegrade flow.  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   LABORATORY DATA: CBC Latest Ref Rng & Units 11/13/2019 01/14/2019 01/13/2019  WBC 4.0 - 10.5 K/uL 7.3 11.5(H) 13.9(H)  Hemoglobin 12.0 - 15.0 g/dL 40.9 8.1(X) 9.1(Y)  Hematocrit 36 - 46 % 43.3 27.0(L) 28.3(L)  Platelets 150 - 400 K/uL 195 197 171    CMP Latest Ref Rng & Units 11/13/2019 01/14/2019 01/13/2019  Glucose 70 - 99 mg/dL 782(N) 562(Z) 308(M)  BUN 6 - 20 mg/dL Creatinine 0.44 - 1.00 mg/dL 5.78 4.69 6.29  Sodium 135 - 145 mmol/L 139 138 139  Potassium 3.5 - 5.1 mmol/L 4.4 3.6 4.1  Chloride 98 - 111 mmol/L 109 99 99  CO2 22 - 32 mmol/L Calcium 8.9 - 10.3 mg/dL 8.9 5.2(W) 4.1(L)  Total Protein 6.5 - 8.1 g/dL - - -  Total Bilirubin 0.3 - 1.2 mg/dL - - -  Alkaline Phos 38 - 126 U/L - - -  AST 15 - 41 U/L - - -  ALT 0 - 44 U/L - - -    Lipid Panel     Component Value Date/Time   CHOL 197 04/10/2012 0414   TRIG 251 (H) 04/10/2012 0414   HDL 42 04/10/2012 0414   CHOLHDL 4.7 04/10/2012 0414   VLDL 50 (H) 04/10/2012 0414   LDLCALC 105 (H) 04/10/2012 0414    Lab Results  Component Value Date   HGBA1C 6.0 (H) 01/08/2019   HGBA1C 5.6 04/10/2012   No components found for: NTPROBNP No results found for: TSH  Cardiac Panel (last 3 results) No results for input(s): CKTOTAL, CKMB, TROPONINIHS, RELINDX in the last 72 hours.  IMPRESSION:    ICD-10-CM   1. Atherosclerosis of native coronary artery of native heart without angina pectoris  I25.10   2. S/P CABG x 2  Z95.1   3. Mixed hyperlipidemia  E78.2   4. Atherosclerosis of both carotid arteries  I65.23   5. NSVT (nonsustained ventricular tachycardia) (HCC)  I47.2   6. Tobacco use disorder  F17.200   7. Former tobacco use  Z87.891   8. Essential hypertension  I10 amLODipine (NORVASC) 10 MG tablet    BASIC METABOLIC PANEL WITH GFR    Magnesium    Magnesium     RECOMMENDATIONS: Regina Perry is a 59 y.o. female whose past medical history and cardiovascular risk factors include:  Hypertension, hyperlipidemia, tobacco use disorder, asymptomatic nonsustained ventricular tachycardia noted on event monitor in 2017, atherosclerosis of the native coronary arteries status post two-vessel CABG 12/2018.   Atherosclerosis of the native coronary artery without angina pectoris status post two-vessel CABG:  Medications  reconciled.  Continue Toprol-XL 25 mg p.o. every morning  Continue losartan 50 mg p.o. every afternoon.  We will repeat blood work to reevaluate kidney function electrolytes.  Start amlodipine 10 mg p.o. every morning.  Patient is encouraged to stop smoking as it may be contributing to her underlying coronary vasospasm.  Continue to monitor her symptoms.  Would like to see her back in 3 months to reevaluate symptoms and blood pressure trend.  Given her symptoms I believe that she may have a component of sleep apnea as her morning blood pressures are elevated, has a dry mouth when she wakes up, headaches when she wakes up, and snores at night.  Patient is asked to follow-up with her PCP for possible sleep study evaluation by sleep medicine.  If she is unsuccessful patient is asked to call the office and we can refer her to sleep medicine as well.  Two-vessel coronary artery bypass grafting surgery: Remains asymptomatic.  See above  Atherosclerosis of carotid arteries: Continue aspirin and statin therapy.  Repeat carotid duplex for 1 year follow-up in October 2021.  Educated on importance of smoking cessation.  We will recheck lipid profile  Benign essential hypertension: . Office blood pressure is not at goal. See above for medication changes.  . Medication reconciled.  . She is asked to keep a log of both blood pressure and pulse so that medications can be titrated based on a trend as opposed to isolated blood pressure readings in the office. . If the blood pressure is consistently greater than patient is asked to call the office or her primary care  provider's office for medication titration sooner than the next office visit.  . Low salt diet recommended. A diet that is rich in fruits, vegetables, legumes, and low-fat dairy products and low in snacks, sweets, and meats (such as the Dietary Approaches to Stop Hypertension [DASH] diet).   Mixed hyperlipidemia: Refilled Crestor, fasting lipid profile.  Former smoking: Patient is commended on her efforts at stopping smoking.  She stopped smoking approximately 2 days ago and is educated on importance of complete smoking cessation.    FINAL MEDICATION LIST END OF ENCOUNTER: Meds ordered this encounter  Medications  . amLODipine (NORVASC) 10 MG tablet    Sig: Take 1 tablet (10 mg total) by mouth in the morning.    Dispense:  90 tablet    Refill:  0    Current Outpatient Medications:  .  ALPRAZolam (XANAX) 0.5 MG tablet, Take 0.5 mg by mouth in the morning and at bedtime., Disp: , Rfl:  .  aspirin EC 81 MG tablet, Take 1 tablet (81 mg total) by mouth daily., Disp: 90 tablet, Rfl: 3 .  clopidogrel (PLAVIX) 75 MG tablet, Take 1 tablet (75 mg total) by mouth daily., Disp: 90 tablet, Rfl: 3 .  escitalopram (LEXAPRO) 20 MG tablet, Take 20 mg by mouth daily., Disp: , Rfl:  .  gabapentin (NEURONTIN) 300 MG capsule, Take 1 capsule (300 mg total) by mouth 3 (three) times daily., Disp: 90 capsule, Rfl: 4 .  HYDROcodone-acetaminophen (NORCO) 10-325 MG tablet, Take 1 tablet by mouth every 6 (six) hours as needed., Disp: , Rfl:  .  levocetirizine (XYZAL) 5 MG tablet, Take 5 mg by mouth daily., Disp: , Rfl:  .  losartan (COZAAR) 50 MG tablet, Take 50 mg by mouth every evening., Disp: , Rfl:  .  metoprolol succinate (TOPROL XL) 25 MG 24 hr tablet, Take 1 tablet (25 mg total) by  mouth in the morning. Hold if systolic blood pressure (top blood pressure number) less than 100 mmHg or heart rate less than 60 bpm (pulse)., Disp: 90 tablet, Rfl: 0 .  pantoprazole (PROTONIX) 40 MG tablet, TAKE 1 TABLET EVERY DAY,  Disp: 90 tablet, Rfl: 0 .  promethazine (PHENERGAN) 25 MG tablet, Take 25 mg every 6 (six) hours as needed by mouth for nausea or vomiting., Disp: , Rfl:  .  rosuvastatin (CRESTOR) 40 MG tablet, Take 1 tablet (40 mg total) by mouth daily., Disp: 90 tablet, Rfl: 3 .  Vitamin D, Ergocalciferol, (DRISDOL) 1.25 MG (50000 UT) CAPS capsule, Take 50,000 Units by mouth every 7 (seven) days., Disp: , Rfl:  .  amLODipine (NORVASC) 10 MG tablet, Take 1 tablet (10 mg total) by mouth in the morning., Disp: 90 tablet, Rfl: 0  Orders Placed This Encounter  Procedures  . BASIC METABOLIC PANEL WITH GFR  . Magnesium   --Continue cardiac medications as reconciled in final medication list. --Return in about 3 months (around 03/06/2020) for Reevaluation of, CAD, BP. Or sooner if needed. --Continue follow-up with your primary care physician regarding the management of your other chronic comorbid conditions.  Patient's questions and concerns were addressed to her satisfaction. She voices understanding of the instructions provided during this encounter.   This note was created using a voice recognition software as a result there may be grammatical errors inadvertently enclosed that do not reflect the nature of this encounter. Every attempt is made to correct such errors.  Patient has an extensive past cardiac history which was reviewed with her in great detail and also reviewed the prior diagnostic tests results including echo, coronary CTA, left heart catheterization, surgical operative report, carotid duplex.  Medications refilled and titrated.  Prescriptions sent to her pharmacy.  Coordination impaired overall cardiovascular care.  Total time spent 48 minutes.  Tessa Lerner, [place], [facility]  Pager: [phone] Office: [phone]

## 2019-12-11 ENCOUNTER — Other Ambulatory Visit: Payer: Self-pay | Admitting: Cardiology

## 2019-12-11 DIAGNOSIS — Z951 Presence of aortocoronary bypass graft: Secondary | ICD-10-CM

## 2019-12-11 DIAGNOSIS — I2510 Atherosclerotic heart disease of native coronary artery without angina pectoris: Secondary | ICD-10-CM

## 2019-12-13 ENCOUNTER — Other Ambulatory Visit: Payer: Self-pay | Admitting: Cardiology

## 2019-12-13 DIAGNOSIS — I2510 Atherosclerotic heart disease of native coronary artery without angina pectoris: Secondary | ICD-10-CM

## 2019-12-30 ENCOUNTER — Ambulatory Visit: Payer: Medicare HMO

## 2019-12-30 ENCOUNTER — Other Ambulatory Visit: Payer: Self-pay

## 2019-12-30 DIAGNOSIS — I6523 Occlusion and stenosis of bilateral carotid arteries: Secondary | ICD-10-CM

## 2019-12-31 ENCOUNTER — Other Ambulatory Visit: Payer: Self-pay | Admitting: Cardiology

## 2019-12-31 DIAGNOSIS — I6523 Occlusion and stenosis of bilateral carotid arteries: Secondary | ICD-10-CM

## 2020-01-15 ENCOUNTER — Ambulatory Visit: Payer: Medicare HMO | Admitting: Cardiology

## 2020-02-27 ENCOUNTER — Other Ambulatory Visit: Payer: Self-pay | Admitting: Student

## 2020-02-27 ENCOUNTER — Telehealth: Payer: Self-pay

## 2020-02-27 NOTE — Telephone Encounter (Signed)
Called and notified patient to stop Plavix and keep appointment with Dr. Odis Hollingshead.

## 2020-02-27 NOTE — Telephone Encounter (Signed)
Okay to stop plavix. Keep f/uDr. Odis Hollingshead on 12/9.  Thanks MJP

## 2020-02-27 NOTE — Progress Notes (Signed)
Patient called to notify our office that she had noticed blood in her urine and stool over the last 2 days.  She had called her PCP who instructed her to stop taking Plavix.  She called our office to verify that it was okay for her to do so.  Informed patient to stop Plavix and to keep follow-up appointment with Dr. Doylene Canard on 03/05/2020.

## 2020-02-27 NOTE — Telephone Encounter (Signed)
Pt called to inform us that on 11/30 she had blood in her urine and then blood in her stool. Pt called her PCP this morning to inform her what is going on and they told her to stop her Plavix 75 mg and to give [place] a call. Pt mention she is having stomach cramps, dizziness, and weakness. Please advise

## 2020-03-05 ENCOUNTER — Ambulatory Visit: Payer: Medicare HMO | Admitting: Cardiology

## 2020-03-05 ENCOUNTER — Encounter: Payer: Self-pay | Admitting: Cardiology

## 2020-03-05 ENCOUNTER — Other Ambulatory Visit: Payer: Self-pay

## 2020-03-05 VITALS — BP 139/72 | HR 68 | Ht 62.5 in | Wt 137.0 lb

## 2020-03-05 DIAGNOSIS — Z951 Presence of aortocoronary bypass graft: Secondary | ICD-10-CM

## 2020-03-05 DIAGNOSIS — E782 Mixed hyperlipidemia: Secondary | ICD-10-CM

## 2020-03-05 DIAGNOSIS — I2510 Atherosclerotic heart disease of native coronary artery without angina pectoris: Secondary | ICD-10-CM

## 2020-03-05 DIAGNOSIS — I10 Essential (primary) hypertension: Secondary | ICD-10-CM

## 2020-03-05 DIAGNOSIS — I6523 Occlusion and stenosis of bilateral carotid arteries: Secondary | ICD-10-CM

## 2020-03-05 DIAGNOSIS — F17200 Nicotine dependence, unspecified, uncomplicated: Secondary | ICD-10-CM

## 2020-03-05 NOTE — Progress Notes (Signed)
Regina Perry Date of Birth: January 18, 1961 MRN: [id number] Primary Care Provider:Gibson, Christena Deem, FNP Former Cardiology Providers: Altamese Luquillo, APRN, FNP-C Primary Cardiologist: Tessa Lerner, DO, [facility] (established care 04/22/2019) Primary cardiothoracic surgery: Dr. Cliffton Asters  Date: 03/05/20 Last Office Visit: 12/06/2019  Chief Complaint  Patient presents with  . Coronary Artery Disease    HPI  Regina Perry is a 59 y.o.  female who presents to the office with a chief complaint of " management of CAD." Patient's past medical history and cardiovascular risk factors include: Hypertension, hyperlipidemia, tobacco use disorder, asymptomatic nonsustained ventricular tachycardia noted on event monitor in 2017, atherosclerosis of the native coronary arteries status post two-vessel CABG 12/2018, carotid artery atherosclerosis.   In the past patient was noted to have left main disease on coronary CTA and subsequently underwent emergent left heart catheterization which confirmed the findings and underwent coronary artery bypass grafting surgery in October 2020.  Status post surgery patient has done well.   Patient has been followed up periodically for management of coronary artery disease.  Patient symptoms have improved since the up titration of her guideline directed medical therapy.  She continues to walk at least 7 acres of her land every day without any effort related chest pain or shortness of breath.  Unfortunately, patient started to smoke again since last office visit.  Patient states that she is smoking 2 cigarettes/day.  Since last office visit she also underwent carotid duplex to reevaluate carotid artery atherosclerosis.  It appears that the right ICA is 50-69% stenotic with heterogenous plaque this is progressive compared to prior carotid duplex results.  Recently she started noticing gross hematuria last Wednesday, February 26, 2020.  She had called the office as she is on dual  antiplatelet therapy.  She was recommended to hold Plavix and follow-up with her PCP.  After that patient started having abdominal discomfort and nausea and started noticing frank blood in her stool.  Patient states that she has to make an appointment to follow-up with her PCP.  At the last office visit she was recommended to have a sleep study done.  Patient states that she was referred to a specialist in [facility] and that is too far for her and she is asking for referral closer within the community.  FUNCTIONAL STATUS: She walks 7 acres daily.  ALLERGIES: Allergies  Allergen Reactions  . Percocet [Oxycodone-Acetaminophen] Other (See Comments)    Sick on stomach when taken with anesthesia    MEDICATION LIST PRIOR TO VISIT: Current Outpatient Medications on File Prior to Visit  Medication Sig Dispense Refill  . ALPRAZolam (XANAX) 0.5 MG tablet Take 0.5 mg by mouth in the morning and at bedtime.    Marland Kitchen aspirin EC 81 MG tablet Take 1 tablet (81 mg total) by mouth daily. 90 tablet 3  . escitalopram (LEXAPRO) 20 MG tablet Take 20 mg by mouth daily.    Marland Kitchen gabapentin (NEURONTIN) 300 MG capsule Take 1 capsule (300 mg total) by mouth 3 (three) times daily. 90 capsule 4  . HYDROcodone-acetaminophen (NORCO) 10-325 MG tablet Take 1 tablet by mouth every 6 (six) hours as needed.    Marland Kitchen levocetirizine (XYZAL) 5 MG tablet Take 5 mg by mouth daily.    Marland Kitchen losartan (COZAAR) 25 MG tablet Take 25 mg by mouth every evening.    . metoprolol succinate (TOPROL-XL) 25 MG 24 hr tablet TAKE 1 TAB IN MORNING, HOLD IF SYSTOLIC BLOOD PRESSURE(TOP NUMBER) LESS THAN OR HEART RATE  LESS THAN 60BPM (PULSE) 90 tablet 0  . pantoprazole (PROTONIX) 40 MG tablet TAKE 1 TABLET EVERY DAY 90 tablet 0  . promethazine (PHENERGAN) 25 MG tablet Take 25 mg every 6 (six) hours as needed by mouth for nausea or vomiting.    . rosuvastatin (CRESTOR) 40 MG tablet Take 1 tablet (40 mg total) by mouth daily. 90 tablet 3  .  SUMAtriptan (IMITREX) 25 MG tablet Take 25 mg by mouth every 2 (two) hours as needed for migraine. May repeat in 2 hours if headache persists or recurs.    . Vitamin D, Ergocalciferol, (DRISDOL) 1.25 MG (50000 UT) CAPS capsule Take 50,000 Units by mouth every 7 (seven) days.     No current facility-administered medications on file prior to visit.    PAST MEDICAL HISTORY: Past Medical History:  Diagnosis Date  . Anxiety   . Bronchitis   . Depression   . Diverticulitis   . GERD (gastroesophageal reflux disease)   . H/O cesarean section   . History of hiatal hernia   . History of kidney stones    LEFT KIDNEY 2 STONES JUST WATCHING( [facility])  . Hypercholesteremia   . Hyperplastic colon polyp    12/30/2008  . Hypertension   . Migraine   . 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 Yates Decamp), prescribed verapamil  . Pneumonia   . PONV (postoperative nausea and vomiting)   . 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   CORONARY ARTER(CABG) X2  LIMA to LAD and SVG TO OM1 01/09/19     PAST SURGICAL HISTORY: Past Surgical History:  Procedure Laterality Date  . ABDOMINAL HYSTERECTOMY     partial  . CESAREAN SECTION    . COLONOSCOPY    . CORONARY ARTERY BYPASS GRAFT N/A 01/09/2019   Procedure: CORONARY ARTERY BYPASS GRAFTING (CABG) X2 USING LEFT INTERNAL MAMMARY ARTERY TO CIRC AND RIGHT GREATER SAPHENOUS VEIN TO LAD.;  Surgeon: Corliss Skains, MD;  Location: [facility] OR;  Service: Open Heart Surgery;  Laterality: N/A;  . CORONARY ARTERY BYPASS GRAFT    . ETHMOIDECTOMY Bilateral 06/26/2014   Procedure: BILATERAL ANTERIOR ETHMOIDECTOMY;  Surgeon: Drema Halon, MD;  Location: [facility];  Service: ENT;  Laterality: Bilateral;  . LEFT HEART CATH AND CORONARY ANGIOGRAPHY N/A 01/08/2019   Procedure: LEFT HEART CATH AND CORONARY ANGIOGRAPHY;  Surgeon: Elder Negus, MD;  Location: [facility];  Service: Cardiovascular;  Laterality: N/A;  . MAXILLARY ANTROSTOMY Bilateral 06/26/2014   Procedure: BILATERAL MAXILLARY OSTEA ENLARGEMENT;  Surgeon: Drema Halon, MD;  Location: [facility];  Service: ENT;  Laterality: Bilateral;  . NASAL SEPTOPLASTY W/ TURBINOPLASTY Bilateral 06/26/2014   Procedure: BILATERAL NASAL SEPTOPLASTY WITH TURBINATE REDUCTION;  Surgeon: Drema Halon, MD;  Location:  SURGERY CENTER;  Service: ENT;  Laterality: Bilateral;  . SHOULDER ARTHROSCOPY W/ ROTATOR CUFF REPAIR  6/15   right  . TEE WITHOUT CARDIOVERSION  01/09/2019   Procedure: Transesophageal Echocardiogram (Tee);  Surgeon: Corliss Skains, MD;  Location: [facility] OR;  Service: Open Heart Surgery;;  . TONSILLECTOMY AND ADENOIDECTOMY    . TUBAL LIGATION      FAMILY HISTORY: The patient's family history includes AAA (abdominal aortic aneurysm) in her sister; Aneurysm in her mother; Breast cancer in her paternal grandmother; Cerebral aneurysm in her father; Heart disease in her mother; Irritable bowel syndrome in her mother;  Lung cancer in her paternal grandfather; Migraines in her mother.   SOCIAL HISTORY:  The patient  reports that she has been smoking cigarettes. She started smoking about 14 months ago. She has a 8.75 pack-year smoking history. She has never used smokeless tobacco. She reports that she does not drink alcohol and does not use drugs.  Review of Systems  Constitutional: Negative for chills and fever.  HENT: Negative for hoarse voice and nosebleeds.   Eyes: Negative for discharge, double vision and pain.  Cardiovascular: Negative for chest pain, claudication, dyspnea on exertion, leg swelling, near-syncope, orthopnea, palpitations, paroxysmal nocturnal dyspnea and syncope.  Respiratory: Negative for hemoptysis and shortness of breath.   Musculoskeletal: Negative for muscle cramps and myalgias.  Gastrointestinal: Positive for hematochezia. Negative for  abdominal pain, constipation, diarrhea, hematemesis, melena, nausea and vomiting.  Genitourinary: Positive for hematuria (improved).  Neurological: Negative for light-headedness.   PHYSICAL EXAM: Vitals with BMI 03/05/2020 12/06/2019 11/13/2019  Height 5' 2.5" 5' 2.5" -  Weight 137 lbs 140 lbs 10 oz -  BMI 24.64 25.29 -  Systolic 139 152 [id number]  Diastolic 72 73 84  Pulse 68 58 49   CONSTITUTIONAL: Well-developed and well-nourished. No acute distress.  SKIN: Skin is warm and dry. No rash noted. No cyanosis. No pallor. No jaundice HEAD: Normocephalic and atraumatic.  EYES: No scleral icterus MOUTH/THROAT: Moist oral membranes.  NECK: No JVD present. No thyromegaly noted. No carotid bruits  LYMPHATIC: No visible cervical adenopathy.  CHEST Normal respiratory effort. No intercostal retractions  LUNGS: Clear to auscultation bilaterally.  No stridor. No wheezes. No rales.  CARDIOVASCULAR: Regular rhythm, normal S1-S2, no murmurs rubs or gallops appreciated.  ABDOMINAL: No apparent ascites.  EXTREMITIES: No peripheral edema  HEMATOLOGIC: No significant bruising NEUROLOGIC: Oriented to person, place, and time. Nonfocal. Normal muscle tone.  PSYCHIATRIC: Normal mood and affect. Normal behavior. Cooperative  RADIOLOGY: Coronary CTA 12/25/18:  1. The left main is short and FFRct cannot be modeled in this region. 2. FFRct in the proximal LAD is mildly abnormal. However modeling demonstrates that opening the left main stenosis results in a significant improvement in flow in the proximal LAD. 3. FFRct demonstrates significant stenoses in the proximal and mid LAD. Based on modeling, opening the LM and proximal stenosis does not normalize flow more distally. 4. Recommend cardiac catheterization. Take note of concern for ostial left main disease.  CARDIAC DATABASE: Coronary artery bypass grafting: Year: 01/09/2019 Surgical anatomy: 2 vessel CABG: LIMA to LCx and SVG to LAD  EKG: 10/15/2019: Sinus  bradycardia, 47 bpm, normal axis, poor R wave progression, without underlying ischemia or injury pattern.  Echocardiogram: 01/09/2019: LVEF 60-65%, mild LVH, trace MR, trace TR, mild plaque involving ascending aorta.  Stress Testing:  PCV MYOCARDIAL PERFUSION WITH LEXISCAN 11/25/2019 Lexiscan (Walking with mod Bruce)Tetrofosmin Stress Test  11/25/2019: Walking Lexiscan protocol. Equivocal ECG stress. Resting EKG/ECG demonstrated normal sinus rhythm. Peak EKG revealed no ST-T wave abnormalities. Recovery EKG revealed 1 mm horizontal ST depression of the inferolateral leads. Normal myocardial perfusion. Gated SPECT imaging of the left ventricle was normal. All segments of left ventricle demonstrated normal wall motion and thickening. TID is normal. Stress LV EF is normal 63%. Compared to exercise nuclear stress in 07/20/2015, exercise EKG without ischemia and normal perfusion. Low risk. Clinical correlation recommended.  Heart Catheterization: Cath 01/08/2019:  LM: Mid calcified 90% stenosis. LAD: Mid LAD focal calfied 60% stenosis after D2 LCx: Normal RCA: Ostial calcification without significant disease  LVEF normal LVEDP  mildly elevated at 21 mmHg  Carotid duplex: 01/09/2019: Right Carotid: Velocities in the right ICA are consistent with a 40-59% stenosis.  Left Carotid: Velocities in the left ICA are consistent with a 1-39% stenosis.  Vertebrals: Bilateral vertebral arteries demonstrate antegrade flow.  Subclavians: Bilateral subclavian arteries were stenotic.   Carotid artery duplex 12/30/2019: Stenosis in the right internal carotid artery (50-69%) with heterogenous plaque. Minimal stenosis in the left internal carotid artery (1-15%). Stenosis in the left common carotid artery (<50%) with homogenous plaque. Antegrade right vertebral artery flow. Antegrade left vertebral artery flow. Follow up in six months is appropriate if clinically indicated. No significant change from  01/09/2019 report.  LABORATORY DATA: CBC Latest Ref Rng & Units 11/13/2019 01/14/2019 01/13/2019  WBC 4.0 - 10.5 K/uL 7.3 11.5(H) 13.9(H)  Hemoglobin 12.0 - 15.0 g/dL 16.1 0.9(U) 0.4(V)  Hematocrit 36.0 - 46.0 % 43.3 27.0(L) 28.3(L)  Platelets 150 - 400 K/uL 195 197 171    CMP Latest Ref Rng & Units 11/13/2019 01/14/2019 01/13/2019  Glucose 70 - 99 mg/dL 409(W) 119(J) 478(G)  BUN 6 - 20 mg/dL Creatinine 0.44 - 1.00 mg/dL 9.56 2.13 0.86  Sodium 135 - 145 mmol/L 139 138 139  Potassium 3.5 - 5.1 mmol/L 4.4 3.6 4.1  Chloride 98 - 111 mmol/L 109 99 99  CO2 22 - 32 mmol/L Calcium 8.9 - 10.3 mg/dL 8.9 5.7(Q) 4.6(N)  Total Protein 6.5 - 8.1 g/dL - - -  Total Bilirubin 0.3 - 1.2 mg/dL - - -  Alkaline Phos 38 - 126 U/L - - -  AST 15 - 41 U/L - - -  ALT 0 - 44 U/L - - -    Lipid Panel  Lab Results  Component Value Date   CHOL 197 04/10/2012   HDL 42 04/10/2012   LDLCALC 105 (H) 04/10/2012   TRIG 251 (H) 04/10/2012   CHOLHDL 4.7 04/10/2012     Lab Results  Component Value Date   HGBA1C 6.0 (H) 01/08/2019   HGBA1C 5.6 04/10/2012   No components found for: NTPROBNP No results found for: TSH  Cardiac Panel (last 3 results) No results for input(s): CKTOTAL, CKMB, TROPONINIHS, RELINDX in the last 72 hours.  IMPRESSION:    ICD-10-CM   1. Atherosclerosis of native coronary artery of native heart without angina pectoris  I25.10 Ambulatory referral to Neurology  2. S/P CABG x 2  Z95.1 Ambulatory referral to Neurology  3. Atherosclerosis of both carotid arteries  I65.23   4. Mixed hyperlipidemia  E78.2 Lipid Panel With LDL/HDL Ratio    LDL cholesterol, direct    Comprehensive Metabolic Panel (CMET)  5. Tobacco use disorder  F17.200   6. Essential hypertension  I10      RECOMMENDATIONS: Regina Perry is a 59 y.o. female whose past medical history and cardiovascular risk factors include: Hypertension, hyperlipidemia, tobacco use disorder, asymptomatic  nonsustained ventricular tachycardia noted on event monitor in 2017, atherosclerosis of the native coronary arteries status post two-vessel CABG 12/2018.   Atherosclerosis of the native coronary artery without angina pectoris status post two-vessel CABG:  Medications reconciled.  Continue Toprol-XL 25 mg p.o. every morning  Continue losartan 25 mg p.o. every afternoon.    Educated on the importance of complete smoking cessation.  Patient's blood pressure has improved since last office visit.  At the last office visit patient was describing symptoms of sleep apnea as her morning blood pressures were elevated, dry mouth upon  waking up, and headaches when she wakes up, and snoring at night.  Patient is requesting her office to have a sleep medicine referral sent to a provider within the community.  I will refer her to Dr. Vickey Huger for further evaluation and management of sleep apnea.  Two-vessel coronary artery bypass grafting surgery: Remains asymptomatic.  See above.  Educated on importance of aggressive risk factor modifications.  Atherosclerosis of carotid arteries: Continue aspirin and statin therapy.  Duplex from October 2021 reviewed with her in great detail.  Given the progressive disease involving the right ICA recommend rechecking lipid profile.  If the patient's cholesterol levels are not well controlled we will need to uptitrate therapy.  Check fasting lipid profile and CMP.  Benign essential hypertension: . Office blood pressure is improving and home BP have been at goal (<130/80).  . Medication reconciled.  . Low salt diet recommended. A diet that is rich in fruits, vegetables, legumes, and low-fat dairy products and low in snacks, sweets, and meats (such as the Dietary Approaches to Stop Hypertension [DASH] diet).   Mixed hyperlipidemia: Refilled Crestor, will check fasting lipid profile.  Cigarette smoking:   Tobacco cessation counseling:  Currently smoking 2  cigarettes/day.   Patient was informed of the dangers of tobacco abuse including stroke, cancer, and MI, as well as benefits of tobacco cessation.  Patient is willing to quit at this time.  Approximately 7 mins were spent counseling patient cessation techniques. We discussed various methods to help quit smoking, including deciding on a date to quit, joining a support group, pharmacological agents- nicotine gum/patch/lozenges, chantix.   I will reassess her progress at the next follow-up visit  Patient has had episode of hematuria as well as hematochezia.  Patient is asked to follow-up with her PCP for further evaluation and management.  Patient verbalized understanding.  FINAL MEDICATION LIST END OF ENCOUNTER: No orders of the defined types were placed in this encounter.   Current Outpatient Medications:  .  ALPRAZolam (XANAX) 0.5 MG tablet, Take 0.5 mg by mouth in the morning and at bedtime., Disp: , Rfl:  .  aspirin EC 81 MG tablet, Take 1 tablet (81 mg total) by mouth daily., Disp: 90 tablet, Rfl: 3 .  escitalopram (LEXAPRO) 20 MG tablet, Take 20 mg by mouth daily., Disp: , Rfl:  .  gabapentin (NEURONTIN) 300 MG capsule, Take 1 capsule (300 mg total) by mouth 3 (three) times daily., Disp: 90 capsule, Rfl: 4 .  HYDROcodone-acetaminophen (NORCO) 10-325 MG tablet, Take 1 tablet by mouth every 6 (six) hours as needed., Disp: , Rfl:  .  levocetirizine (XYZAL) 5 MG tablet, Take 5 mg by mouth daily., Disp: , Rfl:  .  losartan (COZAAR) 25 MG tablet, Take 25 mg by mouth every evening., Disp: , Rfl:  .  metoprolol succinate (TOPROL-XL) 25 MG 24 hr tablet, TAKE 1 TAB IN MORNING, HOLD IF SYSTOLIC BLOOD PRESSURE(TOP NUMBER) LESS THAN OR HEART RATE LESS THAN 60BPM (PULSE), Disp: 90 tablet, Rfl: 0 .  pantoprazole (PROTONIX) 40 MG tablet, TAKE 1 TABLET EVERY DAY, Disp: 90 tablet, Rfl: 0 .  promethazine (PHENERGAN) 25 MG tablet, Take 25 mg every 6 (six) hours as needed by mouth for nausea or  vomiting., Disp: , Rfl:  .  rosuvastatin (CRESTOR) 40 MG tablet, Take 1 tablet (40 mg total) by mouth daily., Disp: 90 tablet, Rfl: 3 .  SUMAtriptan (IMITREX) 25 MG tablet, Take 25 mg by mouth every 2 (two) hours as needed for  migraine. May repeat in 2 hours if headache persists or recurs., Disp: , Rfl:  .  Vitamin D, Ergocalciferol, (DRISDOL) 1.25 MG (50000 UT) CAPS capsule, Take 50,000 Units by mouth every 7 (seven) days., Disp: , Rfl:   Orders Placed This Encounter  Procedures  . Lipid Panel With LDL/HDL Ratio  . LDL cholesterol, direct  . Comprehensive Metabolic Panel (CMET)  . Ambulatory referral to Neurology   --Continue cardiac medications as reconciled in final medication list. --Return in about 4 months (around 07/10/2020) for Follow up carotid disease, BP. . Or sooner if needed. --Continue follow-up with your primary care physician regarding the management of your other chronic comorbid conditions.  Patient's questions and concerns were addressed to her satisfaction. She voices understanding of the instructions provided during this encounter.   This note was created using a voice recognition software as a result there may be grammatical errors inadvertently enclosed that do not reflect the nature of this encounter. Every attempt is made to correct such errors.  Total time spent: 35 minutes  Delilah Shan [facility]  Pager: [phone] Office: [phone]

## 2020-03-06 ENCOUNTER — Ambulatory Visit: Payer: Medicare HMO | Admitting: Cardiology

## 2020-03-10 LAB — COMPREHENSIVE METABOLIC PANEL
ALT: 10 IU/L (ref 0–32)
AST: 14 IU/L (ref 0–40)
Albumin/Globulin Ratio: 2 (ref 1.2–2.2)
Albumin: 4.3 g/dL (ref 3.8–4.9)
Alkaline Phosphatase: 69 IU/L (ref 44–121)
BUN/Creatinine Ratio: 15 (ref 9–23)
BUN: 14 mg/dL (ref 6–24)
Bilirubin Total: 0.5 mg/dL (ref 0.0–1.2)
CO2: 24 mmol/L (ref 20–29)
Calcium: 8.9 mg/dL (ref 8.7–10.2)
Chloride: 109 mmol/L — ABNORMAL HIGH (ref 96–106)
Creatinine, Ser: 0.93 mg/dL (ref 0.57–1.00)
GFR calc Af Amer: 79 mL/min/{1.73_m2} (ref >59)
GFR calc non Af Amer: 68 mL/min/{1.73_m2} (ref >59)
Globulin, Total: 2.1 g/dL (ref 1.5–4.5)
Glucose: 100 mg/dL — ABNORMAL HIGH (ref 65–99)
Potassium: 4.6 mmol/L (ref 3.5–5.2)
Sodium: 145 mmol/L — ABNORMAL HIGH (ref 134–144)
Total Protein: 6.4 g/dL (ref 6.0–8.5)

## 2020-03-10 LAB — LIPID PANEL WITH LDL/HDL RATIO
Cholesterol, Total: 137 mg/dL (ref 100–199)
HDL: 47 mg/dL (ref >39)
LDL Chol Calc (NIH): 68 mg/dL (ref 0–99)
LDL/HDL Ratio: 1.4 ratio (ref 0.0–3.2)
Triglycerides: 123 mg/dL (ref 0–149)
VLDL Cholesterol Cal: 22 mg/dL (ref 5–40)

## 2020-03-10 LAB — LDL CHOLESTEROL, DIRECT: LDL Direct: 61 mg/dL (ref 0–99)

## 2020-04-15 ENCOUNTER — Other Ambulatory Visit: Payer: Self-pay | Admitting: Cardiology

## 2020-04-15 DIAGNOSIS — Z951 Presence of aortocoronary bypass graft: Secondary | ICD-10-CM

## 2020-04-15 DIAGNOSIS — I2510 Atherosclerotic heart disease of native coronary artery without angina pectoris: Secondary | ICD-10-CM

## 2020-04-15 DIAGNOSIS — K219 Gastro-esophageal reflux disease without esophagitis: Secondary | ICD-10-CM

## 2020-04-16 ENCOUNTER — Other Ambulatory Visit: Payer: Self-pay | Admitting: Neurology

## 2020-04-16 ENCOUNTER — Encounter: Payer: Self-pay | Admitting: Neurology

## 2020-04-16 ENCOUNTER — Ambulatory Visit: Payer: Medicare HMO | Admitting: Neurology

## 2020-04-16 ENCOUNTER — Other Ambulatory Visit: Payer: Self-pay

## 2020-04-16 ENCOUNTER — Telehealth: Payer: Self-pay | Admitting: Neurology

## 2020-04-16 VITALS — BP 138/86 | HR 53 | Ht 63.0 in | Wt 136.0 lb

## 2020-04-16 DIAGNOSIS — I25810 Atherosclerosis of coronary artery bypass graft(s) without angina pectoris: Secondary | ICD-10-CM

## 2020-04-16 DIAGNOSIS — I200 Unstable angina: Secondary | ICD-10-CM

## 2020-04-16 DIAGNOSIS — M48062 Spinal stenosis, lumbar region with neurogenic claudication: Secondary | ICD-10-CM | POA: Diagnosis not present

## 2020-04-16 DIAGNOSIS — G4719 Other hypersomnia: Secondary | ICD-10-CM

## 2020-04-16 DIAGNOSIS — R002 Palpitations: Secondary | ICD-10-CM

## 2020-04-16 DIAGNOSIS — Z720 Tobacco use: Secondary | ICD-10-CM | POA: Diagnosis not present

## 2020-04-16 DIAGNOSIS — F5104 Psychophysiologic insomnia: Secondary | ICD-10-CM

## 2020-04-16 MED ORDER — AIMOVIG 140 MG/ML ~~LOC~~ SOAJ: 140.0000 mg | SUBCUTANEOUS | 5 refills | Status: DC

## 2020-04-16 MED ORDER — AIMOVIG 140 MG/ML ~~LOC~~ SOAJ: 140.0000 mg | SUBCUTANEOUS | 0 refills | Status: AC

## 2020-04-16 NOTE — Progress Notes (Signed)
SLEEP MEDICINE CLINIC    Provider:  Melvyn Novas, MD  Primary Care Physician:  Elisabeth Most, FNP [address] [place] [address]     Referring Provider: Dr Odis Hollingshead, DO at [facility] .       Chief Complaint according to patient   Patient presents with:    . New Patient (Initial Visit)     Presents today to discuss concerns related to Sleep. She states that she doesn't sleep well, wakes up frequently during the night. Pt describes always feeling tired and no motivation. Never had a SS. Heart is racing , beating or galloping at night. Sleeps in 2 hour intervals.       HISTORY OF PRESENT ILLNESS:  Regina Perry is a 60 - year- old  Caucasian female patient was seen upon  referral on 04/16/2020 from [place] [facility]. Chief concern according to patient :  the first sleep period is 2-3 hours long and these get shorter and shorter, I wake up feeling refreshed- but not in the morning, also sleeps with palpitations, galloping.  Had MI and bypass surgery 2020. No stent.  She was a patient of Dr Anne Hahn, 2017 for sciatica.    I have the pleasure of seeing Regina Perry today, a right-handed White or Caucasian female with a possible sleep disorder.  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     Family medical /sleep history: mother with heart disease died at age 68. Had first MI at 77. No other family member with OSA, she has insomnia, sleep walkers.    Social history:  Patient is disabled  Before the heart attack in 2000, due to  rotator cuff and back injuries. She was working as Retail banker at [employer].  She lives in a  household with spouse and grandson , age 69.   Tobacco use; active tobacco user. Husband is still smoking also. ETOH use; none.  Caffeine intake in form of Coffee( 3 cups a day) Soda( 1- a day) Tea ( not regularly) no energy drinks. Regular exercise in form of walking.  Gardening yard work.     Sleep habits are as follows: The patient's dinner time is between 7 PM. The patient goes to bed at 10.30 PM and continues to sleep for 2-3  hours, wakes for palpitations , not pain, not snoring.  The bedroom has a fan, is cool, dark, quiet.  The preferred sleep position is on either side , with the support of 2 pillows.  Dreams are reportedly infrequent.  7 AM is the usual rise time. The patient wakes up spontaneously. She reports not feeling refreshed or restored in AM, with symptoms such as dry mouth , morning headaches, and residual fatigue.  Naps are taken frequently, lasting from 45 minutes and are more refreshing than nocturnal sleep.  Excessive daytime sleepiness, and nighttime insomnia.   Review of Systems: Out of a complete 14 system review, the patient complains of only the following symptoms, and all other reviewed systems are negative.:  Fatigue, sleepiness , snoring, fragmented sleep, Insomnia- palpitation.   SOB, active smoker.    How likely are you to doze in the following situations: 0 = not likely, 1 = slight chance,  2 = moderate chance, 3 = high chance   Sitting and Reading? Watching Television? Sitting inactive in a public place (theater or meeting)? As a passenger in a car for an hour without a break? Lying down in the afternoon when circumstances permit? Sitting and talking to someone? Sitting quietly after lunch without alcohol? In a car, while stopped for a few minutes in traffic?   Total = 16/  24 points   FSS endorsed at 52 / 63 points.   Mind racing, depression, anxiety.   Regina Perry was referred by Dr. Ninfa LindenSohn at [place] she is an established patient of his since  04-22-2019.  Her management of coronary artery disease is handled at his office as well as a recently discovered carotid artery stenosis.  She is a ongoing tobacco user she has ventricular tachycardia noted palpitations at night sometimes they seem to be dependent on the position she sleeps in.  She had a myocardial infarction followed by a two-vessel CABG 01/05/2019.  She had left main artery disease.  She has recently restarted smoking after initially being successful in quitting.  However her husband continue to smoke at home when she felt she could not was stay with him patient.  The carotid artery duplexes were quoted, she has gross hematuria in December 2021 while on dual antiplatelet therapy.  She was asked to hold Plavix and to follow-up with her PCP she had abdominal discomfort and noticed frank blood on her stool she has not yet seen GI gastrointestinal specialist.  I reviewed her medications.  She is on metoprolol Xyzal Cozaar Protonix there is hydrocodone acetaminophen here which may well lead to central apneas she is also on gabapentin Lexapro aspirin and Xanax.  She is at times used Imitrex which I do not like for patient with coronary artery disease and carotid artery disease at all.  The patient underwent an ethmoidectomy in 2016 nasal septoplasty and to begin resumption in 2016 maxillary antrostomy all under the guidance of Dillard Cannonhristopher Newman, MD.  EKG from July last year showed sinus bradycardia on beta-blockers with 47 bpm.  Echocardiogram in 2020 October showed ejection fraction of 60% mild left ventricular hypertrophy, traces of myocardial story of mitral regurgitation and a trace of tricuspid regurgitation.  October 2020 showed the left main to have calcified to a 90% stenosis LAD to 60% stenosis.  Carotid duplex quoted below, laboratory data showed normal white blood cell counts in October and August of this year.  He has elevated fasting glucoses.  Her HbA1c has been 6 which indicates that she  is prediabetic or may be already diabetic at this time.  So my main interest after today is to send the patient for an if possible attended sleep study, also looking at heart rate and rhythm in a home sleep test we cannot detect a heart rhythm.  There are no vascular constricting migraine medications at this Emgality or Ajovy that should be used instead of triptans.  We can certainly initiate migraine treatment here as well.   Social History   Socioeconomic History  . Marital status: Married    Spouse name: Not on file  . Number of children: 2  . Years of education: 6912  . Highest education level: Not on file  Occupational History  . Occupation: unemployed  Tobacco Use  . Smoking status: Current Some Day Smoker    Packs/day: 0.25    Years: 35.00    Pack years: 8.75    Types: Cigarettes  Start date: 12/31/2018    Last attempt to quit: 12/31/2018    Years since quitting: 1.2  . Smokeless tobacco: Never Used  Vaping Use  . Vaping Use: Never used  Substance and Sexual Activity  . Alcohol use: No  . Drug use: No  . Sexual activity: Not on file  Other Topics Concern  . Not on file  Social History Narrative   Patient drinks about 3-4 cups of caffeine daily.    Patient is right handed.    Social Determinants of Health   Financial Resource Strain: Not on file  Food Insecurity: Not on file  Transportation Needs: Not on file  Physical Activity: Not on file  Stress: Not on file  Social Connections: Not on file    Family History  Problem Relation Age of Onset  . Heart disease Mother        MI  . Irritable bowel syndrome Mother   . Migraines Mother   . Aneurysm Mother        brain  . Cerebral aneurysm Father   . AAA (abdominal aortic aneurysm) Sister   . Breast cancer Paternal Grandmother   . Lung cancer Paternal Grandfather   . Colon cancer Neg Hx   . Esophageal cancer Neg Hx   . Rectal cancer Neg Hx   . Stomach cancer Neg Hx     Past Medical History:  Diagnosis Date   . Anxiety   . Bronchitis   . Depression   . Diverticulitis   . GERD (gastroesophageal reflux disease)   . H/O cesarean section   . History of hiatal hernia   . History of kidney stones    LEFT KIDNEY 2 STONES JUST WATCHING( [facility])  . Hypercholesteremia   . Hyperplastic colon polyp    12/30/2008  . Hypertension   . Migraine   . 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 Yates Decamp), prescribed verapamil  . Pneumonia   . PONV (postoperative nausea and vomiting)   . 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   CORONARY ARTER(CABG) X2  LIMA to LAD and SVG TO OM1 01/09/19     Past Surgical History:  Procedure Laterality Date  . ABDOMINAL HYSTERECTOMY     partial  . CESAREAN SECTION    . COLONOSCOPY    . CORONARY ARTERY BYPASS GRAFT N/A 01/09/2019   Procedure: CORONARY ARTERY BYPASS GRAFTING (CABG) X2 USING LEFT INTERNAL MAMMARY ARTERY TO CIRC AND RIGHT GREATER SAPHENOUS VEIN TO LAD.;  Surgeon: Corliss Skains, MD;  Location: [facility] OR;  Service: Open Heart Surgery;  Laterality: N/A;  . CORONARY ARTERY BYPASS GRAFT    . ETHMOIDECTOMY Bilateral 06/26/2014   Procedure: BILATERAL ANTERIOR ETHMOIDECTOMY;  Surgeon: Drema Halon, MD;  Location: [facility];  Service: ENT;  Laterality: Bilateral;  . LEFT HEART CATH AND CORONARY ANGIOGRAPHY N/A 01/08/2019   Procedure: LEFT HEART CATH AND CORONARY ANGIOGRAPHY;  Surgeon: Elder Negus, MD;  Location: [facility];  Service: Cardiovascular;  Laterality: N/A;  . MAXILLARY ANTROSTOMY Bilateral 06/26/2014   Procedure: BILATERAL MAXILLARY OSTEA ENLARGEMENT;  Surgeon: Drema Halon, MD;  Location: [facility];  Service: ENT;  Laterality: Bilateral;  . NASAL SEPTOPLASTY W/ TURBINOPLASTY Bilateral 06/26/2014   Procedure: BILATERAL NASAL SEPTOPLASTY WITH TURBINATE REDUCTION;  Surgeon: Drema Halon, MD;  Location: [facility];  Service: ENT;  Laterality: Bilateral;  .  SHOULDER ARTHROSCOPY W/ ROTATOR CUFF REPAIR  6/15   right  . TEE WITHOUT CARDIOVERSION  01/09/2019   Procedure: Transesophageal Echocardiogram (Tee);  Surgeon: Corliss Skains, MD;  Location: [facility] OR;  Service: Open Heart Surgery;;  . TONSILLECTOMY AND ADENOIDECTOMY    . TUBAL LIGATION       Current Outpatient Medications on File Prior to Visit  Medication Sig Dispense Refill  . ALPRAZolam (XANAX) 0.5 MG tablet Take 0.5 mg by mouth in the morning and at bedtime.    Marland Kitchen aspirin EC 81 MG tablet Take 1 tablet (81 mg total) by mouth daily. 90 tablet 3  . escitalopram (LEXAPRO) 20 MG tablet Take 20 mg by mouth daily.    Marland Kitchen gabapentin (NEURONTIN) 300 MG capsule Take 1 capsule (300 mg total) by mouth 3 (three) times daily. 90 capsule 4  . HYDROcodone-acetaminophen (NORCO) 10-325 MG tablet Take 1 tablet by mouth every 6 (six) hours as needed.    Marland Kitchen levocetirizine (XYZAL) 5 MG tablet Take 5 mg by mouth daily.    Marland Kitchen losartan (COZAAR) 25 MG tablet Take 25 mg by mouth every evening.    . metoprolol succinate (TOPROL-XL) 25 MG 24 hr tablet TAKE 1 TAB IN THE MORNING,HOLD IF SYSTOLIC BLOOD PRESSURE(TOP NUMBER) LESS THAN OR HEART RATE LESS THAN 60BPM (PULSE 90 tablet 0  . pantoprazole (PROTONIX) 40 MG tablet TAKE 1 TABLET EVERY DAY 90 tablet 0  . promethazine (PHENERGAN) 25 MG tablet Take 25 mg every 6 (six) hours as needed by mouth for nausea or vomiting.    . SUMAtriptan (IMITREX) 25 MG tablet Take 25 mg by mouth every 2 (two) hours as needed for migraine. May repeat in 2 hours if headache persists or recurs.    . Vitamin D, Ergocalciferol, (DRISDOL) 1.25 MG (50000 UT) CAPS capsule Take 50,000 Units by mouth every 7 (seven) days.    . rosuvastatin (CRESTOR) 40 MG tablet Take 1 tablet (40 mg total) by mouth daily. 90 tablet 3   No current facility-administered medications on file prior to visit.    Allergies  Allergen Reactions  .  Percocet [Oxycodone-Acetaminophen] Other (See Comments)    Sick on stomach when taken with anesthesia     Physical exam:  Today's Vitals   04/16/20 1512  BP: 138/86  Pulse: (!) 53  Weight: 136 lb (61.7 kg)  Height: 5\' 3"  (1.6 m)   Body mass index is 24.09 kg/m.   Wt Readings from Last 3 Encounters:  04/16/20 136 lb (61.7 kg)  03/05/20 137 lb (62.1 kg)  12/06/19 140 lb 9.6 oz (63.8 kg)     Ht Readings from Last 3 Encounters:  04/16/20 5\' 3"  (1.6 m)  03/05/20 5' 2.5" (1.588 m)  12/06/19 5' 2.5" (1.588 m)      General: The patient is awake, alert and appears not in acute distress. The patient is groomed. She is anxious.  Head: Normocephalic, atraumatic. Neck is supple. Mallampati -  2 neck circumference:13 inches . Nasal airflow congested  .  Retrognathia is not seen.  Dental status: poor, no teeth lower jaw, and no dentures.  Cardiovascular:  Regular rate and cardiac rhythm by pulse,  without distended neck veins. Respiratory: Lungs are clear to auscultation.  Skin:  Without evidence of ankle edema, or rash. Trunk: The patient's posture is erect.   Neurologic exam : The patient is awake and alert, oriented to place and time.   Memory subjective described as intact.  Attention span & concentration  ability appears normal.  Speech is fluent,  logorrhea without  dysarthria, dysphonia or aphasia.  Mood and affect are anxious..   Cranial nerves: no loss of smell or taste reported  Pupils are equal and briskly reactive to light. Funduscopic exam deferred.   Extraocular movements in vertical and horizontal planes were intact and without nystagmus. No Diplopia. Visual fields by finger perimetry are intact. Hearing was intact to soft voice and finger rubbing.    Facial sensation intact to fine touch.  Facial motor strength is symmetric and tongue and uvula move midline.  Neck ROM : rotation, tilt and flexion extension were normal for age and shoulder shrug was symmetrical.     Motor exam:  Symmetric bulk, tone and ROM.   Normal tone without cog wheeling, symmetric grip strength .   Sensory:  Fine touch, pinprick and vibration were normal.  Proprioception tested in the upper extremities was normal.   Coordination: Rapid alternating movements in the fingers/hands were of normal speed.  The Finger-to-nose maneuver was intact without evidence of ataxia, dysmetria or tremor.   Gait and station: Patient could rise unassisted from a seated position, walked without assistive device.  Stance is of normal width/ base  Toe and heel walk were deferred.  Deep tendon reflexes: in the  upper and lower extremities are symmetric and brisk - elevated.  Babinski response was deferred.     After spending a total time of  45  minutes face to face and additional time for physical and neurologic examination, review of laboratory studies,  personal review of imaging studies, reports and results of other testing and review of referral information / records as far as provided in visit, I have established the following assessments:  Her HbA1c has been 6 which indicates that she is prediabetic or may be already diabetic at this time.  So my main interest after today is to send the patient for an if possible attended sleep study, also looking at heart rate and rhythm in a home sleep test we cannot detect a heart rhythm.  There are no vascular constricting migraine medications at this Emgality or Ajovy that should be used instead of triptans.  We can certainly initiate migraine treatment here as well.    1) excessive daytime sleepiness and nocturnal insomnia- related to OSA ?  palpitations at night.  2)  Depression anxiety-  3)  migraine , she is not allowed to use Triptans due to CAD,    My Plan is to proceed with:  1) PSG order with cardiac rhythm evaluation. Arousal causes. OSA verus CSA on opiates.  2) migraines 4-5  a month headaches 15 times a month  3) no opiate medication from  here - this will be through pain management or OCP.   I would like to thank Tessa Lernerolia, Sunit, DO for allowing me to meet with and to take care of this pleasant patient.    I plan to follow up either personally or through our NP within 3 month.   We will try Emgality in our next visit   CC: I will share my notes with   Electronically signed by: Melvyn Novasarmen Markeeta Scalf, MD 04/16/2020 3:41 PM  [facility] and [facility] certified by The [employer] Medicine and Diplomate of the [employer]. Board certified In Neurology through the ABPN, Fellow of the [employer]. Medical Director of [facility].

## 2020-04-16 NOTE — Telephone Encounter (Signed)
Pt will receive a sample of aimovig in office visit today. Given in LLQ. Advised the pt of potential side effects. Will attempt to get approved through insurance

## 2020-04-16 NOTE — Patient Instructions (Signed)
Screening for Sleep Apnea  Sleep apnea is a condition in which breathing pauses or becomes shallow during sleep. Sleep apnea screening is a test to determine if you are at risk for sleep apnea. The test is easy and only takes a few minutes. Your health care provider may ask you to have this test in preparation for surgery or as part of a physical exam. What are the symptoms of sleep apnea? Common symptoms of sleep apnea include:  Snoring.  Restless sleep.  Daytime sleepiness.  Pauses in breathing.  Choking during sleep.  Irritability.  Forgetfulness.  Trouble thinking clearly.  Depression.  Personality changes. Most people with sleep apnea are not aware that they have it. Why should I get screened? Getting screened for sleep apnea can help:  Ensure your safety. It is important for your health care providers to know whether or not you have sleep apnea, especially if you are having surgery or have other long-term (chronic) health conditions.  Improve your health and allow you to get a better night's rest. Restful sleep can help you: ? Have more energy. ? Lose weight. ? Improve high blood pressure. ? Improve diabetes management. ? Prevent stroke. ? Prevent car accidents. How is screening done? Screening usually includes being asked a list of questions about your sleep quality. Some questions you may be asked include:  Do you snore?  Is your sleep restless?  Do you have daytime sleepiness?  Has a partner or spouse told you that you stop breathing during sleep?  Have you had trouble concentrating or memory loss? If your screening test is positive, you are at risk for the condition. Further testing may be needed to confirm a diagnosis of sleep apnea. Where to find more information You can find screening tools online or at your health care clinic. For more information about sleep apnea screening and healthy sleep, visit these websites:  Centers for Disease Control and  Prevention: [employer]  American Sleep Apnea Association: www.sleepapnea.org Contact a health care provider if:  You think that you may have sleep apnea. Summary  Sleep apnea screening can help determine if you are at risk for sleep apnea.  It is important for your health care providers to know whether or not you have sleep apnea, especially if you are having surgery or have other chronic health conditions.  You may be asked to take a screening test for sleep apnea in preparation for surgery or as part of a physical exam. This information is not intended to replace advice given to you by your health care provider. Make sure you discuss any questions you have with your health care provider. Document Revised: 12/29/2017 Document Reviewed: 06/24/2016 Elsevier Patient Education  2021 [place]. Insomnia Insomnia is a sleep disorder that makes it difficult to fall asleep or stay asleep. Insomnia can cause fatigue, low energy, difficulty concentrating, mood swings, and poor performance at work or school. There are three different ways to classify insomnia:  Difficulty falling asleep.  Difficulty staying asleep.  Waking up too early in the morning. Any type of insomnia can be long-term (chronic) or short-term (acute). Both are common. Short-term insomnia usually lasts for three months or less. Chronic insomnia occurs at least three times a week for longer than three months. What are the causes? Insomnia may be caused by another condition, situation, or substance, such as:  Anxiety.  Certain medicines.  Gastroesophageal reflux disease (GERD) or other gastrointestinal conditions.  Asthma or other breathing conditions.  Restless legs  syndrome, sleep apnea, or other sleep disorders.  Chronic pain.  Menopause.  Stroke.  Abuse of alcohol, tobacco, or illegal drugs.  Mental health conditions, such as depression.  Caffeine.  Neurological disorders, such as  Alzheimer's disease.  An overactive thyroid (hyperthyroidism). Sometimes, the cause of insomnia may not be known. What increases the risk? Risk factors for insomnia include:  Gender. Women are affected more often than men.  Age. Insomnia is more common as you get older.  Stress.  Lack of exercise.  Irregular work schedule or working night shifts.  Traveling between different time zones.  Certain medical and mental health conditions. What are the signs or symptoms? If you have insomnia, the main symptom is having trouble falling asleep or having trouble staying asleep. This may lead to other symptoms, such as:  Feeling fatigued or having low energy.  Feeling nervous about going to sleep.  Not feeling rested in the morning.  Having trouble concentrating.  Feeling irritable, anxious, or depressed. How is this diagnosed? This condition may be diagnosed based on:  Your symptoms and medical history. Your health care provider may ask about: ? Your sleep habits. ? Any medical conditions you have. ? Your mental health.  A physical exam. How is this treated? Treatment for insomnia depends on the cause. Treatment may focus on treating an underlying condition that is causing insomnia. Treatment may also include:  Medicines to help you sleep.  Counseling or therapy.  Lifestyle adjustments to help you sleep better. Follow these instructions at home: Eating and drinking  Limit or avoid alcohol, caffeinated beverages, and cigarettes, especially close to bedtime. These can disrupt your sleep.  Do not eat a large meal or eat spicy foods right before bedtime. This can lead to digestive discomfort that can make it hard for you to sleep.   Sleep habits  Keep a sleep diary to help you and your health care provider figure out what could be causing your insomnia. Write down: ? When you sleep. ? When you wake up during the night. ? How well you sleep. ? How rested you feel the  next day. ? Any side effects of medicines you are taking. ? What you eat and drink.  Make your bedroom a dark, comfortable place where it is easy to fall asleep. ? Put up shades or blackout curtains to block light from outside. ? Use a white noise machine to block noise. ? Keep the temperature cool.  Limit screen use before bedtime. This includes: ? Watching TV. ? Using your smartphone, tablet, or computer.  Stick to a routine that includes going to bed and waking up at the same times every day and night. This can help you fall asleep faster. Consider making a quiet activity, such as reading, part of your nighttime routine.  Try to avoid taking naps during the day so that you sleep better at night.  Get out of bed if you are still awake after 15 minutes of trying to sleep. Keep the lights down, but try reading or doing a quiet activity. When you feel sleepy, go back to bed.   General instructions  Take over-the-counter and prescription medicines only as told by your health care provider.  Exercise regularly, as told by your health care provider. Avoid exercise starting several hours before bedtime.  Use relaxation techniques to manage stress. Ask your health care provider to suggest some techniques that may work well for you. These may include: ? Breathing exercises. ? Routines to release  muscle tension. ? Visualizing peaceful scenes.  Make sure that you drive carefully. Avoid driving if you feel very sleepy.  Keep all follow-up visits as told by your health care provider. This is important. Contact a health care provider if:  You are tired throughout the day.  You have trouble in your daily routine due to sleepiness.  You continue to have sleep problems, or your sleep problems get worse. Get help right away if:  You have serious thoughts about hurting yourself or someone else. If you ever feel like you may hurt yourself or others, or have thoughts about taking your own  life, get help right away. You can go to your nearest emergency department or call:  Your local emergency services (911 in the [place]  A suicide crisis helpline, such as the National Suicide Prevention Lifeline at 864-547-2128. This is open 24 hours a day. Summary  Insomnia is a sleep disorder that makes it difficult to fall asleep or stay asleep.  Insomnia can be long-term (chronic) or short-term (acute).  Treatment for insomnia depends on the cause. Treatment may focus on treating an underlying condition that is causing insomnia.  Keep a sleep diary to help you and your health care provider figure out what could be causing your insomnia. This information is not intended to replace advice given to you by your health care provider. Make sure you discuss any questions you have with your health care provider. Document Revised: 01/23/2020 Document Reviewed: 01/23/2020 Elsevier Patient Education  2021 [employer].

## 2020-04-20 ENCOUNTER — Telehealth: Payer: Self-pay | Admitting: Neurology

## 2020-04-20 NOTE — Telephone Encounter (Signed)
PA completed for [employer] through cover my meds/ [employer]. [facility] PA Case: [id number],  Status: Approved, Coverage Starts on: 04/20/2020 12:00:00 AM, Coverage Ends on: 07/19/2020

## 2020-05-01 ENCOUNTER — Other Ambulatory Visit: Payer: Self-pay

## 2020-05-01 ENCOUNTER — Ambulatory Visit (INDEPENDENT_AMBULATORY_CARE_PROVIDER_SITE_OTHER): Payer: Medicare HMO | Admitting: Neurology

## 2020-05-01 DIAGNOSIS — Z720 Tobacco use: Secondary | ICD-10-CM

## 2020-05-01 DIAGNOSIS — I200 Unstable angina: Secondary | ICD-10-CM

## 2020-05-01 DIAGNOSIS — G4710 Hypersomnia, unspecified: Secondary | ICD-10-CM

## 2020-05-01 DIAGNOSIS — M48062 Spinal stenosis, lumbar region with neurogenic claudication: Secondary | ICD-10-CM

## 2020-05-01 DIAGNOSIS — I25810 Atherosclerosis of coronary artery bypass graft(s) without angina pectoris: Secondary | ICD-10-CM

## 2020-05-01 DIAGNOSIS — F5104 Psychophysiologic insomnia: Secondary | ICD-10-CM

## 2020-05-01 DIAGNOSIS — R002 Palpitations: Secondary | ICD-10-CM

## 2020-05-01 DIAGNOSIS — G4719 Other hypersomnia: Secondary | ICD-10-CM

## 2020-05-14 ENCOUNTER — Telehealth: Payer: Self-pay | Admitting: Neurology

## 2020-05-14 DIAGNOSIS — R002 Palpitations: Secondary | ICD-10-CM | POA: Insufficient documentation

## 2020-05-14 DIAGNOSIS — F5104 Psychophysiologic insomnia: Secondary | ICD-10-CM | POA: Insufficient documentation

## 2020-05-14 DIAGNOSIS — I25810 Atherosclerosis of coronary artery bypass graft(s) without angina pectoris: Secondary | ICD-10-CM | POA: Insufficient documentation

## 2020-05-14 DIAGNOSIS — G4719 Other hypersomnia: Secondary | ICD-10-CM | POA: Insufficient documentation

## 2020-05-14 DIAGNOSIS — F5101 Primary insomnia: Secondary | ICD-10-CM | POA: Insufficient documentation

## 2020-05-14 NOTE — Telephone Encounter (Signed)
-----   Message from Melvyn Novas, MD sent at 05/14/2020  3:06 PM EST -----  IMPRESSION:  1. Severely fragmented sleep but the cause was not recognized - no physiological cause within breathing, oxygen saturation, heart rate or REM sleep dream activity was noted.  2. No RESTLESS LEGS or PLMs were noted. 3. EKG was in keeping with normal sinus rhythm (NSR) and frequent PVCs.  4. There was very little REM sleep seen and no REM sleep behaviour was noted.    RECOMMENDATIONS: Unable to identify a physiologic sleep disorder. The patient can benefit from smoking cessation and implementation of best sleep habits and routines.  If pain contributes to sleep fragmentation, please refer for adequate therapy.     I certify that I have reviewed the entire raw data recording prior to the issuance of this report in accordance with the Standards of Accreditation of the American Academy of Sleep Medicine (AASM)

## 2020-05-14 NOTE — Progress Notes (Signed)
Thank you  for update

## 2020-05-14 NOTE — Procedures (Signed)
PATIENT'S NAME:  Regina Perry, Regina Perry DOB:      [birth year]      MR#:    [id number]     DATE OF RECORDING: 05/01/2020 by Christeen Douglas REFERRING M.D.:  Tessa Lerner, DO and Dr. Anne Hahn, MD  Study Performed:   Baseline Polysomnogram HISTORY:  Regina Perry is a 60 - year- old Caucasian female. Chief concern: Fragmented sleep- the first sleep period is 2-3 hours long and these intervals get shorter and shorter, I wake up feeling refreshed- but not for long and then I am fatigued all day, also waking with palpitations, galloping.  Had MI and bypass surgery 2020. No stent. Excessive daytime sleepiness reported. History of heart disease, heart attack in her fifties, CAD, bronchitis, pneumonia, Migraine.   The patient endorsed the Epworth Sleepiness Scale at 16 points.   The patient's weight 136 pounds with a height of 63 (inches), resulting in a BMI of 24.2 kg/m2. The patient's neck circumference measured 13 inches.  CURRENT MEDICATIONS: Xanax, Aspirin, Lexapro, Neurontin, Norco, Xyzal, Cozaar, Toprol-XL, Protonix, Phenergan, Imitrex, Vitamin D, Crestor   PROCEDURE:  This is a multichannel digital polysomnogram utilizing the Somnostar 11.2 system.  Electrodes and sensors were applied and monitored per AASM Specifications.   EEG, EOG, Chin and Limb EMG, were sampled at 200 Hz.  ECG, Snore and Nasal Pressure, Thermal Airflow, Respiratory Effort, CPAP Flow and Pressure, Oximetry was sampled at 50 Hz. Digital video and audio were recorded.      BASELINE STUDY: Lights Out was at 22:03 and Lights On at 05:00.  Total recording time (TRT) was 398 minutes, with a total sleep time (TST) of 328 minutes.   The patient's sleep latency was 37 minutes.  REM latency was 250.5 minutes.  The sleep efficiency was 82.4 %.     SLEEP ARCHITECTURE: WASO (Wake after sleep onset) was 45.5 minutes.  There were 40.5 minutes in Stage N1, 271 minutes Stage N2, 4.5 minutes Stage N3 and 12 minutes in Stage REM.  The percentage of Stage N1 was  12.3%, Stage N2 was 82.6%, Stage N3 was 1.4% and Stage R (REM sleep) was 3.7%.   RESPIRATORY ANALYSIS:  There were a total of 2 respiratory events:  0 obstructive apneas, 2 central apneas and 0 mixed apneas with a total of 2 apneas and an apnea index (AI) of .4 /hour. There were 0 hypopneas with a hypopnea index of 0 /hour.      The total APNEA/HYPOPNEA INDEX (AHI) was o.4/hour.  0 events occurred in REM sleep and 2 events in NREM. The REM AHI was  0 /hour, versus a non-REM AHI of 0.4/H. The patient spent 11 minutes of total sleep time in the supine position and 317 minutes in non-supine. The supine AHI was 5.5/H versus a non-supine AHI of 0.2.  OXYGEN SATURATION & C02:  The Wake baseline 02 saturation was 92%, with the lowest being 85%. Time spent below 89% saturation equaled 4 minutes.  The arousals were noted as: 28 were spontaneous, 0 were associated with PLMs, 0 were associated with respiratory events. The patient had a total of 0 Periodic Limb Movements.   Audio and video analysis did not show any abnormal or unusual movements, behaviors, phonations or vocalizations.  No bathroom breaks. Only intermittent and mild Snoring was noted. EKG was in keeping with normal sinus rhythm (NSR) and frequent PVCs.    IMPRESSION:  1. Severely fragmented sleep but the cause was not recognized - no physiological cause within breathing, oxygen  saturation, heart rate or REM sleep dream activity was noted.  2. No RESTLESS LEGS or PLMs were noted. 3. EKG was in keeping with normal sinus rhythm (NSR) and frequent PVCs.  4. There was very little REM sleep seen and no REM sleep behaviour was noted.    RECOMMENDATIONS: Unable to identify a physiologic sleep disorder. The patient can benefit from smoking cessation and implementation of best sleep habits and routines.     I certify that I have reviewed the entire raw data recording prior to the issuance of this report in accordance with the Standards of  Accreditation of the American Academy of Sleep Medicine (AASM)    Melvyn Novas, MD Diplomat, American Board of Psychiatry and Neurology  Diplomat, American Board of Sleep Medicine Wellsite geologist, [facility] [employer]

## 2020-05-14 NOTE — Telephone Encounter (Signed)
Called the patient to review her sleep study results.  Informed the patient that we noted fragmented sleep but was able to rule out that her sleep is being interrupted by any type of organic sleep disorder.  There was no evidence of restlessness in the extremities, low oxygen, apnea events.  Patient verbalized understanding was appreciated for the information.  Patient had no further questions.

## 2020-05-26 ENCOUNTER — Other Ambulatory Visit: Payer: Self-pay

## 2020-05-26 DIAGNOSIS — K219 Gastro-esophageal reflux disease without esophagitis: Secondary | ICD-10-CM

## 2020-05-26 MED ORDER — PANTOPRAZOLE SODIUM 40 MG PO TBEC: 40.0000 mg | DELAYED_RELEASE_TABLET | Freq: Every day | ORAL | 1 refills | Status: DC

## 2020-06-18 ENCOUNTER — Ambulatory Visit: Payer: Medicare HMO

## 2020-06-18 ENCOUNTER — Other Ambulatory Visit: Payer: Self-pay

## 2020-06-18 DIAGNOSIS — I6523 Occlusion and stenosis of bilateral carotid arteries: Secondary | ICD-10-CM

## 2020-06-19 NOTE — Progress Notes (Signed)
Please review

## 2020-06-23 ENCOUNTER — Other Ambulatory Visit: Payer: Medicare HMO

## 2020-07-06 ENCOUNTER — Encounter: Payer: Self-pay | Admitting: Cardiology

## 2020-07-06 ENCOUNTER — Ambulatory Visit: Payer: Medicare HMO | Admitting: Cardiology

## 2020-07-06 ENCOUNTER — Other Ambulatory Visit: Payer: Self-pay

## 2020-07-06 VITALS — BP 144/77 | HR 60 | Temp 98.0°F | Resp 16 | Ht 63.0 in | Wt 132.0 lb

## 2020-07-06 DIAGNOSIS — I10 Essential (primary) hypertension: Secondary | ICD-10-CM

## 2020-07-06 DIAGNOSIS — Z951 Presence of aortocoronary bypass graft: Secondary | ICD-10-CM

## 2020-07-06 DIAGNOSIS — Z87891 Personal history of nicotine dependence: Secondary | ICD-10-CM

## 2020-07-06 DIAGNOSIS — I2510 Atherosclerotic heart disease of native coronary artery without angina pectoris: Secondary | ICD-10-CM

## 2020-07-06 DIAGNOSIS — I6523 Occlusion and stenosis of bilateral carotid arteries: Secondary | ICD-10-CM

## 2020-07-06 DIAGNOSIS — E782 Mixed hyperlipidemia: Secondary | ICD-10-CM

## 2020-07-06 DIAGNOSIS — F17200 Nicotine dependence, unspecified, uncomplicated: Secondary | ICD-10-CM

## 2020-07-06 NOTE — Progress Notes (Signed)
Regina [facility] Date of Birth: July 10, 1960 MRN: [id number] Primary Care Provider:Gibson, Berline Lopes, FNP Former Cardiology Providers: Jeri Lager, APRN, FNP-C Primary Cardiologist: Rex Kras, DO, [facility] (established care 04/22/2019) Primary cardiothoracic surgery: Dr. Kipp Brood  Date: 07/06/20 Last Office Visit: 03/05/2020  Chief Complaint  Patient presents with  . Atherosclerosis of native coronary artery of native heart w  . Hypertension  . Follow-up    HPI  Regina [facility] is a 60 y.o.  female who presents to the office with a chief complaint of " management of CAD." Patient's past medical history and cardiovascular risk factors include: Hypertension, hyperlipidemia, tobacco use disorder, asymptomatic nonsustained ventricular tachycardia noted on event monitor in 2017, atherosclerosis of the native coronary arteries status post two-vessel CABG 12/2018, carotid artery atherosclerosis.   In the past patient was noted to have left main disease on coronary CTA and subsequently underwent emergent left heart catheterization which confirmed the findings and underwent coronary artery bypass grafting surgery in October 2020.  Status post surgery patient has done well.   Patient presents today for 88-monthfollow-up given her underlying heart disease, status post bypass, and carotid artery disease.  Patient has been working on lifestyle modifications given her multiple cardiovascular risk factors and prior history.  Since last visit she has been walking more on a daily basis as improved her diet.  And has lost approximately 5 pounds over the course of 3 months.  She is congratulated on her efforts.  Moreover, patient also states that she has not picked up a cigarette this entire day and plans to ask for assistance at her upcoming PCP visit for pharmacological therapy to help her with quitting smoking.  Since last office visit patient had a carotid duplex was also reviewed with her in great detail at  today's office visit.  She does have hematuria that is currently being managed by her primary care provider.  If it continues the plan is to be evaluated by urology.  Since last visit she also had a sleep study with Dr. DBrett Fairyand does not have obstructive sleep apnea.  FUNCTIONAL STATUS: She walks 7 acres daily.  ALLERGIES: Allergies  Allergen Reactions  . Percocet [Oxycodone-Acetaminophen] Other (See Comments)    Sick on stomach when taken with anesthesia    MEDICATION LIST PRIOR TO VISIT: Current Outpatient Medications on File Prior to Visit  Medication Sig Dispense Refill  . ALPRAZolam (XANAX) 0.5 MG tablet Take 0.5 mg by mouth in the morning and at bedtime.    .Marland Kitchenaspirin EC 81 MG tablet Take 1 tablet (81 mg total) by mouth daily. 90 tablet 3  . clopidogrel (PLAVIX) 75 MG tablet Take 75 mg by mouth daily.    .Marland Kitchenescitalopram (LEXAPRO) 20 MG tablet Take 20 mg by mouth daily.    .Marland Kitchengabapentin (NEURONTIN) 300 MG capsule Take 1 capsule (300 mg total) by mouth 3 (three) times daily. 90 capsule 4  . HYDROcodone-acetaminophen (NORCO) 10-325 MG tablet Take 1 tablet by mouth every 6 (six) hours as needed.    .Marland Kitchenlevocetirizine (XYZAL) 5 MG tablet Take 5 mg by mouth daily.    .Marland Kitchenlosartan (COZAAR) 25 MG tablet Take 25 mg by mouth every evening.    . metoprolol succinate (TOPROL-XL) 25 MG 24 hr tablet TAKE 1 TAB IN THE MORNING,HOLD IF SYSTOLIC BLOOD PRESSURE(TOP NUMBER) LESS THAN 100MMHG OR HEART RATE LESS THAN 60BPM (PULSE 90 tablet 0  . pantoprazole (PROTONIX) 40 MG tablet Take 1 tablet (40 mg total)  by mouth daily. 90 tablet 1  . promethazine (PHENERGAN) 25 MG tablet Take 25 mg every 6 (six) hours as needed by mouth for nausea or vomiting.    . rosuvastatin (CRESTOR) 40 MG tablet Take 1 tablet (40 mg total) by mouth daily. 90 tablet 3  . Vitamin D, Ergocalciferol, (DRISDOL) 1.25 MG (50000 UT) CAPS capsule Take 50,000 Units by mouth every 7 (seven) days.     No current facility-administered  medications on file prior to visit.    PAST MEDICAL HISTORY: Past Medical History:  Diagnosis Date  . Anxiety   . Bronchitis   . Depression   . Diverticulitis   . GERD (gastroesophageal reflux disease)   . H/O cesarean section   . History of hiatal hernia   . History of kidney stones    LEFT KIDNEY 2 STONES JUST WATCHING( [facility])  . Hypercholesteremia   . Hyperplastic colon polyp    12/30/2008  . Hypertension   . Migraine   . 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 Adrian Prows), prescribed verapamil  . Pneumonia   . PONV (postoperative nausea and vomiting)   . 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   CORONARY ARTER(CABG) X2  LIMA to LAD and SVG TO OM1 01/09/19     PAST SURGICAL HISTORY: Past Surgical History:  Procedure Laterality Date  . ABDOMINAL HYSTERECTOMY     partial  . CESAREAN SECTION    . COLONOSCOPY    . CORONARY ARTERY BYPASS GRAFT N/A 01/09/2019   Procedure: CORONARY ARTERY BYPASS GRAFTING (CABG) X2 USING LEFT INTERNAL MAMMARY ARTERY TO CIRC AND RIGHT GREATER SAPHENOUS VEIN TO LAD.;  Surgeon: Lajuana Matte, MD;  Location: [place];  Service: Open Heart Surgery;  Laterality: N/A;  . CORONARY ARTERY BYPASS GRAFT    . ETHMOIDECTOMY Bilateral 06/26/2014   Procedure: BILATERAL ANTERIOR ETHMOIDECTOMY;  Surgeon: Rozetta Nunnery, MD;  Location: [place];  Service: ENT;  Laterality: Bilateral;  . LEFT HEART CATH AND CORONARY ANGIOGRAPHY N/A 01/08/2019   Procedure: LEFT HEART CATH AND CORONARY ANGIOGRAPHY;  Surgeon: Nigel Mormon, MD;  Location: [place] [facility];  Service: Cardiovascular;  Laterality: N/A;  . MAXILLARY ANTROSTOMY Bilateral 06/26/2014   Procedure: BILATERAL MAXILLARY OSTEA ENLARGEMENT;  Surgeon: Rozetta Nunnery, MD;  Location: [place];  Service: ENT;  Laterality: Bilateral;  . NASAL SEPTOPLASTY W/ TURBINOPLASTY Bilateral  06/26/2014   Procedure: BILATERAL NASAL SEPTOPLASTY WITH TURBINATE REDUCTION;  Surgeon: Rozetta Nunnery, MD;  Location: [place];  Service: ENT;  Laterality: Bilateral;  . SHOULDER ARTHROSCOPY W/ ROTATOR CUFF REPAIR  6/15   right  . TEE WITHOUT CARDIOVERSION  01/09/2019   Procedure: Transesophageal Echocardiogram (Tee);  Surgeon: Lajuana Matte, MD;  Location: [place];  Service: Open Heart Surgery;;  . TONSILLECTOMY AND ADENOIDECTOMY    . TUBAL LIGATION      FAMILY HISTORY: The patient's family history includes AAA (abdominal aortic aneurysm) in her sister; Aneurysm in her mother; Breast cancer in her paternal grandmother; Cerebral aneurysm in her father; Heart disease in her mother; Irritable bowel syndrome in her mother; Lung cancer in her paternal grandfather; Migraines in her mother.   SOCIAL HISTORY:  The patient  reports that she has been smoking cigarettes. She started smoking about 18 months ago. She has a 8.75 pack-year smoking history. She has never used smokeless tobacco. She reports that  she does not drink alcohol and does not use drugs.  Review of Systems  Constitutional: Negative for chills and fever.  HENT: Negative for hoarse voice and nosebleeds.   Eyes: Negative for discharge, double vision and pain.  Cardiovascular: Negative for chest pain, claudication, dyspnea on exertion, leg swelling, near-syncope, orthopnea, palpitations, paroxysmal nocturnal dyspnea and syncope.  Respiratory: Negative for hemoptysis and shortness of breath.   Musculoskeletal: Negative for muscle cramps and myalgias.  Gastrointestinal: Negative for abdominal pain, constipation, diarrhea, hematemesis, hematochezia, melena, nausea and vomiting.  Genitourinary: Positive for hematuria (improved).  Neurological: Negative for light-headedness.   PHYSICAL EXAM: Vitals with BMI 07/06/2020 04/16/2020 03/05/2020  Height _0  _1  5' 2.5"  Weight 132 lbs 136 lbs 137 lbs  BMI 23.39  35.4 65.68  Systolic 127 517 001  Diastolic 77 86 72  Pulse 60 53 68   CONSTITUTIONAL: Well-developed and well-nourished. No acute distress.  SKIN: Skin is warm and dry. No rash noted. No cyanosis. No pallor. No jaundice HEAD: Normocephalic and atraumatic.  EYES: No scleral icterus MOUTH/THROAT: Moist oral membranes.  NECK: No JVD present. No thyromegaly noted. No carotid bruits  LYMPHATIC: No visible cervical adenopathy.  CHEST Normal respiratory effort. No intercostal retractions  LUNGS: Clear to auscultation bilaterally.  No stridor. No wheezes. No rales.  CARDIOVASCULAR: Regular rhythm, normal S1-S2, no murmurs rubs or gallops appreciated.  ABDOMINAL: No apparent ascites.  EXTREMITIES: No peripheral edema  HEMATOLOGIC: No significant bruising NEUROLOGIC: Oriented to person, place, and time. Nonfocal. Normal muscle tone.  PSYCHIATRIC: Normal mood and affect. Normal behavior. Cooperative  RADIOLOGY: Coronary CTA 12/25/18:  1. The left main is short and FFRct cannot be modeled in this region. 2. FFRct in the proximal LAD is mildly abnormal. However modeling demonstrates that opening the left main stenosis results in a significant improvement in flow in the proximal LAD. 3. FFRct demonstrates significant stenoses in the proximal and mid LAD. Based on modeling, opening the LM and proximal stenosis does not normalize flow more distally. 4. Recommend cardiac catheterization. Take note of concern for ostial left main disease.  CARDIAC DATABASE: Coronary artery bypass grafting: Year: 01/09/2019 Surgical anatomy: 2 vessel CABG: LIMA to LCx and SVG to LAD  EKG: 10/15/2019: Sinus bradycardia, 47 bpm, normal axis, poor R wave progression, without underlying ischemia or injury pattern.  Echocardiogram: 01/09/2019: LVEF 60-65%, mild LVH, trace MR, trace TR, mild plaque involving ascending aorta.  Stress Testing:  PCV MYOCARDIAL PERFUSION WITH LEXISCAN 11/25/2019 Lexiscan (Walking with  mod Bruce)Tetrofosmin Stress Test  11/25/2019: Walking Lexiscan protocol. Equivocal ECG stress. Resting EKG/ECG demonstrated normal sinus rhythm. Peak EKG revealed no ST-T wave abnormalities. Recovery EKG revealed 1 mm horizontal ST depression of the inferolateral leads. Normal myocardial perfusion. Gated SPECT imaging of the left ventricle was normal. All segments of left ventricle demonstrated normal wall motion and thickening. TID is normal. Stress LV EF is normal 63%. Compared to exercise nuclear stress in 07/20/2015, exercise EKG without ischemia and normal perfusion. Low risk. Clinical correlation recommended.  Heart Catheterization: Cath 01/08/2019:  LM: Mid calcified 90% stenosis. LAD: Mid LAD focal calfied 60% stenosis after D2 LCx: Normal RCA: Ostial calcification without significant disease  LVEF normal LVEDP mildly elevated at 21 mmHg  Carotid duplex: 06/18/2020: Stenosis in the right internal carotid artery (50-69%) with heterogenous plaque. Stenosis in the right external carotid artery are consistent with stenosis in the range of <50%  Minimal stenosis in the left internal carotid artery (1-15%). Antegrade right vertebral artery  flow. Antegrade left vertebral artery flow. Follow up in six months is appropriate if clinically indicated. No significant change from 12/30/2019 report.  LABORATORY DATA: CBC Latest Ref Rng & Units 11/13/2019 01/14/2019 01/13/2019  WBC 4.0 - 10.5 K/uL 7.3 11.5(H) 13.9(H)  Hemoglobin 12.0 - 15.0 g/dL 14.0 9.1(L) 9.4(L)  Hematocrit 36.0 - 46.0 % 43.3 27.0(L) 28.3(L)  Platelets 150 - 400 K/uL 195 197 171    CMP Latest Ref Rng & Units 03/09/2020 11/13/2019 01/14/2019  Glucose 65 - 99 mg/dL 100(H) 104(H) 107(H)  BUN 6 - 24 mg/dL _0 Creatinine 0.57 - 1.00 mg/dL 0.93 0.85 0.75  Sodium 134 - 144 mmol/L 145(H) 139 138  Potassium 3.5 - 5.2 mmol/L 4.6 4.4 3.6  Chloride 96 - 106 mmol/L 109(H) 109 99  CO2 20 - 29 mmol/L _1 Calcium 8.7 -  10.2 mg/dL 8.9 8.9 8.5(L)  Total Protein 6.0 - 8.5 g/dL 6.4 - -  Total Bilirubin 0.0 - 1.2 mg/dL 0.5 - -  Alkaline Phos 44 - 121 IU/L 69 - -  AST 0 - 40 IU/L 14 - -  ALT 0 - 32 IU/L 10 - -    Lipid Panel  Lab Results  Component Value Date   CHOL 137 03/09/2020   HDL 47 03/09/2020   LDLCALC 68 03/09/2020   LDLDIRECT 61 03/09/2020   TRIG 123 03/09/2020   CHOLHDL 4.7 04/10/2012     Lab Results  Component Value Date   HGBA1C 6.0 (H) 01/08/2019   HGBA1C 5.6 04/10/2012   No components found for: NTPROBNP No results found for: TSH  Cardiac Panel (last 3 results) No results for input(s): CKTOTAL, CKMB, TROPONINIHS, RELINDX in the last 72 hours.  IMPRESSION:    ICD-10-CM   1. Atherosclerosis of native coronary artery of native heart without angina pectoris  I25.10   2. Essential hypertension  I10 EKG 12-Lead  3. S/P CABG x 2  Z95.1   4. Mixed hyperlipidemia  E78.2   5. Tobacco use disorder  F17.200   6. Asymptomatic bilateral carotid artery stenosis  I65.23 PCV CAROTID DUPLEX (BILATERAL)    Lipid Panel With LDL/HDL Ratio    LDL cholesterol, direct    CMP14+EGFR  7. Former tobacco use  Z87.891      RECOMMENDATIONS: Regina [facility] is a 60 y.o. female whose past medical history and cardiovascular risk factors include: Hypertension, hyperlipidemia, tobacco use disorder, asymptomatic nonsustained ventricular tachycardia noted on event monitor in 2017, atherosclerosis of the native coronary arteries status post two-vessel CABG 12/2018.   Atherosclerosis of the native coronary artery without angina pectoris status post two-vessel CABG:  Medications reconciled.  Patient has implemented lifestyle modifications: Blood pressure better controlled, has lost 5 pounds since last visit, and also no cigarettes for over the last 24 hours.  She has been evaluated by sleep medicine and does not have sleep apnea.  Continue improve modifiable cardiovascular risk factors and adherence to  medication compliance.  Restart Plavix 75 mg p.o. daily.  If she continues to have hematuria may consider holding aspirin and continuing Plavix.  Two-vessel coronary artery bypass grafting surgery: Remains asymptomatic.  See above.  Educated on importance of aggressive risk factor modifications.  Atherosclerosis of carotid arteries:   Continue aspirin and statin therapy.    Most recent duplex reviewed.  Stenosis in the right ICA is greater than the left ICA.    1 year follow-up carotid duplex recommended.    Will check lipid profile  prior to the next office visit.  Benign essential hypertension: . Office blood pressure is improving and home BP have been at goal (120-168mmHg).  . Medication reconciled.  . Low salt diet recommended. A diet that is rich in fruits, vegetables, legumes, and low-fat dairy products and low in snacks, sweets, and meats (such as the Dietary Approaches to Stop Hypertension [DASH] diet).   Mixed hyperlipidemia: Continue Crestor, will check fasting lipid profile.  Cigarette smoking:   Tobacco cessation counseling:  Currently smoking 2 cigarettes/day.   Patient was informed of the dangers of tobacco abuse including stroke, cancer, and MI, as well as benefits of tobacco cessation.  Patient is willing to quit at this time.  Approximately 7 mins were spent counseling patient cessation techniques. We discussed various methods to help quit smoking, including deciding on a date to quit, joining a support group, pharmacological agents- nicotine gum/patch/lozenges, chantix.   I will reassess her progress at the next follow-up visit  FINAL MEDICATION LIST END OF ENCOUNTER: No orders of the defined types were placed in this encounter.   Current Outpatient Medications:  .  ALPRAZolam (XANAX) 0.5 MG tablet, Take 0.5 mg by mouth in the morning and at bedtime., Disp: , Rfl:  .  aspirin EC 81 MG tablet, Take 1 tablet (81 mg total) by mouth daily., Disp: 90 tablet,  Rfl: 3 .  clopidogrel (PLAVIX) 75 MG tablet, Take 75 mg by mouth daily., Disp: , Rfl:  .  escitalopram (LEXAPRO) 20 MG tablet, Take 20 mg by mouth daily., Disp: , Rfl:  .  gabapentin (NEURONTIN) 300 MG capsule, Take 1 capsule (300 mg total) by mouth 3 (three) times daily., Disp: 90 capsule, Rfl: 4 .  HYDROcodone-acetaminophen (NORCO) 10-325 MG tablet, Take 1 tablet by mouth every 6 (six) hours as needed., Disp: , Rfl:  .  levocetirizine (XYZAL) 5 MG tablet, Take 5 mg by mouth daily., Disp: , Rfl:  .  losartan (COZAAR) 25 MG tablet, Take 25 mg by mouth every evening., Disp: , Rfl:  .  metoprolol succinate (TOPROL-XL) 25 MG 24 hr tablet, TAKE 1 TAB IN THE MORNING,HOLD IF SYSTOLIC BLOOD PRESSURE(TOP NUMBER) LESS THAN OR HEART RATE LESS THAN 60BPM (PULSE, Disp: 90 tablet, Rfl: 0 .  pantoprazole (PROTONIX) 40 MG tablet, Take 1 tablet (40 mg total) by mouth daily., Disp: 90 tablet, Rfl: 1 .  promethazine (PHENERGAN) 25 MG tablet, Take 25 mg every 6 (six) hours as needed by mouth for nausea or vomiting., Disp: , Rfl:  .  rosuvastatin (CRESTOR) 40 MG tablet, Take 1 tablet (40 mg total) by mouth daily., Disp: 90 tablet, Rfl: 3 .  Vitamin D, Ergocalciferol, (DRISDOL) 1.25 MG (50000 UT) CAPS capsule, Take 50,000 Units by mouth every 7 (seven) days., Disp: , Rfl:   Orders Placed This Encounter  Procedures  . Lipid Panel With LDL/HDL Ratio  . LDL cholesterol, direct  . CMP14+EGFR  . EKG 12-Lead  . PCV CAROTID DUPLEX (BILATERAL)   --Continue cardiac medications as reconciled in final medication list. --Return in about 36 weeks (around 03/15/2021) for Follow up, CAD and carotid artery disease . Or sooner if needed. --Continue follow-up with your primary care physician regarding the management of your other chronic comorbid conditions.  Patient's questions and concerns were addressed to her satisfaction. She voices understanding of the instructions provided during this encounter.   This note was  created using a voice recognition software as a result there may be grammatical errors inadvertently enclosed that  do not reflect the nature of this encounter. Every attempt is made to correct such errors.  Total time spent: 35 minutes  Mechele Claude [facility]  Pager: [phone] Office: [phone]

## 2020-07-14 ENCOUNTER — Other Ambulatory Visit: Payer: Self-pay

## 2020-07-14 DIAGNOSIS — I6523 Occlusion and stenosis of bilateral carotid arteries: Secondary | ICD-10-CM

## 2020-07-16 ENCOUNTER — Telehealth: Payer: Self-pay

## 2020-07-16 NOTE — Telephone Encounter (Signed)
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 Patient mention last time you saw her there was a few blockage she is wondering if that will affect the aneurysm please advise

## 2020-07-16 NOTE — Telephone Encounter (Signed)
Please have her come in to go over the findings and have the records requested or she brings it in with her.

## 2020-07-17 NOTE — Telephone Encounter (Signed)
Patient has been scheduled

## 2020-07-20 ENCOUNTER — Other Ambulatory Visit: Payer: Self-pay

## 2020-07-20 ENCOUNTER — Ambulatory Visit: Payer: Medicare HMO | Admitting: Cardiology

## 2020-07-20 ENCOUNTER — Encounter: Payer: Self-pay | Admitting: Cardiology

## 2020-07-20 VITALS — BP 134/74 | HR 66 | Temp 98.0°F | Resp 16 | Ht 63.0 in | Wt 131.0 lb

## 2020-07-20 DIAGNOSIS — Z951 Presence of aortocoronary bypass graft: Secondary | ICD-10-CM

## 2020-07-20 DIAGNOSIS — I10 Essential (primary) hypertension: Secondary | ICD-10-CM

## 2020-07-20 DIAGNOSIS — I714 Abdominal aortic aneurysm, without rupture, unspecified: Secondary | ICD-10-CM

## 2020-07-20 DIAGNOSIS — I2510 Atherosclerotic heart disease of native coronary artery without angina pectoris: Secondary | ICD-10-CM

## 2020-07-20 DIAGNOSIS — F17200 Nicotine dependence, unspecified, uncomplicated: Secondary | ICD-10-CM

## 2020-07-20 DIAGNOSIS — Z87891 Personal history of nicotine dependence: Secondary | ICD-10-CM

## 2020-07-20 DIAGNOSIS — I6523 Occlusion and stenosis of bilateral carotid arteries: Secondary | ICD-10-CM

## 2020-07-20 DIAGNOSIS — E782 Mixed hyperlipidemia: Secondary | ICD-10-CM

## 2020-07-20 NOTE — Progress Notes (Signed)
Regina SanesLisa J Perry Date of Birth: 02/18/1961 MRN: [id number] Primary Care Provider:Gibson, Christena DeemHeather C, FNP Former Cardiology Providers: Altamese CarolinaAshton Kelley, APRN, FNP-C Primary Cardiologist: Tessa LernerSunit Kaley Jutras, DO, [facility] (established care 04/22/2019) Primary cardiothoracic surgery: Dr. Cliffton AstersLightfoot  Date: 07/20/20 Last Office Visit: 07/06/2020  Chief Complaint  Patient presents with  . Atherosclerosis of native coronary artery of native heart w  . Results  . Follow-up    HPI  Regina SanesLisa J Perry is a 60 y.o.  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   In the past given her symptoms of chest pain she underwent a coronary CTA was found to have left main disease.  Subsequently underwent left heart catheterization was noted to have obstructive CAD and thereafter underwent two-vessel CABG in October 2020.  Since then she has been following in the clinic for management of her established CAD and multiple cardiovascular risk factors.  She was recently seen in April 2022 and comes in sooner than her scheduled visit because of an abnormal CT scan.  Patient's been having symptoms of hematuria and has been following with urology and had a abdominal x-ray and a CT scan.  Patient states that she is noted to have an abdominal aortic aneurysm and wants further recommendations.  I do not have any results reviewed with her at today's office visit but we have requested records and currently waiting.  Patient denies any abdominal pain, no retroperitoneal discomfort, no change in urinary patterns, and no syncopal events.  She continues  to smoke at least 6 cigarettes/day despite having CAD status post CABG, carotid disease, and now abdominal aortic aneurysm.  She has been educated multiple times on the importance of complete smoking cessation.  Since last visit she also had a sleep study with Dr. Vickey Hugerohmeier and does not have obstructive sleep apnea.  FUNCTIONAL STATUS: She walks 7 acres daily.  ALLERGIES: Allergies  Allergen Reactions  . Percocet [Oxycodone-Acetaminophen] Other (See Comments)    Sick on stomach when taken with anesthesia    MEDICATION LIST PRIOR TO VISIT: Current Outpatient Medications on File Prior to Visit  Medication Sig Dispense Refill  . ALPRAZolam (XANAX) 0.5 MG tablet Take 0.5 mg by mouth in the morning and at bedtime.    Marland Kitchen. aspirin EC 81 MG tablet Take 1 tablet (81 mg total) by mouth daily. 90 tablet 3  . clopidogrel (PLAVIX) 75 MG tablet Take 75 mg by mouth daily.    Marland Kitchen. escitalopram (LEXAPRO) 20 MG tablet Take 20 mg by mouth daily.    Marland Kitchen. gabapentin (NEURONTIN) 300 MG capsule Take 1 capsule (300 mg total) by mouth 3 (three) times daily. 90 capsule 4  . HYDROcodone-acetaminophen (NORCO) 10-325 MG tablet Take 1 tablet by mouth every 6 (six) hours as needed.    Marland Kitchen. levocetirizine (XYZAL) 5 MG tablet Take 5 mg by mouth daily.    Marland Kitchen. losartan (COZAAR) 25 MG tablet Take 25 mg by mouth every evening.    . metoprolol succinate (TOPROL-XL) 25 MG 24 hr tablet TAKE 1 TAB IN THE MORNING,HOLD IF SYSTOLIC BLOOD PRESSURE(TOP NUMBER) LESS THAN 100MMHG OR HEART RATE LESS THAN 60BPM (PULSE 90 tablet 0  . pantoprazole (PROTONIX) 40 MG tablet Take 1 tablet (40  mg total) by mouth daily. 90 tablet 1  . promethazine (PHENERGAN) 25 MG tablet Take 25 mg every 6 (six) hours as needed by mouth for nausea or vomiting.    . rosuvastatin (CRESTOR) 40 MG tablet Take 1 tablet (40 mg total) by mouth daily. 90 tablet 3  . Vitamin D, Ergocalciferol, (DRISDOL) 1.25 MG (50000 UT) CAPS capsule Take 50,000 Units by mouth every 7 (seven) days.      No current facility-administered medications on file prior to visit.    PAST MEDICAL HISTORY: Past Medical History:  Diagnosis Date  . Anxiety   . Bronchitis   . Depression   . Diverticulitis   . GERD (gastroesophageal reflux disease)   . H/O cesarean section   . History of hiatal hernia   . History of kidney stones    LEFT KIDNEY 2 STONES JUST WATCHING( [facility])  . Hypercholesteremia   . Hyperplastic colon polyp    12/30/2008  . Hypertension   . Migraine   . 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 Yates Decamp), prescribed verapamil  . Pneumonia   . PONV (postoperative nausea and vomiting)   . 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   CORONARY ARTER(CABG) X2  LIMA to LAD and SVG TO OM1 01/09/19     PAST SURGICAL HISTORY: Past Surgical History:  Procedure Laterality Date  . ABDOMINAL HYSTERECTOMY     partial  . CESAREAN SECTION    . COLONOSCOPY    . CORONARY ARTERY BYPASS GRAFT N/A 01/09/2019   Procedure: CORONARY ARTERY BYPASS GRAFTING (CABG) X2 USING LEFT INTERNAL MAMMARY ARTERY TO CIRC AND RIGHT GREATER SAPHENOUS VEIN TO LAD.;  Surgeon: Corliss Skains, MD;  Location: [facility] OR;  Service: Open Heart Surgery;  Laterality: N/A;  . CORONARY ARTERY BYPASS GRAFT    . ETHMOIDECTOMY Bilateral 06/26/2014   Procedure: BILATERAL ANTERIOR ETHMOIDECTOMY;  Surgeon: Drema Halon, MD;  Location: [facility];  Service: ENT;  Laterality: Bilateral;  . LEFT HEART CATH AND CORONARY ANGIOGRAPHY N/A 01/08/2019   Procedure: LEFT HEART CATH AND CORONARY ANGIOGRAPHY;  Surgeon: Elder Negus, MD;  Location: [facility];  Service: Cardiovascular;  Laterality: N/A;  . MAXILLARY ANTROSTOMY Bilateral 06/26/2014   Procedure: BILATERAL MAXILLARY OSTEA ENLARGEMENT;  Surgeon: Drema Halon, MD;  Location: [facility];  Service: ENT;  Laterality: Bilateral;  . NASAL  SEPTOPLASTY W/ TURBINOPLASTY Bilateral 06/26/2014   Procedure: BILATERAL NASAL SEPTOPLASTY WITH TURBINATE REDUCTION;  Surgeon: Drema Halon, MD;  Location: [facility];  Service: ENT;  Laterality: Bilateral;  . SHOULDER ARTHROSCOPY W/ ROTATOR CUFF REPAIR  6/15   right  . TEE WITHOUT CARDIOVERSION  01/09/2019   Procedure: Transesophageal Echocardiogram (Tee);  Surgeon: Corliss Skains, MD;  Location: [facility] OR;  Service: Open Heart Surgery;;  . TONSILLECTOMY AND ADENOIDECTOMY    . TUBAL LIGATION      FAMILY HISTORY: The patient's family history includes AAA (abdominal aortic aneurysm) in her sister; Aneurysm in her mother; Breast cancer in her paternal grandmother; Cerebral aneurysm in her father; Heart disease in her mother; Irritable bowel syndrome in her mother; Lung cancer in her paternal grandfather; Migraines in her mother.   SOCIAL HISTORY:  The patient  reports that she has been smoking cigarettes. She started smoking about 18 months ago. She has a 8.75 pack-year smoking history. She has never used smokeless tobacco. She  reports that she does not drink alcohol and does not use drugs.  Review of Systems  Constitutional: Negative for chills and fever.  HENT: Negative for hoarse voice and nosebleeds.   Eyes: Negative for discharge, double vision and pain.  Cardiovascular: Negative for chest pain, claudication, dyspnea on exertion, leg swelling, near-syncope, orthopnea, palpitations, paroxysmal nocturnal dyspnea and syncope.  Respiratory: Negative for hemoptysis and shortness of breath.   Musculoskeletal: Negative for muscle cramps and myalgias.  Gastrointestinal: Negative for abdominal pain, constipation, diarrhea, hematemesis, hematochezia, melena, nausea and vomiting.  Genitourinary: Positive for hematuria (improved).  Neurological: Negative for light-headedness.   PHYSICAL EXAM: Vitals with BMI 07/20/2020 07/06/2020 04/16/2020  Height 5\' 3"  5\' 3"  5\' 3"   Weight  131 lbs 132 lbs 136 lbs  BMI 23.21 23.39 24.1  Systolic 134 144  Diastolic 74 77 86  Pulse 66 60 53   CONSTITUTIONAL: Well-developed and well-nourished. No acute distress.  SKIN: Skin is warm and dry. No rash noted. No cyanosis. No pallor. No jaundice HEAD: Normocephalic and atraumatic.  EYES: No scleral icterus MOUTH/THROAT: Moist oral membranes.  NECK: No JVD present. No thyromegaly noted. No carotid bruits  LYMPHATIC: No visible cervical adenopathy.  CHEST Normal respiratory effort. No intercostal retractions  LUNGS: Clear to auscultation bilaterally.  No stridor. No wheezes. No rales.  CARDIOVASCULAR: Regular rhythm, normal S1-S2, no murmurs rubs or gallops appreciated.  ABDOMINAL: Nonobese, soft, nontender, nondistended, positive bowel sounds in all 4 quadrants, no abdominal bruits, no apparent ascites.  EXTREMITIES: No peripheral edema  HEMATOLOGIC: No significant bruising NEUROLOGIC: Oriented to person, place, and time. Nonfocal. Normal muscle tone.  PSYCHIATRIC: Normal mood and affect. Normal behavior. Cooperative  RADIOLOGY: Coronary CTA 12/25/18:  1. The left main is short and FFRct cannot be modeled in this region. 2. FFRct in the proximal LAD is mildly abnormal. However modeling demonstrates that opening the left main stenosis results in a significant improvement in flow in the proximal LAD. 3. FFRct demonstrates significant stenoses in the proximal and mid LAD. Based on modeling, opening the LM and proximal stenosis does not normalize flow more distally. 4. Recommend cardiac catheterization. Take note of concern for ostial left main disease.  CARDIAC DATABASE: Coronary artery bypass grafting: Year: 01/09/2019 Surgical anatomy: 2 vessel CABG: LIMA to LCx and SVG to LAD  EKG: 10/15/2019: Sinus bradycardia, 47 bpm, normal axis, poor R wave progression, without underlying ischemia or injury pattern.  Echocardiogram: 01/09/2019: LVEF 60-65%, mild LVH, trace MR, trace  TR, mild plaque involving ascending aorta.  Stress Testing:  PCV MYOCARDIAL PERFUSION WITH LEXISCAN 11/25/2019 Lexiscan (Walking with mod Bruce)Tetrofosmin Stress Test  11/25/2019: Walking Lexiscan protocol. Equivocal ECG stress. Resting EKG/ECG demonstrated normal sinus rhythm. Peak EKG revealed no ST-T wave abnormalities. Recovery EKG revealed 1 mm horizontal ST depression of the inferolateral leads. Normal myocardial perfusion. Gated SPECT imaging of the left ventricle was normal. All segments of left ventricle demonstrated normal wall motion and thickening. TID is normal. Stress LV EF is normal 63%. Compared to exercise nuclear stress in 07/20/2015, exercise EKG without ischemia and normal perfusion. Low risk. Clinical correlation recommended.  Heart Catheterization: Cath 01/08/2019:  LM: Mid calcified 90% stenosis. LAD: Mid LAD focal calfied 60% stenosis after D2 LCx: Normal RCA: Ostial calcification without significant disease  LVEF normal LVEDP mildly elevated at 21 mmHg  Carotid duplex: 06/18/2020: Stenosis in the right internal carotid artery (50-69%) with heterogenous plaque. Stenosis in the right external carotid artery are consistent with stenosis in the range  of <50%  Minimal stenosis in the left internal carotid artery (1-15%). Antegrade right vertebral artery flow. Antegrade left vertebral artery flow. Follow up in six months is appropriate if clinically indicated. No significant change from 12/30/2019 report.  LABORATORY DATA: CBC Latest Ref Rng & Units 11/13/2019 01/14/2019 01/13/2019  WBC 4.0 - 10.5 K/uL 7.3 11.5(H) 13.9(H)  Hemoglobin 12.0 - 15.0 g/dL 16.1 0.9(U) 0.4(V)  Hematocrit 36.0 - 46.0 % 43.3 27.0(L) 28.3(L)  Platelets 150 - 400 K/uL 195 197 171    CMP Latest Ref Rng & Units 03/09/2020 11/13/2019 01/14/2019  Glucose 65 - 99 mg/dL 409(W) 119(J) 478(G)  BUN 6 - 24 mg/dL Creatinine 0.57 - 1.00 mg/dL 9.56 2.13 0.86  Sodium 134 - 144 mmol/L  145(H) 139 138  Potassium 3.5 - 5.2 mmol/L 4.6 4.4 3.6  Chloride 96 - 106 mmol/L 109(H) 109 99  CO2 20 - 29 mmol/L Calcium 8.7 - 10.2 mg/dL 8.9 8.9 5.7(Q)  Total Protein 6.0 - 8.5 g/dL 6.4 - -  Total Bilirubin 0.0 - 1.2 mg/dL 0.5 - -  Alkaline Phos 44 - 121 IU/L 69 - -  AST 0 - 40 IU/L 14 - -  ALT 0 - 32 IU/L 10 - -    Lipid Panel  Lab Results  Component Value Date   CHOL 137 03/09/2020   HDL 47 03/09/2020   LDLCALC 68 03/09/2020   LDLDIRECT 61 03/09/2020   TRIG 123 03/09/2020   CHOLHDL 4.7 04/10/2012     Lab Results  Component Value Date   HGBA1C 6.0 (H) 01/08/2019   HGBA1C 5.6 04/10/2012   No components found for: NTPROBNP No results found for: TSH  Cardiac Panel (last 3 results) No results for input(s): CKTOTAL, CKMB, TROPONINIHS, RELINDX in the last 72 hours.  IMPRESSION:    ICD-10-CM   1. Asymptomatic bilateral carotid artery stenosis  I65.23   2. AAA (abdominal aortic aneurysm) without rupture (HCC)  I71.4 PCV AORTA DUPLEX  3. Essential hypertension  I10   4. Atherosclerosis of native coronary artery of native heart without angina pectoris  I25.10   5. S/P CABG x 2  Z95.1   6. Mixed hyperlipidemia  E78.2   7. Tobacco use disorder  F17.200   8. Former tobacco use  Z87.891      RECOMMENDATIONS: Regina Perry is a 60 y.o. female whose past medical history and cardiovascular risk factors include: Hypertension, hyperlipidemia, tobacco use disorder, asymptomatic nonsustained ventricular tachycardia noted on event monitor in 2017, atherosclerosis of the native coronary arteries status post two-vessel CABG 12/2018.   Atherosclerosis of the native coronary artery without angina pectoris status post two-vessel CABG:  Denies anginal discomfort.  Medications reconciled.  Educated on importance of lifestyle modifications with better blood pressure management, weight loss, and lipid management.    She has been evaluated by sleep medicine and does not have  sleep apnea.  Continue improve modifiable cardiovascular risk factors and adherence to medication compliance.  Two-vessel coronary artery bypass grafting surgery: Remains asymptomatic.  See above.  Educated on importance of aggressive risk factor modifications.  Abdominal aortic aneurysm:  Initially discovered on a recent CT performed at an outside facility.  Obtaining records  Educated on the importance of complete smoking cessation.  Blood pressure management.  Lipid management-last LDL less than 70 mg/dL  Educated on his symptoms of abdominal aortic aneurysm rupture.  Patient is encouraged to go to the hospital if any of the symptoms  surface.  We will repeat abdominal aortic ultrasound in 6 months to reevaluate disease progression.  Atherosclerosis of carotid arteries:   Continue aspirin and statin therapy.    Most recent duplex reviewed.  Stenosis in the right ICA is greater than the left ICA.    Carotid duplex will be scheduled in 6 months.  Will check lipid profile prior to the next office visit.  Benign essential hypertension: . Office blood pressure is improving and home BP have been at goal (120-161mmHg).  . Medication reconciled.  . Low salt diet recommended. A diet that is rich in fruits, vegetables, legumes, and low-fat dairy products and low in snacks, sweets, and meats (such as the Dietary Approaches to Stop Hypertension [DASH] diet).   Mixed hyperlipidemia: Continue Crestor, will check fasting lipid profile.  Cigarette smoking:   Tobacco cessation counseling:  Currently smoking 6 cigarettes/day.   Patient was informed of the dangers of tobacco abuse including stroke, cancer, and MI, as well as benefits of tobacco cessation.  Patient is willing to quit at this time.  Approximately 9 mins were spent counseling patient cessation techniques. We discussed various methods to help quit smoking, including deciding on a date to quit, joining a support group,  pharmacological agents- nicotine gum/patch/lozenges, chantix.   I will reassess her progress at the next follow-up visit  Patient is currently undergoing work-up for her hematuria.  Most likely secondary to nephrolithiasis.  However given her extensive history of smoking I have asked her to discuss the possibility of bladder cancer with her urologist as one of the differential diagnosis if hematuria continues despite resolution of nephrolithiasis.  Both the patient and husband verbalized understanding.  FINAL MEDICATION LIST END OF ENCOUNTER: No orders of the defined types were placed in this encounter.   Current Outpatient Medications:  .  ALPRAZolam (XANAX) 0.5 MG tablet, Take 0.5 mg by mouth in the morning and at bedtime., Disp: , Rfl:  .  aspirin EC 81 MG tablet, Take 1 tablet (81 mg total) by mouth daily., Disp: 90 tablet, Rfl: 3 .  clopidogrel (PLAVIX) 75 MG tablet, Take 75 mg by mouth daily., Disp: , Rfl:  .  escitalopram (LEXAPRO) 20 MG tablet, Take 20 mg by mouth daily., Disp: , Rfl:  .  gabapentin (NEURONTIN) 300 MG capsule, Take 1 capsule (300 mg total) by mouth 3 (three) times daily., Disp: 90 capsule, Rfl: 4 .  HYDROcodone-acetaminophen (NORCO) 10-325 MG tablet, Take 1 tablet by mouth every 6 (six) hours as needed., Disp: , Rfl:  .  levocetirizine (XYZAL) 5 MG tablet, Take 5 mg by mouth daily., Disp: , Rfl:  .  losartan (COZAAR) 25 MG tablet, Take 25 mg by mouth every evening., Disp: , Rfl:  .  metoprolol succinate (TOPROL-XL) 25 MG 24 hr tablet, TAKE 1 TAB IN THE MORNING,HOLD IF SYSTOLIC BLOOD PRESSURE(TOP NUMBER) LESS THAN OR HEART RATE LESS THAN 60BPM (PULSE, Disp: 90 tablet, Rfl: 0 .  pantoprazole (PROTONIX) 40 MG tablet, Take 1 tablet (40 mg total) by mouth daily., Disp: 90 tablet, Rfl: 1 .  promethazine (PHENERGAN) 25 MG tablet, Take 25 mg every 6 (six) hours as needed by mouth for nausea or vomiting., Disp: , Rfl:  .  rosuvastatin (CRESTOR) 40 MG tablet, Take 1  tablet (40 mg total) by mouth daily., Disp: 90 tablet, Rfl: 3 .  Vitamin D, Ergocalciferol, (DRISDOL) 1.25 MG (50000 UT) CAPS capsule, Take 50,000 Units by mouth every 7 (seven) days., Disp: , Rfl:  Orders Placed This Encounter  Procedures  . PCV AORTA DUPLEX   --Continue cardiac medications as reconciled in final medication list. --Return in about 8 months (around 03/21/2021) for Follow up CAD, Carotid, and AAA. . Or sooner if needed. --Continue follow-up with your primary care physician regarding the management of your other chronic comorbid conditions.  Patient's questions and concerns were addressed to her satisfaction. She voices understanding of the instructions provided during this encounter.   This note was created using a voice recognition software as a result there may be grammatical errors inadvertently enclosed that do not reflect the nature of this encounter. Every attempt is made to correct such errors.  Total time spent: 35 minutes  Delilah Shan [facility]  Pager: [phone] Office: [phone]

## 2020-08-27 ENCOUNTER — Emergency Department
Admission: EM | Admit: 2020-08-27 | Discharge: 2020-08-27 | Disposition: A | Discharge status: Home / Self Care | Payer: Medicare HMO | Attending: Emergency Medicine | Admitting: Emergency Medicine

## 2020-08-27 ENCOUNTER — Other Ambulatory Visit: Payer: Self-pay

## 2020-08-27 DIAGNOSIS — Z79899 Other long term (current) drug therapy: Secondary | ICD-10-CM | POA: Diagnosis not present

## 2020-08-27 DIAGNOSIS — I119 Hypertensive heart disease without heart failure: Secondary | ICD-10-CM | POA: Diagnosis not present

## 2020-08-27 DIAGNOSIS — Z7982 Long term (current) use of aspirin: Secondary | ICD-10-CM | POA: Diagnosis not present

## 2020-08-27 DIAGNOSIS — W57XXXA Bitten or stung by nonvenomous insect and other nonvenomous arthropods, initial encounter: Secondary | ICD-10-CM | POA: Diagnosis not present

## 2020-08-27 DIAGNOSIS — I2510 Atherosclerotic heart disease of native coronary artery without angina pectoris: Secondary | ICD-10-CM | POA: Diagnosis not present

## 2020-08-27 DIAGNOSIS — S40261A Insect bite (nonvenomous) of right shoulder, initial encounter: Secondary | ICD-10-CM | POA: Insufficient documentation

## 2020-08-27 DIAGNOSIS — F17200 Nicotine dependence, unspecified, uncomplicated: Secondary | ICD-10-CM | POA: Diagnosis not present

## 2020-08-27 DIAGNOSIS — Z951 Presence of aortocoronary bypass graft: Secondary | ICD-10-CM | POA: Diagnosis not present

## 2020-08-27 MED ORDER — DOXYCYCLINE HYCLATE 100 MG PO TABS
200.0000 mg | ORAL_TABLET | Freq: Once | ORAL | Status: AC
  Administered 2020-08-27: 200 mg via ORAL
  Filled 2020-08-27: qty 2

## 2020-08-27 NOTE — ED Provider Notes (Signed)
[facility] Emergency Department Provider Note  ____________________________________________  Time seen: Approximately 4:51 PM  I have reviewed the triage vital signs and the nursing notes.   HISTORY  Chief Complaint Tick Removal    HPI Regina Perry is a 60 y.o. female who presents the emergency department for removal of a tick.  Patient states that she noticed a tick which she is unsure how long its been on her to the right axillary region.  She states that she tried to remove it at home but got the body of the tick without removing the head.  Patient states that given the location and trying to use her nondominant hand she was unable to remove the head of the tick.  She has no systemic complaints, is here for removal of remaining portion of the tick at this time.  Medical history as described below but no complaints with chronic medical problems.         Past Medical History:  Diagnosis Date  . Anxiety   . Bronchitis   . Depression   . Diverticulitis   . GERD (gastroesophageal reflux disease)   . H/O cesarean section   . History of hiatal hernia   . History of kidney stones    LEFT KIDNEY 2 STONES JUST WATCHING( [facility])  . Hypercholesteremia   . Hyperplastic colon polyp    12/30/2008  . Hypertension   . Migraine   . 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 Yates Decamp), prescribed verapamil  . Pneumonia   . PONV (postoperative nausea and vomiting)   . 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 Heart palpitations 05/14/2020  . Psychophysiological insomnia 05/14/2020  . Coronary artery disease involving coronary bypass graft of native heart 05/14/2020  . Excessive daytime sleepiness 05/14/2020  . 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 Unstable angina (HCC)  01/08/2019  . Lumbar stenosis with neurogenic claudication 02/06/2017  . Bilateral sciatica 05/19/2015  . Chest pain at rest 04/09/2012  . Hypertension 04/09/2012  . Hyperlipidemia 04/09/2012  . Tobacco abuse 04/09/2012    Past Surgical History:  Procedure Laterality Date  . ABDOMINAL HYSTERECTOMY     partial  . CESAREAN SECTION    . COLONOSCOPY    . CORONARY ARTERY BYPASS GRAFT N/A 01/09/2019   Procedure: CORONARY ARTERY BYPASS GRAFTING (CABG) X2 USING LEFT INTERNAL MAMMARY ARTERY TO CIRC AND RIGHT GREATER SAPHENOUS VEIN TO LAD.;  Surgeon: Corliss Skains, MD;  Location: [facility] OR;  Service: Open Heart Surgery;  Laterality: N/A;  . CORONARY ARTERY BYPASS GRAFT    . ETHMOIDECTOMY Bilateral 06/26/2014   Procedure: BILATERAL ANTERIOR ETHMOIDECTOMY;  Surgeon: Drema Halon, MD;  Location: [facility];  Service: ENT;  Laterality: Bilateral;  . LEFT HEART CATH AND CORONARY ANGIOGRAPHY N/A 01/08/2019   Procedure: LEFT HEART CATH AND CORONARY ANGIOGRAPHY;  Surgeon: Elder Negus, MD;  Location: [facility];  Service: Cardiovascular;  Laterality: N/A;  . MAXILLARY ANTROSTOMY Bilateral 06/26/2014   Procedure: BILATERAL MAXILLARY OSTEA ENLARGEMENT;  Surgeon: Drema Halon, MD;  Location: [facility];  Service: ENT;  Laterality: Bilateral;  . NASAL SEPTOPLASTY W/ TURBINOPLASTY Bilateral 06/26/2014   Procedure: BILATERAL NASAL SEPTOPLASTY WITH TURBINATE REDUCTION;  Surgeon:  Drema Halon, MD;  Location: [facility];  Service: ENT;  Laterality: Bilateral;  . SHOULDER ARTHROSCOPY W/ ROTATOR CUFF REPAIR  6/15   right  . TEE WITHOUT CARDIOVERSION  01/09/2019   Procedure: Transesophageal Echocardiogram (Tee);  Surgeon: Corliss Skains, MD;  Location: [facility] OR;  Service: Open Heart Surgery;;  . TONSILLECTOMY AND ADENOIDECTOMY    . TUBAL LIGATION      Prior to Admission medications   Medication Sig Start Date End Date  Taking? Authorizing Provider  ALPRAZolam Prudy Feeler) 0.5 MG tablet Take 0.5 mg by mouth in the morning and at bedtime. 08/19/19   [provider]  aspirin EC 81 MG tablet Take 1 tablet (81 mg total) by mouth daily. 02/19/19   Toniann Fail, NP  clopidogrel (PLAVIX) 75 MG tablet Take 75 mg by mouth daily.    [provider]  escitalopram (LEXAPRO) 20 MG tablet Take 20 mg by mouth daily. 08/20/19   [provider]  gabapentin (NEURONTIN) 300 MG capsule Take 1 capsule (300 mg total) by mouth 3 (three) times daily. 09/17/15   York Spaniel, MD  HYDROcodone-acetaminophen [facility]) 10-325 MG tablet Take 1 tablet by mouth every 6 (six) hours as needed.    [provider]  levocetirizine (XYZAL) 5 MG tablet Take 5 mg by mouth daily. 08/20/19   [provider]  losartan (COZAAR) 25 MG tablet Take 25 mg by mouth every evening.    [provider]  metoprolol succinate (TOPROL-XL) 25 MG 24 hr tablet TAKE 1 TAB IN THE MORNING,HOLD IF SYSTOLIC BLOOD PRESSURE(TOP NUMBER) LESS THAN OR HEART RATE LESS THAN 60BPM (PULSE 04/16/20   Tolia, Sunit, DO  pantoprazole (PROTONIX) 40 MG tablet Take 1 tablet (40 mg total) by mouth daily. 05/26/20   Tolia, Sunit, DO  promethazine (PHENERGAN) 25 MG tablet Take 25 mg every 6 (six) hours as needed by mouth for nausea or vomiting.    [provider]  rosuvastatin (CRESTOR) 40 MG tablet Take 1 tablet (40 mg total) by mouth daily. 10/15/19 01/13/20  Tolia, Sunit, DO  Vitamin D, Ergocalciferol, (DRISDOL) 1.25 MG (50000 UT) CAPS capsule Take 50,000 Units by mouth every 7 (seven) days.    [provider]    Allergies Percocet [oxycodone-acetaminophen]  Family History  Problem Relation Age of Onset  . Heart disease Mother        MI  . Irritable bowel syndrome Mother   . Migraines Mother   . Aneurysm Mother        brain  . Cerebral aneurysm Father   . AAA (abdominal aortic aneurysm) Sister   . Breast  cancer Paternal Grandmother   . Lung cancer Paternal Grandfather   . Colon cancer Neg Hx   . Esophageal cancer Neg Hx   . Rectal cancer Neg Hx   . Stomach cancer Neg Hx     Social History Social History   Tobacco Use  . Smoking status: Current Some Day Smoker    Packs/day: 0.25    Years: 35.00    Pack years: 8.75    Types: Cigarettes    Start date: 12/31/2018    Last attempt to quit: 12/31/2018    Years since quitting: 1.6  . Smokeless tobacco: Never Used  Vaping Use  . Vaping Use: Never used  Substance Use Topics  . Alcohol use: No  . Drug use: No     Review of Systems  Constitutional: No fever/chills Eyes: No visual changes.  No discharge ENT: No upper respiratory complaints. Cardiovascular: no chest pain. Respiratory: no cough. No SOB. Gastrointestinal: No abdominal pain.  No nausea, no vomiting.  No diarrhea.  No constipation. Musculoskeletal: Negative for musculoskeletal pain. Skin: Embedded portion of tick remaining to the right axilla Neurological: Negative for headaches, focal weakness or numbness.  10 System ROS otherwise negative.  ____________________________________________   PHYSICAL EXAM:  VITAL SIGNS: ED Triage Vitals  Enc Vitals Group     BP 08/27/20 1534 (!) 149/74     Pulse Rate 08/27/20 1534 64     Resp 08/27/20 1534 16     Temp 08/27/20 1534 97.7 F (36.5 C)     Temp Source 08/27/20 1534 Oral     SpO2 08/27/20 1534 95 %     Weight 08/27/20 1535 131 lb (59.4 kg)     Height 08/27/20 1535 5\' 3"  (1.6 m)     Head Circumference --      Peak Flow --      Pain Score 08/27/20 1535 8     Pain Loc --      Pain Edu? --      Excl. in GC? --      Constitutional: Alert and oriented. Well appearing and in no acute distress. Eyes: Conjunctivae are normal. PERRL. EOMI. Head: Atraumatic. ENT:      Ears:       Nose: No congestion/rhinnorhea.      Mouth/Throat: Mucous membranes are moist.  Neck: No stridor.    Hematological/Lymphatic/Immunilogical: No cervical lymphadenopathy.  No axillary lymphadenopathy Cardiovascular: Normal rate, regular rhythm. Normal S1 and S2.  Good peripheral circulation. Respiratory: Normal respiratory effort without tachypnea or retractions. Lungs CTAB. Good air entry to the bases with no decreased or absent breath sounds. Musculoskeletal: Full range of motion to all extremities. No gross deformities appreciated. Neurologic:  Normal speech and language. No gross focal neurologic deficits are appreciated.  Skin:  Skin is warm, dry and intact. No rash noted.  Visualization of the right axilla reveals a finding consistent with small portion of tick remaining in embedded in the patient's skin.  There is no surrounding erythema or edema.  No targetoid lesions.  No scattered rash to the remainder of skin exam. Psychiatric: Mood and affect are normal. Speech and behavior are normal. Patient exhibits appropriate insight and judgement.   ____________________________________________   LABS (all labs ordered are listed, but only abnormal results are displayed)  Labs Reviewed - No data to display ____________________________________________  EKG   ____________________________________________  RADIOLOGY   No results found.  ____________________________________________    PROCEDURES  Procedure(s) performed:    .Foreign Body Removal  Date/Time: 08/27/2020 6:13 PM Performed by: 10/27/2020, PA-C Authorized by: Racheal Patches, PA-C  Consent: Verbal consent obtained. Risks and benefits: risks, benefits and alternatives were discussed Consent given by: patient Patient understanding: patient states understanding of the procedure being performed Patient identity confirmed: verbally with patient Body area: skin General location: trunk Location details: right axilla  Sedation: Patient sedated: no  Patient restrained: no Patient cooperative:  yes Removal mechanism: forceps Tendon involvement: none Depth: dermal. Complexity: simple 1 objects recovered. Objects recovered: head of tick Post-procedure assessment: foreign body removed Patient tolerance: patient tolerated the procedure well with no immediate complications Comments: Using forceps, head of the tick was grasped and successfully removed.      Medications  doxycycline (VIBRA-TABS) tablet 200 mg (has no administration in time range)     ____________________________________________   INITIAL  IMPRESSION / ASSESSMENT AND PLAN / ED COURSE  Pertinent labs & imaging results that were available during my care of the patient were reviewed by me and considered in my medical decision making (see chart for details).  Review of the [place] CSRS was performed in accordance of the [facility] prior to dispensing any controlled drugs.           Patient's diagnosis is consistent with tick bite with embedded tick head to the right axilla.  Patient presented to the emergency department after being unable to remove the complete tick that had bit her in her right axilla.  Head of the tick was identified, successfully removed here in the emergency department.  Unsure the duration but patient states it could have been several days.  As such we will give a prophylaxis dose of doxycycline.  This time soap and water to the area, monitor for any concerning signs and symptoms.  No prescriptions at discharge.  Follow-up primary care as needed..  Patient is given ED precautions to return to the ED for any worsening or new symptoms.     ____________________________________________  FINAL CLINICAL IMPRESSION(S) / ED DIAGNOSES  Final diagnoses:  Tick bite of right shoulder, initial encounter      NEW MEDICATIONS STARTED DURING THIS VISIT:  ED Discharge Orders    None          This chart was dictated using voice recognition software/Dragon. Despite best efforts to proofread, errors  can occur which can change the meaning. Any change was purely unintentional.    Racheal PatchesCuthriell, Nicosha Struve D, PA-C 08/27/20 1818    Delton PrairieSmith, Dylan, MD 08/27/20 [phone]

## 2020-08-27 NOTE — ED Triage Notes (Signed)
Pt states her husband tried to pull a tick off from under her R arm this am but the head is still stuck- pt tried to go to her PCP who told her to come here

## 2020-09-17 ENCOUNTER — Telehealth: Payer: Self-pay

## 2020-09-17 NOTE — Telephone Encounter (Signed)
VM 106 : Patient called and left a message stating that she was POSITIVE for COVID and wanted to know if she needed to do anything special (treatment, rest, therapy, etc). I tried calling her back, NA, LMAM.

## 2020-10-08 ENCOUNTER — Encounter: Payer: Self-pay | Admitting: Gastroenterology

## 2020-10-21 ENCOUNTER — Other Ambulatory Visit: Payer: Self-pay | Admitting: Cardiology

## 2020-10-21 DIAGNOSIS — Z951 Presence of aortocoronary bypass graft: Secondary | ICD-10-CM

## 2020-10-21 DIAGNOSIS — I2510 Atherosclerotic heart disease of native coronary artery without angina pectoris: Secondary | ICD-10-CM

## 2020-10-21 DIAGNOSIS — I6523 Occlusion and stenosis of bilateral carotid arteries: Secondary | ICD-10-CM

## 2020-10-21 DIAGNOSIS — E782 Mixed hyperlipidemia: Secondary | ICD-10-CM

## 2020-11-18 ENCOUNTER — Other Ambulatory Visit: Payer: Self-pay | Admitting: Radiology

## 2020-11-18 ENCOUNTER — Other Ambulatory Visit: Payer: Self-pay | Admitting: Family Medicine

## 2020-11-18 DIAGNOSIS — N6452 Nipple discharge: Secondary | ICD-10-CM

## 2020-11-20 ENCOUNTER — Ambulatory Visit: Payer: Medicare HMO | Admitting: Gastroenterology

## 2020-12-07 ENCOUNTER — Other Ambulatory Visit: Payer: Self-pay

## 2020-12-07 ENCOUNTER — Telehealth: Payer: Self-pay | Admitting: Cardiology

## 2020-12-07 DIAGNOSIS — K219 Gastro-esophageal reflux disease without esophagitis: Secondary | ICD-10-CM

## 2020-12-07 MED ORDER — PANTOPRAZOLE SODIUM 40 MG PO TBEC: 40.0000 mg | DELAYED_RELEASE_TABLET | Freq: Every day | ORAL | 1 refills | Status: DC

## 2020-12-07 NOTE — Telephone Encounter (Signed)
Pt is calling for a refill on medication

## 2020-12-07 NOTE — Telephone Encounter (Signed)
Lvm for pt to call back. 

## 2020-12-17 ENCOUNTER — Other Ambulatory Visit: Payer: Self-pay

## 2020-12-17 ENCOUNTER — Ambulatory Visit
Admission: RE | Admit: 2020-12-17 | Discharge: 2020-12-17 | Disposition: A | Discharge status: Home / Self Care | Payer: Medicare HMO | Source: Ambulatory Visit | Attending: Radiology | Admitting: Radiology

## 2020-12-17 DIAGNOSIS — N6452 Nipple discharge: Secondary | ICD-10-CM

## 2020-12-17 MED ORDER — GADOBUTROL 1 MMOL/ML IV SOLN
6.0000 mL | Freq: Once | INTRAVENOUS | Status: AC | PRN
  Administered 2020-12-17: 6 mL via INTRAVENOUS

## 2021-01-06 ENCOUNTER — Emergency Department (HOSPITAL_COMMUNITY): Payer: Medicare HMO

## 2021-01-06 ENCOUNTER — Encounter (HOSPITAL_COMMUNITY): Payer: Self-pay | Admitting: Emergency Medicine

## 2021-01-06 ENCOUNTER — Other Ambulatory Visit: Payer: Self-pay

## 2021-01-06 ENCOUNTER — Emergency Department (HOSPITAL_COMMUNITY)
Admission: EM | Admit: 2021-01-06 | Discharge: 2021-01-07 | Disposition: A | Discharge status: Home / Self Care | Payer: Medicare HMO | Attending: Emergency Medicine | Admitting: Emergency Medicine

## 2021-01-06 DIAGNOSIS — R0602 Shortness of breath: Secondary | ICD-10-CM | POA: Diagnosis not present

## 2021-01-06 DIAGNOSIS — F17210 Nicotine dependence, cigarettes, uncomplicated: Secondary | ICD-10-CM | POA: Insufficient documentation

## 2021-01-06 DIAGNOSIS — I2510 Atherosclerotic heart disease of native coronary artery without angina pectoris: Secondary | ICD-10-CM | POA: Insufficient documentation

## 2021-01-06 DIAGNOSIS — R110 Nausea: Secondary | ICD-10-CM | POA: Diagnosis not present

## 2021-01-06 DIAGNOSIS — I10 Essential (primary) hypertension: Secondary | ICD-10-CM | POA: Diagnosis not present

## 2021-01-06 DIAGNOSIS — Z951 Presence of aortocoronary bypass graft: Secondary | ICD-10-CM | POA: Diagnosis not present

## 2021-01-06 DIAGNOSIS — R079 Chest pain, unspecified: Secondary | ICD-10-CM | POA: Insufficient documentation

## 2021-01-06 LAB — CBC WITH DIFFERENTIAL/PLATELET
Abs Immature Granulocytes: 0.02 10*3/uL (ref 0.00–0.07)
Basophils Absolute: 0.1 10*3/uL (ref 0.0–0.1)
Basophils Relative: 1 %
Eosinophils Absolute: 0.2 10*3/uL (ref 0.0–0.5)
Eosinophils Relative: 3 %
HCT: 41.2 % (ref 36.0–46.0)
Hemoglobin: 13.6 g/dL (ref 12.0–15.0)
Immature Granulocytes: 0 %
Lymphocytes Relative: 36 %
Lymphs Abs: 2.8 10*3/uL (ref 0.7–4.0)
MCH: 30.1 pg (ref 26.0–34.0)
MCHC: 33 g/dL (ref 30.0–36.0)
MCV: 91.2 fL (ref 80.0–100.0)
Monocytes Absolute: 0.6 10*3/uL (ref 0.1–1.0)
Monocytes Relative: 8 %
Neutro Abs: 4.1 10*3/uL (ref 1.7–7.7)
Neutrophils Relative %: 52 %
Platelets: 175 10*3/uL (ref 150–400)
RBC: 4.52 MIL/uL (ref 3.87–5.11)
RDW: 12.9 % (ref 11.5–15.5)
WBC: 7.8 10*3/uL (ref 4.0–10.5)
nRBC: 0 % (ref 0.0–0.2)

## 2021-01-06 LAB — TROPONIN I (HIGH SENSITIVITY)
Troponin I (High Sensitivity): 3 ng/L (ref <18)
Troponin I (High Sensitivity): 3 ng/L (ref <18)

## 2021-01-06 LAB — BASIC METABOLIC PANEL
Anion gap: 10 (ref 5–15)
BUN: 13 mg/dL (ref 6–20)
CO2: 21 mmol/L — ABNORMAL LOW (ref 22–32)
Calcium: 8.9 mg/dL (ref 8.9–10.3)
Chloride: 109 mmol/L (ref 98–111)
Creatinine, Ser: 0.97 mg/dL (ref 0.44–1.00)
GFR, Estimated: >60 mL/min (ref >60)
Glucose, Bld: 93 mg/dL (ref 70–99)
Potassium: 3.8 mmol/L (ref 3.5–5.1)
Sodium: 140 mmol/L (ref 135–145)

## 2021-01-06 LAB — LIPASE, BLOOD: Lipase: 23 U/L (ref 11–51)

## 2021-01-06 LAB — D-DIMER, QUANTITATIVE: D-Dimer, Quant: 0.61 ug/mL-FEU — ABNORMAL HIGH (ref 0.00–0.50)

## 2021-01-06 MED ORDER — IOHEXOL 350 MG/ML SOLN
80.0000 mL | Freq: Once | INTRAVENOUS | Status: AC | PRN
  Administered 2021-01-06: 80 mL via INTRAVENOUS

## 2021-01-06 MED ORDER — ONDANSETRON HCL 4 MG/2ML IJ SOLN
4.0000 mg | Freq: Once | INTRAMUSCULAR | Status: AC
  Administered 2021-01-06: 4 mg via INTRAVENOUS
  Filled 2021-01-06: qty 2

## 2021-01-06 MED ORDER — MORPHINE SULFATE (PF) 4 MG/ML IV SOLN
4.0000 mg | Freq: Once | INTRAVENOUS | Status: AC
  Administered 2021-01-06: 4 mg via INTRAVENOUS
  Filled 2021-01-06: qty 1

## 2021-01-06 NOTE — ED Notes (Signed)
Pt currently at XRAY

## 2021-01-06 NOTE — ED Provider Notes (Signed)
Emergency Department Provider Note   I have reviewed the triage vital signs and the nursing notes.   HISTORY  Chief Complaint Chest Pain   HPI Regina Perry is a 60 y.o. female with past medical history reviewed below in occluding prior CAD and CABG presents to the ED with sharp, left anterior CP.  Pain began today.  She describes some radiation to the left arm which was also sharp in nature.  When pain initially started she had some associated nausea and felt some slight shortness of breath.  She took 3 nitroglycerin with mild improvement but continues to have focal, sharp, left inferior chest pain.  She notes that the pain is worse with deep breathing.  She states with her prior heart related pain she thought that she was having gas and bloating.  She then developed some jaw discomfort which was the indication is something could be going on with her heart but symptoms did begin an atypical fashion at that time.  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   Unstable angina (HCC) 01/08/2019   Lumbar stenosis with neurogenic claudication 02/06/2017   Bilateral sciatica 05/19/2015   Chest pain at rest 04/09/2012   Hypertension 04/09/2012   Hyperlipidemia 04/09/2012   Tobacco abuse 04/09/2012    Past Surgical History:  Procedure Laterality Date   ABDOMINAL HYSTERECTOMY     partial   CESAREAN SECTION     COLONOSCOPY     CORONARY ARTERY BYPASS GRAFT N/A 01/09/2019   Procedure: CORONARY ARTERY BYPASS GRAFTING (CABG) X2 USING LEFT INTERNAL MAMMARY ARTERY TO CIRC AND RIGHT GREATER SAPHENOUS VEIN TO LAD.;  Surgeon: Corliss Skains, MD;  Location: [facility] OR;  Service: Open Heart Surgery;  Laterality: N/A;   CORONARY ARTERY BYPASS GRAFT     ETHMOIDECTOMY Bilateral 06/26/2014   Procedure: BILATERAL ANTERIOR ETHMOIDECTOMY;  Surgeon: Drema Halon, MD;  Location: [facility];  Service: ENT;  Laterality: Bilateral;   LEFT HEART CATH AND CORONARY ANGIOGRAPHY N/A 01/08/2019   Procedure: LEFT HEART CATH AND CORONARY ANGIOGRAPHY;  Surgeon: Elder Negus, MD;  [facility];  Service: Cardiovascular;  Laterality: N/A;   MAXILLARY ANTROSTOMY Bilateral 06/26/2014   Procedure: BILATERAL MAXILLARY OSTEA ENLARGEMENT;  Surgeon: Kristine Garbe  Ezzard Standing, MD;  Location: [facility];  Service: ENT;  Laterality: Bilateral;   NASAL SEPTOPLASTY W/ TURBINOPLASTY Bilateral 06/26/2014   Procedure: BILATERAL NASAL SEPTOPLASTY WITH TURBINATE REDUCTION;  Surgeon: Drema Halon, MD;  Location: [facility];  Service: ENT;  Laterality: Bilateral;   SHOULDER ARTHROSCOPY W/ ROTATOR CUFF REPAIR  6/15   right   TEE WITHOUT CARDIOVERSION  01/09/2019   Procedure: Transesophageal Echocardiogram (Tee);  Surgeon: Corliss Skains, MD;  Location: [facility] OR;  Service: Open Heart Surgery;;    TONSILLECTOMY AND ADENOIDECTOMY     TUBAL LIGATION      Allergies Levofloxacin and Percocet [oxycodone-acetaminophen]  Family History  Problem Relation Age of Onset   Heart disease Mother        MI   Irritable bowel syndrome Mother    Migraines Mother    Aneurysm Mother        brain   Cerebral aneurysm Father    AAA (abdominal aortic aneurysm) Sister    Breast cancer Paternal Grandmother    Lung cancer Paternal Grandfather    Colon cancer Neg Hx    Esophageal cancer Neg Hx    Rectal cancer Neg Hx    Stomach cancer Neg Hx     Social History Social History   Tobacco Use   Smoking status: Some Days    Packs/day: 0.25    Years: 35.00    Pack years: 8.75    Types: Cigarettes    Start date: 12/30/2018    Last attempt to quit: 12/31/2018    Years since quitting: 2.0   Smokeless tobacco: Never  Vaping Use   Vaping Use: Never used  Substance Use Topics   Alcohol use: No   Drug use: No    Review of Systems  Constitutional: No fever/chills Eyes: No visual changes. ENT: No sore throat. Cardiovascular: Positive chest pain. Respiratory: Mild shortness of breath. Gastrointestinal: No abdominal pain. Positive nausea, no vomiting.  No diarrhea.  No constipation. Genitourinary: Negative for dysuria. Musculoskeletal: Negative for back pain. Skin: Negative for rash. Neurological: Negative for headaches, focal weakness or numbness.  10-point ROS otherwise negative.  ____________________________________________   PHYSICAL EXAM:  VITAL SIGNS: ED Triage Vitals  Enc Vitals Group     BP 01/06/21 2000 (!) 145/89     Pulse Rate 01/06/21 2000 63     Resp 01/06/21 2000 18     Temp 01/06/21 2000 98 F (36.7 C)     Temp Source 01/06/21 2000 Oral     SpO2 01/06/21 2000 94 %     Weight 01/06/21 1958 133 lb (60.3 kg)     Height 01/06/21 1958 5\' 3"  (1.6 m)   Constitutional: Alert and oriented. Well appearing and in no acute distress. Eyes: Conjunctivae are normal.  Head:  Atraumatic. Nose: No congestion/rhinnorhea. Mouth/Throat: Mucous membranes are moist.   Neck: No stridor.   Cardiovascular: Normal rate, regular rhythm. Good peripheral circulation. Grossly normal heart sounds.   Respiratory: Normal respiratory effort.  No retractions. Lungs CTAB. Gastrointestinal: Soft and nontender. No distention.  Musculoskeletal: No lower extremity tenderness nor edema. No gross deformities of extremities. Neurologic:  Normal speech and language. No gross focal neurologic deficits are appreciated.  Skin:  Skin is warm, dry and intact. No rash noted.   ____________________________________________   LABS (all labs ordered are listed, but only abnormal results are displayed)  Labs Reviewed  BASIC METABOLIC PANEL - Abnormal; Notable for the following components:  Result Value   CO2 21 (*)    All other components within normal limits  HEPATIC FUNCTION PANEL - Abnormal; Notable for the following components:   Total Protein 6.1 (*)    All other components within normal limits  D-DIMER, QUANTITATIVE - Abnormal; Notable for the following components:   D-Dimer, Quant 0.61 (*)    All other components within normal limits  CBC WITH DIFFERENTIAL/PLATELET  LIPASE, BLOOD  TROPONIN I (HIGH SENSITIVITY)  TROPONIN I (HIGH SENSITIVITY)   ____________________________________________  EKG   EKG Interpretation  Date/Time:  Wednesday January 06 2021 20:07:19 EDT Ventricular Rate:  67 PR Interval:  152 QRS Duration: 88 QT Interval:  418 QTC Calculation: 441 R Axis:   27 Text Interpretation: Normal sinus rhythm Possible Anterior infarct , age undetermined Abnormal ECG No significant change since last tracing Confirmed by Alona Bene [phone]) on 01/06/2021 8:53:42 PM        ____________________________________________  RADIOLOGY  DG Chest 2 View  Result Date: 01/06/2021 CLINICAL DATA:  Chest pain, dyspnea EXAM: CHEST - 2 VIEW COMPARISON:  11/13/2019 FINDINGS:  Lungs are well expanded, symmetric, and clear. No pneumothorax or pleural effusion. Cardiac size within normal limits. Coronary artery bypass grafting has been performed. Pulmonary vascularity is normal. Osseous structures are age-appropriate. No acute bone abnormality. IMPRESSION: No active cardiopulmonary disease. Electronically Signed   By: Helyn Numbers M.D.   On: 01/06/2021 20:59    ____________________________________________   PROCEDURES  Procedure(s) performed:   Procedures  None  ____________________________________________   INITIAL IMPRESSION / ASSESSMENT AND PLAN / ED COURSE  Pertinent labs & imaging results that were available during my care of the patient were reviewed by me and considered in my medical decision making (see chart for details).   Patient presents emergency department with sharp, left-sided chest pain.  She had some nausea at the time of onset. Atypical pain for ACS but sounds like CAD symptoms last time were also fairly atypical. Labs from [facility] process WNL including initial troponin. Will add a d-dimer and LFTs with lipase.   Labs reviewed. D-dimer slightly elevated. Will send for CTA. Care transferred to Dr. Wilkie Aye.  ____________________________________________  FINAL CLINICAL IMPRESSION(S) / ED DIAGNOSES  Final diagnoses:  Chest pain, unspecified type     MEDICATIONS GIVEN DURING THIS VISIT:  Medications  ondansetron (ZOFRAN) injection 4 mg (4 mg Intravenous Given 01/06/21 2213)  morphine 4 MG/ML injection 4 mg (4 mg Intravenous Given 01/06/21 2213)  iohexol (OMNIPAQUE) 350 MG/ML injection 80 mL (80 mLs Intravenous Contrast Given 01/06/21 2352)     Note:  This document was prepared using Dragon voice recognition software and may include unintentional dictation errors.  Alona Bene, MD, [facility] Emergency Medicine    Jade Burkard, Arlyss Repress, MD 01/13/21 [phone]

## 2021-01-06 NOTE — ED Notes (Signed)
Pt has returned from CT.  

## 2021-01-06 NOTE — ED Provider Notes (Signed)
Emergency Medicine Provider Triage Evaluation Note  Regina Perry , a 60 y.o. female  was evaluated in triage.  Pt complains of chest pain.  Started acutely at 2 PM, took a nitroglycerin and aspirin and that made the pain somewhat improved.  Pain started again at 5:00 and has been constant since then.  Feels like a pressure, sometimes it feels sharp.  Does radiate to the left arm.  History of previous MI, states it feels similar.  There is nausea and shortness of breath, no vomiting..  Review of Systems  Positive: Chest pain, shortness of breath, nausea Negative: Vomiting  Physical Exam  There were no vitals taken for this visit. Gen:   Awake, no distress   Resp:  Normal effort  MSK:   Moves extremities without difficulty  Other:  Radial pulse equal, lungs clear to auscultation bilaterally  Medical Decision Making  Medically screening exam initiated at 7:57 PM.  Appropriate orders placed.  Regina Perry was informed that the remainder of the evaluation will be completed by another provider, this initial triage assessment does not replace that evaluation, and the importance of remaining in the ED until their evaluation is complete.  Chest pain work-up   Regina Perry, Regina Perry 01/06/21 1957    Regina Loll, MD 01/06/21 2351

## 2021-01-06 NOTE — ED Notes (Signed)
Pt ambulatory to and from restroom with independent steady gait. 

## 2021-01-06 NOTE — ED Triage Notes (Signed)
Pt reports substernal sharp chest pain that goes to the left side and back.  Pt also c/o SOB, took 3 nitro which only made it "a little better."  Pt was/is nauseated and dizzy.  Extensive cardiac hx.

## 2021-01-06 NOTE — ED Notes (Signed)
Pt has returned from XRAY

## 2021-01-06 NOTE — ED Notes (Signed)
Patient transported to CT 

## 2021-01-07 LAB — HEPATIC FUNCTION PANEL
ALT: 18 U/L (ref 0–44)
AST: 19 U/L (ref 15–41)
Albumin: 3.5 g/dL (ref 3.5–5.0)
Alkaline Phosphatase: 56 U/L (ref 38–126)
Bilirubin, Direct: <0.1 mg/dL (ref 0.0–0.2)
Total Bilirubin: 0.5 mg/dL (ref 0.3–1.2)
Total Protein: 6.1 g/dL — ABNORMAL LOW (ref 6.5–8.1)

## 2021-01-07 NOTE — ED Provider Notes (Signed)
Patient signed out to me by previous provider. Please refer to their note for full HPI.  Briefly this is a 60 year old female who presented with chest pain.  Her cardiac work-up is reassuring and normal but the D-dimer was slightly elevated.  She is pending CT PE study.  Physical Exam  BP 125/65 (BP Location: Left Arm)   Pulse 63   Temp 98 F (36.7 C) (Oral)   Resp 15   Ht 5\' 3"  (1.6 m)   Wt 60.3 kg   SpO2 100%   BMI 23.56 kg/m   Physical Exam Vitals and nursing note reviewed.  Constitutional:      Appearance: Normal appearance.  HENT:     Head: Normocephalic.     Mouth/Throat:     Mouth: Mucous membranes are moist.  Cardiovascular:     Rate and Rhythm: Normal rate.  Pulmonary:     Effort: Pulmonary effort is normal. No respiratory distress.  Abdominal:     Palpations: Abdomen is soft.     Tenderness: There is no abdominal tenderness.  Skin:    General: Skin is warm.  Neurological:     Mental Status: She is alert and oriented to person, place, and time. Mental status is at baseline.  Psychiatric:        Mood and Affect: Mood normal.    ED Course/Procedures     Procedures  MDM   CT PE study shows no PE but identifies several areas of irregular contour along the aortic arch concerning for ulcers without any other focal dissection finding.  Consulted on-call vascular surgeon Dr. .  He has reviewed the imaging.  States that this is a finding that can be followed up in the outpatient setting.  Patient's chest pain has been resolved on my evaluations.  She offers no new complaints.  Work-up is otherwise reassuring and she has a follow-up appoint with cardiology next week.  Patient at this time appears safe and stable for discharge and will be treated as an outpatient.  Discharge plan and strict return to ED precautions discussed, patient verbalizes understanding and agreement.       Myra Gianotti, DO 01/07/21 01/09/21

## 2021-01-07 NOTE — Discharge Instructions (Addendum)
You have been seen and discharged from the emergency department.  Your cardiac work-up was normal.  The CT of your chest did show some abnormal atherosclerotic plaques in the aortic arch, follow-up with vascular surgery for this.  Follow-up with your primary provider for reevaluation and further care. Take home medications as prescribed. If you have any worsening symptoms or further concerns for your health please return to an emergency department for further evaluation.

## 2021-01-11 ENCOUNTER — Other Ambulatory Visit: Payer: Medicare HMO

## 2021-01-12 ENCOUNTER — Other Ambulatory Visit: Payer: Self-pay

## 2021-01-12 ENCOUNTER — Ambulatory Visit: Payer: Medicare HMO

## 2021-01-12 DIAGNOSIS — I6523 Occlusion and stenosis of bilateral carotid arteries: Secondary | ICD-10-CM

## 2021-01-12 DIAGNOSIS — I714 Abdominal aortic aneurysm, without rupture, unspecified: Secondary | ICD-10-CM

## 2021-01-24 ENCOUNTER — Other Ambulatory Visit: Payer: Self-pay | Admitting: Cardiology

## 2021-01-24 DIAGNOSIS — I6523 Occlusion and stenosis of bilateral carotid arteries: Secondary | ICD-10-CM

## 2021-01-24 DIAGNOSIS — I714 Abdominal aortic aneurysm, without rupture, unspecified: Secondary | ICD-10-CM

## 2021-01-25 NOTE — Progress Notes (Signed)
Called pt, no answer. Left vm requesting call back?

## 2021-01-26 NOTE — Progress Notes (Signed)
Called and spoke to pt, pt voiced understanding.

## 2021-02-25 ENCOUNTER — Encounter: Payer: Self-pay | Admitting: Emergency Medicine

## 2021-02-25 ENCOUNTER — Ambulatory Visit
Admission: EM | Admit: 2021-02-25 | Discharge: 2021-02-25 | Disposition: A | Discharge status: Home / Self Care | Payer: Medicare HMO | Attending: Physician Assistant | Admitting: Physician Assistant

## 2021-02-25 ENCOUNTER — Other Ambulatory Visit: Payer: Self-pay

## 2021-02-25 DIAGNOSIS — R52 Pain, unspecified: Secondary | ICD-10-CM | POA: Diagnosis not present

## 2021-02-25 DIAGNOSIS — J069 Acute upper respiratory infection, unspecified: Secondary | ICD-10-CM

## 2021-02-25 NOTE — ED Provider Notes (Signed)
[facility]    CSN: [id number] Arrival date & time: 02/25/21  1204      History   Chief Complaint Chief Complaint  Patient presents with   Cough   Sore Throat    HPI Regina Perry is a 60 y.o. female.   Patient here today for evaluation of runny nose, cough, fatigue and fever that started 4 days ago.  She has not had any vomiting or diarrhea.  She states cough is productive at times.  She has tried over-the-counter medication without significant relief.  The history is provided by the patient.  Cough Associated symptoms: fever and sore throat   Associated symptoms: no ear pain, no eye discharge and no shortness of breath   Sore Throat Pertinent negatives include no abdominal pain and no shortness of breath.  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   Unstable angina ([place]) 01/08/2019   Lumbar stenosis with neurogenic claudication 02/06/2017   Bilateral sciatica 05/19/2015   Chest pain at rest  04/09/2012   Hypertension 04/09/2012   Hyperlipidemia 04/09/2012   Tobacco abuse 04/09/2012    Past Surgical History:  Procedure Laterality Date   ABDOMINAL HYSTERECTOMY     partial   CESAREAN SECTION     COLONOSCOPY     CORONARY ARTERY BYPASS GRAFT N/A 01/09/2019   Procedure: CORONARY ARTERY BYPASS GRAFTING (CABG) X2 USING LEFT INTERNAL MAMMARY ARTERY TO CIRC AND RIGHT GREATER SAPHENOUS VEIN TO LAD.;  Surgeon: Lajuana Matte, MD;  Location: [facility];  Service: Open Heart Surgery;  Laterality: N/A;   CORONARY ARTERY BYPASS GRAFT     ETHMOIDECTOMY Bilateral 06/26/2014   Procedure: BILATERAL ANTERIOR ETHMOIDECTOMY;  Surgeon: Rozetta Nunnery, MD;  Location: [place];  Service: ENT;  Laterality: Bilateral;   LEFT HEART CATH AND CORONARY ANGIOGRAPHY N/A 01/08/2019   Procedure: LEFT HEART CATH AND CORONARY ANGIOGRAPHY;  Surgeon: Nigel Mormon, MD;  Location: [facility];  Service: Cardiovascular;  Laterality: N/A;   MAXILLARY ANTROSTOMY Bilateral 06/26/2014   Procedure: BILATERAL MAXILLARY OSTEA ENLARGEMENT;  Surgeon: Rozetta Nunnery, MD;  Location: [facility];  Service: ENT;  Laterality: Bilateral;   NASAL SEPTOPLASTY W/ TURBINOPLASTY Bilateral 06/26/2014   Procedure: BILATERAL NASAL SEPTOPLASTY WITH TURBINATE REDUCTION;  Surgeon: Rozetta Nunnery, MD;  Location: [facility];  Service: ENT;  Laterality: Bilateral;   SHOULDER ARTHROSCOPY W/ ROTATOR CUFF REPAIR  6/15   right   TEE WITHOUT CARDIOVERSION  01/09/2019   Procedure: Transesophageal Echocardiogram ([place]);  Surgeon: Lajuana Matte, MD;  Location: [facility];  Service: Open Heart Surgery;;   TONSILLECTOMY AND ADENOIDECTOMY     TUBAL LIGATION      OB History   No obstetric history on file.      Home Medications    Prior to Admission medications   Medication Sig Start Date End Date Taking? Authorizing Provider  acetaminophen (TYLENOL) 500 MG tablet Take  1,000 mg by mouth every 6 (six) hours as needed for moderate pain or headache.   Yes [provider]  ALPRAZolam Duanne Moron) 0.5 MG tablet Take 0.5 mg by mouth in the morning and at bedtime. 08/19/19  Yes [provider]  amoxicillin-clavulanate (AUGMENTIN) 875-125 MG tablet Take 1 tablet by mouth See admin instructions. Every 12 hours x 10 days 01/05/21  Yes [provider]  aspirin EC 81 MG tablet Take 1 tablet (81 mg total) by mouth daily. 02/19/19  Yes Miquel Dunn, NP  cetirizine (ZYRTEC) 10 MG tablet Take 10 mg by mouth at bedtime.   Yes [provider]  Chlorpheniramine-DM (CORICIDIN HBP COUGH/COLD PO) Take 1 capsule by mouth every 6 (six) hours as needed (cold symptoms).   Yes [provider]  clopidogrel (PLAVIX) 75 MG tablet Take 75 mg by mouth daily.   Yes [provider]  escitalopram (LEXAPRO) 20 MG tablet Take 20 mg by mouth at bedtime. 08/20/19  Yes [provider]  gabapentin (NEURONTIN) 300 MG capsule Take 1 capsule (300 mg total) by mouth 3 (three) times daily. 09/17/15  Yes Kathrynn Ducking, MD  HYDROcodone-acetaminophen [facility]) 10-325 MG tablet Take 1 tablet by mouth every 6 (six) hours as needed for moderate pain.   Yes [provider]  losartan (COZAAR) 25 MG tablet Take 25 mg by mouth every evening.   Yes [provider]  metoprolol succinate (TOPROL-XL) 25 MG 24 hr tablet TAKE 1 TAB IN THE MORNING,HOLD IF SYSTOLIC BLOOD PRESSURE(TOP NUMBER) LESS THAN 100MMHG OR HEART RATE LESS THAN 60BPM (PULSE Patient taking differently: Take 25 mg by mouth daily. 10/22/20  Yes Tolia, Sunit, DO  nitroGLYCERIN (NITROSTAT) 0.4 MG SL tablet Place 0.4 mg under the tongue every 5 (five) minutes as needed for chest pain.   Yes [provider]  ofloxacin (FLOXIN) 0.3 % OTIC solution Place 3 drops into the left ear See admin instructions. Qid x 10 days 01/05/21  Yes [provider]  pantoprazole  (PROTONIX) 40 MG tablet Take 1 tablet (40 mg total) by mouth daily. Patient taking differently: Take 40 mg by mouth at bedtime. 12/07/20  Yes Tolia, Sunit, DO  promethazine (PHENERGAN) 25 MG tablet Take 25 mg every 6 (six) hours as needed by mouth for nausea or vomiting.   Yes [provider]  SUMAtriptan (IMITREX) 100 MG tablet Take 100 mg by mouth every 2 (two) hours as needed for migraine or headache. 12/21/20  Yes [provider]  vitamin B-12 (CYANOCOBALAMIN) 500 MCG tablet Take 500 mcg by mouth daily.   Yes [provider]  rosuvastatin (CRESTOR) 40 MG tablet TAKE 1 TABLET (40 MG TOTAL) BY MOUTH DAILY. 10/22/20 01/20/21  Rex Kras, DO    Family History Family History  Problem Relation Age of Onset   Heart disease Mother        MI  Irritable bowel syndrome Mother    Migraines Mother    Aneurysm Mother        brain   Cerebral aneurysm Father    AAA (abdominal aortic aneurysm) Sister    Breast cancer Paternal Grandmother    Lung cancer Paternal Grandfather    Colon cancer Neg Hx    Esophageal cancer Neg Hx    Rectal cancer Neg Hx    Stomach cancer Neg Hx     Social History Social History   Tobacco Use   Smoking status: Some Days    Packs/day: 0.25    Years: 35.00    Pack years: 8.75    Types: Cigarettes    Start date: 12/30/2018    Last attempt to quit: 12/31/2018    Years since quitting: 2.1   Smokeless tobacco: Never  Vaping Use   Vaping Use: Never used  Substance Use Topics   Alcohol use: No   Drug use: No     Allergies   Levofloxacin and Percocet [oxycodone-acetaminophen]   Review of Systems Review of Systems  Constitutional:  Positive for fatigue and fever.  HENT:  Positive for congestion, sinus pressure and sore throat. Negative for ear pain.   Eyes:  Negative for discharge and redness.  Respiratory:  Positive for cough. Negative for shortness of breath.   Gastrointestinal:  Negative for abdominal pain, diarrhea, nausea and  vomiting.    Physical Exam Triage Vital Signs ED Triage Vitals  Enc Vitals Group     BP 02/25/21 1228 (!) 139/58     Pulse Rate 02/25/21 1228 62     Resp --      Temp 02/25/21 1228 98.2 F (36.8 C)     Temp Source 02/25/21 1228 Oral     SpO2 02/25/21 1228 95 %     Weight 02/25/21 1229 132 lb (59.9 kg)     Height 02/25/21 1229 5' 2.5" (1.588 m)     Head Circumference --      Peak Flow --      Pain Score 02/25/21 1229 0     Pain Loc --      Pain Edu? --      Excl. in GC? --    No data found.  Updated Vital Signs BP (!) 139/58 (BP Location: Left Arm)   Pulse 62   Temp 98.2 F (36.8 C) (Oral)   Ht 5' 2.5" (1.588 m)   Wt 132 lb (59.9 kg)   SpO2 95%   BMI 23.76 kg/m      Physical Exam Vitals and nursing note reviewed.  Constitutional:      General: She is not in acute distress.    Appearance: Normal appearance. She is not ill-appearing.  HENT:     Head: Normocephalic and atraumatic.     Nose: Congestion present.     Mouth/Throat:     Mouth: Mucous membranes are moist.     Pharynx: No oropharyngeal exudate or posterior oropharyngeal erythema.  Eyes:     Conjunctiva/sclera: Conjunctivae normal.  Cardiovascular:     Rate and Rhythm: Normal rate and regular rhythm.     Heart sounds: Normal heart sounds. No murmur heard. Pulmonary:     Effort: Pulmonary effort is normal. No respiratory distress.     Breath sounds: Normal breath sounds. No wheezing, rhonchi or rales.  Skin:    General: Skin is warm and dry.  Neurological:     Mental Status: She is alert.  Psychiatric:  Mood and Affect: Mood normal.        Thought Content: Thought content normal.     UC Treatments / Results  Labs (all labs ordered are listed, but only abnormal results are displayed) Labs Reviewed  COVID-19, FLU A+B NAA    EKG   Radiology No results found.  Procedures Procedures (including critical care time)  Medications Ordered in UC Medications - No data to  display  Initial Impression / Assessment and Plan / UC Course  I have reviewed the triage vital signs and the nursing notes.  Pertinent labs & imaging results that were available during my care of the patient were reviewed by me and considered in my medical decision making (see chart for details).  Screening ordered for COVID and flu.  Suspect likely viral etiology of symptoms and recommended continued symptomatic treatment.  Encouraged follow-up with any further concerns.   Final Clinical Impressions(s) / UC Diagnoses   Final diagnoses:  Generalized body aches  Acute upper respiratory infection   Discharge Instructions   None    ED Prescriptions   None    PDMP not reviewed this encounter.   Francene Finders, PA-C 02/25/21 1317

## 2021-02-25 NOTE — ED Triage Notes (Signed)
Patient c/o productive cough, fever, fatigue, runny nose x 4 days.  Patient has taken OTC cold meds.  Patient is vaccinated for COVID.

## 2021-02-26 LAB — COVID-19, FLU A+B NAA
Influenza A, NAA: DETECTED — AB
Influenza B, NAA: NOT DETECTED
SARS-CoV-2, NAA: NOT DETECTED

## 2021-03-08 ENCOUNTER — Ambulatory Visit: Payer: Medicare HMO | Admitting: Cardiology

## 2021-03-08 ENCOUNTER — Other Ambulatory Visit: Payer: Self-pay

## 2021-03-08 ENCOUNTER — Encounter: Payer: Self-pay | Admitting: Cardiology

## 2021-03-08 VITALS — BP 116/67 | HR 60 | Resp 16 | Ht 62.0 in | Wt 131.0 lb

## 2021-03-08 DIAGNOSIS — I10 Essential (primary) hypertension: Secondary | ICD-10-CM

## 2021-03-08 DIAGNOSIS — F17200 Nicotine dependence, unspecified, uncomplicated: Secondary | ICD-10-CM

## 2021-03-08 DIAGNOSIS — Z951 Presence of aortocoronary bypass graft: Secondary | ICD-10-CM

## 2021-03-08 DIAGNOSIS — E782 Mixed hyperlipidemia: Secondary | ICD-10-CM

## 2021-03-08 DIAGNOSIS — I6523 Occlusion and stenosis of bilateral carotid arteries: Secondary | ICD-10-CM

## 2021-03-08 DIAGNOSIS — I714 Abdominal aortic aneurysm, without rupture, unspecified: Secondary | ICD-10-CM

## 2021-03-08 DIAGNOSIS — I2510 Atherosclerotic heart disease of native coronary artery without angina pectoris: Secondary | ICD-10-CM

## 2021-03-08 NOTE — Progress Notes (Signed)
 Regina Perry Date of Birth: 11/18/1960 MRN: [id number] Primary Care Provider:Pa, [facility] Former Cardiology Providers: Regina Kelley, APRN, FNP-C Primary Cardiologist:  , DO, FACC (established care 04/22/2019) Primary cardiothoracic surgery: Dr. Lightfoot  Date: 03/08/21 Last Office Visit: 07/20/2020  Chief Complaint  Patient presents with   Coronary Artery Disease   carotid artery stenosis   Follow-up    HPI  Regina Perry is a 60 y.o.  female who presents to the office with a chief complaint of " 8-month follow-up for CAD and carotid disease management." Patient's past medical history and cardiovascular risk factors include: Hypertension, hyperlipidemia, tobacco use disorder, asymptomatic nonsustained ventricular tachycardia noted on event monitor in 2017, atherosclerosis of the native coronary arteries status post two-vessel CABG 12/2018, carotid artery atherosclerosis.   In the past patient was noted to have left main disease underwent coronary CTA and subsequently underwent left heart catheterization which verified the findings and underwent two-vessel CABG in October 2020.  Since then she has been managed medically and has done relatively well.  Patient is also noted to have abdominal aortic aneurysm which is being managed medically and carotid artery atherosclerosis right greater than left.    Patient continues to smoke cigarettes but states that she is only had 3 cigarettes over the last 3 weeks.  She recently had influenza A infection and is recovering well.  No use of sublingual nitroglycerin tablet since last office encounter.  FUNCTIONAL STATUS: She walks 7 acres daily.  ALLERGIES: Allergies  Allergen Reactions   Levofloxacin Nausea Only   Percocet [Oxycodone-Acetaminophen] Other (See Comments)    Sick on stomach when taken with anesthesia    MEDICATION LIST PRIOR TO VISIT: Current Outpatient Medications on File Prior to Visit  Medication  Sig Dispense Refill   acetaminophen (TYLENOL) 500 MG tablet Take 1,000 mg by mouth every 6 (six) hours as needed for moderate pain or headache.     ALPRAZolam (XANAX) 0.5 MG tablet Take 0.5 mg by mouth in the morning and at bedtime.     aspirin EC 81 MG tablet Take 1 tablet (81 mg total) by mouth daily. 90 tablet 3   cetirizine (ZYRTEC) 10 MG tablet Take 10 mg by mouth at bedtime.     clopidogrel (PLAVIX) 75 MG tablet Take 75 mg by mouth daily.     escitalopram (LEXAPRO) 20 MG tablet Take 20 mg by mouth at bedtime.     gabapentin (NEURONTIN) 300 MG capsule Take 1 capsule (300 mg total) by mouth 3 (three) times daily. 90 capsule 4   HYDROcodone-acetaminophen (NORCO) 10-325 MG tablet Take 1 tablet by mouth every 6 (six) hours as needed for moderate pain.     losartan (COZAAR) 25 MG tablet Take 25 mg by mouth every evening.     metoprolol succinate (TOPROL-XL) 25 MG 24 hr tablet TAKE 1 TAB IN THE MORNING,HOLD IF SYSTOLIC BLOOD PRESSURE(TOP NUMBER) LESS THAN 100MMHG OR HEART RATE LESS THAN 60BPM (PULSE (Patient taking differently: Take 25 mg by mouth daily.) 90 tablet 0   nitroGLYCERIN (NITROSTAT) 0.4 MG SL tablet Place 0.4 mg under the tongue every 5 (five) minutes as needed for chest pain.     pantoprazole (PROTONIX) 40 MG tablet Take 1 tablet (40 mg total) by mouth daily. (Patient taking differently: Take 40 mg by mouth at bedtime.) 90 tablet 1   promethazine (PHENERGAN) 25 MG tablet Take 25 mg every 6 (six) hours as needed by mouth for nausea or vomiting.       rosuvastatin (CRESTOR) 40 MG tablet TAKE 1 TABLET (40 MG TOTAL) BY MOUTH DAILY. 90 tablet 3   SUMAtriptan (IMITREX) 100 MG tablet Take 100 mg by mouth every 2 (two) hours as needed for migraine or headache.     vitamin B-12 (CYANOCOBALAMIN) 500 MCG tablet Take 500 mcg by mouth daily.     No current facility-administered medications on file prior to visit.    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 Jay Perry), prescribed verapamil   Pleurisy    Pneumonia    PONV (postoperative nausea and vomiting)    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   CORONARY ARTER(CABG) X2  LIMA to LAD and SVG TO OM1 01/09/19    Vitamin D deficiency     PAST SURGICAL HISTORY: Past Surgical History:  Procedure Laterality Date   ABDOMINAL HYSTERECTOMY     partial   CESAREAN SECTION     COLONOSCOPY     CORONARY ARTERY BYPASS GRAFT N/A 01/09/2019   Procedure: CORONARY ARTERY BYPASS GRAFTING (CABG) X2 USING LEFT INTERNAL MAMMARY ARTERY TO CIRC AND RIGHT GREATER SAPHENOUS VEIN TO LAD.;  Surgeon: Perry, Regina O, MD;  Location: [facility] OR;  Service: Open Heart Surgery;  Laterality: N/A;   CORONARY ARTERY BYPASS GRAFT     ETHMOIDECTOMY Bilateral 06/26/2014   Procedure: BILATERAL ANTERIOR ETHMOIDECTOMY;  Surgeon: Regina E Newman, MD;  Location: [facility];  Service: ENT;  Laterality: Bilateral;   LEFT HEART CATH AND CORONARY ANGIOGRAPHY N/A 01/08/2019   Procedure: LEFT HEART CATH AND CORONARY ANGIOGRAPHY;  Surgeon: Perry, Regina J, MD;  Location: [facility];  Service: Cardiovascular;  Laterality: N/A;   MAXILLARY ANTROSTOMY Bilateral 06/26/2014   Procedure: BILATERAL MAXILLARY OSTEA ENLARGEMENT;  Surgeon: Regina E Newman, MD;  Location: [facility];  Service: ENT;  Laterality: Bilateral;   NASAL SEPTOPLASTY W/ TURBINOPLASTY Bilateral 06/26/2014   Procedure: BILATERAL NASAL SEPTOPLASTY WITH TURBINATE REDUCTION;  Surgeon:  Regina E Newman, MD;  Location: [facility];  Service: ENT;  Laterality: Bilateral;   SHOULDER ARTHROSCOPY W/ ROTATOR CUFF REPAIR  6/15   right   TEE WITHOUT CARDIOVERSION  01/09/2019   Procedure: Transesophageal Echocardiogram (Tee);  Surgeon: Perry, Regina O, MD;  Location: [facility] OR;  Service: Open Heart Surgery;;   TONSILLECTOMY AND ADENOIDECTOMY     TUBAL LIGATION      FAMILY HISTORY: The patient's family history includes AAA (abdominal aortic aneurysm) in her sister; Aneurysm in her mother; Breast cancer in her paternal grandmother; Cerebral aneurysm in her father; Heart disease in her mother; Irritable bowel syndrome in her mother; Lung cancer in her paternal grandfather; Migraines in her mother.   SOCIAL HISTORY:  The patient  reports that she has been smoking cigarettes. She started smoking about 2 years ago. She has a 8.75 pack-year smoking history. She has never used smokeless tobacco. She reports that   she does not drink alcohol and does not use drugs.  Review of Systems  Constitutional: Negative for chills and fever.  HENT:  Negative for hoarse voice and nosebleeds.   Eyes:  Negative for discharge, double vision and pain.  Cardiovascular:  Negative for chest pain, claudication, dyspnea on exertion, leg swelling, near-syncope, orthopnea, palpitations, paroxysmal nocturnal dyspnea and syncope.  Respiratory:  Negative for hemoptysis and shortness of breath.   Musculoskeletal:  Negative for muscle cramps and myalgias.  Gastrointestinal:  Negative for abdominal pain, constipation, diarrhea, hematemesis, hematochezia, melena, nausea and vomiting.  Genitourinary:  Negative for hematuria.  Neurological:  Negative for light-headedness.   PHYSICAL EXAM: Vitals with BMI 03/08/2021 02/25/2021 01/07/2021  Height 5' 2" 5' 2.5" -  Weight 131 lbs 132 lbs -  BMI 23.95 23.74 -  Systolic 116 139 125  Diastolic 67 58 65  Pulse 60 62 63   CONSTITUTIONAL: Well-developed  and well-nourished. No acute distress.  SKIN: Skin is warm and dry. No rash noted. No cyanosis. No pallor. No jaundice HEAD: Normocephalic and atraumatic.  EYES: No scleral icterus MOUTH/THROAT: Moist oral membranes.  NECK: No JVD present. No thyromegaly noted. No carotid bruits  LYMPHATIC: No visible cervical adenopathy.  CHEST Normal respiratory effort. No intercostal retractions  LUNGS: Clear to auscultation bilaterally.  No stridor. No wheezes. No rales.  CARDIOVASCULAR: Regular rhythm, normal S1-S2, no murmurs rubs or gallops appreciated.  ABDOMINAL: Nonobese, soft, nontender, nondistended, positive bowel sounds in all 4 quadrants, no abdominal bruits, no apparent ascites.  EXTREMITIES: No peripheral edema  HEMATOLOGIC: No significant bruising NEUROLOGIC: Oriented to person, place, and time. Nonfocal. Normal muscle tone.  PSYCHIATRIC: Normal mood and affect. Normal behavior. Cooperative  RADIOLOGY: Coronary CTA 12/25/18:  1. The left main is short and FFRct cannot be modeled in this region. 2. FFRct in the proximal LAD is mildly abnormal. However modeling demonstrates that opening the left main stenosis results in a significant improvement in flow in the proximal LAD. 3. FFRct demonstrates significant stenoses in the proximal and mid LAD. Based on modeling, opening the LM and proximal stenosis does not normalize flow more distally. 4. Recommend cardiac catheterization. Take note of concern for ostial left main disease.  CARDIAC DATABASE: Coronary artery bypass grafting: Year: 01/09/2019 Surgical anatomy: 2 vessel CABG: LIMA to LCx and SVG to LAD  EKG: 03/08/2021: Sinus bradycardia, 57 bpm, without underlying injury pattern.  Echocardiogram: 01/09/2019: LVEF 60-65%, mild LVH, trace MR, trace TR, mild plaque involving ascending aorta.  Stress Testing:  PCV MYOCARDIAL PERFUSION WITH LEXISCAN 11/25/2019 Lexiscan (Walking with mod Bruce)Tetrofosmin Stress Test   11/25/2019: Walking Lexiscan protocol. Equivocal ECG stress. Resting EKG/ECG demonstrated normal sinus rhythm. Peak EKG revealed no ST-T wave abnormalities. Recovery EKG revealed 1 mm horizontal ST depression of the inferolateral leads. Normal myocardial perfusion. Gated SPECT imaging of the left ventricle was normal. All segments of left ventricle demonstrated normal wall motion and thickening. TID is normal. Stress LV EF is normal 63%. Compared to exercise nuclear stress in 07/20/2015, exercise EKG without ischemia and normal perfusion. Low risk. Clinical correlation recommended.  Heart Catheterization: Cath 01/08/2019:  LM: Mid calcified 90% stenosis. LAD: Mid LAD focal calfied 60% stenosis after D2 LCx: Normal RCA: Ostial calcification without significant disease   LVEF normal LVEDP mildly elevated at 21 mmHg  Carotid artery duplex 01/12/2021:  Duplex suggests stenosis in the right internal carotid artery (50-69%). Duplex suggests stenosis in the right external carotid artery (<50%). Duplex suggests stenosis in the left internal   carotid artery (16-49%). Antegrade right vertebral artery flow. Antegrade left vertebral artery flow. Compared to the study done on 06/18/2020, there is mild progression of disease on the left. Otherwise no significant change. Follow up in six months is appropriate if clinically indicated.  Abdominal Aortic Duplex 01/12/2021:  Moderate dilatation of the abdominal aorta is noted in the distal aorta. An abdominal aortic aneurysm measuring 3.07 x 3 x 3 cm is seen. There is ectasia noted in the proximal and mid abdominal aorta. The aorta is severely calcified throughout including common iliac arteries. Normal flow velocity in the aorta and bilateral CIA.  Consider reevaluation in 2-3 years for stability.  LABORATORY DATA: CBC Latest Ref Rng & Units 01/06/2021 11/13/2019 01/14/2019  WBC 4.0 - 10.5 K/uL 7.8 7.3 11.5(H)  Hemoglobin 12.0 - 15.0 g/dL 13.6 14.0  9.1(L)  Hematocrit 36.0 - 46.0 % 41.2 43.3 27.0(L)  Platelets 150 - 400 K/uL 175 195 197    CMP Latest Ref Rng & Units 01/06/2021 03/09/2020 11/13/2019  Glucose 70 - 99 mg/dL 93 100(H) 104(H)  BUN 6 - 20 mg/dL 13 14 10  Creatinine 0.44 - 1.00 mg/dL 0.97 0.93 0.85  Sodium 135 - 145 mmol/L 140 145(H) 139  Potassium 3.5 - 5.1 mmol/L 3.8 4.6 4.4  Chloride 98 - 111 mmol/L 109 109(H) 109  CO2 22 - 32 mmol/L 21(L) 24 24  Calcium 8.9 - 10.3 mg/dL 8.9 8.9 8.9  Total Protein 6.5 - 8.1 g/dL 6.1(L) 6.4 -  Total Bilirubin 0.3 - 1.2 mg/dL 0.5 0.5 -  Alkaline Phos 38 - 126 U/L 56 69 -  AST 15 - 41 U/L 19 14 -  ALT 0 - 44 U/L 18 10 -    Lipid Panel  Lab Results  Component Value Date   CHOL 137 03/09/2020   HDL 47 03/09/2020   LDLCALC 68 03/09/2020   LDLDIRECT 61 03/09/2020   TRIG 123 03/09/2020   CHOLHDL 4.7 04/10/2012     Lab Results  Component Value Date   HGBA1C 6.0 (H) 01/08/2019   HGBA1C 5.6 04/10/2012   No components found for: NTPROBNP No results found for: TSH  Cardiac Panel (last 3 results) No results for input(s): CKTOTAL, CKMB, TROPONINIHS, RELINDX in the last 72 hours.  IMPRESSION:    ICD-10-CM   1. Atherosclerosis of native coronary artery of native heart without angina pectoris  I25.10 Lipid Panel With LDL/HDL Ratio    Lipoprotein A (LPA)    LDL cholesterol, direct    CMP14+EGFR    Apo A1 + B + Ratio    2. S/P CABG x 2  Z95.1 Lipid Panel With LDL/HDL Ratio    Lipoprotein A (LPA)    LDL cholesterol, direct    CMP14+EGFR    Apo A1 + B + Ratio    3. Abdominal aortic aneurysm (AAA) without rupture, unspecified part  I71.40     4. Asymptomatic bilateral carotid artery stenosis  I65.23 EKG 12-Lead    Lipid Panel With LDL/HDL Ratio    Lipoprotein A (LPA)    LDL cholesterol, direct    CMP14+EGFR    Apo A1 + B + Ratio    5. Mixed hyperlipidemia  E78.2 Lipid Panel With LDL/HDL Ratio    Lipoprotein A (LPA)    LDL cholesterol, direct    CMP14+EGFR    Apo A1 +  B + Ratio    6. Essential hypertension  I10     7. Tobacco use disorder  F17.200          RECOMMENDATIONS: JODA BRAATZ is a 59 y.o. female whose past medical history and cardiovascular risk factors include: Hypertension, hyperlipidemia, tobacco use disorder, asymptomatic nonsustained ventricular tachycardia noted on event monitor in 2017, atherosclerosis of the native coronary arteries status post two-vessel CABG 12/2018.   Atherosclerosis of the native coronary artery without angina pectoris status post two-vessel CABG: Denies anginal discomfort. Medications reconciled. We emphasized the importance of blood pressure management, weight loss, complete smoking cessation, and lipid management.   She has been evaluated by sleep medicine and does not have sleep apnea. Continue improve modifiable cardiovascular risk factors and adherence to medication compliance. We will check a fasting lipid profile as the last 1 was performed in December 2021.  Two-vessel coronary artery bypass grafting surgery: Remains asymptomatic.  See above.  Educated on importance of aggressive risk factor modifications.  Abdominal aortic aneurysm: Initially discovered on a recent CT performed at an outside facility. Educated on the importance of complete smoking cessation. Blood pressure management. Lipid management-last LDL less than 70 mg/dL Educated on his symptoms of abdominal aortic aneurysm rupture.  Patient is encouraged to go to the hospital if any of the symptoms surface. We will repeat abdominal aortic ultrasound in 2024.  Atherosclerosis of carotid arteries:  Continue aspirin and statin therapy.   Most recent duplex reviewed.  Stenosis in the right ICA is greater than the left ICA.   Educated on importance of complete smoking cessation. Carotid duplex will be scheduled in 6 months. Will check lipid profile prior to the next office visit.  Benign essential hypertension: Office blood pressure is  improving and home BP have been at goal (120-136mHg).  Medication reconciled.  Low salt diet recommended. A diet that is rich in fruits, vegetables, legumes, and low-fat dairy products and low in snacks, sweets, and meats (such as the Dietary Approaches to Stop Hypertension [DASH] diet).   Mixed hyperlipidemia: Continue Crestor, will check fasting lipid profile.  Cigarette smoking:  Tobacco cessation counseling: Currently smoking 3 cigarettes over the last 3 weeks Patient is willing to quit at this time. 5 mins were spent counseling patient cessation techniques. We discussed various methods to help quit smoking, including deciding on a date to quit, joining a support group, pharmacological agents- nicotine gum/patch/lozenges.  I will reassess her progress at the next follow-up visit  FINAL MEDICATION LIST END OF ENCOUNTER: No orders of the defined types were placed in this encounter.   Current Outpatient Medications:    acetaminophen (TYLENOL) 500 MG tablet, Take 1,000 mg by mouth every 6 (six) hours as needed for moderate pain or headache., Disp: , Rfl:    ALPRAZolam (XANAX) 0.5 MG tablet, Take 0.5 mg by mouth in the morning and at bedtime., Disp: , Rfl:    aspirin EC 81 MG tablet, Take 1 tablet (81 mg total) by mouth daily., Disp: 90 tablet, Rfl: 3   cetirizine (ZYRTEC) 10 MG tablet, Take 10 mg by mouth at bedtime., Disp: , Rfl:    clopidogrel (PLAVIX) 75 MG tablet, Take 75 mg by mouth daily., Disp: , Rfl:    escitalopram (LEXAPRO) 20 MG tablet, Take 20 mg by mouth at bedtime., Disp: , Rfl:    gabapentin (NEURONTIN) 300 MG capsule, Take 1 capsule (300 mg total) by mouth 3 (three) times daily., Disp: 90 capsule, Rfl: 4   HYDROcodone-acetaminophen (NORCO) 10-325 MG tablet, Take 1 tablet by mouth every 6 (six) hours as needed for moderate pain., Disp: , Rfl:    losartan (COZAAR) 25 MG tablet,  Take 25 mg by mouth every evening., Disp: , Rfl:    metoprolol succinate (TOPROL-XL) 25 MG 24 hr  tablet, TAKE 1 TAB IN THE MORNING,HOLD IF SYSTOLIC BLOOD PRESSURE(TOP NUMBER) LESS THAN 100MMHG OR HEART RATE LESS THAN 60BPM (PULSE (Patient taking differently: Take 25 mg by mouth daily.), Disp: 90 tablet, Rfl: 0   nitroGLYCERIN (NITROSTAT) 0.4 MG SL tablet, Place 0.4 mg under the tongue every 5 (five) minutes as needed for chest pain., Disp: , Rfl:    pantoprazole (PROTONIX) 40 MG tablet, Take 1 tablet (40 mg total) by mouth daily. (Patient taking differently: Take 40 mg by mouth at bedtime.), Disp: 90 tablet, Rfl: 1   promethazine (PHENERGAN) 25 MG tablet, Take 25 mg every 6 (six) hours as needed by mouth for nausea or vomiting., Disp: , Rfl:    rosuvastatin (CRESTOR) 40 MG tablet, TAKE 1 TABLET (40 MG TOTAL) BY MOUTH DAILY., Disp: 90 tablet, Rfl: 3   SUMAtriptan (IMITREX) 100 MG tablet, Take 100 mg by mouth every 2 (two) hours as needed for migraine or headache., Disp: , Rfl:    vitamin B-12 (CYANOCOBALAMIN) 500 MCG tablet, Take 500 mcg by mouth daily., Disp: , Rfl:   Orders Placed This Encounter  Procedures   Lipid Panel With LDL/HDL Ratio   Lipoprotein A (LPA)   LDL cholesterol, direct   CMP14+EGFR   Apo A1 + B + Ratio   EKG 12-Lead   --Continue cardiac medications as reconciled in final medication list. --Return in about 20 weeks (around 07/26/2021) for Follow up carotid artery disease. . Or sooner if needed. --Continue follow-up with your primary care physician regarding the management of your other chronic comorbid conditions.  Patient's questions and concerns were addressed to her satisfaction. She voices understanding of the instructions provided during this encounter.   This note was created using a voice recognition software as a result there may be grammatical errors inadvertently enclosed that do not reflect the nature of this encounter. Every attempt is made to correct such errors.  Total time spent: 30 minutes  Mechele Claude [facility]  Pager: [phone] Office:  [phone]

## 2021-03-09 ENCOUNTER — Other Ambulatory Visit: Payer: Self-pay | Admitting: Cardiology

## 2021-03-09 ENCOUNTER — Encounter: Payer: Self-pay | Admitting: Nurse Practitioner

## 2021-03-09 ENCOUNTER — Ambulatory Visit (INDEPENDENT_AMBULATORY_CARE_PROVIDER_SITE_OTHER): Payer: Medicare HMO | Admitting: Nurse Practitioner

## 2021-03-09 VITALS — BP 116/69 | HR 56 | Temp 95.4°F | Ht 62.5 in | Wt 132.4 lb

## 2021-03-09 DIAGNOSIS — I25810 Atherosclerosis of coronary artery bypass graft(s) without angina pectoris: Secondary | ICD-10-CM

## 2021-03-09 DIAGNOSIS — Z951 Presence of aortocoronary bypass graft: Secondary | ICD-10-CM

## 2021-03-09 DIAGNOSIS — I10 Essential (primary) hypertension: Secondary | ICD-10-CM

## 2021-03-09 DIAGNOSIS — F331 Major depressive disorder, recurrent, moderate: Secondary | ICD-10-CM | POA: Diagnosis not present

## 2021-03-09 DIAGNOSIS — Z7689 Persons encountering health services in other specified circumstances: Secondary | ICD-10-CM | POA: Diagnosis not present

## 2021-03-09 DIAGNOSIS — I2510 Atherosclerotic heart disease of native coronary artery without angina pectoris: Secondary | ICD-10-CM

## 2021-03-09 DIAGNOSIS — Z720 Tobacco use: Secondary | ICD-10-CM | POA: Diagnosis not present

## 2021-03-09 DIAGNOSIS — G43709 Chronic migraine without aura, not intractable, without status migrainosus: Secondary | ICD-10-CM

## 2021-03-09 DIAGNOSIS — F5101 Primary insomnia: Secondary | ICD-10-CM

## 2021-03-09 MED ORDER — LOSARTAN POTASSIUM 25 MG PO TABS: 25.0000 mg | ORAL_TABLET | Freq: Every evening | ORAL | 1 refills | Status: DC

## 2021-03-09 MED ORDER — TRAZODONE HCL 50 MG PO TABS: 25.0000 mg | ORAL_TABLET | Freq: Every evening | ORAL | 3 refills | Status: DC | PRN

## 2021-03-09 MED ORDER — METOPROLOL SUCCINATE ER 25 MG PO TB24: 25.0000 mg | ORAL_TABLET | Freq: Every day | ORAL | 0 refills | Status: DC

## 2021-03-09 MED ORDER — BUPROPION HCL ER (SR) 100 MG PO TB12: 100.0000 mg | ORAL_TABLET | Freq: Two times a day (BID) | ORAL | 2 refills | Status: DC

## 2021-03-09 MED ORDER — NURTEC 75 MG PO TBDP: ORAL_TABLET | ORAL | 0 refills | Status: DC

## 2021-03-09 NOTE — Progress Notes (Signed)
New Patient Office Visit  Subjective:  Patient ID: Regina Perry, female    DOB: 1960-10-06  Age: 60 y.o. MRN: [id number]  CC:  Chief Complaint  Patient presents with   Establish Care    Patient states she wants to quit smoking.  She has migraines and sleeping habits are off.    HPI Regina Perry presents to establish new primary care provider. She has history of CAD. Had to have open heart surgery. Had double bypass surgery. Does see cardiology. She saw him yesterday. She is due to have check of lipid panel. She does have carotid artery stenosis, not severe enough for surgery. Currently on rosuvastatin. Cardiology considering injectable medication for additional lipid lowering.  The patient did see GYN earlier this year. She does not need pap smear. She did have partial hysterectomy, removing her cervix. This was done in 2008. She does have her ovaries.  She has had surgery to her lower back and shoulders. She no longer sees orthopedic surgery.  The patient states that she would like to quit smoking. She has been smoking since she was 60 years old. States that she smokes a little less than a pack of cigarettes. She states that she needs to have a little help with this. Her depression score is elevated today. Her dad has been sick and in the hospital. This is causing anxiety and difficulty eating.   Depression screen PHQ 2/9 03/09/2021  Decreased Interest 0  Down, Depressed, Hopeless 1  PHQ - 2 Score 1  Altered sleeping 3  Tired, decreased energy 1  Change in appetite 1  Feeling bad or failure about yourself  0  Trouble concentrating 1  Moving slowly or fidgety/restless 0  Suicidal thoughts 0  PHQ-9 Score 7  Difficult doing work/chores Somewhat difficult   She does have trouble sleeping.  She states that her brain wants to "work overtime." Won't calm down and let her sleep. She is taking lexapro 20mg  daily and her alprazolam 0.5mg  is written as twice daily which she takes in the  morning and in the afternoon.  She does get migraine headaches. She states that the last two or three weeks, she has had three migraines which have lasted a few days. She currently has prescription for imitrex, however, her cardiolgist does not feel like this is best choice for her due to existing heart condition.   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   CORONARY ARTER(CABG) X2  LIMA to LAD and SVG TO OM1 01/09/19    Vitamin D deficiency     Past Surgical History:  Procedure Laterality Date   ABDOMINAL HYSTERECTOMY     partial   CESAREAN SECTION     COLONOSCOPY  CORONARY ARTERY BYPASS GRAFT N/A 01/09/2019   Procedure: CORONARY ARTERY BYPASS GRAFTING (CABG) X2 USING LEFT INTERNAL MAMMARY ARTERY TO CIRC AND RIGHT GREATER SAPHENOUS VEIN TO LAD.;  Surgeon: Corliss Skains, MD;  Location: [facility] OR;  Service: Open Heart Surgery;  Laterality: N/A;   CORONARY ARTERY BYPASS GRAFT     ETHMOIDECTOMY Bilateral 06/26/2014   Procedure: BILATERAL ANTERIOR ETHMOIDECTOMY;  Surgeon: Drema Halon, MD;  Location: [facility];  Service: ENT;  Laterality: Bilateral;   LEFT HEART CATH AND CORONARY ANGIOGRAPHY N/A 01/08/2019   Procedure: LEFT HEART CATH AND CORONARY ANGIOGRAPHY;  Surgeon: Elder Negus, MD;  Location: [facility];  Service: Cardiovascular;  Laterality:  N/A;   MAXILLARY ANTROSTOMY Bilateral 06/26/2014   Procedure: BILATERAL MAXILLARY OSTEA ENLARGEMENT;  Surgeon: Drema Halon, MD;  Location: [facility];  Service: ENT;  Laterality: Bilateral;   NASAL SEPTOPLASTY W/ TURBINOPLASTY Bilateral 06/26/2014   Procedure: BILATERAL NASAL SEPTOPLASTY WITH TURBINATE REDUCTION;  Surgeon: Drema Halon, MD;  Location: [facility];  Service: ENT;  Laterality: Bilateral;   SHOULDER ARTHROSCOPY W/ ROTATOR CUFF REPAIR  6/15   right   TEE WITHOUT CARDIOVERSION  01/09/2019   Procedure: Transesophageal Echocardiogram (Tee);  Surgeon: Corliss Skains, MD;  Location: [facility] OR;  Service: Open Heart Surgery;;   TONSILLECTOMY AND ADENOIDECTOMY     TUBAL LIGATION      Family History  Problem Relation Age of Onset   Heart disease Mother        MI   Irritable bowel syndrome Mother    Migraines Mother    Aneurysm Mother        brain   Cerebral aneurysm Father    AAA (abdominal aortic aneurysm) Sister    Breast cancer Paternal Grandmother    Lung cancer Paternal Grandfather    Colon cancer Neg Hx    Esophageal cancer Neg Hx    Rectal cancer Neg Hx    Stomach cancer Neg Hx     Social History   Socioeconomic History   Marital status: Married    Spouse name: Not on file   Number of children: 2   Years of education: 12   Highest education level: Not on file  Occupational History   Occupation: unemployed  Tobacco Use   Smoking status: Some Days    Packs/day: 0.25    Years: 35.00    Pack years: 8.75    Types: Cigarettes    Start date: 12/30/2018    Last attempt to quit: 12/31/2018    Years since quitting: 2.2   Smokeless tobacco: Never   Tobacco comments:    Patient wants to quit smoking.  Vaping Use   Vaping Use: Never used  Substance and Sexual Activity   Alcohol use: No   Drug use: No   Sexual activity: Not on file  Other Topics Concern   Not on file  Social History Narrative   Patient drinks about  3-4 cups of caffeine daily.    Patient is right handed.    Social Determinants of Health   Financial Resource Strain: Not on file  Food Insecurity: Not on file  Transportation Needs: Not on file  Physical Activity: Not on file  Stress: Not on file  Social Connections: Not on file  Intimate Partner Violence: Not on file    ROS Review of Systems  Constitutional:  Positive for fatigue. Negative for activity change, appetite change, chills and fever.  HENT:  Negative for congestion, postnasal drip, rhinorrhea, sinus pressure, sinus pain, sneezing and sore throat.   Eyes: Negative.   Respiratory:  Positive for shortness of breath. Negative for cough, chest tightness and wheezing.   Cardiovascular:  Negative for chest pain and palpitations.  Gastrointestinal:  Negative for abdominal pain, constipation, diarrhea, nausea and vomiting.  Endocrine: Negative for cold intolerance, heat intolerance, polydipsia and polyuria.  Genitourinary:  Negative for dyspareunia, dysuria, flank pain, frequency and urgency.  Musculoskeletal:  Negative for arthralgias, back pain and myalgias.  Skin:  Negative for rash.  Allergic/Immunologic: Negative for environmental allergies.  Neurological:  Negative for dizziness, weakness and headaches.  Hematological:  Negative for adenopathy.  Psychiatric/Behavioral:  Positive for dysphoric mood and sleep disturbance. The patient is nervous/anxious.    Objective:   Today's Vitals   03/09/21 1437  BP: 116/69  Pulse: (!) 56  Temp: (!) 95.4 F (35.2 C)  SpO2: 96%  Weight: 132 lb 6.4 oz (60.1 kg)  Height: 5' 2.5" (1.588 m)   Body mass index is 23.83 kg/m.   Physical Exam Vitals and nursing note reviewed.  Constitutional:      Appearance: Normal appearance. She is well-developed.  HENT:     Head: Normocephalic and atraumatic.     Nose: Nose normal.     Mouth/Throat:     Mouth: Mucous membranes are moist.     Pharynx: Oropharynx is clear.  Eyes:      Extraocular Movements: Extraocular movements intact.     Conjunctiva/sclera: Conjunctivae normal.     Pupils: Pupils are equal, round, and reactive to light.  Cardiovascular:     Rate and Rhythm: Normal rate and regular rhythm.     Pulses: Normal pulses.     Heart sounds: Normal heart sounds.  Pulmonary:     Effort: Pulmonary effort is normal.     Breath sounds: Normal breath sounds.  Abdominal:     Palpations: Abdomen is soft.  Musculoskeletal:        General: Normal range of motion.     Cervical back: Normal range of motion and neck supple.  Lymphadenopathy:     Cervical: No cervical adenopathy.  Skin:    General: Skin is warm and dry.     Capillary Refill: Capillary refill takes less than 2 seconds.  Neurological:     General: No focal deficit present.     Mental Status: She is alert and oriented to person, place, and time.  Psychiatric:        Attention and Perception: Attention and perception normal.        Mood and Affect: Affect normal. Mood is anxious.        Speech: Speech normal.        Behavior: Behavior normal. Behavior is cooperative.        Thought Content: Thought content normal.        Cognition and Memory: Cognition and memory normal.        Judgment: Judgment normal.    Assessment & Plan:  1. Encounter to establish care Appointment today to establish new primary care provider    2. Primary hypertension Stable. Continue bp medication as prescribed. Refill losartan today.  - losartan (COZAAR) 25 MG tablet; Take 1 tablet (25 mg total) by mouth every evening.  Dispense: 90 tablet; Refill: 1  3. Moderate episode of recurrent major depressive disorder (HCC) Start  wellbutrin SR  daily for one to two weeks. May increase to twice daily as needed and as  tolerated.  - buPROPion ER (WELLBUTRIN SR) 100 MG 12 hr tablet; Take 1 tablet (100 mg total) by mouth 2 (two) times daily.  Dispense: 30 tablet; Refill: 2  4. Tobacco abuse Start  wellbutrin SR 100mg  daily  for one to two weeks. May increase to twice daily as needed and as tolerated.  - buPROPion ER (WELLBUTRIN SR) 100 MG 12 hr tablet; Take 1 tablet (100 mg total) by mouth 2 (two) times daily.  Dispense: 30 tablet; Refill: 2  5. Primary insomnia Add trazodone 50mg  - take 1/2 to 1 tablet at bedtime as needed for insomnia and anxiety.  - traZODone (DESYREL) 50 MG tablet; Take 0.5-1 tablets (25-50 mg total) by mouth at bedtime as needed for sleep.  Dispense: 30 tablet; Refill: 3  6. Chronic migraine without aura without status migrainosus, not intractable Patient unable to take triptans due to CAD and hypertension. Trial Nurtec 75mg  daily as needed. Sample provided today.  - Rimegepant Sulfate (NURTEC) 75 MG TBDP; Take 1 tablet by mouth x 1 dose for acute headache. (Max 75 mg/day)  Dispense: 10 tablet; Refill: 0  7. Coronary artery disease involving coronary bypass graft of native heart without angina pectoris Patient should continue routine visits with cardiology.    Problem List Items Addressed This Visit       Cardiovascular and Mediastinum   Hypertension   Relevant Medications   losartan (COZAAR) 25 MG tablet   Coronary artery disease involving coronary bypass graft of native heart   Relevant Medications   losartan (COZAAR) 25 MG tablet   Chronic migraine without aura without status migrainosus, not intractable   Relevant Medications   buPROPion ER (WELLBUTRIN SR) 100 MG 12 hr tablet   traZODone (DESYREL) 50 MG tablet   Rimegepant Sulfate (NURTEC) 75 MG TBDP   losartan (COZAAR) 25 MG tablet     Other   Tobacco abuse   Relevant Medications   buPROPion ER (WELLBUTRIN SR) 100 MG 12 hr tablet   Moderate episode of recurrent major depressive disorder (HCC)   Relevant Medications   buPROPion ER (WELLBUTRIN SR) 100 MG 12 hr tablet   traZODone (DESYREL) 50 MG tablet   Primary insomnia   Relevant Medications   traZODone (DESYREL) 50 MG tablet   Other Visit Diagnoses     Encounter to  establish care    -  Primary       Outpatient Encounter Medications as of 03/09/2021  Medication Sig   buPROPion ER (WELLBUTRIN SR) 100 MG 12 hr tablet Take 1 tablet (100 mg total) by mouth 2 (two) times daily.   Rimegepant Sulfate (NURTEC) 75 MG TBDP Take 1 tablet by mouth x 1 dose for acute headache. (Max 75 mg/day)   traZODone (DESYREL) 50 MG tablet Take 0.5-1 tablets (25-50 mg total) by mouth at bedtime as needed for sleep.   acetaminophen (TYLENOL) 500 MG tablet Take 1,000 mg by mouth every 6 (six) hours as needed for moderate pain or headache.   ALPRAZolam (XANAX) 0.5 MG tablet Take 0.5 mg by mouth in the morning and at bedtime.   aspirin EC 81 MG tablet Take 1 tablet (81 mg total) by mouth daily.   cetirizine (ZYRTEC) 10 MG tablet Take 10 mg by mouth at bedtime.   clopidogrel (PLAVIX) 75 MG tablet Take 75 mg by mouth daily.   escitalopram (LEXAPRO) 20 MG tablet Take 20 mg by mouth at bedtime.   gabapentin (NEURONTIN) 300 MG capsule Take 1 capsule (300 mg total) by  mouth 3 (three) times daily.   HYDROcodone-acetaminophen (NORCO) 10-325 MG tablet Take 1 tablet by mouth every 6 (six) hours as needed for moderate pain.   losartan (COZAAR) 25 MG tablet Take 1 tablet (25 mg total) by mouth every evening.   metoprolol succinate (TOPROL-XL) 25 MG 24 hr tablet Take 1 tablet (25 mg total) by mouth daily.   nitroGLYCERIN (NITROSTAT) 0.4 MG SL tablet Place 0.4 mg under the tongue every 5 (five) minutes as needed for chest pain.   pantoprazole (PROTONIX) 40 MG tablet Take 1 tablet (40 mg total) by mouth daily. (Patient taking differently: Take 40 mg by mouth at bedtime.)   promethazine (PHENERGAN) 25 MG tablet Take 25 mg every 6 (six) hours as needed by mouth for nausea or vomiting.   rosuvastatin (CRESTOR) 40 MG tablet TAKE 1 TABLET (40 MG TOTAL) BY MOUTH DAILY.   SUMAtriptan (IMITREX) 100 MG tablet Take 100 mg by mouth every 2 (two) hours as needed for migraine or headache.   vitamin B-12  (CYANOCOBALAMIN) 500 MCG tablet Take 500 mcg by mouth daily. (Patient not taking: Reported on 03/09/2021)   [DISCONTINUED] losartan (COZAAR) 25 MG tablet Take 25 mg by mouth every evening.   No facility-administered encounter medications on file as of 03/09/2021.    Follow-up: Return in about 4 weeks (around 04/06/2021) for medicare wellness, FBW with Free T4 in 2 weeks. need to get records from [facility] .   Carlean Jews, NP

## 2021-03-16 ENCOUNTER — Other Ambulatory Visit: Payer: Self-pay

## 2021-03-16 DIAGNOSIS — E785 Hyperlipidemia, unspecified: Secondary | ICD-10-CM

## 2021-03-16 DIAGNOSIS — E039 Hypothyroidism, unspecified: Secondary | ICD-10-CM

## 2021-03-16 DIAGNOSIS — Z0000 Encounter for general adult medical examination without abnormal findings: Secondary | ICD-10-CM

## 2021-03-18 ENCOUNTER — Encounter: Payer: Self-pay | Admitting: Nurse Practitioner

## 2021-03-18 DIAGNOSIS — F5101 Primary insomnia: Secondary | ICD-10-CM | POA: Insufficient documentation

## 2021-03-18 DIAGNOSIS — G43709 Chronic migraine without aura, not intractable, without status migrainosus: Secondary | ICD-10-CM | POA: Insufficient documentation

## 2021-03-18 DIAGNOSIS — F331 Major depressive disorder, recurrent, moderate: Secondary | ICD-10-CM | POA: Insufficient documentation

## 2021-03-23 ENCOUNTER — Other Ambulatory Visit: Payer: Medicare HMO

## 2021-03-31 ENCOUNTER — Other Ambulatory Visit: Payer: Medicare HMO

## 2021-03-31 ENCOUNTER — Other Ambulatory Visit: Payer: Self-pay

## 2021-03-31 DIAGNOSIS — E039 Hypothyroidism, unspecified: Secondary | ICD-10-CM

## 2021-03-31 DIAGNOSIS — E785 Hyperlipidemia, unspecified: Secondary | ICD-10-CM

## 2021-03-31 DIAGNOSIS — Z0000 Encounter for general adult medical examination without abnormal findings: Secondary | ICD-10-CM

## 2021-04-01 ENCOUNTER — Encounter: Payer: Self-pay | Admitting: Nurse Practitioner

## 2021-04-01 LAB — CBC WITH DIFFERENTIAL/PLATELET
Basophils Absolute: 0.1 10*3/uL (ref 0.0–0.2)
Basos: 1 %
EOS (ABSOLUTE): 0.2 10*3/uL (ref 0.0–0.4)
Eos: 2 %
Hematocrit: 43.3 % (ref 34.0–46.6)
Hemoglobin: 14.8 g/dL (ref 11.1–15.9)
Immature Grans (Abs): 0 10*3/uL (ref 0.0–0.1)
Immature Granulocytes: 0 %
Lymphocytes Absolute: 2 10*3/uL (ref 0.7–3.1)
Lymphs: 23 %
MCH: 30.4 pg (ref 26.6–33.0)
MCHC: 34.2 g/dL (ref 31.5–35.7)
MCV: 89 fL (ref 79–97)
Monocytes Absolute: 0.4 10*3/uL (ref 0.1–0.9)
Monocytes: 5 %
Neutrophils Absolute: 5.7 10*3/uL (ref 1.4–7.0)
Neutrophils: 69 %
Platelets: 158 10*3/uL (ref 150–450)
RBC: 4.87 x10E6/uL (ref 3.77–5.28)
RDW: 13.1 % (ref 11.7–15.4)
WBC: 8.4 10*3/uL (ref 3.4–10.8)

## 2021-04-01 LAB — LIPID PANEL
Chol/HDL Ratio: 2.6 ratio (ref 0.0–4.4)
Cholesterol, Total: 146 mg/dL (ref 100–199)
HDL: 57 mg/dL
LDL Chol Calc (NIH): 70 mg/dL (ref 0–99)
Triglycerides: 103 mg/dL (ref 0–149)
VLDL Cholesterol Cal: 19 mg/dL (ref 5–40)

## 2021-04-01 LAB — T4, FREE: Free T4: 1.09 ng/dL (ref 0.82–1.77)

## 2021-04-01 LAB — HEMOGLOBIN A1C
Est. average glucose Bld gHb Est-mCnc: 117 mg/dL
Hgb A1c MFr Bld: 5.7 % — ABNORMAL HIGH (ref 4.8–5.6)

## 2021-04-01 LAB — COMPREHENSIVE METABOLIC PANEL
ALT: 13 IU/L (ref 0–32)
AST: 16 IU/L (ref 0–40)
Albumin/Globulin Ratio: 2.1 (ref 1.2–2.2)
Albumin: 4.4 g/dL (ref 3.8–4.9)
Alkaline Phosphatase: 66 IU/L (ref 44–121)
BUN/Creatinine Ratio: 10 (ref 9–23)
BUN: 13 mg/dL (ref 6–24)
Bilirubin Total: 0.4 mg/dL (ref 0.0–1.2)
CO2: 23 mmol/L (ref 20–29)
Calcium: 9 mg/dL (ref 8.7–10.2)
Chloride: 107 mmol/L — ABNORMAL HIGH (ref 96–106)
Creatinine, Ser: 1.26 mg/dL — ABNORMAL HIGH (ref 0.57–1.00)
Globulin, Total: 2.1 g/dL (ref 1.5–4.5)
Glucose: 99 mg/dL (ref 70–99)
Potassium: 4.5 mmol/L (ref 3.5–5.2)
Sodium: 143 mmol/L (ref 134–144)
Total Protein: 6.5 g/dL (ref 6.0–8.5)
eGFR: 49 mL/min/{1.73_m2} — ABNORMAL LOW (ref >59)

## 2021-04-01 LAB — TSH: TSH: 2.73 u[IU]/mL (ref 0.450–4.500)

## 2021-04-01 NOTE — Progress Notes (Signed)
Mildly elevated renal functions. Recommended increased water intake. Other labs good. Mychart message sent to patient.

## 2021-04-07 ENCOUNTER — Encounter: Payer: Medicare HMO | Admitting: Nurse Practitioner

## 2021-04-11 ENCOUNTER — Other Ambulatory Visit: Payer: Self-pay | Admitting: Cardiology

## 2021-04-13 ENCOUNTER — Other Ambulatory Visit: Payer: Self-pay | Admitting: Cardiology

## 2021-04-13 DIAGNOSIS — Z951 Presence of aortocoronary bypass graft: Secondary | ICD-10-CM

## 2021-04-13 DIAGNOSIS — I2510 Atherosclerotic heart disease of native coronary artery without angina pectoris: Secondary | ICD-10-CM

## 2021-04-13 DIAGNOSIS — K219 Gastro-esophageal reflux disease without esophagitis: Secondary | ICD-10-CM

## 2021-04-19 ENCOUNTER — Encounter: Payer: Medicare HMO | Admitting: Nurse Practitioner

## 2021-04-20 ENCOUNTER — Other Ambulatory Visit: Payer: Self-pay

## 2021-04-20 ENCOUNTER — Ambulatory Visit (INDEPENDENT_AMBULATORY_CARE_PROVIDER_SITE_OTHER): Payer: Medicare HMO | Admitting: Nurse Practitioner

## 2021-04-20 ENCOUNTER — Encounter: Payer: Self-pay | Admitting: Nurse Practitioner

## 2021-04-20 VITALS — BP 107/56 | HR 50 | Temp 98.1°F | Ht 63.0 in | Wt 134.7 lb

## 2021-04-20 DIAGNOSIS — M25561 Pain in right knee: Secondary | ICD-10-CM

## 2021-04-20 DIAGNOSIS — E2839 Other primary ovarian failure: Secondary | ICD-10-CM

## 2021-04-20 DIAGNOSIS — I25810 Atherosclerosis of coronary artery bypass graft(s) without angina pectoris: Secondary | ICD-10-CM | POA: Diagnosis not present

## 2021-04-20 DIAGNOSIS — M25512 Pain in left shoulder: Secondary | ICD-10-CM

## 2021-04-20 DIAGNOSIS — Z0000 Encounter for general adult medical examination without abnormal findings: Secondary | ICD-10-CM | POA: Diagnosis not present

## 2021-04-20 DIAGNOSIS — F17200 Nicotine dependence, unspecified, uncomplicated: Secondary | ICD-10-CM

## 2021-04-20 DIAGNOSIS — Z13820 Encounter for screening for osteoporosis: Secondary | ICD-10-CM

## 2021-04-20 NOTE — Progress Notes (Signed)
Subjective:   Regina Perry is a 61 y.o. female who presents for Medicare Annual (Subsequent) preventive examination.  -right knee pain. Started Saturday. Mild swelling and tenderness. Hurts more with walking. She states that she has iced her knee. Took ibuprofen on Saturday. Took tylenol on Sunday. It did help a little,  -unusual bruising with normal CBC -recently had normal fasting labs.  -abnormal mammogram. Needs to follow up with [facility].  -will need to [facility] to review. -would like to get shingles vaccine. Will make appointment for this.  -current smoker. Has 42 pack year history. Has not had lung cancer screening.  Has spoke with smoking cessation counselor and has chantix coming to help her stop  Sees pain management provider for pain medication and alprazolam .  Review of Systems    Review of Systems  Constitutional:  Negative for fever, malaise/fatigue and weight loss.  HENT:  Negative for congestion, ear discharge, ear pain, hearing loss and sore throat.   Eyes: Negative.   Respiratory:  Negative for cough, shortness of breath and wheezing.   Cardiovascular:  Negative for chest pain and orthopnea.  Gastrointestinal:  Negative for abdominal pain, blood in stool, constipation, diarrhea, nausea and vomiting.  Genitourinary:  Negative for dysuria, flank pain, frequency and urgency.  Musculoskeletal:  Positive for joint pain and myalgias. Negative for back pain.       Right knee with mild swelling.   Skin:  Negative for itching and rash.       Has noticed some unusual bruising of upper arms and lower legs.   Neurological:  Negative for dizziness, weakness and headaches.  Endo/Heme/Allergies:  Bruises/bleeds easily.  Psychiatric/Behavioral:  Negative for depression.          Objective:   Physical Exam Vitals and nursing note reviewed.  Constitutional:      Appearance: Normal appearance. She is well-developed.  HENT:      Head: Normocephalic and atraumatic.     Right Ear: Tympanic membrane, ear canal and external ear normal.     Left Ear: Tympanic membrane, ear canal and external ear normal.     Nose: Nose normal.     Mouth/Throat:     Mouth: Mucous membranes are moist.     Pharynx: Oropharynx is clear.  Eyes:     Extraocular Movements: Extraocular movements intact.     Conjunctiva/sclera: Conjunctivae normal.     Pupils: Pupils are equal, round, and reactive to light.  Neck:     Vascular: No carotid bruit.  Cardiovascular:     Rate and Rhythm: Normal rate and regular rhythm.     Pulses: Normal pulses.     Heart sounds: Normal heart sounds.  Pulmonary:     Effort: Pulmonary effort is normal.     Breath sounds: Normal breath sounds.  Abdominal:     General: Bowel sounds are normal. There is no distension.     Palpations: Abdomen is soft. There is no mass.     Tenderness: There is no abdominal tenderness. There is no guarding or rebound.     Hernia: No hernia is present.  Musculoskeletal:        General: Normal range of motion.     Cervical back: Normal range of motion and neck supple.     Comments: Mild right knee pain. Hurts more with flexion and extension of the knee. Mild swelling present in medial aspect of the right knee .  Lymphadenopathy:     Cervical: No cervical adenopathy.  Skin:    General: Skin is warm and dry.     Capillary Refill: Capillary refill takes less than 2 seconds.  Neurological:     General: No focal deficit present.     Mental Status: She is alert and oriented to person, place, and time.  Psychiatric:        Mood and Affect: Mood normal.        Behavior: Behavior normal.        Thought Content: Thought content normal.        Judgment: Judgment normal.    Today's Vitals   04/20/21 1358  BP: (!) 107/56  Pulse: (!) 50  Temp: 98.1 F (36.7 C)  SpO2: 97%  Weight: 134 lb 11.2 oz (61.1 kg)  Height: 5\' 3"  (1.6 m)   Body mass index is 23.86 kg/m.  Advanced  Directives 08/27/2020 11/13/2019 01/08/2019 02/07/2017 02/02/2017 12/09/2015 08/12/2015  Does Patient Have a Medical Advance Directive? No No No No No No No  Would patient like information on creating a medical advance directive? - - No - Patient declined No - Patient declined No - Patient declined - No - patient declined information  Pre-existing out of facility DNR order (yellow form or pink MOST form) - - - - - - -    Current Medications (verified) Outpatient Encounter Medications as of 04/20/2021  Medication Sig   acetaminophen (TYLENOL) 500 MG tablet Take 1,000 mg by mouth every 6 (six) hours as needed for moderate pain or headache.   ALPRAZolam (XANAX) 0.5 MG tablet Take 0.5 mg by mouth in the morning and at bedtime.   aspirin EC 81 MG tablet Take 1 tablet (81 mg total) by mouth daily.   buPROPion ER (WELLBUTRIN SR) 100 MG 12 hr tablet Take 1 tablet (100 mg total) by mouth 2 (two) times daily.   cetirizine (ZYRTEC) 10 MG tablet Take 10 mg by mouth at bedtime.   clopidogrel (PLAVIX) 75 MG tablet TAKE 1 TABLET EVERY DAY   escitalopram (LEXAPRO) 20 MG tablet Take 20 mg by mouth at bedtime.   gabapentin (NEURONTIN) 300 MG capsule Take 1 capsule (300 mg total) by mouth 3 (three) times daily.   HYDROcodone-acetaminophen (NORCO) 10-325 MG tablet Take 1 tablet by mouth every 6 (six) hours as needed for moderate pain.   losartan (COZAAR) 25 MG tablet Take 1 tablet (25 mg total) by mouth every evening.   metoprolol succinate (TOPROL-XL) 25 MG 24 hr tablet TAKE 1 TABLET EVERY DAY   nitroGLYCERIN (NITROSTAT) 0.4 MG SL tablet Place 0.4 mg under the tongue every 5 (five) minutes as needed for chest pain.   pantoprazole (PROTONIX) 40 MG tablet TAKE 1 TABLET EVERY DAY   promethazine (PHENERGAN) 25 MG tablet Take 25 mg every 6 (six) hours as needed by mouth for nausea or vomiting.   Rimegepant Sulfate (NURTEC) 75 MG TBDP Take 1 tablet by mouth x 1 dose for acute headache. (Max 75 mg/day)   rosuvastatin  (CRESTOR) 40 MG tablet TAKE 1 TABLET (40 MG TOTAL) BY MOUTH DAILY.   SUMAtriptan (IMITREX) 100 MG tablet Take 100 mg by mouth every 2 (two) hours as needed for migraine or headache.   traZODone (DESYREL) 50 MG tablet Take 0.5-1 tablets (25-50 mg total) by mouth at bedtime as needed for sleep.   vitamin B-12 (CYANOCOBALAMIN) 500 MCG tablet Take 500 mcg by mouth daily. (Patient not taking: Reported on 03/09/2021)   No  facility-administered encounter medications on file as of 04/20/2021.    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   CORONARY ARTER(CABG) X2  LIMA to LAD and SVG TO OM1 01/09/19    Vitamin D deficiency    Past Surgical History:  Procedure Laterality Date   ABDOMINAL HYSTERECTOMY     partial   CESAREAN SECTION     COLONOSCOPY     CORONARY ARTERY BYPASS GRAFT N/A 01/09/2019   Procedure: CORONARY ARTERY BYPASS GRAFTING (CABG) X2 USING LEFT INTERNAL MAMMARY ARTERY TO CIRC AND RIGHT GREATER SAPHENOUS VEIN TO LAD.;  Surgeon: Corliss Skains, MD;  Location: [facility] OR;  Service: Open Heart Surgery;  Laterality: N/A;   CORONARY ARTERY BYPASS GRAFT     ETHMOIDECTOMY Bilateral 06/26/2014   Procedure: BILATERAL ANTERIOR ETHMOIDECTOMY;  Surgeon: Drema Halon, MD;  Location: [facility];  Service: ENT;  Laterality: Bilateral;   LEFT HEART CATH AND CORONARY ANGIOGRAPHY N/A 01/08/2019   Procedure: LEFT HEART CATH AND CORONARY ANGIOGRAPHY;  Surgeon: Elder Negus, MD;  Location: [facility];  Service: Cardiovascular;  Laterality: N/A;   MAXILLARY ANTROSTOMY Bilateral 06/26/2014   Procedure: BILATERAL MAXILLARY OSTEA ENLARGEMENT;  Surgeon: Drema Halon, MD;  Location: [facility];  Service: ENT;  Laterality: Bilateral;   NASAL SEPTOPLASTY W/ TURBINOPLASTY Bilateral 06/26/2014   Procedure: BILATERAL NASAL SEPTOPLASTY WITH TURBINATE REDUCTION;  Surgeon: Drema Halon, MD;  Location: [facility];  Service: ENT;  Laterality: Bilateral;   SHOULDER ARTHROSCOPY W/ ROTATOR CUFF REPAIR  6/15   right   TEE WITHOUT CARDIOVERSION  01/09/2019   Procedure: Transesophageal Echocardiogram (Tee);  Surgeon: Corliss Skains, MD;  Location: [facility];  Service: Open Heart Surgery;;   TONSILLECTOMY AND ADENOIDECTOMY     TUBAL LIGATION     Family History  Problem Relation Age of Onset   Heart disease Mother        MI   Irritable bowel syndrome Mother    Migraines Mother    Aneurysm Mother        brain   Cerebral aneurysm Father    AAA (abdominal aortic aneurysm) Sister    Breast cancer Paternal Grandmother    Lung cancer Paternal Grandfather    Colon cancer Neg Hx    Esophageal cancer Neg Hx    Rectal cancer Neg Hx    Stomach cancer Neg Hx    Social History   Socioeconomic History   Marital status: Married    Spouse name: Not on file   Number of children: 2   Years of education: 12   Highest education level: Not on file  Occupational History   Occupation:  unemployed  Tobacco Use   Smoking status: Some Days    Packs/day: 0.25    Years: 35.00    Pack years: 8.75    Types: Cigarettes    Start date: 12/30/2018    Last attempt to quit: 12/31/2018    Years since quitting: 2.3   Smokeless tobacco: Never   Tobacco  comments:    Patient wants to quit smoking.  Vaping Use   Vaping Use: Never used  Substance and Sexual Activity   Alcohol use: No   Drug use: No   Sexual activity: Not on file  Other Topics Concern   Not on file  Social History Narrative   Patient drinks about 3-4 cups of caffeine daily.    Patient is right handed.    Social Determinants of Health   Financial Resource Strain: Not on file  Food Insecurity: Not on file  Transportation Needs: Not on file  Physical Activity: Not on file  Stress: Not on file  Social Connections: Not on file    Tobacco Counseling Ready to quit: Not Answered Counseling given: Not Answered Tobacco comments: Patient wants to quit smoking.   Diabetic?no   Activities of Daily Living No flowsheet data found.  Patient Care Team: Carlean JewsBoscia, Heather E, NP as PCP - General (Family Medicine)      Assessment:  1. Encounter for Medicare annual wellness exam Annual wellness visit today.  2. Coronary artery disease involving coronary bypass graft of native heart without angina pectoris Patient with chronic coronary artery disease.  Sees cardiology on routine basis for surveillance and management.  3. Acute pain of right knee Will get x-ray of right knee for further evaluation.  Refer to orthopedics as indicated. - DG Knee 1-2 Views Right; Future  4. Acute pain of left shoulder Refer to orthopedic surgery for further evaluation and treatment. - Ambulatory referral to Orthopedic Surgery  5. Estrogen deficiency Bone density test ordered for evaluation of osteoporosis. - DG Bone Density; Future  6. Screening for osteoporosis Bone density test for to evaluate for osteoporosis. - DG Bone Density; Future   7. Current every day smoker Will get CT chest for lung cancer screening. - CT CHEST LUNG CA SCREEN LOW DOSE W/O CM; Future    Hearing/Vision screen No results found.   Depression Screen PHQ 2/9 Scores 04/20/2021 03/09/2021  PHQ - 2 Score  0 1  PHQ- 9 Score 3 7    Fall Risk Fall Risk  04/20/2021 03/09/2021  Falls in the past year? 0 0  Number falls in past yr: 0 0  Injury with Fall? 0 0  Risk for fall due to : - No Fall Risks  Follow up Falls evaluation completed Falls evaluation completed    FALL RISK PREVENTION PERTAINING TO THE HOME:  Any stairs in or around the home? Yes  If so, are there any without handrails? Yes  Home free of loose throw rugs in walkways, pet beds, electrical cords, etc? Yes  Adequate lighting in your home to reduce risk of falls? Yes   ASSISTIVE DEVICES UTILIZED TO PREVENT FALLS:  Life alert? No  Use of a cane, walker or w/c? No  Grab bars in the bathroom? No  Shower chair or bench in shower? No  Elevated toilet seat or a handicapped toilet? No   TIMED UP AND GO:  Was the test performed? Yes .  Length of time to ambulate 10 feet: 12 sec.   Gait steady and fast without use of assistive  device  Cognitive Function:     6CIT Screen 04/20/2021  What Year? 0 points  What time? 0 points  Count back from 20 0 points  Months in reverse 2 points  Repeat phrase 0 points    Immunizations Immunization History  Administered Date(s) Administered   Influenza,inj,Quad PF,6+ Mos 02/02/2019, 03/10/2021   Influenza-Unspecified 01/22/2020   PFIZER(Purple Top)SARS-COV-2 Vaccination 06/20/2019, 07/15/2019, 02/06/2020, 01/12/2021   Pneumococcal Polysaccharide-23 04/10/2012    TDAP status: Due, Education has been provided regarding the importance of this vaccine. Advised may receive this vaccine at local pharmacy or Health Dept. Aware to provide a copy of the vaccination record if obtained from local pharmacy or Health Dept. Verbalized acceptance and understanding.  Flu Vaccine status: Up to date  Pneumococcal vaccine status: Up to date  Covid-19 vaccine status: Completed vaccines  Qualifies for Shingles Vaccine? Yes   Zostavax completed No   Shingrix Completed?: No.    Education has been  provided regarding the importance of this vaccine. Patient has been advised to call insurance company to determine out of pocket expense if they have not yet received this vaccine. Advised may also receive vaccine at local pharmacy or Health Dept. Verbalized acceptance and understanding.  Screening Tests Health Maintenance  Topic Date Due   TETANUS/TDAP  Never done   PAP SMEAR-Modifier  Never done   Zoster Vaccines- Shingrix (1 of 2) Never done   COVID-19 Vaccine (5 - Booster for Pfizer series) 03/09/2021   Hepatitis C Screening  04/20/2022 (Originally 08/22/1978)   COLONOSCOPY (Pts 45-1yrs Insurance coverage will need to be confirmed)  08/25/2022   MAMMOGRAM  12/11/2022   INFLUENZA VACCINE  Completed   HIV Screening  Completed   HPV VACCINES  Aged Out    Health Maintenance  Health Maintenance Due  Topic Date Due   TETANUS/TDAP  Never done   PAP SMEAR-Modifier  Never done   Zoster Vaccines- Shingrix (1 of 2) Never done   COVID-19 Vaccine (5 - Booster for Pfizer series) 03/09/2021    Colorectal cancer screening: Type of screening: Colonoscopy. Completed 2014. Repeat every 10 years  Mammogram status: Completed 12/18/2020. Repeat every year  Bone Density status: Ordered 04/20/2021. Pt provided with contact info and advised to call to schedule appt.  Lung Cancer Screening: (Low Dose CT Chest recommended if Age 36-80 years, 30 pack-year currently smoking OR have quit w/in 15years.) does qualify.   Lung Cancer Screening Referral: 04/20/2021  Additional Screening:  Hepatitis C Screening: does not qualify; Completed n/a  Vision Screening: Recommended annual ophthalmology exams for early detection of glaucoma and other disorders of the eye. Is the patient up to date with their annual eye exam?  No  Who is the provider or what is the name of the office in which the patient attends annual eye exams? N/a If pt is not established with a provider, would they like to be referred to a  provider to establish care? No .   Dental Screening: Recommended annual dental exams for proper oral hygiene  Community Resource Referral / Chronic Care Management: CRR required this visit?  No   CCM required this visit?  No      Plan:     I have personally reviewed and noted the following in the patients chart:   Medical and social history Use of alcohol, tobacco or illicit drugs  Current medications and supplements including opioid prescriptions.  Functional ability and status Nutritional status Physical activity Advanced directives List of other physicians Hospitalizations,  surgeries, and ER visits in previous 12 months Vitals Screenings to include cognitive, depression, and falls Referrals and appointments  In addition, I have reviewed and discussed with patient certain preventive protocols, quality metrics, and best practice recommendations. A written personalized care plan for preventive services as well as general preventive health recommendations were provided to patient.     Vincent Gros, Fnp-c      04/25/2021

## 2021-04-23 ENCOUNTER — Ambulatory Visit: Payer: Medicare HMO | Admitting: Cardiology

## 2021-04-25 DIAGNOSIS — M25512 Pain in left shoulder: Secondary | ICD-10-CM | POA: Insufficient documentation

## 2021-04-25 DIAGNOSIS — Z13820 Encounter for screening for osteoporosis: Secondary | ICD-10-CM | POA: Insufficient documentation

## 2021-04-25 DIAGNOSIS — F17200 Nicotine dependence, unspecified, uncomplicated: Secondary | ICD-10-CM | POA: Insufficient documentation

## 2021-04-25 DIAGNOSIS — M25561 Pain in right knee: Secondary | ICD-10-CM | POA: Insufficient documentation

## 2021-04-25 DIAGNOSIS — E2839 Other primary ovarian failure: Secondary | ICD-10-CM | POA: Insufficient documentation

## 2021-04-27 ENCOUNTER — Ambulatory Visit: Payer: Medicare HMO | Admitting: Nurse Practitioner

## 2021-04-27 ENCOUNTER — Ambulatory Visit
Admission: RE | Admit: 2021-04-27 | Discharge: 2021-04-27 | Disposition: A | Discharge status: Home / Self Care | Payer: Medicare HMO | Source: Ambulatory Visit | Attending: Nurse Practitioner | Admitting: Nurse Practitioner

## 2021-04-27 DIAGNOSIS — F17200 Nicotine dependence, unspecified, uncomplicated: Secondary | ICD-10-CM

## 2021-04-29 NOTE — Progress Notes (Signed)
Please let the patient know that CT of her chest was benign. No evidence of malignancy. This is something we should repeat every 12 months for surveillance.   Thanks so much.   -HB

## 2021-05-17 ENCOUNTER — Other Ambulatory Visit: Payer: Self-pay | Admitting: Radiology

## 2021-05-17 DIAGNOSIS — N6452 Nipple discharge: Secondary | ICD-10-CM

## 2021-05-26 ENCOUNTER — Other Ambulatory Visit: Payer: Self-pay

## 2021-05-26 ENCOUNTER — Ambulatory Visit (INDEPENDENT_AMBULATORY_CARE_PROVIDER_SITE_OTHER): Payer: Medicare HMO | Admitting: Nurse Practitioner

## 2021-05-26 ENCOUNTER — Other Ambulatory Visit: Payer: Self-pay | Admitting: Nurse Practitioner

## 2021-05-26 ENCOUNTER — Encounter: Payer: Self-pay | Admitting: Nurse Practitioner

## 2021-05-26 VITALS — BP 97/63 | HR 53 | Temp 98.7°F | Ht 63.0 in | Wt 134.4 lb

## 2021-05-26 DIAGNOSIS — H6690 Otitis media, unspecified, unspecified ear: Secondary | ICD-10-CM | POA: Diagnosis not present

## 2021-05-26 DIAGNOSIS — F5101 Primary insomnia: Secondary | ICD-10-CM

## 2021-05-26 DIAGNOSIS — Z720 Tobacco use: Secondary | ICD-10-CM | POA: Diagnosis not present

## 2021-05-26 DIAGNOSIS — J0140 Acute pansinusitis, unspecified: Secondary | ICD-10-CM | POA: Diagnosis not present

## 2021-05-26 DIAGNOSIS — Z1211 Encounter for screening for malignant neoplasm of colon: Secondary | ICD-10-CM

## 2021-05-26 DIAGNOSIS — F331 Major depressive disorder, recurrent, moderate: Secondary | ICD-10-CM

## 2021-05-26 MED ORDER — BUPROPION HCL ER (SR) 100 MG PO TB12: 100.0000 mg | ORAL_TABLET | Freq: Two times a day (BID) | ORAL | 3 refills | Status: DC

## 2021-05-26 MED ORDER — TRAZODONE HCL 50 MG PO TABS: 25.0000 mg | ORAL_TABLET | Freq: Every evening | ORAL | 3 refills | Status: DC | PRN

## 2021-05-26 MED ORDER — OFLOXACIN 0.3 % OT SOLN: 5.0000 [drp] | Freq: Every day | OTIC | 0 refills | Status: DC

## 2021-05-26 MED ORDER — AZITHROMYCIN 250 MG PO TABS: ORAL_TABLET | ORAL | 0 refills | Status: DC

## 2021-05-26 NOTE — Progress Notes (Signed)
Established patient visit ? ? ?Patient: Regina Perry   DOB: 27-Mar-1961   61 y.o. Female  MRN: [id number] ?Visit Date: 05/26/2021 ? ?Chief Complaint  ?Patient presents with  ? Ear Pain  ? ?Subjective  ?  ?HPI  ?The patient is c/o bilateral ear pain. More severe on the left ear. Sometimes, she can hear her heart beat and throbbing in the ear. Hurts down the jaw line hurt. Has been going on for about a month. Getting worse. Causing her to have vertigo. Did rupture her ear drum several months ago. Was put on antibiotics by previous provider. She states that it really never felt a lot better.  ?-blowing her nose causes increased pain. Sometimes blowing out blood.  ?-needs to have medication refills today.  ? ?Medications: ?Outpatient Medications Prior to Visit  ?Medication Sig  ? acetaminophen (TYLENOL) 500 MG tablet Take 1,000 mg by mouth every 6 (six) hours as needed for moderate pain or headache.  ? ALPRAZolam (XANAX) 0.5 MG tablet Take 0.5 mg by mouth in the morning and at bedtime.  ? aspirin EC 81 MG tablet Take 1 tablet (81 mg total) by mouth daily.  ? cetirizine (ZYRTEC) 10 MG tablet Take 10 mg by mouth at bedtime.  ? clopidogrel (PLAVIX) 75 MG tablet TAKE 1 TABLET EVERY DAY  ? escitalopram (LEXAPRO) 20 MG tablet Take 20 mg by mouth at bedtime.  ? gabapentin (NEURONTIN) 300 MG capsule Take 1 capsule (300 mg total) by mouth 3 (three) times daily.  ? HYDROcodone-acetaminophen (NORCO) 10-325 MG tablet Take 1 tablet by mouth every 6 (six) hours as needed for moderate pain.  ? losartan (COZAAR) 25 MG tablet Take 1 tablet (25 mg total) by mouth every evening.  ? metoprolol succinate (TOPROL-XL) 25 MG 24 hr tablet TAKE 1 TABLET EVERY DAY  ? nitroGLYCERIN (NITROSTAT) 0.4 MG SL tablet Place 0.4 mg under the tongue every 5 (five) minutes as needed for chest pain.  ? pantoprazole (PROTONIX) 40 MG tablet TAKE 1 TABLET EVERY DAY  ? promethazine (PHENERGAN) 25 MG tablet Take 25 mg every 6 (six) hours as needed by mouth for  nausea or vomiting.  ? Rimegepant Sulfate (NURTEC) 75 MG TBDP Take 1 tablet by mouth x 1 dose for acute headache. (Max 75 mg/day)  ? SUMAtriptan (IMITREX) 100 MG tablet Take 100 mg by mouth every 2 (two) hours as needed for migraine or headache.  ? [DISCONTINUED] buPROPion ER (WELLBUTRIN SR) 100 MG 12 hr tablet Take 1 tablet (100 mg total) by mouth 2 (two) times daily.  ? [DISCONTINUED] traZODone (DESYREL) 50 MG tablet Take 0.5-1 tablets (25-50 mg total) by mouth at bedtime as needed for sleep.  ? rosuvastatin (CRESTOR) 40 MG tablet TAKE 1 TABLET (40 MG TOTAL) BY MOUTH DAILY.  ? vitamin B-12 (CYANOCOBALAMIN) 500 MCG tablet Take 500 mcg by mouth daily. (Patient not taking: Reported on 05/26/2021)  ? ?No facility-administered medications prior to visit.  ? ? ?Review of Systems  ?Constitutional:  Positive for fatigue. Negative for activity change, appetite change, chills and fever.  ?HENT:  Positive for congestion, ear pain, nosebleeds, postnasal drip, rhinorrhea, sinus pressure, sinus pain, sore throat and tinnitus. Negative for sneezing.   ?Eyes: Negative.   ?Respiratory:  Negative for cough, chest tightness, shortness of breath and wheezing.   ?Cardiovascular:  Negative for chest pain and palpitations.  ?Gastrointestinal:  Negative for abdominal pain, constipation, diarrhea, nausea and vomiting.  ?Endocrine: Negative for cold intolerance, heat intolerance, polydipsia and polyuria.  ?  Genitourinary:  Negative for dyspareunia, dysuria, flank pain, frequency and urgency.  ?Musculoskeletal:  Negative for arthralgias, back pain and myalgias.  ?Skin:  Negative for rash.  ?Allergic/Immunologic: Negative for environmental allergies.  ?Neurological:  Negative for dizziness, weakness and headaches.  ?Hematological:  Negative for adenopathy.  ?Psychiatric/Behavioral:  The patient is not nervous/anxious.   ? ? Objective  ?  ? ?Today's Vitals  ? 05/26/21 1032  ?BP: 97/63  ?Pulse: (!) 53  ?Temp: 98.7 ?F (37.1 ?C)  ?SpO2: 98%   ?Weight: 134 lb 6.4 oz (61 kg)  ?Height: 5\' 3"  (1.6 m)  ? ?Body mass index is 23.81 kg/m?.  ? ?Physical Exam ?Vitals and nursing note reviewed.  ?Constitutional:   ?   Appearance: Normal appearance. She is well-developed.  ?HENT:  ?   Head: Normocephalic and atraumatic.  ?   Right Ear: Swelling present. Tympanic membrane is erythematous and bulging.  ?   Left Ear: Swelling present. Tympanic membrane is erythematous and bulging.  ?   Nose: Congestion present.  ?   Right Turbinates: Enlarged.  ?   Left Turbinates: Enlarged.  ?   Left Sinus: Maxillary sinus tenderness and frontal sinus tenderness present.  ?   Mouth/Throat:  ?   Pharynx: Posterior oropharyngeal erythema present.  ?Eyes:  ?   Pupils: Pupils are equal, round, and reactive to light.  ?Cardiovascular:  ?   Rate and Rhythm: Normal rate and regular rhythm.  ?   Pulses: Normal pulses.  ?   Heart sounds: Normal heart sounds.  ?Pulmonary:  ?   Effort: Pulmonary effort is normal.  ?   Breath sounds: Normal breath sounds.  ?Abdominal:  ?   Palpations: Abdomen is soft.  ?Musculoskeletal:     ?   General: Normal range of motion.  ?   Cervical back: Normal range of motion and neck supple.  ?Lymphadenopathy:  ?   Cervical: Cervical adenopathy present.  ?Skin: ?   General: Skin is warm and dry.  ?   Capillary Refill: Capillary refill takes less than 2 seconds.  ?Neurological:  ?   General: No focal deficit present.  ?   Mental Status: She is alert and oriented to person, place, and time.  ?Psychiatric:     ?   Mood and Affect: Mood normal.     ?   Behavior: Behavior normal.     ?   Thought Content: Thought content normal.     ?   Judgment: Judgment normal.  ?  ? ? Assessment & Plan  ?  ? ?1. Acute non-recurrent pansinusitis ?Start z-pack. Take as directed for 5 days. Rest and increase fluids. Continue using OTC medication to control symptoms.   ?- azithromycin (ZITHROMAX) 250 MG tablet; z-pack - take as directed for 5 days  Dispense: 6 tablet; Refill: 0 ? ?2. Acute  otitis media, unspecified otitis media type ?Add floxin ear drops. Use four drops in both ears twice daily for next 5 days.  ?- ofloxacin (FLOXIN OTIC) 0.3 % OTIC solution; Place 5 drops into both ears daily.  Dispense: 5 mL; Refill: 0 ? ?3. Moderate episode of recurrent major depressive disorder (HCC) ?Continue bupropion SR 100mg  daily. Refills provided today.  ?- buPROPion ER (WELLBUTRIN SR) 100 MG 12 hr tablet; Take 1 tablet (100 mg total) by mouth 2 (two) times daily.  Dispense: 30 tablet; Refill: 3 ? ?4. Tobacco abuse ?Conitnue bupropion SR daily. Refills provided today.  ?- buPROPion ER (WELLBUTRIN SR) 100 MG 12  hr tablet; Take 1 tablet (100 mg total) by mouth 2 (two) times daily.  Dispense: 30 tablet; Refill: 3 ? ?5. Primary insomnia ?May continue trazodone at bedtime as needed. Refills provided today.  ?- traZODone (DESYREL) 50 MG tablet; Take 0.5-1 tablets (25-50 mg total) by mouth at bedtime as needed for sleep.  Dispense: 30 tablet; Refill: 3  ? ?Return for prn worsening or persistent symptoms.  ?   ? ? ? ?Carlean Jews, NP  ?[facility] ?[phone] (phone) ?[phone] (fax) ? ?[facility] ?

## 2021-06-16 ENCOUNTER — Other Ambulatory Visit: Payer: Medicare HMO

## 2021-06-17 ENCOUNTER — Other Ambulatory Visit: Payer: Medicare HMO

## 2021-07-14 ENCOUNTER — Ambulatory Visit
Admission: RE | Admit: 2021-07-14 | Discharge: 2021-07-14 | Disposition: A | Discharge status: Home / Self Care | Payer: Medicare HMO | Source: Ambulatory Visit | Attending: Radiology | Admitting: Radiology

## 2021-07-14 DIAGNOSIS — N6452 Nipple discharge: Secondary | ICD-10-CM

## 2021-07-14 MED ORDER — GADOBUTROL 1 MMOL/ML IV SOLN
6.0000 mL | Freq: Once | INTRAVENOUS | Status: AC | PRN
  Administered 2021-07-14: 6 mL via INTRAVENOUS

## 2021-07-29 ENCOUNTER — Ambulatory Visit: Payer: Medicare HMO | Admitting: Cardiology

## 2021-08-04 ENCOUNTER — Other Ambulatory Visit: Payer: Self-pay | Admitting: Nurse Practitioner

## 2021-08-04 DIAGNOSIS — I10 Essential (primary) hypertension: Secondary | ICD-10-CM

## 2021-08-10 ENCOUNTER — Encounter: Payer: Self-pay | Admitting: Cardiology

## 2021-08-10 ENCOUNTER — Ambulatory Visit: Payer: Medicare HMO | Admitting: Cardiology

## 2021-08-10 VITALS — BP 119/67 | HR 62 | Temp 98.2°F | Resp 16 | Ht 63.0 in | Wt 134.0 lb

## 2021-08-10 DIAGNOSIS — I714 Abdominal aortic aneurysm, without rupture, unspecified: Secondary | ICD-10-CM

## 2021-08-10 DIAGNOSIS — E782 Mixed hyperlipidemia: Secondary | ICD-10-CM

## 2021-08-10 DIAGNOSIS — I6523 Occlusion and stenosis of bilateral carotid arteries: Secondary | ICD-10-CM

## 2021-08-10 DIAGNOSIS — F17200 Nicotine dependence, unspecified, uncomplicated: Secondary | ICD-10-CM

## 2021-08-10 DIAGNOSIS — Z951 Presence of aortocoronary bypass graft: Secondary | ICD-10-CM

## 2021-08-10 DIAGNOSIS — I2510 Atherosclerotic heart disease of native coronary artery without angina pectoris: Secondary | ICD-10-CM

## 2021-08-10 DIAGNOSIS — I10 Essential (primary) hypertension: Secondary | ICD-10-CM

## 2021-08-10 NOTE — Progress Notes (Signed)
? ?Regina Perry ?Date of Birth: 10/01/1960 ?MRN: [id number] ?Primary Care Provider:Boscia, Kathlynn Grate, NP ?Former Cardiology Providers: Altamese , APRN, FNP-C ?Primary Cardiologist: Tessa Lerner, DO, [facility] (established care 04/22/2019) ?Primary cardiothoracic surgery: Dr. Cliffton Asters ? ?Date: 08/10/21 ?Last Office Visit: 03/08/2021 ? ?Chief Complaint  ?Patient presents with  ? Follow-up  ?  17-month follow-up for carotid disease and CAD.  ? ? ?HPI  ?Regina Perry is a 61 y.o.  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  ? ?Patient was noted to have left main disease and a coronary CTA and subsequently underwent left heart catheterization which verify the findings and underwent two-vessel CABG in October 2020.  Since then she has been managed medically. ? ?Other cardiac comorbid conditions include carotid artery stenosis as well as abdominal aortic aneurysm which was noted incidentally.  Since the last office visit she was supposed to have a carotid duplex to follow carotid disease but this is still pending and she is currently taking care of both her father and husband who have been diagnosed with cancer.  She denies any symptoms to suggest disease progression. ? ?She is working on smoking cessation and had stopped for some time due to increased stress factors she is smoking few cigarettes per day. ? ?FUNCTIONAL STATUS: She walks 7 acres daily. ? ?ALLERGIES: ?Allergies  ?Allergen Reactions  ? Levofloxacin Nausea Only  ? Percocet [Oxycodone-Acetaminophen] Other (See Comments)  ?  Sick on stomach when taken with anesthesia   ? ?MEDICATION LIST PRIOR TO VISIT: ?Current Outpatient Medications on File Prior to Visit  ?Medication Sig Dispense  Refill  ? acetaminophen (TYLENOL) 500 MG tablet Take 1,000 mg by mouth every 6 (six) hours as needed for moderate pain or headache.    ? ALPRAZolam (XANAX) 0.5 MG tablet Take 0.5 mg by mouth in the morning and at bedtime.    ? buPROPion ER (WELLBUTRIN SR) 100 MG 12 hr tablet Take 1 tablet (100 mg total) by mouth 2 (two) times daily. 30 tablet 3  ? cetirizine (ZYRTEC) 10 MG tablet Take 10 mg by mouth at bedtime.    ? clopidogrel (PLAVIX) 75 MG tablet TAKE 1 TABLET EVERY DAY 90 tablet 3  ? escitalopram (LEXAPRO) 20 MG tablet Take 20 mg by mouth at bedtime.    ? gabapentin (NEURONTIN) 300 MG capsule Take 1 capsule (300 mg total) by mouth 3 (three) times daily. 90 capsule 4  ? HYDROcodone-acetaminophen (NORCO) 10-325 MG tablet Take 1 tablet by mouth every 6 (six) hours as needed for moderate pain.    ? losartan (COZAAR) 25 MG tablet TAKE 1 TABLET EVERY EVENING 90 tablet 1  ? metoprolol succinate (TOPROL-XL) 25 MG 24 hr tablet TAKE 1 TABLET EVERY DAY 90 tablet 0  ? nitroGLYCERIN (NITROSTAT) 0.4 MG SL tablet Place 0.4 mg under the tongue every 5 (five) minutes as needed for chest pain.    ? pantoprazole (PROTONIX) 40 MG tablet TAKE 1 TABLET EVERY DAY 90 tablet 1  ? promethazine (PHENERGAN) 25 MG tablet Take 25 mg every 6 (six) hours as needed by mouth for nausea or vomiting.    ? rosuvastatin (CRESTOR) 40 MG tablet TAKE 1 TABLET (40 MG TOTAL) BY MOUTH DAILY. 90 tablet 3  ? SUMAtriptan (IMITREX) 100 MG tablet Take 100 mg by mouth every 2 (  two) hours as needed for migraine or headache.    ? traZODone (DESYREL) 50 MG tablet Take 0.5-1 tablets (25-50 mg total) by mouth at bedtime as needed for sleep. 30 tablet 3  ? vitamin B-12 (CYANOCOBALAMIN) 500 MCG tablet Take 500 mcg by mouth daily.    ? ?No current facility-administered medications on file prior to visit.  ? ? ?PAST MEDICAL HISTORY: ?Past Medical History:  ?Diagnosis Date  ? Allergic rhinitis   ? Anxiety   ? Bronchitis   ? Depression   ? Diverticulitis   ? GERD  (gastroesophageal reflux disease)   ? H/O cesarean section   ? History of hiatal hernia   ? History of kidney stones   ? LEFT KIDNEY 2 STONES JUST WATCHING( [facility])  ? Hypercholesteremia   ? Hyperplastic colon polyp   ? 12/30/2008  ? Hypertension   ? Migraine   ? Palpitations   ? 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 Yates Decamp), prescribed verapamil  ? Pleurisy   ? Pneumonia   ? PONV (postoperative nausea and vomiting)   ? 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  ? CORONARY ARTER(CABG) X2  LIMA to LAD and SVG TO OM1 01/09/19   ? Vitamin D deficiency   ? ? ?PAST SURGICAL HISTORY: ?Past Surgical History:  ?Procedure Laterality Date  ? ABDOMINAL HYSTERECTOMY    ? partial  ? BREAST BIOPSY    ? CESAREAN SECTION    ? COLONOSCOPY    ? CORONARY ARTERY BYPASS GRAFT N/A 01/09/2019  ? Procedure: CORONARY ARTERY BYPASS GRAFTING (CABG) X2 USING LEFT INTERNAL MAMMARY ARTERY TO CIRC AND RIGHT GREATER SAPHENOUS VEIN TO LAD.;  Surgeon: Corliss Skains, MD;  Location: [facility] OR;  Service: Open Heart Surgery;  Laterality: N/A;  ? CORONARY ARTERY BYPASS GRAFT    ? ETHMOIDECTOMY Bilateral 06/26/2014  ? Procedure: BILATERAL ANTERIOR ETHMOIDECTOMY;  Surgeon: Drema Halon, MD;  Location: [facility];  Service: ENT;  Laterality: Bilateral;  ? LEFT HEART CATH AND CORONARY ANGIOGRAPHY N/A 01/08/2019  ? Procedure: LEFT HEART CATH AND CORONARY ANGIOGRAPHY;  Surgeon: Elder Negus, MD;  Location: [facility];  Service: Cardiovascular;  Laterality: N/A;  ? MAXILLARY ANTROSTOMY Bilateral 06/26/2014  ? Procedure: BILATERAL MAXILLARY OSTEA ENLARGEMENT;  Surgeon: Drema Halon, MD;  Location: [facility];  Service: ENT;  Laterality: Bilateral;  ? NASAL SEPTOPLASTY W/ TURBINOPLASTY Bilateral 06/26/2014  ? Procedure: BILATERAL NASAL SEPTOPLASTY WITH TURBINATE REDUCTION;  Surgeon: Drema Halon, MD;  Location:  SURGERY  CENTER;  Service: ENT;  Laterality: Bilateral;  ? SHOULDER ARTHROSCOPY W/ ROTATOR CUFF REPAIR  08/2013  ? right  ? TEE WITHOUT CARDIOVERSION  01/09/2019  ? Procedure: Transesophageal Echocardiogram (Tee);  Surgeon: Corliss Skains, MD;  Location: [facility];  Service: Open Heart Surgery;;  ? TONSILLECTOMY AND ADENOIDECTOMY    ? TUBAL LIGATION    ? ? ?FAMILY HISTORY: ?The patient's family history includes AAA (abdominal aortic aneurysm) in her sister; Aneurysm in her mother; Breast cancer in her paternal grandmother; Cerebral aneurysm in her father; Heart disease in her mother; Irritable bowel syndrome in her mother; Lung cancer in her paternal grandfather; Migraines in her mother. ?  ?SOCIAL HISTORY:  ?The patient  reports that she has been smoking cigarettes. She started smoking about 2 years ago. She has a 8.75 pack-year smoking history. She has never used smokeless tobacco. She reports that she  does not drink alcohol and does not use drugs. ? ?Review of Systems  ?Cardiovascular:  Negative for chest pain, claudication, dyspnea on exertion, leg swelling, near-syncope, orthopnea, palpitations, paroxysmal nocturnal dyspnea and syncope.  ?Respiratory:  Negative for shortness of breath.   ? ?PHYSICAL EXAM: ? ?  08/10/2021  ?  2:52 PM 05/26/2021  ? 10:32 AM 04/20/2021  ?  1:58 PM  ?Vitals with BMI  ?Height 5\' 3"  5\' 3"  5\' 3"   ?Weight 134 lbs 134 lbs 6 oz 134 lbs 11 oz  ?BMI 23.74 23.81 23.87  ?Systolic 119 97 107  ?Diastolic 67 63 56  ?Pulse 62 53 50  ? ?CONSTITUTIONAL: Well-developed and well-nourished. No acute distress.  ?SKIN: Skin is warm and dry. No rash noted. No cyanosis. No pallor. No jaundice ?HEAD: Normocephalic and atraumatic.  ?EYES: No scleral icterus ?MOUTH/THROAT: Moist oral membranes.  ?NECK: No JVD present. No thyromegaly noted. No carotid bruits  ?CHEST Normal respiratory effort. No intercostal retractions  ?LUNGS: Clear to auscultation bilaterally.  No stridor. No wheezes. No rales.  ?CARDIOVASCULAR:  Regular rhythm, normal S1-S2, no murmurs rubs or gallops appreciated. ?ABDOMINAL: Nonobese, soft, nontender, nondistended, positive bowel sounds in all 4 quadrants, no abdominal bruits, no apparent ascites.  ?EXTREM

## 2021-08-26 ENCOUNTER — Ambulatory Visit (INDEPENDENT_AMBULATORY_CARE_PROVIDER_SITE_OTHER): Payer: Medicare HMO | Admitting: Nurse Practitioner

## 2021-08-26 ENCOUNTER — Encounter: Payer: Self-pay | Admitting: Nurse Practitioner

## 2021-08-26 ENCOUNTER — Other Ambulatory Visit: Payer: Self-pay | Admitting: Nurse Practitioner

## 2021-08-26 VITALS — BP 108/64 | HR 56 | Temp 97.5°F | Ht 62.99 in | Wt 135.8 lb

## 2021-08-26 DIAGNOSIS — F331 Major depressive disorder, recurrent, moderate: Secondary | ICD-10-CM

## 2021-08-26 DIAGNOSIS — N390 Urinary tract infection, site not specified: Secondary | ICD-10-CM | POA: Diagnosis not present

## 2021-08-26 DIAGNOSIS — Z79899 Other long term (current) drug therapy: Secondary | ICD-10-CM

## 2021-08-26 DIAGNOSIS — M5137 Other intervertebral disc degeneration, lumbosacral region: Secondary | ICD-10-CM

## 2021-08-26 DIAGNOSIS — R319 Hematuria, unspecified: Secondary | ICD-10-CM

## 2021-08-26 DIAGNOSIS — M51379 Other intervertebral disc degeneration, lumbosacral region without mention of lumbar back pain or lower extremity pain: Secondary | ICD-10-CM

## 2021-08-26 DIAGNOSIS — R3989 Other symptoms and signs involving the genitourinary system: Secondary | ICD-10-CM | POA: Diagnosis not present

## 2021-08-26 DIAGNOSIS — G2581 Restless legs syndrome: Secondary | ICD-10-CM

## 2021-08-26 DIAGNOSIS — M5432 Sciatica, left side: Secondary | ICD-10-CM

## 2021-08-26 DIAGNOSIS — M5431 Sciatica, right side: Secondary | ICD-10-CM

## 2021-08-26 DIAGNOSIS — Z720 Tobacco use: Secondary | ICD-10-CM

## 2021-08-26 DIAGNOSIS — I25810 Atherosclerosis of coronary artery bypass graft(s) without angina pectoris: Secondary | ICD-10-CM

## 2021-08-26 LAB — POCT URINALYSIS DIP (CLINITEK)
Bilirubin, UA: NEGATIVE
Glucose, UA: NEGATIVE mg/dL
Ketones, POC UA: NEGATIVE mg/dL
Leukocytes, UA: NEGATIVE
Nitrite, UA: NEGATIVE
POC PROTEIN,UA: 30 — AB
Spec Grav, UA: <=1.005 — AB (ref 1.010–1.025)
Urobilinogen, UA: 0.2 E.U./dL
pH, UA: 6 (ref 5.0–8.0)

## 2021-08-26 MED ORDER — ROPINIROLE HCL 0.5 MG PO TABS: 0.5000 mg | ORAL_TABLET | Freq: Every day | ORAL | 2 refills | Status: DC

## 2021-08-26 MED ORDER — NITROFURANTOIN MONOHYD MACRO 100 MG PO CAPS: 100.0000 mg | ORAL_CAPSULE | Freq: Two times a day (BID) | ORAL | 0 refills | Status: DC

## 2021-08-26 NOTE — Progress Notes (Signed)
Established patient visit   Patient: Regina Perry   DOB: 1960/11/20   61 y.o. Female  MRN: [id number] Visit Date: 08/26/2021   Chief Complaint  Patient presents with   Referral   Subjective    HPI  Patient presents for follow up visit.  -needs referral to pain management. Taking high dose hydrocodone/APAP four times daily. Has been for long time. Provider who was giving her this medication has recently retired. She has had surgical repair of multi level degenerative discs in lumbar spine.  -bladder pain which started about 3 days ago. Has noted blood in her urine at times. Has increased water intake. She has not been able to see blood in the urine, but has felt lower abdominal pain/pressure in the bladder area after she finishes the urine stream.  -has been having leg cramps in her feel and legs. This is waking her up in the middle of the night. Has been going on for the past couple of weeks.  -has history of double bypass surgery. Did take veins out of her left leg to bypass coronary arteries.  -has carotid stenosis and abdominal aortic aneurysm.  -takes crestor 40mg  daily.    Medications: Outpatient Medications Prior to Visit  Medication Sig   acetaminophen (TYLENOL) 500 MG tablet Take 1,000 mg by mouth every 6 (six) hours as needed for moderate pain or headache.   ALPRAZolam (XANAX) 0.5 MG tablet Take 0.5 mg by mouth in the morning and at bedtime.   cetirizine (ZYRTEC) 10 MG tablet Take 10 mg by mouth at bedtime.   clopidogrel (PLAVIX) 75 MG tablet TAKE 1 TABLET EVERY DAY   escitalopram (LEXAPRO) 20 MG tablet Take 20 mg by mouth at bedtime.   gabapentin (NEURONTIN) 300 MG capsule Take 1 capsule (300 mg total) by mouth 3 (three) times daily.   HYDROcodone-acetaminophen (NORCO) 10-325 MG tablet Take 1 tablet by mouth every 6 (six) hours as needed for moderate pain.   losartan (COZAAR) 25 MG tablet TAKE 1 TABLET EVERY EVENING   metoprolol succinate (TOPROL-XL) 25 MG 24 hr tablet TAKE  1 TABLET EVERY DAY   nitroGLYCERIN (NITROSTAT) 0.4 MG SL tablet Place 0.4 mg under the tongue every 5 (five) minutes as needed for chest pain.   pantoprazole (PROTONIX) 40 MG tablet TAKE 1 TABLET EVERY DAY   promethazine (PHENERGAN) 25 MG tablet Take 25 mg every 6 (six) hours as needed by mouth for nausea or vomiting.   SUMAtriptan (IMITREX) 100 MG tablet Take 100 mg by mouth every 2 (two) hours as needed for migraine or headache.   traZODone (DESYREL) 50 MG tablet Take 0.5-1 tablets (25-50 mg total) by mouth at bedtime as needed for sleep.   vitamin B-12 (CYANOCOBALAMIN) 500 MCG tablet Take 500 mcg by mouth daily.   [DISCONTINUED] buPROPion ER (WELLBUTRIN SR) 100 MG 12 hr tablet Take 1 tablet (100 mg total) by mouth 2 (two) times daily.   rosuvastatin (CRESTOR) 40 MG tablet TAKE 1 TABLET (40 MG TOTAL) BY MOUTH DAILY.   No facility-administered medications prior to visit.    Review of Systems  Constitutional:  Positive for fatigue. Negative for activity change, appetite change, chills and fever.  HENT:  Negative for congestion, postnasal drip, rhinorrhea, sinus pressure, sinus pain, sneezing and sore throat.   Eyes: Negative.   Respiratory:  Negative for cough, chest tightness, shortness of breath and wheezing.   Cardiovascular:  Negative for chest pain and palpitations.  Gastrointestinal:  Negative for abdominal pain, constipation, diarrhea,  nausea and vomiting.  Endocrine: Negative for cold intolerance, heat intolerance, polydipsia and polyuria.  Genitourinary:  Positive for dysuria, flank pain, frequency and hematuria. Negative for dyspareunia and urgency.  Musculoskeletal:  Positive for arthralgias, back pain and myalgias.       Leg cramps and leg twitching, most severe at night.   Skin:  Negative for rash.  Allergic/Immunologic: Negative for environmental allergies.  Neurological:  Negative for dizziness, weakness and headaches.  Hematological:  Negative for adenopathy.   Psychiatric/Behavioral:  Positive for sleep disturbance. The patient is nervous/anxious.      Objective     Today's Vitals   08/26/21 1505  BP: 108/64  Pulse: (!) 56  Temp: (!) 97.5 F (36.4 C)  SpO2: 95%  Weight: 135 lb 12.8 oz (61.6 kg)  Height: 5' 2.99" (1.6 m)   Body mass index is 24.06 kg/m.   BP Readings from Last 3 Encounters:  08/26/21 108/64  08/10/21 119/67  05/26/21 97/63    Wt Readings from Last 3 Encounters:  08/26/21 135 lb 12.8 oz (61.6 kg)  08/10/21 134 lb (60.8 kg)  05/26/21 134 lb 6.4 oz (61 kg)    Physical Exam Vitals and nursing note reviewed.  Constitutional:      Appearance: Normal appearance. She is well-developed.  HENT:     Head: Normocephalic and atraumatic.  Eyes:     Pupils: Pupils are equal, round, and reactive to light.  Cardiovascular:     Rate and Rhythm: Normal rate and regular rhythm.     Pulses: Normal pulses.     Heart sounds: Normal heart sounds.  Pulmonary:     Effort: Pulmonary effort is normal.     Breath sounds: Normal breath sounds.  Abdominal:     Palpations: Abdomen is soft.  Genitourinary:    Comments: Urine sample positive for small blood and small protein.  Musculoskeletal:        General: Normal range of motion.     Cervical back: Normal range of motion and neck supple.     Comments: Moderate lowerr back pain which is worse with bending and twisting at the waist.   Lymphadenopathy:     Cervical: No cervical adenopathy.  Skin:    General: Skin is warm and dry.     Capillary Refill: Capillary refill takes less than 2 seconds.  Neurological:     General: No focal deficit present.     Mental Status: She is alert and oriented to person, place, and time.  Psychiatric:        Mood and Affect: Mood normal.        Behavior: Behavior normal.        Thought Content: Thought content normal.        Judgment: Judgment normal.     Results for orders placed or performed in visit on 08/26/21  Urine Culture    Specimen: Urine   Urine  Result Value Ref Range   Urine Culture, Routine Final report    Organism ID, Bacteria Comment   POCT URINALYSIS DIP (CLINITEK)  Result Value Ref Range   Color, UA yellow yellow   Clarity, UA clear clear   Glucose, UA negative negative mg/dL   Bilirubin, UA negative negative   Ketones, POC UA negative negative mg/dL   Spec Grav, UA <=1.610 (A) 1.010 - 1.025   Blood, UA small (A) negative   pH, UA 6.0 5.0 - 8.0   POC PROTEIN,UA =30 (A) negative, trace   Urobilinogen, UA  0.2 0.2 or 1.0 E.U./dL   Nitrite, UA Negative Negative   Leukocytes, UA Negative Negative    Assessment & Plan    1. Bladder pain Urine sample positive for small blood and protein. Treat for infection with macrobid bid for 5 days. Send for culture and sensitivity and adjust medication as indicated.  - POCT URINALYSIS DIP (CLINITEK) - Urine Culture; Future - Urine Culture  2. Urinary tract infection with hematuria, site unspecified Start macrobid 100mg  twice daily for 5 days. Send urine for culture and sensitivity and adjust medication as indicated  - nitrofurantoin, macrocrystal-monohydrate, (MACROBID) 100 MG capsule; Take 1 capsule (100 mg total) by mouth 2 (two) times daily.  Dispense: 10 capsule; Refill: 0  3. Restless leg syndrome Start Requip 0.5 mg tablets.  Reassess at next visit. Patient to notify the office if this dose Requip is ineffective.  Will increase to 1 mg 3 indicated. - rOPINIRole (REQUIP) 0.5 MG tablet; Take 1 tablet (0.5 mg total) by mouth at bedtime.  Dispense: 30 tablet; Refill: 2  4. DDD (degenerative disc disease), lumbosacral Patient with extensive degenerative disc disease patient in lumbar spine.  Multiple surgeries in past. Taking high dose narcotics to manage residual pain. Refer to pain clinic for further management and treatment.  - Ambulatory referral to Pain Clinic  5. Bilateral sciatica Patient with extensive degenerative disc disease patient in lumbar  spine.  Multiple surgeries in past. Taking high dose narcotics to manage residual pain. Refer to pain clinic for further management and treatment. - Ambulatory referral to Pain Clinic  6. High risk medication use Patient with extensive degenerative disc disease patient in lumbar spine.  Multiple surgeries in past. Taking high dose narcotics to manage residual pain. Refer to pain clinic for further management and treatment. - Ambulatory referral to Pain Clinic  7. Coronary artery disease involving coronary bypass graft of native heart without angina pectoris Patient to continue following up with cardiology as scheduled.   Problem List Items Addressed This Visit       Cardiovascular and Mediastinum   Coronary artery disease involving coronary bypass graft of native heart without angina pectoris     Nervous and Auditory   Bilateral sciatica   Relevant Medications   rOPINIRole (REQUIP) 0.5 MG tablet   Other Relevant Orders   Ambulatory referral to Pain Clinic     Musculoskeletal and Integument   DDD (degenerative disc disease), lumbosacral   Relevant Orders   Ambulatory referral to Pain Clinic     Genitourinary   Urinary tract infection with hematuria   Relevant Medications   nitrofurantoin, macrocrystal-monohydrate, (MACROBID) 100 MG capsule     Other   Bladder pain - Primary   Relevant Orders   POCT URINALYSIS DIP (CLINITEK) (Completed)   Urine Culture (Completed)   Restless leg syndrome   Relevant Medications   rOPINIRole (REQUIP) 0.5 MG tablet   High risk medication use   Relevant Orders   Ambulatory referral to Pain Clinic     Return in about 2 months (around 10/26/2021) for restless legs.         12/26/2021, NP  [facility] [phone] (phone) [phone] (fax)  [facility]

## 2021-08-28 LAB — URINE CULTURE

## 2021-08-29 DIAGNOSIS — N390 Urinary tract infection, site not specified: Secondary | ICD-10-CM | POA: Insufficient documentation

## 2021-08-29 DIAGNOSIS — G2581 Restless legs syndrome: Secondary | ICD-10-CM | POA: Insufficient documentation

## 2021-08-29 DIAGNOSIS — R3989 Other symptoms and signs involving the genitourinary system: Secondary | ICD-10-CM | POA: Insufficient documentation

## 2021-08-29 DIAGNOSIS — Z79899 Other long term (current) drug therapy: Secondary | ICD-10-CM | POA: Insufficient documentation

## 2021-08-29 DIAGNOSIS — M5137 Other intervertebral disc degeneration, lumbosacral region: Secondary | ICD-10-CM | POA: Insufficient documentation

## 2021-08-29 NOTE — Progress Notes (Signed)
Patient was started on macrobid at the time of her visit.

## 2021-08-31 ENCOUNTER — Ambulatory Visit: Payer: Medicare HMO

## 2021-08-31 ENCOUNTER — Telehealth: Payer: Self-pay | Admitting: Nurse Practitioner

## 2021-08-31 ENCOUNTER — Other Ambulatory Visit: Payer: Self-pay

## 2021-08-31 ENCOUNTER — Other Ambulatory Visit: Payer: Self-pay | Admitting: Nurse Practitioner

## 2021-08-31 DIAGNOSIS — I714 Abdominal aortic aneurysm, without rupture, unspecified: Secondary | ICD-10-CM

## 2021-08-31 DIAGNOSIS — R319 Hematuria, unspecified: Secondary | ICD-10-CM

## 2021-08-31 DIAGNOSIS — I6523 Occlusion and stenosis of bilateral carotid arteries: Secondary | ICD-10-CM

## 2021-08-31 NOTE — Progress Notes (Signed)
New referral to urology, Dr. Vanna Scotland, per patient request

## 2021-08-31 NOTE — Telephone Encounter (Signed)
A referral was placed for Urologist

## 2021-08-31 NOTE — Telephone Encounter (Signed)
Patient called and stated she has been taking antibiotics since Friday from UTI and she is still peeing blood with abdominal pain. She is asking what to do or do you need to refer her to a urologist? Please advise.

## 2021-09-07 NOTE — Progress Notes (Incomplete)
09/08/21 9:09 AM   Roland Earl 1960-05-26 [id number]  Referring provider:  Ronnell Freshwater, NP [place],  [place] [address] No chief complaint on file.     HPI: Regina Perry is a 61 y.o.female who presents today for further evaluation of UTI with hematuria.   She was seen by her PCP Leretha Pol, NP, on 08/26/2021. She was noted to have ongoing bladder pain and blood in urine with abdominal pain/pressure in the bladder area after she finished urinating. UA showed small blood otherwise unremarkable, urine culture showed no growth  She was started on Macrobid.   She requested urologic follow-up.  No documented history of recurrent urinary tract infections.  She does have microscopic blood in her urine today, 3-10 red blood cells per high-power field.  She does have a personal history of kidney stones, previously followed by [facility].  No recent upper tract imaging.  She continues to smoke, half a pack a day.  She smoked as much is 1 pack/day since she was a teenager.  This has been off and on.  She reports today that she has multiple concerning issues.  In the morning time, her urine is darker and she has what she thinks is a drop of blood.  Throughout the day, her urine clears.  She denies any clots.  She also feels like she has to lean forward and straining at times when she urinates.  She also feels like when she is done urinating, she has crampy abdominal pain in her lower abdomen that slowly resolves.  She wonders if it is because she is straining so much to complete her urination.    She is status post hysterectomy for dysfunctional uterine bleeding remotely.  She denies any other vaginal symptoms.    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   CORONARY ARTER(CABG) X2  LIMA to LAD and SVG TO OM1 01/09/19    Vitamin D deficiency     Surgical History: Past Surgical History:  Procedure Laterality Date   ABDOMINAL HYSTERECTOMY     partial   BREAST BIOPSY     CESAREAN SECTION     COLONOSCOPY     CORONARY ARTERY BYPASS GRAFT N/A 01/09/2019   Procedure: CORONARY ARTERY BYPASS GRAFTING (CABG) X2 USING LEFT INTERNAL MAMMARY ARTERY TO CIRC AND RIGHT GREATER SAPHENOUS VEIN TO LAD.;  Surgeon: Lajuana Matte, MD;  Location: [facility];  Service: Open Heart Surgery;  Laterality: N/A;   CORONARY ARTERY BYPASS GRAFT     ETHMOIDECTOMY Bilateral 06/26/2014   Procedure: BILATERAL ANTERIOR ETHMOIDECTOMY;  Surgeon: Rozetta Nunnery, MD;  Location: [facility];  Service: ENT;  Laterality: Bilateral;   LEFT HEART CATH  AND CORONARY ANGIOGRAPHY N/A 01/08/2019   Procedure: LEFT HEART CATH AND CORONARY ANGIOGRAPHY;  Surgeon: Nigel Mormon, MD;  Location: [facility];  Service: Cardiovascular;  Laterality: N/A;   MAXILLARY ANTROSTOMY Bilateral 06/26/2014   Procedure: BILATERAL MAXILLARY OSTEA ENLARGEMENT;  Surgeon: Rozetta Nunnery, MD;  Location: [facility];  Service: ENT;  Laterality: Bilateral;   NASAL SEPTOPLASTY W/ TURBINOPLASTY Bilateral 06/26/2014   Procedure: BILATERAL NASAL SEPTOPLASTY WITH TURBINATE REDUCTION;  Surgeon: Rozetta Nunnery, MD;  Location: [facility];  Service: ENT;  Laterality: Bilateral;   SHOULDER ARTHROSCOPY W/ ROTATOR CUFF REPAIR  08/2013   right    TEE WITHOUT CARDIOVERSION  01/09/2019   Procedure: Transesophageal Echocardiogram (Tee);  Surgeon: Lajuana Matte, MD;  Location: [facility];  Service: Open Heart Surgery;;   TONSILLECTOMY AND ADENOIDECTOMY     TUBAL LIGATION      Home Medications:  Allergies as of 09/08/2021       Reactions   Levofloxacin Nausea Only   Percocet [oxycodone-acetaminophen] Other (See Comments)   Sick on stomach when taken with anesthesia         Medication List        Accurate as of September 08, 2021  9:09 AM. If you have any questions, ask your nurse or doctor.          acetaminophen 500 MG tablet Commonly known as: TYLENOL Take 1,000 mg by mouth every 6 (six) hours as needed for moderate pain or headache.   ALPRAZolam 0.5 MG tablet Commonly known as: XANAX Take 0.5 mg by mouth in the morning and at bedtime.   buPROPion ER 100 MG 12 hr tablet Commonly known as: WELLBUTRIN SR TAKE 1 TABLET BY MOUTH 2 TIMES DAILY.   cetirizine 10 MG tablet Commonly known as: ZYRTEC Take 10 mg by mouth at bedtime.   clopidogrel 75 MG tablet Commonly known as: PLAVIX TAKE 1 TABLET EVERY DAY   escitalopram 20 MG tablet Commonly known as: LEXAPRO Take 20 mg by mouth at bedtime.   gabapentin 300 MG capsule Commonly known as: NEURONTIN Take 1 capsule (300 mg total) by mouth 3 (three) times daily.   HYDROcodone-acetaminophen 10-325 MG tablet Commonly known as: NORCO Take 1 tablet by mouth every 6 (six) hours as needed for moderate pain.   losartan 25 MG tablet Commonly known as: COZAAR TAKE 1 TABLET EVERY EVENING   metoprolol succinate 25 MG 24 hr tablet Commonly known as: TOPROL-XL TAKE 1 TABLET EVERY DAY   nitrofurantoin (macrocrystal-monohydrate) 100 MG capsule Commonly known as: MACROBID Take 1 capsule (100 mg total) by mouth 2 (two) times daily.   nitroGLYCERIN 0.4 MG SL tablet Commonly known as: NITROSTAT Place 0.4 mg under the tongue every 5 (five) minutes as needed for chest pain.    pantoprazole 40 MG tablet Commonly known as: PROTONIX TAKE 1 TABLET EVERY DAY   promethazine 25 MG tablet Commonly known as: PHENERGAN Take 25 mg every 6 (six) hours as needed by mouth for nausea or vomiting.   rOPINIRole 0.5 MG tablet Commonly known as: REQUIP Take 1 tablet (0.5 mg total) by mouth at bedtime.   rosuvastatin 40 MG tablet Commonly known as: CRESTOR TAKE 1 TABLET (40 MG TOTAL) BY MOUTH DAILY.   SUMAtriptan 100 MG tablet Commonly known as: IMITREX Take 100 mg by mouth every 2 (two) hours as needed for migraine or headache.   traZODone 50 MG tablet Commonly known as: DESYREL Take 0.5-1 tablets (25-50 mg  total) by mouth at bedtime as needed for sleep.   vitamin B-12 500 MCG tablet Commonly known as: CYANOCOBALAMIN Take 500 mcg by mouth daily.        Allergies:  Allergies  Allergen Reactions   Levofloxacin Nausea Only   Percocet [Oxycodone-Acetaminophen] Other (See Comments)    Sick on stomach when taken with anesthesia     Family History: Family History  Problem Relation Age of Onset   Heart disease Mother        MI   Irritable bowel syndrome Mother    Migraines Mother    Aneurysm Mother        brain   Cerebral aneurysm Father    AAA (abdominal aortic aneurysm) Sister    Breast cancer Paternal Grandmother    Lung cancer Paternal Grandfather    Colon cancer Neg Hx    Esophageal cancer Neg Hx    Rectal cancer Neg Hx    Stomach cancer Neg Hx     Social History:  reports that she has been smoking cigarettes. She started smoking about 2 years ago. She has a 8.75 pack-year smoking history. She has never used smokeless tobacco. She reports that she does not drink alcohol and does not use drugs.   Physical Exam: There were no vitals taken for this visit.  Constitutional:  Alert and oriented, No acute distress. HEENT: [place] AT, moist mucus membranes.  Trachea midline, no masses. Cardiovascular: No clubbing, cyanosis, or edema. Respiratory: Normal  respiratory effort, no increased work of breathing. Skin: No rashes, bruises or suspicious lesions. Neurologic: Grossly intact, no focal deficits, moving all 4 extremities. Psychiatric: Normal mood and affect.  Laboratory Data:  Lab Results  Component Value Date   CREATININE 1.26 (H) 03/31/2021   Lab Results  Component Value Date   HGBA1C 5.7 (H) 03/31/2021    Urinalysis 3-10 red blood cells per high-powered field   Assessment & Plan:    1. Gross hematuria High risk hematuria and smoker  Recommended to complete hematuria evaluation including CT urogram and cystoscopy.  She is agreeable this plan.  Strongly encourage smoking cessation - Urinalysis, Complete - CT HEMATURIA WORKUP; Future  2. Urinary straining Likely related to possible cystocele, will evaluate with pelvic exam at the time of cystoscopy  We discussed techniques including leaning forward and Crede technique which she is already engaging in which is helpful.  She is not interested in pessary or any other intervention if she does have a significant cystocele.  3. History of kidney stones We will evaluate with the above   Follow-up CT/cystoscopy  Conley Rolls as a scribe for Hollice Espy, MD.,have documented all relevant documentation on the behalf of Hollice Espy, MD,as directed by  Hollice Espy, MD while in the presence of Hollice Espy, MD.  I have reviewed the above documentation for accuracy and completeness, and I agree with the above.   Hollice Espy, MD   [facility] [address], [place], [place] [address] [phone]

## 2021-09-08 ENCOUNTER — Ambulatory Visit: Payer: Medicare HMO | Admitting: Urology

## 2021-09-08 VITALS — BP 153/80 | HR 55 | Ht 62.5 in | Wt 136.0 lb

## 2021-09-08 DIAGNOSIS — R310 Gross hematuria: Secondary | ICD-10-CM | POA: Diagnosis not present

## 2021-09-08 DIAGNOSIS — R3916 Straining to void: Secondary | ICD-10-CM

## 2021-09-08 DIAGNOSIS — Z87442 Personal history of urinary calculi: Secondary | ICD-10-CM

## 2021-09-08 LAB — URINALYSIS, COMPLETE
Bilirubin, UA: NEGATIVE
Glucose, UA: NEGATIVE
Ketones, UA: NEGATIVE
Leukocytes,UA: NEGATIVE
Nitrite, UA: NEGATIVE
Protein,UA: NEGATIVE
Specific Gravity, UA: 1.015 (ref 1.005–1.030)
Urobilinogen, Ur: 0.2 mg/dL (ref 0.2–1.0)
pH, UA: 6.5 (ref 5.0–7.5)

## 2021-09-08 LAB — MICROSCOPIC EXAMINATION

## 2021-09-08 NOTE — Patient Instructions (Signed)

## 2021-09-22 ENCOUNTER — Ambulatory Visit
Admission: RE | Admit: 2021-09-22 | Discharge: 2021-09-22 | Disposition: A | Discharge status: Home / Self Care | Payer: Medicare HMO | Source: Ambulatory Visit | Attending: Urology | Admitting: Urology

## 2021-09-22 DIAGNOSIS — R310 Gross hematuria: Secondary | ICD-10-CM | POA: Diagnosis present

## 2021-09-22 LAB — POCT I-STAT CREATININE: Creatinine, Ser: 1.1 mg/dL — ABNORMAL HIGH (ref 0.44–1.00)

## 2021-09-22 MED ORDER — IOHEXOL 300 MG/ML  SOLN
125.0000 mL | Freq: Once | INTRAMUSCULAR | Status: AC | PRN
  Administered 2021-09-22: 150 mL via INTRAVENOUS

## 2021-09-28 ENCOUNTER — Other Ambulatory Visit: Payer: Self-pay | Admitting: Cardiology

## 2021-09-28 DIAGNOSIS — I6523 Occlusion and stenosis of bilateral carotid arteries: Secondary | ICD-10-CM

## 2021-10-05 ENCOUNTER — Other Ambulatory Visit: Payer: Medicare HMO | Admitting: Urology

## 2021-10-05 NOTE — Progress Notes (Incomplete)
   10/05/21  CC: No chief complaint on file.    HPI: Regina Perry is a 61 y.o.female with a personal history of gross hematuria, urinary straining, and history of kidney stones.   She was seen by her PCP Vincent Gros, NP, on 08/26/2021. She was noted to have ongoing bladder pain and blood in urine with abdominal pain/pressure in the bladder area after she finished urinating. UA showed small blood otherwise unremarkable, urine culture showed no growth  She was started on Macrobid.   She underwent a CTU on 09/22/2021 that visualized a 4 mm left renal calculus. No evidence of ureteral calculi or hydronephrosis.      There were no vitals filed for this visit. NED. A&Ox3.   No respiratory distress   Abd soft, NT, ND Normal external genitalia with patent urethral meatus  Cystoscopy Procedure Note  Patient identification was confirmed, informed consent was obtained, and patient was prepped using Betadine solution.  Lidocaine jelly was administered per urethral meatus.    Procedure: - Flexible cystoscope introduced, without any difficulty.   - Thorough search of the bladder revealed:    normal urethral meatus    normal urothelium    no stones    no ulcers     no tumors    no urethral polyps    no trabeculation  - Ureteral orifices were normal in position and appearance.  Post-Procedure: - Patient tolerated the procedure well  Assessment/ Plan:    No follow-ups on file.  I,Kailey Littlejohn,acting as a Neurosurgeon for Vanna Scotland, MD.,have documented all relevant documentation on the behalf of Vanna Scotland, MD,as directed by  Vanna Scotland, MD while in the presence of Vanna Scotland, MD.

## 2021-10-12 ENCOUNTER — Ambulatory Visit (INDEPENDENT_AMBULATORY_CARE_PROVIDER_SITE_OTHER): Payer: Medicare PPO | Admitting: Urology

## 2021-10-12 VITALS — BP 145/82 | HR 53 | Ht 62.5 in | Wt 136.0 lb

## 2021-10-12 DIAGNOSIS — R310 Gross hematuria: Secondary | ICD-10-CM | POA: Diagnosis not present

## 2021-10-12 DIAGNOSIS — N200 Calculus of kidney: Secondary | ICD-10-CM | POA: Diagnosis not present

## 2021-10-12 DIAGNOSIS — Z87442 Personal history of urinary calculi: Secondary | ICD-10-CM

## 2021-10-12 LAB — URINALYSIS, COMPLETE
Bilirubin, UA: NEGATIVE
Glucose, UA: NEGATIVE
Ketones, UA: NEGATIVE
Leukocytes,UA: NEGATIVE
Nitrite, UA: NEGATIVE
Protein,UA: NEGATIVE
Specific Gravity, UA: 1.01 (ref 1.005–1.030)
Urobilinogen, Ur: 0.2 mg/dL (ref 0.2–1.0)
pH, UA: 6 (ref 5.0–7.5)

## 2021-10-12 LAB — MICROSCOPIC EXAMINATION: Epithelial Cells (non renal): >10 /hpf — AB (ref 0–10)

## 2021-10-12 NOTE — Progress Notes (Signed)
   10/12/21  CC:  Chief Complaint  Patient presents with   Cysto     HPI: Regina Perry is a 61 y.o.female with a personal history of kidney stones, urinary straining, and gross hematuria  who present today for diagnostic cystoscopy with CT results.    She underwent CTU on 09/22/2021. It visualized a   She continues to smoke, half a pack a day. She smoked as much is 1 pack/day since she was a teenager.  This has been off and on. 4 mm calculus noted in lower pole of left kidney. No evidence of ureteral calculi or hydronephrosis. No renal masses identified. No masses seen involving the collecting systems, ureters, or bladder.    Vitals:   10/12/21 1037  BP: (!) 145/82  Pulse: (!) 53  NED. A&Ox3.   No respiratory distress   Abd soft, NT, ND Normal external genitalia with patent urethral meatus  Cystoscopy Procedure Note  Patient identification was confirmed, informed consent was obtained, and patient was prepped using Betadine solution.  Lidocaine jelly was administered per urethral meatus.    Procedure: - Flexible cystoscope introduced, without any difficulty.   - Thorough search of the bladder revealed:    normal urethral meatus    normal urothelium    no stones    no ulcers     no tumors    no urethral polyps    no trabeculation  - Ureteral orifices were normal in position and appearance.  Post-Procedure: - Patient tolerated the procedure well  Assessment/ Plan:  Gross hematuria  - High risk hematuria and smoker - Cystoscopy was unremarkable today  - Will plan to recheck UA in a year advised to return if she has hematuria between then   2. Left lower pole calculus  - CT showed 4 mm calculus in left lower pole (8 mm in coronal plane - nonobstructive present since 2017 although slightly enlargement  - Will continue to monitor with imagining    F/u 1 year UA/ KUB  I,Kailey Littlejohn,acting as a scribe for Vanna Scotland, MD.,have documented all relevant  documentation on the behalf of Vanna Scotland, MD,as directed by  Vanna Scotland, MD while in the presence of Vanna Scotland, MD.  I have reviewed the above documentation for accuracy and completeness, and I agree with the above.   Vanna Scotland, MD

## 2021-10-15 IMAGING — DX DG CHEST 1V PORT
1 series · 1 of 1 positions shown · non-contrast
Comparison: 01/10/2019, 01/08/2019, 04/23/2018

CLINICAL DATA: Desaturation

EXAM:
PORTABLE CHEST 1 VIEW

[chest]
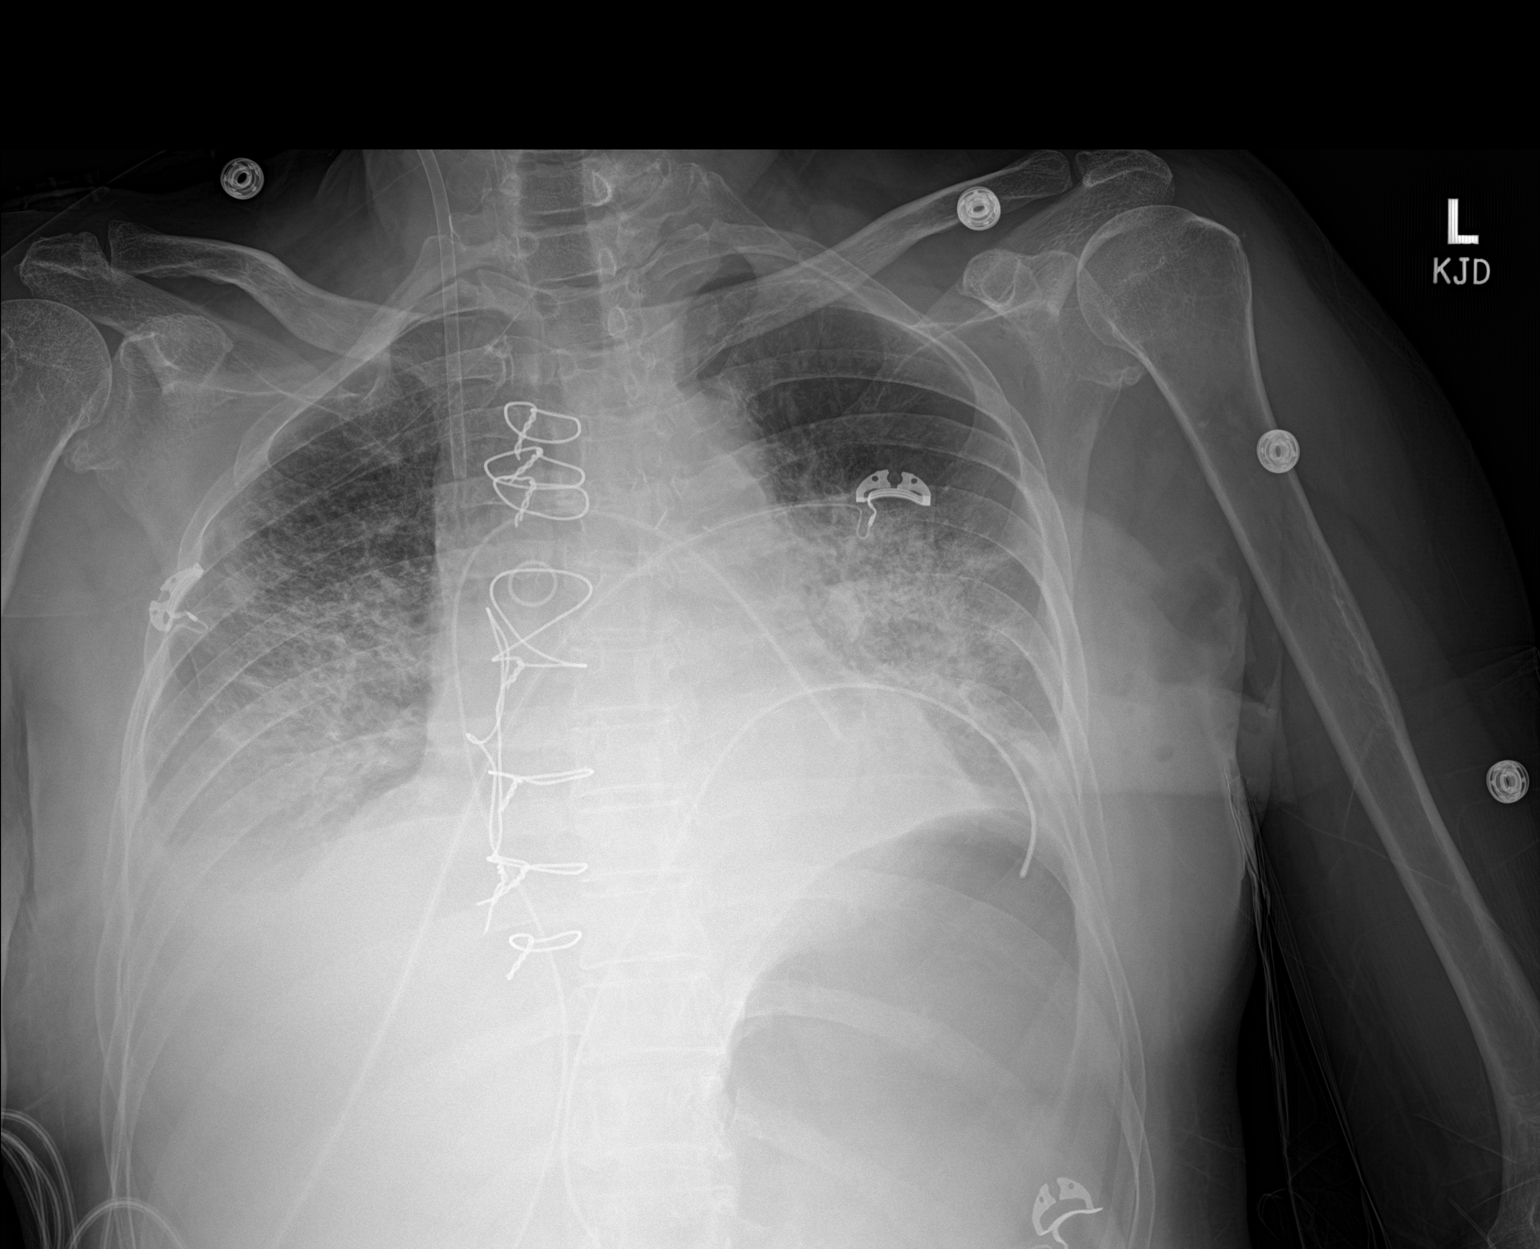

[1 of 1 positions shown; findings below may reference images not displayed]

FINDINGS: Post sternotomy changes. Removal of right Swan-Ganz catheter,
catheter sheath remains in place with the tip superimposing the
upper SVC. Central chest drainage tube similar in position. Left
lung base chest tube with decreased distal coiling. Small bilateral
pleural effusions and dense basilar airspace disease. Enlarged
cardiomediastinal silhouette. Perihilar interstitial and
ground-glass opacity slightly worse. Low lung volumes. Possible gas
within the left lateral chest wall soft tissues.
IMPRESSION: 1. Removal of Swan-Ganz catheter, right IJ catheter sheath remains
in place. Repositioning of chest tube over the left lung base with
the tip now positioned more laterally and appearing less curled.
2. Small bilateral pleural effusions, right greater than left. Dense
atelectasis or infiltrates at the bases. Worsened perihilar
interstitial and ground-glass opacity, probable edema.
3. Possible small amount of soft tissue gas in the left lateral
chest wall.

## 2021-10-15 IMAGING — DX DG CHEST 1V PORT
1 series · 1 of 1 positions shown · non-contrast
Comparison: Yesterday

CLINICAL DATA: Chest tube after CABG

EXAM:
PORTABLE CHEST 1 VIEW

[chest ap]
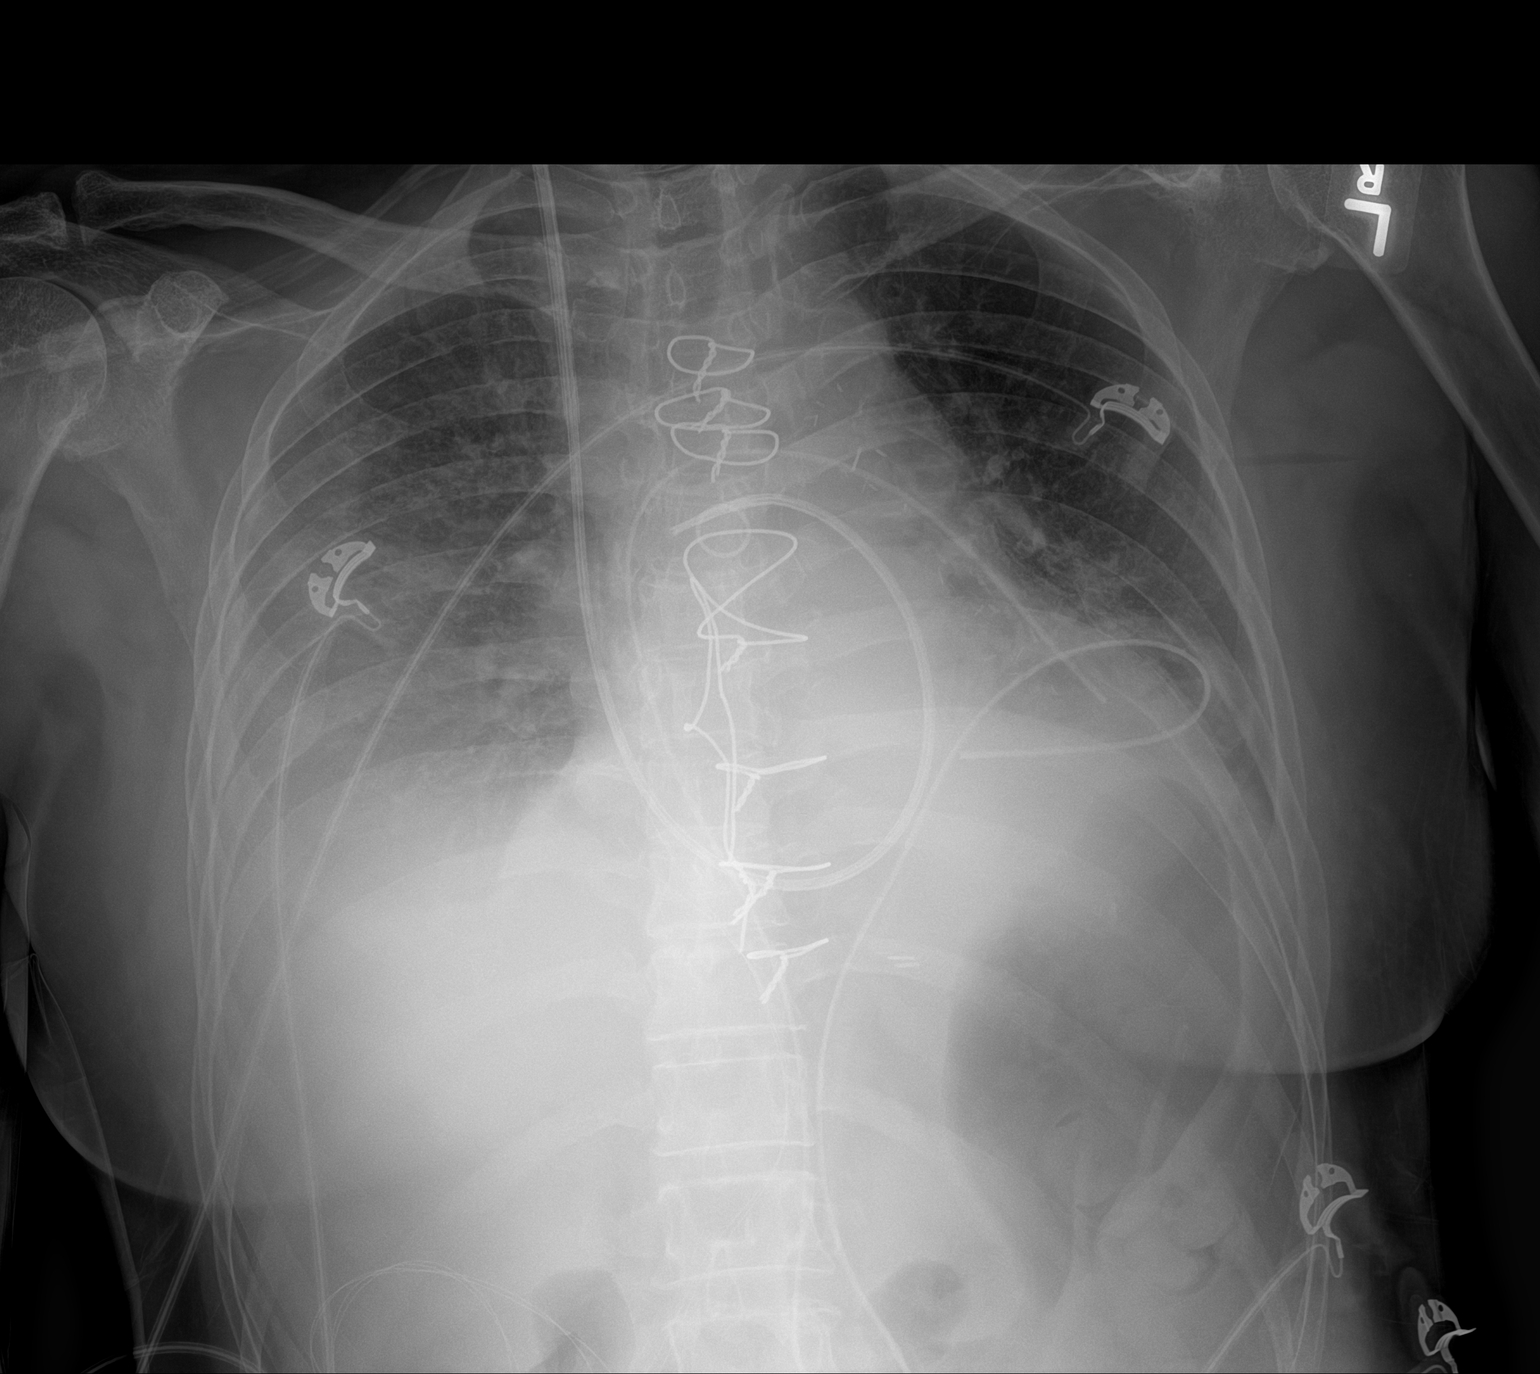

[1 of 1 positions shown; findings below may reference images not displayed]

FINDINGS: Tracheal and esophageal extubation with lower volumes and increased
bilateral hazy density attributed atelectasis and pleural fluid.
Stable remaining hardware including Swan-Ganz catheter at the right
main pulmonary artery. No convincing pneumothorax.
IMPRESSION: Lower volumes and increased atelectasis after extubation. No
convincing pneumothorax.

## 2021-10-16 ENCOUNTER — Other Ambulatory Visit: Payer: Self-pay | Admitting: Cardiology

## 2021-10-16 DIAGNOSIS — E782 Mixed hyperlipidemia: Secondary | ICD-10-CM

## 2021-10-16 DIAGNOSIS — I2510 Atherosclerotic heart disease of native coronary artery without angina pectoris: Secondary | ICD-10-CM

## 2021-10-16 DIAGNOSIS — Z951 Presence of aortocoronary bypass graft: Secondary | ICD-10-CM

## 2021-10-16 DIAGNOSIS — I6523 Occlusion and stenosis of bilateral carotid arteries: Secondary | ICD-10-CM

## 2021-10-27 NOTE — Progress Notes (Deleted)
Established patient visit   Patient: Regina Perry   DOB: Jan 21, 1961   61 y.o. Female  MRN: [id number] Visit Date: 10/28/2021   No chief complaint on file.  Subjective    HPI  Follow up for restless legs -started requip at 0.5 mg at bedtime.  -seeing urology due to gross hematuria    Medications: Outpatient Medications Prior to Visit  Medication Sig   acetaminophen (TYLENOL) 500 MG tablet Take 1,000 mg by mouth every 6 (six) hours as needed for moderate pain or headache.   ALPRAZolam (XANAX) 0.5 MG tablet Take 0.5 mg by mouth in the morning and at bedtime.   buPROPion ER (WELLBUTRIN SR) 100 MG 12 hr tablet TAKE 1 TABLET BY MOUTH 2 TIMES DAILY.   cetirizine (ZYRTEC) 10 MG tablet Take 10 mg by mouth at bedtime.   clopidogrel (PLAVIX) 75 MG tablet TAKE 1 TABLET EVERY DAY   escitalopram (LEXAPRO) 20 MG tablet Take 20 mg by mouth at bedtime.   gabapentin (NEURONTIN) 300 MG capsule Take 1 capsule (300 mg total) by mouth 3 (three) times daily.   HYDROcodone-acetaminophen (NORCO) 10-325 MG tablet Take 1 tablet by mouth every 6 (six) hours as needed for moderate pain.   losartan (COZAAR) 25 MG tablet TAKE 1 TABLET EVERY EVENING   metoprolol succinate (TOPROL-XL) 25 MG 24 hr tablet TAKE 1 TABLET EVERY DAY   nitroGLYCERIN (NITROSTAT) 0.4 MG SL tablet Place 0.4 mg under the tongue every 5 (five) minutes as needed for chest pain.   pantoprazole (PROTONIX) 40 MG tablet TAKE 1 TABLET EVERY DAY   promethazine (PHENERGAN) 25 MG tablet Take 25 mg every 6 (six) hours as needed by mouth for nausea or vomiting.   rOPINIRole (REQUIP) 0.5 MG tablet Take 1 tablet (0.5 mg total) by mouth at bedtime.   rosuvastatin (CRESTOR) 40 MG tablet TAKE 1 TABLET (40 MG TOTAL) BY MOUTH DAILY.   SUMAtriptan (IMITREX) 100 MG tablet Take 100 mg by mouth every 2 (two) hours as needed for migraine or headache.   traZODone (DESYREL) 50 MG tablet Take 0.5-1 tablets (25-50 mg total) by mouth at bedtime as needed for sleep.    vitamin B-12 (CYANOCOBALAMIN) 500 MCG tablet Take 500 mcg by mouth daily.   No facility-administered medications prior to visit.    Review of Systems  {Labs (Optional):23779}   Objective    There were no vitals taken for this visit. BP Readings from Last 3 Encounters:  10/12/21 (!) 145/82  09/08/21 (!) 153/80  08/26/21 108/64    Wt Readings from Last 3 Encounters:  10/12/21 136 lb (61.7 kg)  09/08/21 136 lb (61.7 kg)  08/26/21 135 lb 12.8 oz (61.6 kg)    Physical Exam  ***  No results found for any visits on 10/28/21.  Assessment & Plan     Problem List Items Addressed This Visit   None    No follow-ups on file.         Carlean Jews, NP  [facility] [phone] (phone) [phone] (fax)  [facility]

## 2021-10-28 ENCOUNTER — Ambulatory Visit: Payer: Medicare HMO | Admitting: Nurse Practitioner

## 2021-11-02 ENCOUNTER — Ambulatory Visit: Payer: Medicare HMO | Admitting: Student in an Organized Health Care Education/Training Program

## 2021-11-15 NOTE — Progress Notes (Signed)
Established patient visit   Patient: Regina Perry   DOB: 1960-04-09   61 y.o. Female  MRN: [id number] Visit Date: 11/16/2021   Chief Complaint  Patient presents with   Follow-up   Subjective    HPI  Follow up restless legs -started ropinirole 0.5 mg at bedtime. Advised her to increase to two tablets if needed and if tolerated well.  -had been referred to pain management due to history of severe degenerative disc disease and sciatica  -history of anxiety/ may need to refill alprazolam. She currently takes 0.5 mg twice daily as needed. Takes lexapro 20 mg daily and wellbutrin 100 mg twice daily to manage generalized anxiety.  -needs to have new prescription for her allergy medication.   Medications: Outpatient Medications Prior to Visit  Medication Sig   acetaminophen (TYLENOL) 500 MG tablet Take 1,000 mg by mouth every 6 (six) hours as needed for moderate pain or headache.   buPROPion ER (WELLBUTRIN SR) 100 MG 12 hr tablet TAKE 1 TABLET BY MOUTH 2 TIMES DAILY.   clopidogrel (PLAVIX) 75 MG tablet TAKE 1 TABLET EVERY DAY   HYDROcodone-acetaminophen (NORCO) 10-325 MG tablet Take 1 tablet by mouth every 6 (six) hours as needed for moderate pain.   losartan (COZAAR) 25 MG tablet TAKE 1 TABLET EVERY EVENING   rOPINIRole (REQUIP) 0.5 MG tablet Take 1 tablet (0.5 mg total) by mouth at bedtime.   vitamin B-12 (CYANOCOBALAMIN) 500 MCG tablet Take 500 mcg by mouth daily.   [DISCONTINUED] ALPRAZolam (XANAX) 0.5 MG tablet Take 0.5 mg by mouth in the morning and at bedtime.   [DISCONTINUED] cetirizine (ZYRTEC) 10 MG tablet Take 10 mg by mouth at bedtime.   [DISCONTINUED] escitalopram (LEXAPRO) 20 MG tablet Take 20 mg by mouth at bedtime.   [DISCONTINUED] gabapentin (NEURONTIN) 300 MG capsule Take 1 capsule (300 mg total) by mouth 3 (three) times daily.   [DISCONTINUED] metoprolol succinate (TOPROL-XL) 25 MG 24 hr tablet TAKE 1 TABLET EVERY DAY   [DISCONTINUED] nitroGLYCERIN (NITROSTAT) 0.4 MG SL  tablet Place 0.4 mg under the tongue every 5 (five) minutes as needed for chest pain.   [DISCONTINUED] pantoprazole (PROTONIX) 40 MG tablet TAKE 1 TABLET EVERY DAY   [DISCONTINUED] promethazine (PHENERGAN) 25 MG tablet Take 25 mg every 6 (six) hours as needed by mouth for nausea or vomiting.   [DISCONTINUED] rosuvastatin (CRESTOR) 40 MG tablet TAKE 1 TABLET (40 MG TOTAL) BY MOUTH DAILY.   [DISCONTINUED] SUMAtriptan (IMITREX) 100 MG tablet Take 100 mg by mouth every 2 (two) hours as needed for migraine or headache.   [DISCONTINUED] traZODone (DESYREL) 50 MG tablet Take 0.5-1 tablets (25-50 mg total) by mouth at bedtime as needed for sleep.   No facility-administered medications prior to visit.    Review of Systems  Constitutional:  Positive for fatigue. Negative for activity change, appetite change, chills and fever.  HENT:  Negative for congestion, postnasal drip, rhinorrhea, sinus pressure, sinus pain, sneezing and sore throat.   Eyes: Negative.   Respiratory:  Negative for cough, chest tightness, shortness of breath and wheezing.   Cardiovascular:  Negative for chest pain and palpitations.  Gastrointestinal:  Negative for abdominal pain, constipation, diarrhea, nausea and vomiting.  Endocrine: Negative for cold intolerance, heat intolerance, polydipsia and polyuria.  Genitourinary:  Negative for dyspareunia, dysuria, flank pain, frequency, hematuria and urgency.  Musculoskeletal:  Positive for arthralgias, back pain and myalgias.       Leg cramps and leg twitching, most severe at night.  Skin:  Negative for rash.  Allergic/Immunologic: Positive for environmental allergies.  Neurological:  Negative for dizziness, weakness and headaches.  Hematological:  Negative for adenopathy.  Psychiatric/Behavioral:  Positive for sleep disturbance. The patient is nervous/anxious.     Last CBC Lab Results  Component Value Date   WBC 8.4 03/31/2021   HGB 14.8 03/31/2021   HCT 43.3 03/31/2021    MCV 89 03/31/2021   MCH 30.4 03/31/2021   RDW 13.1 03/31/2021   PLT 158 54/11/8117   Last metabolic panel Lab Results  Component Value Date   GLUCOSE 99 03/31/2021   NA 143 03/31/2021   K 4.5 03/31/2021   CL 107 (H) 03/31/2021   CO2 23 03/31/2021   BUN 13 03/31/2021   CREATININE 1.10 (H) 09/22/2021   EGFR 49 (L) 03/31/2021   CALCIUM 9.0 03/31/2021   PROT 6.5 03/31/2021   ALBUMIN 4.4 03/31/2021   LABGLOB 2.1 03/31/2021   AGRATIO 2.1 03/31/2021   BILITOT 0.4 03/31/2021   ALKPHOS 66 03/31/2021   AST 16 03/31/2021   ALT 13 03/31/2021   ANIONGAP 10 01/06/2021   Last lipids Lab Results  Component Value Date   CHOL 146 03/31/2021   HDL 57 03/31/2021   LDLCALC 70 03/31/2021   LDLDIRECT 61 03/09/2020   TRIG 103 03/31/2021   CHOLHDL 2.6 03/31/2021   Last hemoglobin A1c Lab Results  Component Value Date   HGBA1C 5.7 (H) 03/31/2021   Last thyroid functions Lab Results  Component Value Date   TSH 2.730 03/31/2021       Objective     Today's Vitals   11/16/21 1401  BP: 116/68  Pulse: (Abnormal) 55  SpO2: 96%  Weight: 134 lb 1.9 oz (60.8 kg)  Height: 5' 2.5" (1.588 m)   Body mass index is 24.14 kg/m.   BP Readings from Last 3 Encounters:  11/16/21 116/68  10/12/21 (Abnormal) 145/82  09/08/21 (Abnormal) 153/80    Wt Readings from Last 3 Encounters:  11/16/21 134 lb 1.9 oz (60.8 kg)  10/12/21 136 lb (61.7 kg)  09/08/21 136 lb (61.7 kg)    Physical Exam Vitals and nursing note reviewed.  Constitutional:      Appearance: Normal appearance. She is well-developed.  HENT:     Head: Normocephalic and atraumatic.     Right Ear: Tympanic membrane, ear canal and external ear normal.     Left Ear: Tympanic membrane, ear canal and external ear normal.     Nose: Congestion present.     Mouth/Throat:     Mouth: Mucous membranes are moist.     Pharynx: Oropharynx is clear.  Eyes:     Extraocular Movements: Extraocular movements intact.     Conjunctiva/sclera:  Conjunctivae normal.     Pupils: Pupils are equal, round, and reactive to light.  Cardiovascular:     Rate and Rhythm: Normal rate and regular rhythm.     Pulses: Normal pulses.     Heart sounds: Normal heart sounds.  Pulmonary:     Effort: Pulmonary effort is normal.     Breath sounds: Normal breath sounds.  Abdominal:     Palpations: Abdomen is soft.  Musculoskeletal:        General: Normal range of motion.     Cervical back: Normal range of motion and neck supple.     Comments:  Moderate lowerr back pain which is worse with bending and twisting at the waist.    Lymphadenopathy:     Cervical: No cervical adenopathy.  Skin:    General: Skin is warm and dry.     Capillary Refill: Capillary refill takes less than 2 seconds.  Neurological:     General: No focal deficit present.     Mental Status: She is alert and oriented to person, place, and time.  Psychiatric:        Attention and Perception: Attention and perception normal.        Mood and Affect: Affect normal. Mood is anxious.        Speech: Speech normal.        Behavior: Behavior is cooperative.        Thought Content: Thought content normal.        Cognition and Memory: Cognition and memory normal.        Judgment: Judgment normal.       Assessment & Plan    1. Restless leg syndrome Improved. Continue requip as prescribed. Refill as indicated.   2. Left sided sciatica Start Medrol taper.  Take as directed for 6 days.  Increases in gabapentin to 300 mg up to 3 times daily as needed. - gabapentin (NEURONTIN) 300 MG capsule; Take 1 capsule (300 mg total) by mouth 3 (three) times daily.  Dispense: 270 capsule; Refill: 1 - methylPREDNISolone (MEDROL) 4 MG TBPK tablet; Take by mouth as directed for 6 days  Dispense: 21 tablet; Refill: 0  3. Primary insomnia May take trazodone 25 to 50 mg at bedtime as needed. - traZODone (DESYREL) 50 MG tablet; Take 0.5-1 tablets (25-50 mg total) by mouth at bedtime as needed for sleep.   Dispense: 30 tablet; Refill: 3  4. Generalized anxiety disorder Stable.  Continue Lexapro as prescribed.  May take alprazolam 0.5 mg twice daily if needed for acute anxiety. - ALPRAZolam (XANAX) 0.5 MG tablet; Take 1 tablet (0.5 mg total) by mouth in the morning and at bedtime.  Dispense: 30 tablet; Refill: 3 - escitalopram (LEXAPRO) 20 MG tablet; Take 1 tablet (20 mg total) by mouth at bedtime.  Dispense: 90 tablet; Refill: 1  5. Perennial allergic rhinitis Continue Zyrtec daily. - cetirizine (ZYRTEC) 10 MG tablet; Take 1 tablet (10 mg total) by mouth at bedtime.  Dispense: 90 tablet; Refill: 1  6. Atherosclerosis of native coronary artery of native heart without angina pectoris Continue rosuvastatin daily.  Regular visits with cardiology as scheduled. - rosuvastatin (CRESTOR) 40 MG tablet; Take 1 tablet (40 mg total) by mouth daily.  Dispense: 90 tablet; Refill: 1  7. Atherosclerosis of both carotid arteries Continue rosuvastatin daily.  Regular visits with cardiology as scheduled. - rosuvastatin (CRESTOR) 40 MG tablet; Take 1 tablet (40 mg total) by mouth daily.  Dispense: 90 tablet; Refill: 1  8. Mixed hyperlipidemia Continue rosuvastatin daily.  - rosuvastatin (CRESTOR) 40 MG tablet; Take 1 tablet (40 mg total) by mouth daily.  Dispense: 90 tablet; Refill: 1   Problem List Items Addressed This Visit       Cardiovascular and Mediastinum   Atherosclerosis of native coronary artery of native heart without angina pectoris   Relevant Medications   rosuvastatin (CRESTOR) 40 MG tablet   Atherosclerosis of both carotid arteries   Relevant Medications   rosuvastatin (CRESTOR) 40 MG tablet     Respiratory   Perennial allergic rhinitis   Relevant Medications   cetirizine (ZYRTEC) 10 MG tablet     Nervous and Auditory   Left sided sciatica   Relevant Medications   ALPRAZolam (XANAX) 0.5 MG tablet   escitalopram (LEXAPRO) 20  MG tablet   traZODone (DESYREL) 50 MG tablet    gabapentin (NEURONTIN) 300 MG capsule   methylPREDNISolone (MEDROL) 4 MG TBPK tablet     Other   Hyperlipidemia   Relevant Medications   rosuvastatin (CRESTOR) 40 MG tablet   Primary insomnia   Relevant Medications   traZODone (DESYREL) 50 MG tablet   Restless leg syndrome - Primary   Generalized anxiety disorder   Relevant Medications   ALPRAZolam (XANAX) 0.5 MG tablet   escitalopram (LEXAPRO) 20 MG tablet   traZODone (DESYREL) 50 MG tablet     No follow-ups on file.         Ronnell Freshwater, NP  [facility] at [facility] [phone] (phone) [phone] (fax)  [place]

## 2021-11-16 ENCOUNTER — Encounter: Payer: Self-pay | Admitting: Nurse Practitioner

## 2021-11-16 ENCOUNTER — Ambulatory Visit (INDEPENDENT_AMBULATORY_CARE_PROVIDER_SITE_OTHER): Payer: Medicare HMO | Admitting: Nurse Practitioner

## 2021-11-16 VITALS — BP 116/68 | HR 55 | Ht 62.5 in | Wt 134.1 lb

## 2021-11-16 DIAGNOSIS — E782 Mixed hyperlipidemia: Secondary | ICD-10-CM

## 2021-11-16 DIAGNOSIS — J3089 Other allergic rhinitis: Secondary | ICD-10-CM

## 2021-11-16 DIAGNOSIS — G2581 Restless legs syndrome: Secondary | ICD-10-CM | POA: Diagnosis not present

## 2021-11-16 DIAGNOSIS — M5432 Sciatica, left side: Secondary | ICD-10-CM | POA: Diagnosis not present

## 2021-11-16 DIAGNOSIS — F411 Generalized anxiety disorder: Secondary | ICD-10-CM | POA: Diagnosis not present

## 2021-11-16 DIAGNOSIS — I6523 Occlusion and stenosis of bilateral carotid arteries: Secondary | ICD-10-CM

## 2021-11-16 DIAGNOSIS — I2510 Atherosclerotic heart disease of native coronary artery without angina pectoris: Secondary | ICD-10-CM

## 2021-11-16 DIAGNOSIS — F5101 Primary insomnia: Secondary | ICD-10-CM

## 2021-11-16 MED ORDER — CETIRIZINE HCL 10 MG PO TABS: 10.0000 mg | ORAL_TABLET | Freq: Every day | ORAL | 1 refills | Status: DC

## 2021-11-16 MED ORDER — TRAZODONE HCL 50 MG PO TABS: 25.0000 mg | ORAL_TABLET | Freq: Every evening | ORAL | 3 refills | Status: DC | PRN

## 2021-11-16 MED ORDER — ROSUVASTATIN CALCIUM 40 MG PO TABS: 40.0000 mg | ORAL_TABLET | Freq: Every day | ORAL | 1 refills | Status: DC

## 2021-11-16 MED ORDER — ALPRAZOLAM 0.5 MG PO TABS
0.5000 mg | ORAL_TABLET | Freq: Two times a day (BID) | ORAL | 3 refills | Status: DC
Start: 2021-11-16 — End: 2022-04-22

## 2021-11-16 MED ORDER — METHYLPREDNISOLONE 4 MG PO TBPK: ORAL_TABLET | ORAL | 0 refills | Status: DC

## 2021-11-16 MED ORDER — ESCITALOPRAM OXALATE 20 MG PO TABS: 20.0000 mg | ORAL_TABLET | Freq: Every day | ORAL | 1 refills | Status: DC

## 2021-11-16 MED ORDER — GABAPENTIN 300 MG PO CAPS
300.0000 mg | ORAL_CAPSULE | Freq: Three times a day (TID) | ORAL | 1 refills | Status: DC
Start: 2021-11-16 — End: 2022-07-13

## 2021-12-11 ENCOUNTER — Other Ambulatory Visit: Payer: Self-pay | Admitting: Cardiology

## 2021-12-11 DIAGNOSIS — K219 Gastro-esophageal reflux disease without esophagitis: Secondary | ICD-10-CM

## 2021-12-27 ENCOUNTER — Other Ambulatory Visit: Payer: Self-pay | Admitting: Cardiology

## 2021-12-27 ENCOUNTER — Other Ambulatory Visit: Payer: Self-pay | Admitting: Nurse Practitioner

## 2021-12-27 DIAGNOSIS — I2510 Atherosclerotic heart disease of native coronary artery without angina pectoris: Secondary | ICD-10-CM

## 2021-12-27 DIAGNOSIS — Z951 Presence of aortocoronary bypass graft: Secondary | ICD-10-CM

## 2021-12-27 MED ORDER — NITROGLYCERIN 0.4 MG SL SUBL: 0.4000 mg | SUBLINGUAL_TABLET | SUBLINGUAL | 0 refills | Status: DC | PRN

## 2021-12-27 MED ORDER — PROMETHAZINE HCL 25 MG PO TABS: 25.0000 mg | ORAL_TABLET | Freq: Four times a day (QID) | ORAL | 0 refills | Status: DC | PRN

## 2021-12-27 MED ORDER — SUMATRIPTAN SUCCINATE 100 MG PO TABS: 100.0000 mg | ORAL_TABLET | ORAL | 0 refills | Status: DC | PRN

## 2021-12-27 MED ORDER — METOPROLOL SUCCINATE ER 25 MG PO TB24: 25.0000 mg | ORAL_TABLET | Freq: Every day | ORAL | 0 refills | Status: DC

## 2022-01-02 DIAGNOSIS — I6523 Occlusion and stenosis of bilateral carotid arteries: Secondary | ICD-10-CM | POA: Insufficient documentation

## 2022-01-02 DIAGNOSIS — J3089 Other allergic rhinitis: Secondary | ICD-10-CM | POA: Insufficient documentation

## 2022-01-02 DIAGNOSIS — F411 Generalized anxiety disorder: Secondary | ICD-10-CM | POA: Insufficient documentation

## 2022-01-02 DIAGNOSIS — I2510 Atherosclerotic heart disease of native coronary artery without angina pectoris: Secondary | ICD-10-CM | POA: Insufficient documentation

## 2022-02-10 ENCOUNTER — Ambulatory Visit: Payer: Medicare HMO | Admitting: Cardiology

## 2022-02-15 ENCOUNTER — Ambulatory Visit: Payer: Medicare HMO | Admitting: Nurse Practitioner

## 2022-02-15 NOTE — Progress Notes (Deleted)
Established patient visit   Patient: Regina Perry   DOB: [birth year]   61 y.o. Female  MRN: [id number] Visit Date: 02/15/2022   No chief complaint on file.  Subjective    HPI  Follow up -generalized anxiety -restless legs  -due to have annual wellness visit --had GYN visit earlier this month --referred to GI for colon cancer screening  --referred to different PCP   Medications: Outpatient Medications Prior to Visit  Medication Sig   acetaminophen (TYLENOL) 500 MG tablet Take 1,000 mg by mouth every 6 (six) hours as needed for moderate pain or headache.   ALPRAZolam (XANAX) 0.5 MG tablet Take 1 tablet (0.5 mg total) by mouth in the morning and at bedtime.   buPROPion ER (WELLBUTRIN SR) 100 MG 12 hr tablet TAKE 1 TABLET BY MOUTH 2 TIMES DAILY.   cetirizine (ZYRTEC) 10 MG tablet Take 1 tablet (10 mg total) by mouth at bedtime.   clopidogrel (PLAVIX) 75 MG tablet TAKE 1 TABLET EVERY DAY   escitalopram (LEXAPRO) 20 MG tablet Take 1 tablet (20 mg total) by mouth at bedtime.   gabapentin (NEURONTIN) 300 MG capsule Take 1 capsule (300 mg total) by mouth 3 (three) times daily.   HYDROcodone-acetaminophen (NORCO) 10-325 MG tablet Take 1 tablet by mouth every 6 (six) hours as needed for moderate pain.   losartan (COZAAR) 25 MG tablet TAKE 1 TABLET EVERY EVENING   methylPREDNISolone (MEDROL) 4 MG TBPK tablet Take by mouth as directed for 6 days   metoprolol succinate (TOPROL-XL) 25 MG 24 hr tablet Take 1 tablet (25 mg total) by mouth daily.   nitroGLYCERIN (NITROSTAT) 0.4 MG SL tablet Place 1 tablet (0.4 mg total) under the tongue every 5 (five) minutes as needed for chest pain.   pantoprazole (PROTONIX) 40 MG tablet TAKE 1 TABLET EVERY DAY   promethazine (PHENERGAN) 25 MG tablet Take 1 tablet (25 mg total) by mouth every 6 (six) hours as needed for nausea or vomiting.   rOPINIRole (REQUIP) 0.5 MG tablet Take 1 tablet (0.5 mg total) by mouth at bedtime.   rosuvastatin (CRESTOR) 40 MG tablet  Take 1 tablet (40 mg total) by mouth daily.   SUMAtriptan (IMITREX) 100 MG tablet Take 1 tablet (100 mg total) by mouth every 2 (two) hours as needed for migraine or headache.   traZODone (DESYREL) 50 MG tablet Take 0.5-1 tablets (25-50 mg total) by mouth at bedtime as needed for sleep.   vitamin B-12 (CYANOCOBALAMIN) 500 MCG tablet Take 500 mcg by mouth daily.   No facility-administered medications prior to visit.    Review of Systems  {Labs (Optional):23779}   Objective    There were no vitals filed for this visit. There is no height or weight on file to calculate BMI.  BP Readings from Last 3 Encounters:  11/16/21 116/68  10/12/21 (Abnormal) 145/82  09/08/21 (Abnormal) 153/80    Wt Readings from Last 3 Encounters:  11/16/21 134 lb 1.9 oz (60.8 kg)  10/12/21 136 lb (61.7 kg)  09/08/21 136 lb (61.7 kg)    Physical Exam  ***  No results found for any visits on 02/15/22.  Assessment & Plan     Problem List Items Addressed This Visit   None    No follow-ups on file.         Carlean Jews, NP  [facility] at [facility] [phone] (phone) [phone] (fax)  [facility]

## 2022-02-21 ENCOUNTER — Ambulatory Visit: Payer: Medicare HMO | Admitting: Surgery

## 2022-02-21 ENCOUNTER — Encounter: Payer: Self-pay | Admitting: Surgery

## 2022-02-21 VITALS — BP 132/76 | HR 61 | Temp 98.1°F | Ht 63.0 in | Wt 137.2 lb

## 2022-02-21 DIAGNOSIS — R1310 Dysphagia, unspecified: Secondary | ICD-10-CM | POA: Diagnosis not present

## 2022-02-21 NOTE — Progress Notes (Unsigned)
Outpatient Surgical Follow Up  02/21/2022  Regina Perry is an 61 y.o. female.   Chief Complaint  Patient presents with   New Patient (Initial Visit)    Diaphragmatic hernia    HPI: 61 year old female seen in consultation for recalcitrant reflux.  Endorses heartburn at partial relief with PPI. Worse when laying supine. Endorses additional colicky non specific abdominal pain, no specific aggravating or allevating factors. She endorses GERD and chronic  cough EGD 9 years ago small HH images personally reviewed. SHe does have significant anxiety disorder and chronic pain w chronic narcotic use. Ct pers. reviewed tiny sliding HH No dysphagia. Takes hydrocodone daily for back pain. Disabled after shoulder injury. Abdominal pain diffuse intermittent and colicky type. 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 She  Does have a preserved ejection fraction and no recent evidence of acute coronary disease Smokes daily. 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   CORONARY ARTER(CABG) X2  LIMA to LAD and SVG TO OM1 01/09/19    Vitamin D deficiency     Past  Surgical History:  Procedure Laterality Date   ABDOMINAL HYSTERECTOMY     partial   BREAST BIOPSY     CESAREAN SECTION     COLONOSCOPY     CORONARY ARTERY BYPASS GRAFT N/A 01/09/2019   Procedure: CORONARY ARTERY BYPASS GRAFTING (CABG) X2 USING LEFT INTERNAL MAMMARY ARTERY TO CIRC AND RIGHT GREATER SAPHENOUS VEIN TO LAD.;  Surgeon: Lajuana Matte, MD;  Location: [place];  Service: Open Heart Surgery;  Laterality: N/A;   CORONARY ARTERY BYPASS GRAFT     ETHMOIDECTOMY Bilateral 06/26/2014   Procedure: BILATERAL ANTERIOR ETHMOIDECTOMY;  Surgeon: Rozetta Nunnery, MD;  Location: [place];  Service: ENT;  Laterality: Bilateral;   LEFT HEART CATH AND CORONARY ANGIOGRAPHY N/A 01/08/2019   Procedure: LEFT HEART CATH AND CORONARY ANGIOGRAPHY;  Surgeon: Nigel Mormon, MD;  Location: [place] [facility];  Service: Cardiovascular;  Laterality: N/A;   MAXILLARY ANTROSTOMY Bilateral 06/26/2014   Procedure: BILATERAL MAXILLARY OSTEA ENLARGEMENT;  Surgeon: Rozetta Nunnery, MD;  Location: [place];  Service: ENT;  Laterality: Bilateral;   NASAL SEPTOPLASTY W/ TURBINOPLASTY Bilateral 06/26/2014   Procedure: BILATERAL NASAL SEPTOPLASTY WITH TURBINATE REDUCTION;  Surgeon: Rozetta Nunnery, MD;  Location: [place];  Service: ENT;  Laterality: Bilateral;   SHOULDER ARTHROSCOPY W/ ROTATOR CUFF REPAIR  08/2013   right   TEE WITHOUT CARDIOVERSION  01/09/2019  Procedure: Transesophageal Echocardiogram (Tee);  Surgeon: Lajuana Matte, MD;  Location[facility];  Service: Open Heart Surgery;;   TONSILLECTOMY AND ADENOIDECTOMY     TUBAL LIGATION      Family History  Problem Relation Age of Onset   Heart disease Mother        MI   Irritable bowel syndrome Mother    Migraines Mother    Aneurysm Mother        brain   Cerebral aneurysm Father    AAA (abdominal aortic aneurysm) Sister    Breast cancer Paternal Grandmother    Lung cancer  Paternal Grandfather    Colon cancer Neg Hx    Esophageal cancer Neg Hx    Rectal cancer Neg Hx    Stomach cancer Neg Hx     Social History:  reports that she has been smoking cigarettes. She started smoking about 3 years ago. She has a 8.75 pack-year smoking history. She has never used smokeless tobacco. She reports that she does not drink alcohol and does not use drugs.  Allergies:  Allergies  Allergen Reactions   Levofloxacin Nausea Only   Percocet [Oxycodone-Acetaminophen] Other (See Comments)    Sick on stomach when taken with anesthesia     Medications reviewed.    ROS Full ROS performed and is otherwise negative other than what is stated in HPI   BP 132/76   Pulse 61   Temp 98.1 F (36.7 C) (Oral)   Ht 5\' 3"  (1.6 m)   Wt 137 lb 3.2 oz (62.2 kg)   SpO2 94%   BMI 24.30 kg/m   Physical Exam Vitals and nursing note reviewed. Exam conducted with a chaperone present.  Constitutional:      General: She is not in acute distress.    Appearance: Normal appearance. She is obese. She is not toxic-appearing.  Eyes:     General: No scleral icterus.       Right eye: No discharge.        Left eye: No discharge.  Cardiovascular:     Rate and Rhythm: Normal rate and regular rhythm.     Heart sounds: No murmur heard.    No friction rub.  Pulmonary:     Effort: Pulmonary effort is normal. No respiratory distress.     Breath sounds: No stridor. Wheezing present.  Abdominal:     General: Abdomen is flat. There is no distension.     Palpations: Abdomen is soft. There is no mass.     Tenderness: There is abdominal tenderness. There is no guarding or rebound.     Hernia: No hernia is present.     Comments: Tender epigastrium and LLQ , no peritonitis or rebound  Musculoskeletal:        General: No swelling or tenderness. Normal range of motion.     Cervical back: Normal range of motion and neck supple. No rigidity.  Skin:    General: Skin is warm and dry.     Capillary  Refill: Capillary refill takes less than 2 seconds.     Coloration: Skin is not jaundiced.  Neurological:     General: No focal deficit present.     Mental Status: She is alert and oriented to person, place, and time.  Psychiatric:        Mood and Affect: Mood normal.        Behavior: Behavior normal.        Thought Content: Thought content normal.  Judgment: Judgment normal.     Assessment/Plan: 61 year old female with significant reflux disease but with significant other comorbidities to include coronary artery disease, chronic back pain, anxiety. Gust with patient in detail about her disease process.  Apparently her reflux symptoms are recalcitrant and significant.  I was very honest with her regarding results and realistic expectations.  Antireflux surgery will certainly address the reflux but will not address any other functional GI issues especially chronic pain.  She seems to understand days but wishes to continue further workup.  We will order CT A/p and barium swallow. EGD and colonoscopy. We did talk about antireflux surgery, outcomes postoperative course, the surgery itself the risk the benefits and the possible complications. Will see her back after she completes appropriate workup Please note that I have spent 60 minutes in this encounter including personally reviewing imaging studies, coordinating his care, counseling the patient and performing appropriate documentation    Sterling Big, MD [facility] General Surgeon

## 2022-02-21 NOTE — Patient Instructions (Addendum)
Your CT is scheduled for 03/07/2022 at 8 am (arrive by 7:45 am) @ [facility].Nothing to eat or drink 4 hours prior to scan.    Your Barium Swallow is scheduled for for 03/07/2022 at 12 pm (arrive by 11:45 am) @ [facility]. Nothing to eat or drink 3 hours prior.    A referral has been placed with [facility]. They will call you for an appointment.    If you have any concerns or questions, please feel free to call our office. See follow up appointment below.   Food Choices for Gastroesophageal Reflux Disease, Adult When you have gastroesophageal reflux disease (GERD), the foods you eat and your eating habits are very important. Choosing the right foods can help ease your discomfort. Think about working with a food expert (dietitian) to help you make good choices. What are tips for following this plan? Reading food labels Look for foods that are low in saturated fat. Foods that may help with your symptoms include: Foods that have less than 5% of daily value (DV) of fat. Foods that have 0 grams of trans fat. Cooking Do not fry your food. Cook your food by baking, steaming, grilling, or broiling. These are all methods that do not need a lot of fat for cooking. To add flavor, try to use herbs that are low in spice and acidity. Meal planning  Choose healthy foods that are low in fat, such as: Fruits and vegetables. Whole grains. Low-fat dairy products. Lean meats, fish, and poultry. Eat small meals often instead of eating 3 large meals each day. Eat your meals slowly in a place where you are relaxed. Avoid bending over or lying down until 2-3 hours after eating. Limit high-fat foods such as fatty meats or fried foods. Limit your intake of fatty foods, such as oils, butter, and shortening. Avoid the following as told by your doctor: Foods that cause symptoms. These may be different for different people. Keep a food diary to keep track of foods that cause  symptoms. Alcohol. Drinking a lot of liquid with meals. Eating meals during the 2-3 hours before bed. Lifestyle Stay at a healthy weight. Ask your doctor what weight is healthy for you. If you need to lose weight, work with your doctor to do so safely. Exercise for at least 30 minutes on 5 or more days each week, or as told by your doctor. Wear loose-fitting clothes. Do not smoke or use any products that contain nicotine or tobacco. If you need help quitting, ask your doctor. Sleep with the head of your bed higher than your feet. Use a wedge under the mattress or blocks under the bed frame to raise the head of the bed. Chew sugar-free gum after meals. What foods should eat?  Eat a healthy, well-balanced diet of fruits, vegetables, whole grains, low-fat dairy products, lean meats, fish, and poultry. Each person is different. Foods that may cause symptoms in one person may not cause any symptoms in another person. Work with your doctor to find foods that are safe for you. The items listed above may not be a complete list of what you can eat and drink. Contact a food expert for more options. What foods should I avoid? Limiting some of these foods may help in managing the symptoms of GERD. Everyone is different. Talk with a food expert or your doctor to help you find the exact foods to avoid, if any. Fruits Any fruits prepared with added fat. Any fruits  that cause symptoms. For some people, this may include citrus fruits, such as oranges, grapefruit, pineapple, and lemons. Vegetables Deep-fried vegetables. [place] fries. Any vegetables prepared with added fat. Any vegetables that cause symptoms. For some people, this may include tomatoes and tomato products, chili peppers, onions and garlic, and horseradish. Grains Pastries or quick breads with added fat. Meats and other proteins High-fat meats, such as fatty beef or pork, hot dogs, ribs, ham, sausage, salami, and bacon. Fried meat or protein,  including fried fish and fried chicken. Nuts and nut butters, in large amounts. Dairy Whole milk and chocolate milk. Sour cream. Cream. Ice cream. Cream cheese. Milkshakes. Fats and oils Butter. Margarine. Shortening. Ghee. Beverages Coffee and tea, with or without caffeine. Carbonated beverages. Sodas. Energy drinks. Fruit juice made with acidic fruits, such as orange or grapefruit. Tomato juice. Alcoholic drinks. Sweets and desserts Chocolate and cocoa. Donuts. Seasonings and condiments Pepper. Peppermint and spearmint. Added salt. Any condiments, herbs, or seasonings that cause symptoms. For some people, this may include curry, hot sauce, or vinegar-based salad dressings. The items listed above may not be a complete list of what you should not eat and drink. Contact a food expert for more options. Questions to ask your doctor Diet and lifestyle changes are often the first steps that are taken to manage symptoms of GERD. If diet and lifestyle changes do not help, talk with your doctor about taking medicines. Where to find more information International Foundation for Gastrointestinal Disorders: aboutgerd.org Summary When you have GERD, food and lifestyle choices are very important in easing your symptoms. Eat small meals often instead of 3 large meals a day. Eat your meals slowly and in a place where you are relaxed. Avoid bending over or lying down until 2-3 hours after eating. Limit high-fat foods such as fatty meats or fried foods. This information is not intended to replace advice given to you by your health care provider. Make sure you discuss any questions you have with your health care provider. Document Revised: 09/23/2019 Document Reviewed: 09/23/2019 Elsevier Patient Education  2023 [employer].

## 2022-02-24 ENCOUNTER — Encounter: Payer: Self-pay | Admitting: Surgery

## 2022-03-01 ENCOUNTER — Ambulatory Visit: Payer: Medicare HMO

## 2022-03-01 DIAGNOSIS — Z951 Presence of aortocoronary bypass graft: Secondary | ICD-10-CM

## 2022-03-01 DIAGNOSIS — I6523 Occlusion and stenosis of bilateral carotid arteries: Secondary | ICD-10-CM

## 2022-03-01 DIAGNOSIS — I2510 Atherosclerotic heart disease of native coronary artery without angina pectoris: Secondary | ICD-10-CM

## 2022-03-07 ENCOUNTER — Ambulatory Visit
Admission: RE | Admit: 2022-03-07 | Discharge: 2022-03-07 | Disposition: A | Discharge status: Home / Self Care | Payer: Medicare HMO | Source: Ambulatory Visit | Attending: Surgery | Admitting: Surgery

## 2022-03-07 DIAGNOSIS — I700 Atherosclerosis of aorta: Secondary | ICD-10-CM | POA: Insufficient documentation

## 2022-03-07 DIAGNOSIS — I7143 Infrarenal abdominal aortic aneurysm, without rupture: Secondary | ICD-10-CM | POA: Insufficient documentation

## 2022-03-07 DIAGNOSIS — K219 Gastro-esophageal reflux disease without esophagitis: Secondary | ICD-10-CM | POA: Insufficient documentation

## 2022-03-07 DIAGNOSIS — R1310 Dysphagia, unspecified: Secondary | ICD-10-CM | POA: Diagnosis not present

## 2022-03-07 LAB — POCT I-STAT CREATININE: Creatinine, Ser: 1.1 mg/dL — ABNORMAL HIGH (ref 0.44–1.00)

## 2022-03-07 MED ORDER — IOHEXOL 300 MG/ML  SOLN
100.0000 mL | Freq: Once | INTRAMUSCULAR | Status: AC | PRN
  Administered 2022-03-07: 100 mL via INTRAVENOUS

## 2022-03-08 NOTE — Progress Notes (Signed)
Spoke with patient, she has an appointment with her PCP for well visit and labs. I gave her all the labs we need and she said she will have them all drawn together. She is rescheduling her 12/15 appointment to sometime after her PCP appt.

## 2022-03-08 NOTE — Progress Notes (Signed)
Called patient, NA, LMAM

## 2022-03-09 ENCOUNTER — Other Ambulatory Visit: Payer: Self-pay | Admitting: Nurse Practitioner

## 2022-03-09 ENCOUNTER — Other Ambulatory Visit: Payer: Self-pay | Admitting: Cardiology

## 2022-03-09 DIAGNOSIS — Z951 Presence of aortocoronary bypass graft: Secondary | ICD-10-CM

## 2022-03-09 DIAGNOSIS — I10 Essential (primary) hypertension: Secondary | ICD-10-CM

## 2022-03-09 DIAGNOSIS — I2510 Atherosclerotic heart disease of native coronary artery without angina pectoris: Secondary | ICD-10-CM

## 2022-03-11 ENCOUNTER — Ambulatory Visit: Payer: Medicare HMO | Admitting: Cardiology

## 2022-03-16 ENCOUNTER — Ambulatory Visit: Payer: Medicare HMO | Admitting: Surgery

## 2022-03-29 ENCOUNTER — Ambulatory Visit: Payer: Medicare HMO | Admitting: Cardiology

## 2022-04-04 ENCOUNTER — Encounter: Payer: Self-pay | Admitting: Surgery

## 2022-04-04 ENCOUNTER — Ambulatory Visit: Payer: Medicare HMO | Admitting: Surgery

## 2022-04-04 VITALS — BP 149/82 | HR 56 | Temp 98.0°F | Ht 63.0 in | Wt 131.0 lb

## 2022-04-04 DIAGNOSIS — K219 Gastro-esophageal reflux disease without esophagitis: Secondary | ICD-10-CM | POA: Diagnosis not present

## 2022-04-04 NOTE — Patient Instructions (Signed)
Follow up with Dr Alice Reichert. Call us to schedule a follow up with Dr Dahlia Byes once you have your EGD scheduled.    Please call and ask to speak with a nurse if you develop questions or concerns.

## 2022-04-06 ENCOUNTER — Other Ambulatory Visit: Payer: Self-pay | Admitting: Cardiology

## 2022-04-06 ENCOUNTER — Other Ambulatory Visit: Payer: Self-pay | Admitting: Nurse Practitioner

## 2022-04-06 DIAGNOSIS — F411 Generalized anxiety disorder: Secondary | ICD-10-CM

## 2022-04-08 NOTE — Progress Notes (Signed)
Outpatient Surgical Follow Up  04/08/2022  Regina Perry is an 62 y.o. female.   Chief Complaint  Patient presents with   Follow-up    HPI: 62 year old female seen in consultation for recalcitrant reflux.  Endorses heartburn at partial relief with PPI. Worse when laying supine. Endorses additional colicky non specific abdominal pain, no specific aggravating or allevating factors. She endorses GERD and chronic  cough EGD 9 years ago small HH images personally reviewed. SHe does have significant anxiety disorder and chronic pain w chronic narcotic use. Ct pers. reviewed tiny sliding HH No dysphagia. Takes hydrocodone daily for back pain. Disabled after shoulder injury. Abdominal pain diffuse intermittent and colicky type. 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 She  Does have a preserved ejection fraction and no recent evidence of acute coronary disease Smokes daily. She Does have some degree of COPD Hx hysterectomy, history of spinal fusion CMP and cbc is nml Swallow  shows mild reflux Ct no major hernias  Past Medical History:  Diagnosis Date   Allergic rhinitis    Anxiety    Bronchitis    Depression    Diverticulitis    GERD (gastroesophageal reflux disease)    H/O cesarean section    History of hiatal hernia    History of kidney stones    LEFT KIDNEY 2 STONES JUST WATCHING( [facility])   Hypercholesteremia    Hyperplastic colon polyp    12/30/2008   Hypertension    Migraine    Palpitations    asymptomatic 4 and 3 beat NSVT 06/2015 (normal stress and echo), 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   CORONARY ARTER(CABG) X2  LIMA to LAD and SVG TO OM1 01/09/19    Vitamin D deficiency      Past Surgical History:  Procedure Laterality Date   ABDOMINAL HYSTERECTOMY     partial   BREAST BIOPSY     CESAREAN SECTION     COLONOSCOPY     CORONARY ARTERY BYPASS GRAFT N/A 01/09/2019   Procedure: CORONARY ARTERY BYPASS GRAFTING (CABG) X2 USING LEFT INTERNAL MAMMARY ARTERY TO CIRC AND RIGHT GREATER SAPHENOUS VEIN TO LAD.;  Surgeon: Corliss Skains, MD;  Location: [facility] OR;  Service: Open Heart Surgery;  Laterality: N/A;   CORONARY ARTERY BYPASS GRAFT     ETHMOIDECTOMY Bilateral 06/26/2014   Procedure: BILATERAL ANTERIOR ETHMOIDECTOMY;  Surgeon: Drema Halon, MD;  Location: [facility];  Service: ENT;  Laterality: Bilateral;   LEFT HEART CATH AND CORONARY ANGIOGRAPHY N/A 01/08/2019   Procedure: LEFT HEART CATH AND CORONARY ANGIOGRAPHY;  Surgeon: Elder Negus, MD;  Location: [facility];  Service: Cardiovascular;  Laterality: N/A;   MAXILLARY ANTROSTOMY Bilateral 06/26/2014   Procedure: BILATERAL MAXILLARY OSTEA ENLARGEMENT;  Surgeon: Drema Halon, MD;  Location: [facility];  Service: ENT;  Laterality: Bilateral;   NASAL SEPTOPLASTY W/ TURBINOPLASTY Bilateral 06/26/2014   Procedure: BILATERAL NASAL SEPTOPLASTY WITH TURBINATE REDUCTION;  Surgeon: Drema Halon, MD;  Location: [facility];  Service: ENT;  Laterality: Bilateral;   SHOULDER ARTHROSCOPY W/ ROTATOR CUFF REPAIR  08/2013   right   TEE WITHOUT CARDIOVERSION  01/09/2019   Procedure: Transesophageal Echocardiogram (Tee);  Surgeon: Lajuana Matte, MD;  Location: [facility] OR;  Service: Open Heart Surgery;;   TONSILLECTOMY AND ADENOIDECTOMY     TUBAL LIGATION      Family History  Problem Relation Age of Onset   Heart disease Mother        MI   Irritable bowel syndrome Mother    Migraines Mother    Aneurysm Mother        brain   Cerebral aneurysm Father    AAA (abdominal aortic aneurysm) Sister    Breast cancer Paternal Grandmother     Lung cancer Paternal Grandfather    Colon cancer Neg Hx    Esophageal cancer Neg Hx    Rectal cancer Neg Hx    Stomach cancer Neg Hx     Social History:  reports that she has been smoking cigarettes. She started smoking about 3 years ago. She has a 8.75 pack-year smoking history. She has been exposed to tobacco smoke. She has never used smokeless tobacco. She reports that she does not drink alcohol and does not use drugs.  Allergies:  Allergies  Allergen Reactions   Levofloxacin Nausea Only   Percocet [Oxycodone-Acetaminophen] Other (See Comments)    Sick on stomach when taken with anesthesia     Medications reviewed.    ROS Full ROS performed and is otherwise negative other than what is stated in HPI   BP (!) 149/82   Pulse (!) 56   Temp 98 F (36.7 C)   Ht 5\' 3"  (1.6 m)   Wt 131 lb (59.4 kg)   SpO2 97%   BMI 23.21 kg/m   Physical Exam Vitals and nursing note reviewed. Exam conducted with a chaperone present.  Constitutional:      General: She is not in acute distress.    Appearance: Normal appearance. She is obese. She is not toxic-appearing.  Eyes:     General: No scleral icterus.       Right eye: No discharge.        Left eye: No discharge.  Cardiovascular:     Rate and Rhythm: Normal rate and regular rhythm.     Heart sounds: No murmur heard.    No friction rub.  Pulmonary:     Effort: Pulmonary effort is normal. No respiratory distress.     Breath sounds: No stridor. Wheezing present.  Abdominal:     General: Abdomen is flat. There is no distension.     Palpations: Abdomen is soft. There is no mass.     Tenderness: There is abdominal tenderness. There is no guarding or rebound.     Hernia: No hernia is present.     Comments: Tender epigastrium and LLQ , no peritonitis or rebound  Musculoskeletal:        General: No swelling or tenderness. Normal range of motion.     Cervical back: Normal range of motion and neck supple. No rigidity.  Skin:     General: Skin is warm and dry.     Capillary Refill: Capillary refill takes less than 2 seconds.     Coloration: Skin is not jaundiced.  Neurological:     General: No focal deficit present.     Mental Status: She is alert and oriented to person, place, and time.  Psychiatric:        Mood and Affect: Mood normal.        Behavior: Behavior normal.  Thought Content: Thought content normal.        Judgment: Judgment normal.   Assessment/Plan: 62 year old female with significant reflux disease but with significant other comorbidities to include coronary artery disease, chronic back pain, anxiety. Discussed with patient in detail about her disease process.  Apparently her reflux symptoms are recalcitrant and significant.  I was very honest with her regarding results and realistic expectations.  Antireflux surgery will certainly address the reflux but will not address any other functional GI issues especially chronic pain.  She seems to understand days but wishes to continue further workup including EGD.   We did talk about antireflux surgery, outcomes postoperative course, the surgery itself the risk the benefits and the possible complications. She wishes to wait for now,. She seems to have a lot of personal issues going on at this time Please note that I have spent 40 minutes in this encounter including personally reviewing imaging studies, coordinating his care, counseling the patient and performing appropriate documentation  Caroleen Hamman, MD [facility]

## 2022-04-15 LAB — COMPREHENSIVE METABOLIC PANEL
Albumin: 4.1 (ref 3.5–5.0)
Calcium: 8.9 (ref 8.7–10.7)
Globulin: 2.2
eGFR: 57

## 2022-04-15 LAB — HEPATIC FUNCTION PANEL
ALT: 11 U/L (ref 7–35)
AST: 15 (ref 13–35)
Alkaline Phosphatase: 56 (ref 25–125)

## 2022-04-15 LAB — CBC AND DIFFERENTIAL
HCT: 43 (ref 36–46)
Hemoglobin: 13.9 (ref 12.0–16.0)
Platelets: 127 10*3/uL — AB (ref 150–400)
WBC: 7.5

## 2022-04-15 LAB — BASIC METABOLIC PANEL
BUN: 14 (ref 4–21)
Chloride: 108 (ref 99–108)
Creatinine: 1 (ref 0.5–1.1)
Glucose: 90
Potassium: 4 mEq/L (ref 3.5–5.1)
Sodium: 141 (ref 137–147)

## 2022-04-15 LAB — TSH: TSH: 1.2 (ref 0.41–5.90)

## 2022-04-15 LAB — CBC: RBC: 4.67 (ref 3.87–5.11)

## 2022-04-22 ENCOUNTER — Ambulatory Visit: Payer: Medicare HMO | Admitting: Cardiology

## 2022-04-22 ENCOUNTER — Encounter: Payer: Self-pay | Admitting: Cardiology

## 2022-04-22 VITALS — BP 109/68 | HR 66 | Resp 16 | Ht 63.0 in | Wt 132.6 lb

## 2022-04-22 DIAGNOSIS — I10 Essential (primary) hypertension: Secondary | ICD-10-CM

## 2022-04-22 DIAGNOSIS — I6523 Occlusion and stenosis of bilateral carotid arteries: Secondary | ICD-10-CM

## 2022-04-22 DIAGNOSIS — I2510 Atherosclerotic heart disease of native coronary artery without angina pectoris: Secondary | ICD-10-CM

## 2022-04-22 DIAGNOSIS — E782 Mixed hyperlipidemia: Secondary | ICD-10-CM

## 2022-04-22 DIAGNOSIS — Z951 Presence of aortocoronary bypass graft: Secondary | ICD-10-CM

## 2022-04-22 DIAGNOSIS — F17200 Nicotine dependence, unspecified, uncomplicated: Secondary | ICD-10-CM

## 2022-04-22 DIAGNOSIS — I714 Abdominal aortic aneurysm, without rupture, unspecified: Secondary | ICD-10-CM

## 2022-04-22 NOTE — Progress Notes (Signed)
[name] Date of Birth: 03/08/1961 MRN: [id number] Primary Care Provider:Sun, Charise Carwin, MD Former Cardiology Providers: Altamese Ecorse, APRN, FNP-C Primary Cardiologist: Tessa Lerner, DO, [facility] (established care 04/22/2019) Primary cardiothoracic surgery: Dr. Cliffton Asters  Date: 04/22/22 Last Office Visit: 08/10/2021  Chief Complaint  Patient presents with  . Coronary Artery Disease  . Follow-up    6 months and results    HPI  Regina Perry is a 62 y.o.  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   Patient is noted to have obstructive CAD and underwent two-vessel CABG in October 2022.  Since then she been managed medically.  He presents today for 69-month follow-up given her underlying CAD, abdominal aortic aneurysm, carotid artery disease.  Since last office visit she denies anginal discomfort or heart failure symptoms.  She has had an echocardiogram, carotid duplex, and abdominal aortic duplex results reviewed with her in detail and noted below for further reference.  Unfortunately, she continues to smoke but is making an effort with regards to complete smoking cessation.  FUNCTIONAL STATUS: She walks 3-4 miles per day.   ALLERGIES: Allergies  Allergen Reactions  . Levofloxacin Nausea Only  . Percocet [Oxycodone-Acetaminophen] Other (See Comments)    Sick on stomach when taken with anesthesia    MEDICATION LIST PRIOR TO VISIT: Current Outpatient Medications on File Prior to Visit  Medication Sig Dispense Refill  . acetaminophen (TYLENOL) 500 MG tablet Take 1,000 mg by mouth every 6 (six) hours as needed for moderate pain or headache.    . cetirizine (ZYRTEC) 10 MG tablet Take 1 tablet (10 mg total)  by mouth at bedtime. 90 tablet 1  . clopidogrel (PLAVIX) 75 MG tablet TAKE 1 TABLET EVERY DAY 90 tablet 3  . escitalopram (LEXAPRO) 20 MG tablet Take 1 tablet (20 mg total) by mouth at bedtime. 90 tablet 1  . gabapentin (NEURONTIN) 300 MG capsule Take 1 capsule (300 mg total) by mouth 3 (three) times daily. 270 capsule 1  . HYDROcodone-acetaminophen (NORCO) 10-325 MG tablet Take 1 tablet by mouth every 6 (six) hours as needed for moderate pain.    Marland Kitchen LORazepam (ATIVAN) 0.5 MG tablet Take 0.5 mg by mouth every 6 (six) hours as needed for anxiety.    Marland Kitchen losartan (COZAAR) 25 MG tablet TAKE 1 TABLET EVERY EVENING 90 tablet 3  . metoprolol succinate (TOPROL-XL) 25 MG 24 hr tablet TAKE 1 TABLET EVERY DAY 90 tablet 3  . nitroGLYCERIN (NITROSTAT) 0.4 MG SL tablet Place 1 tablet (0.4 mg total) under the tongue every 5 (five) minutes as needed for chest pain. 90 tablet 0  . pantoprazole (PROTONIX) 40 MG tablet TAKE 1 TABLET EVERY DAY (Patient taking differently: Take 40 mg by mouth 2 (two) times daily. In the AM before breakfast and one before dinner.) 90 tablet 1  . promethazine (PHENERGAN) 25 MG tablet Take 1 tablet (25 mg total) by mouth every 6 (six) hours as needed for nausea or vomiting. 90 tablet 0  . rosuvastatin (CRESTOR) 40 MG tablet Take 1 tablet (40 mg total) by mouth daily. 90 tablet 1  . SUMAtriptan (IMITREX) 100 MG tablet TAKE 1 TABLET (100 MG TOTAL) BY MOUTH EVERY 2 (TWO) HOURS AS NEEDED FOR MIGRAINE OR HEADACHE. 27 tablet 3   No current facility-administered medications on file prior  to visit.    PAST MEDICAL HISTORY: Past Medical History:  Diagnosis Date  . Allergic rhinitis   . Anxiety   . Bronchitis   . Depression   . Diverticulitis   . GERD (gastroesophageal reflux disease)   . H/O cesarean section   . History of hiatal hernia   . History of kidney stones    LEFT KIDNEY 2 STONES JUST WATCHING( [facility])  . Hypercholesteremia   . Hyperplastic colon polyp    12/30/2008  .  Hypertension   . Migraine   . 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 Yates Decamp), prescribed verapamil  . Pleurisy   . Pneumonia   . PONV (postoperative nausea and vomiting)   . 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   CORONARY ARTER(CABG) X2  LIMA to LAD and SVG TO OM1 01/09/19   . Vitamin D deficiency     PAST SURGICAL HISTORY: Past Surgical History:  Procedure Laterality Date  . ABDOMINAL HYSTERECTOMY     partial  . BREAST BIOPSY    . CESAREAN SECTION    . COLONOSCOPY    . CORONARY ARTERY BYPASS GRAFT N/A 01/09/2019   Procedure: CORONARY ARTERY BYPASS GRAFTING (CABG) X2 USING LEFT INTERNAL MAMMARY ARTERY TO CIRC AND RIGHT GREATER SAPHENOUS VEIN TO LAD.;  Surgeon: Corliss Skains, MD;  Location: [facility] OR;  Service: Open Heart Surgery;  Laterality: N/A;  . CORONARY ARTERY BYPASS GRAFT    . ETHMOIDECTOMY Bilateral 06/26/2014   Procedure: BILATERAL ANTERIOR ETHMOIDECTOMY;  Surgeon: Drema Halon, MD;  Location: [facility];  Service: ENT;  Laterality: Bilateral;  . LEFT HEART CATH AND CORONARY ANGIOGRAPHY N/A 01/08/2019   Procedure: LEFT HEART CATH AND CORONARY ANGIOGRAPHY;  Surgeon: Elder Negus, MD;  Location: [facility];  Service: Cardiovascular;  Laterality: N/A;  . MAXILLARY ANTROSTOMY Bilateral 06/26/2014   Procedure: BILATERAL MAXILLARY OSTEA ENLARGEMENT;  Surgeon: Drema Halon, MD;  Location: [facility];  Service: ENT;  Laterality: Bilateral;  . NASAL SEPTOPLASTY W/ TURBINOPLASTY Bilateral 06/26/2014   Procedure: BILATERAL NASAL SEPTOPLASTY WITH TURBINATE REDUCTION;  Surgeon: Drema Halon, MD;  Location: [facility];  Service: ENT;  Laterality: Bilateral;  . SHOULDER ARTHROSCOPY W/ ROTATOR CUFF REPAIR  08/2013   right  . TEE WITHOUT CARDIOVERSION  01/09/2019   Procedure: Transesophageal Echocardiogram (Tee);  Surgeon:  Corliss Skains, MD;  Location: [facility] OR;  Service: Open Heart Surgery;;  . TONSILLECTOMY AND ADENOIDECTOMY    . TUBAL LIGATION      FAMILY HISTORY: The patient's family history includes AAA (abdominal aortic aneurysm) in her sister; Breast cancer in her paternal grandmother; COPD in her mother; Cerebral aneurysm in her father; Emphysema in her mother; Heart disease in her mother; Irritable bowel syndrome in her mother; Lung cancer in her paternal grandfather; Migraines in her mother; Pancreatic cancer in her father.   SOCIAL HISTORY:  The patient  reports that she has been smoking cigarettes. She started smoking about 3 years ago. She has a 35.00 pack-year smoking history. She has been exposed to tobacco smoke. She has never used smokeless tobacco. She reports that she does not drink alcohol and does not use drugs.  Review of Systems  Cardiovascular:  Negative for chest pain, claudication, dyspnea on exertion, leg swelling, near-syncope, orthopnea, palpitations, paroxysmal nocturnal dyspnea and syncope.  Respiratory:  Negative for shortness of breath.  PHYSICAL EXAM:    04/22/2022    1:43 PM 04/04/2022    2:23 PM 02/21/2022    1:57 PM  Vitals with BMI  Height 5\' 3"  5\' 3"  5\' 3"   Weight 132 lbs 10 oz 131 lbs 137 lbs 3 oz  BMI 23.49 96.78 93.81  Systolic 017 510 258  Diastolic 68 82 76  Pulse 66 56 61   CONSTITUTIONAL: Well-developed and well-nourished. No acute distress.  SKIN: Skin is warm and dry. No rash noted. No cyanosis. No pallor. No jaundice HEAD: Normocephalic and atraumatic.  EYES: No scleral icterus MOUTH/THROAT: Moist oral membranes.  NECK: No JVD present. No thyromegaly noted. No carotid bruits  CHEST Normal respiratory effort. No intercostal retractions  LUNGS: Clear to auscultation bilaterally.  No stridor. No wheezes. No rales.  CARDIOVASCULAR: Regular rhythm, normal S1-S2, no murmurs rubs or gallops appreciated. ABDOMINAL: Nonobese, soft, nontender,  nondistended, positive bowel sounds in all 4 quadrants, no abdominal bruits, no apparent ascites.  EXTREMITIES: No peripheral edema, warm to touch, 2+ DP and PT pulses. HEMATOLOGIC: No significant bruising NEUROLOGIC: Oriented to person, place, and time. Nonfocal. Normal muscle tone.  PSYCHIATRIC: Normal mood and affect. Normal behavior. Cooperative  RADIOLOGY: Coronary CTA 12/25/18:  1. The left main is short and FFRct cannot be modeled in this region. 2. FFRct in the proximal LAD is mildly abnormal. However modeling demonstrates that opening the left main stenosis results in a significant improvement in flow in the proximal LAD. 3. FFRct demonstrates significant stenoses in the proximal and mid LAD. Based on modeling, opening the LM and proximal stenosis does not normalize flow more distally. 4. Recommend cardiac catheterization. Take note of concern for ostial left main disease.  CARDIAC DATABASE: Coronary artery bypass grafting: Year: 01/09/2019 Surgical anatomy: 2 vessel CABG: LIMA to LCx and SVG to LAD  EKG: 04/22/2022: Sinus Bradycardia, 58bpm, nonspecific T-abnormality.  Echocardiogram: 03/01/2022: Normal LV systolic function with visual EF 60-65%. Left ventricle cavity is normal in size. Normal left ventricular wall thickness. Normal global wall motion. Normal diastolic filling pattern, normal LAP.  Mild tricuspid regurgitation. No evidence of pulmonary hypertension. Mild pulmonic regurgitation. Abdominal aorta is dilated (12mm) with atherosclerosis. Compared to 01/09/2019: Mild TR and PR are new.   Stress Testing:  PCV MYOCARDIAL PERFUSION WITH LEXISCAN 11/25/2019 Lexiscan (Walking with mod Bruce)Tetrofosmin Stress Test  11/25/2019: Walking Lexiscan protocol. Equivocal ECG stress. Resting EKG/ECG demonstrated normal sinus rhythm. Peak EKG revealed no ST-T wave abnormalities. Recovery EKG revealed 1 mm horizontal ST depression of the inferolateral leads. Normal myocardial  perfusion. Gated SPECT imaging of the left ventricle was normal. All segments of left ventricle demonstrated normal wall motion and thickening. TID is normal. Stress LV EF is normal 63%. Compared to exercise nuclear stress in 07/20/2015, exercise EKG without ischemia and normal perfusion. Low risk. Clinical correlation recommended.  Heart Catheterization: Cath 01/08/2019:  LM: Mid calcified 90% stenosis. LAD: Mid LAD focal calfied 60% stenosis after D2 LCx: Normal RCA: Ostial calcification without significant disease   LVEF normal LVEDP mildly elevated at 21 mmHg  Carotid artery duplex 01/12/2021:  Duplex suggests stenosis in the right internal carotid artery (50-69%).  Duplex suggests stenosis in the right external carotid artery (<50%). Duplex suggests stenosis in the left internal carotid artery (16-49%). Antegrade right vertebral artery flow. Antegrade left vertebral artery flow. Compared to the study done on 06/18/2020, there is mild progression of disease on the left. Otherwise no significant change. Follow up in six months is appropriate if clinically  indicated.  Carotid artery duplex 03/01/2022: Duplex suggests stenosis in the right internal carotid artery (50-69%).  <50% stenosis in the right external carotid artery. Duplex suggests stenosis in the left internal carotid artery (1-15%). Antegrade right vertebral artery flow. Antegrade left vertebral artery flow. Compared to the study done on 08/31/2021, there is mild progression of the disease on the left. Follow up in six months is appropriate if clinically indicated.   Abdominal Aortic Duplex 08/31/2021: Moderate dilatation of the abdominal aorta is noted in the distal aorta. An abdominal aortic aneurysm measuring 3.3 x 3.1 x 3.1 cm is seen. There is ectasia noted in the proximal and mid abdominal aorta. The aorta is severely calcified throughout including common iliac arteries. Normal flow velocity in the aorta and bilateral  CIA.  No significant change from 01/12/2021. Consider reevaluation in 2-3 years for stability.   LABORATORY DATA:    Latest Ref Rng & Units 03/31/2021    9:17 AM 01/06/2021    8:21 PM 11/13/2019    3:47 PM  CBC  WBC 3.4 - 10.8 x10E3/uL 8.4  7.8  7.3   Hemoglobin 11.1 - 15.9 g/dL 62.5  63.8  93.7   Hematocrit 34.0 - 46.6 % 43.3  41.2  43.3   Platelets 150 - 450 x10E3/uL 158  175  195        Latest Ref Rng & Units 03/07/2022   10:59 AM 09/22/2021    1:58 PM 03/31/2021    9:17 AM  CMP  Glucose 70 - 99 mg/dL   99   BUN 6 - 24 mg/dL   13   Creatinine 3.42 - 1.00 mg/dL 8.76  8.11  5.72   Sodium 134 - 144 mmol/L   143   Potassium 3.5 - 5.2 mmol/L   4.5   Chloride 96 - 106 mmol/L   107   CO2 20 - 29 mmol/L   23   Calcium 8.7 - 10.2 mg/dL   9.0   Total Protein 6.0 - 8.5 g/dL   6.5   Total Bilirubin 0.0 - 1.2 mg/dL   0.4   Alkaline Phos 44 - 121 IU/L   66   AST 0 - 40 IU/L   16   ALT 0 - 32 IU/L   13     Lipid Panel  Lab Results  Component Value Date   CHOL 146 03/31/2021   HDL 57 03/31/2021   LDLCALC 70 03/31/2021   LDLDIRECT 61 03/09/2020   TRIG 103 03/31/2021   CHOLHDL 2.6 03/31/2021     Lab Results  Component Value Date   HGBA1C 5.7 (H) 03/31/2021   HGBA1C 6.0 (H) 01/08/2019   HGBA1C 5.6 04/10/2012   No components found for: "NTPROBNP" Lab Results  Component Value Date   TSH 2.730 03/31/2021    Cardiac Panel (last 3 results) No results for input(s): "CKTOTAL", "CKMB", "TROPONINIHS", "RELINDX" in the last 72 hours.  IMPRESSION:    ICD-10-CM   1. Atherosclerosis of native coronary artery of native heart without angina pectoris  I25.10 EKG 12-Lead    2. S/P CABG x 2  Z95.1     3. Abdominal aortic aneurysm (AAA) without rupture, unspecified part (HCC)  I71.40 EKG 12-Lead    4. Asymptomatic bilateral carotid artery stenosis  I65.23     5. Mixed hyperlipidemia  E78.2     6. Essential hypertension  I10     7. Tobacco use disorder  F17.200         RECOMMENDATIONS:  Regina Perry is a 62 y.o. female whose past medical history and cardiovascular risk factors include: Hypertension, hyperlipidemia, tobacco use disorder, asymptomatic nonsustained ventricular tachycardia noted on event monitor in 2017, atherosclerosis of the native coronary arteries status post two-vessel CABG 12/2018.   Atherosclerosis of native coronary artery of native heart without angina pectoris  S/P CABG x 2 ***  Abdominal aortic aneurysm (AAA) without rupture, unspecified part (HCC) ***  Asymptomatic bilateral carotid artery stenosis ***  Mixed hyperlipidemia ***  Essential hypertension ***  Tobacco use disorder ***  FINAL MEDICATION LIST END OF ENCOUNTER: No orders of the defined types were placed in this encounter.   Current Outpatient Medications:  .  acetaminophen (TYLENOL) 500 MG tablet, Take 1,000 mg by mouth every 6 (six) hours as needed for moderate pain or headache., Disp: , Rfl:  .  cetirizine (ZYRTEC) 10 MG tablet, Take 1 tablet (10 mg total) by mouth at bedtime., Disp: 90 tablet, Rfl: 1 .  clopidogrel (PLAVIX) 75 MG tablet, TAKE 1 TABLET EVERY DAY, Disp: 90 tablet, Rfl: 3 .  escitalopram (LEXAPRO) 20 MG tablet, Take 1 tablet (20 mg total) by mouth at bedtime., Disp: 90 tablet, Rfl: 1 .  gabapentin (NEURONTIN) 300 MG capsule, Take 1 capsule (300 mg total) by mouth 3 (three) times daily., Disp: 270 capsule, Rfl: 1 .  HYDROcodone-acetaminophen (NORCO) 10-325 MG tablet, Take 1 tablet by mouth every 6 (six) hours as needed for moderate pain., Disp: , Rfl:  .  LORazepam (ATIVAN) 0.5 MG tablet, Take 0.5 mg by mouth every 6 (six) hours as needed for anxiety., Disp: , Rfl:  .  losartan (COZAAR) 25 MG tablet, TAKE 1 TABLET EVERY EVENING, Disp: 90 tablet, Rfl: 3 .  metoprolol succinate (TOPROL-XL) 25 MG 24 hr tablet, TAKE 1 TABLET EVERY DAY, Disp: 90 tablet, Rfl: 3 .  nitroGLYCERIN (NITROSTAT) 0.4 MG SL tablet, Place 1 tablet (0.4 mg total) under the  tongue every 5 (five) minutes as needed for chest pain., Disp: 90 tablet, Rfl: 0 .  pantoprazole (PROTONIX) 40 MG tablet, TAKE 1 TABLET EVERY DAY (Patient taking differently: Take 40 mg by mouth 2 (two) times daily. In the AM before breakfast and one before dinner.), Disp: 90 tablet, Rfl: 1 .  promethazine (PHENERGAN) 25 MG tablet, Take 1 tablet (25 mg total) by mouth every 6 (six) hours as needed for nausea or vomiting., Disp: 90 tablet, Rfl: 0 .  rosuvastatin (CRESTOR) 40 MG tablet, Take 1 tablet (40 mg total) by mouth daily., Disp: 90 tablet, Rfl: 1 .  SUMAtriptan (IMITREX) 100 MG tablet, TAKE 1 TABLET (100 MG TOTAL) BY MOUTH EVERY 2 (TWO) HOURS AS NEEDED FOR MIGRAINE OR HEADACHE., Disp: 27 tablet, Rfl: 3  Orders Placed This Encounter  Procedures  . EKG 12-Lead   --Continue cardiac medications as reconciled in final medication list. --No follow-ups on file. Or sooner if needed. --Continue follow-up with your primary care physician regarding the management of your other chronic comorbid conditions.  Patient's questions and concerns were addressed to her satisfaction. She voices understanding of the instructions provided during this encounter.   This note was created using a voice recognition software as a result there may be grammatical errors inadvertently enclosed that do not reflect the nature of this encounter. Every attempt is made to correct such errors.  Total time spent: 35 minutes.  Rex Kras, [place], [facility]  Pager: [phone] Office: [phone]

## 2022-04-23 ENCOUNTER — Encounter: Payer: Self-pay | Admitting: Cardiology

## 2022-04-25 ENCOUNTER — Encounter: Payer: Self-pay | Admitting: Cardiology

## 2022-04-29 ENCOUNTER — Telehealth: Payer: Self-pay

## 2022-04-29 NOTE — Telephone Encounter (Signed)
Spoke with patient about surgical clearance. Made patient aware she should discontinue Clopidogrel 5 days prior to the procedure. She accepted risks, and had no further questions.

## 2022-07-06 ENCOUNTER — Encounter: Payer: Self-pay | Admitting: Gastroenterology

## 2022-07-07 ENCOUNTER — Encounter
Admission: RE | Disposition: A | Discharge status: Home / Self Care | Payer: Self-pay | Source: Home / Self Care | Attending: Gastroenterology

## 2022-07-07 ENCOUNTER — Ambulatory Visit: Payer: Medicare HMO | Admitting: Anesthesiology

## 2022-07-07 ENCOUNTER — Encounter: Payer: Self-pay | Admitting: Gastroenterology

## 2022-07-07 ENCOUNTER — Ambulatory Visit
Admission: RE | Admit: 2022-07-07 | Discharge: 2022-07-07 | Disposition: A | Discharge status: Home / Self Care | Payer: Medicare HMO | Attending: Gastroenterology | Admitting: Gastroenterology

## 2022-07-07 DIAGNOSIS — D128 Benign neoplasm of rectum: Secondary | ICD-10-CM | POA: Insufficient documentation

## 2022-07-07 DIAGNOSIS — K219 Gastro-esophageal reflux disease without esophagitis: Secondary | ICD-10-CM | POA: Diagnosis not present

## 2022-07-07 DIAGNOSIS — F419 Anxiety disorder, unspecified: Secondary | ICD-10-CM | POA: Diagnosis not present

## 2022-07-07 DIAGNOSIS — F32A Depression, unspecified: Secondary | ICD-10-CM | POA: Insufficient documentation

## 2022-07-07 DIAGNOSIS — I10 Essential (primary) hypertension: Secondary | ICD-10-CM | POA: Insufficient documentation

## 2022-07-07 DIAGNOSIS — Z1211 Encounter for screening for malignant neoplasm of colon: Secondary | ICD-10-CM | POA: Insufficient documentation

## 2022-07-07 DIAGNOSIS — Z951 Presence of aortocoronary bypass graft: Secondary | ICD-10-CM | POA: Insufficient documentation

## 2022-07-07 DIAGNOSIS — D120 Benign neoplasm of cecum: Secondary | ICD-10-CM | POA: Diagnosis not present

## 2022-07-07 DIAGNOSIS — D123 Benign neoplasm of transverse colon: Secondary | ICD-10-CM | POA: Diagnosis not present

## 2022-07-07 DIAGNOSIS — R519 Headache, unspecified: Secondary | ICD-10-CM | POA: Insufficient documentation

## 2022-07-07 DIAGNOSIS — R110 Nausea: Secondary | ICD-10-CM | POA: Insufficient documentation

## 2022-07-07 DIAGNOSIS — K5909 Other constipation: Secondary | ICD-10-CM | POA: Diagnosis not present

## 2022-07-07 DIAGNOSIS — I2510 Atherosclerotic heart disease of native coronary artery without angina pectoris: Secondary | ICD-10-CM | POA: Insufficient documentation

## 2022-07-07 DIAGNOSIS — Z86010 Personal history of colonic polyps: Secondary | ICD-10-CM | POA: Insufficient documentation

## 2022-07-07 DIAGNOSIS — R1013 Epigastric pain: Secondary | ICD-10-CM | POA: Insufficient documentation

## 2022-07-07 DIAGNOSIS — G8929 Other chronic pain: Secondary | ICD-10-CM | POA: Diagnosis not present

## 2022-07-07 DIAGNOSIS — F17200 Nicotine dependence, unspecified, uncomplicated: Secondary | ICD-10-CM | POA: Insufficient documentation

## 2022-07-07 DIAGNOSIS — K449 Diaphragmatic hernia without obstruction or gangrene: Secondary | ICD-10-CM | POA: Insufficient documentation

## 2022-07-07 DIAGNOSIS — Z7902 Long term (current) use of antithrombotics/antiplatelets: Secondary | ICD-10-CM | POA: Insufficient documentation

## 2022-07-07 DIAGNOSIS — K5730 Diverticulosis of large intestine without perforation or abscess without bleeding: Secondary | ICD-10-CM | POA: Diagnosis not present

## 2022-07-07 HISTORY — PX: COLONOSCOPY: SHX5424

## 2022-07-07 HISTORY — PX: ESOPHAGOGASTRODUODENOSCOPY: SHX5428

## 2022-07-07 SURGERY — COLONOSCOPY
Anesthesia: General

## 2022-07-07 MED ORDER — LIDOCAINE HCL (CARDIAC) PF 100 MG/5ML IV SOSY
PREFILLED_SYRINGE | INTRAVENOUS | Status: DC | PRN
  Administered 2022-07-07: 80 mg via INTRAVENOUS

## 2022-07-07 MED ORDER — SODIUM CHLORIDE 0.9 % IV SOLN
INTRAVENOUS | Status: DC
  Administered 2022-07-07: 1000 mL via INTRAVENOUS

## 2022-07-07 MED ORDER — GLYCOPYRROLATE 0.2 MG/ML IJ SOLN
INTRAMUSCULAR | Status: AC
  Filled 2022-07-07: qty 1

## 2022-07-07 MED ORDER — LIDOCAINE HCL (PF) 2 % IJ SOLN
INTRAMUSCULAR | Status: AC
  Filled 2022-07-07: qty 5

## 2022-07-07 MED ORDER — PROPOFOL 10 MG/ML IV BOLUS
INTRAVENOUS | Status: DC | PRN
  Administered 2022-07-07: 30 mg via INTRAVENOUS
  Administered 2022-07-07: 40 mg via INTRAVENOUS
  Administered 2022-07-07: 20 mg via INTRAVENOUS
  Administered 2022-07-07: 30 mg via INTRAVENOUS
  Administered 2022-07-07: 80 mg via INTRAVENOUS

## 2022-07-07 MED ORDER — PROPOFOL 500 MG/50ML IV EMUL
INTRAVENOUS | Status: DC | PRN
  Administered 2022-07-07: 125 ug/kg/min via INTRAVENOUS

## 2022-07-07 MED ORDER — PROPOFOL 10 MG/ML IV BOLUS
INTRAVENOUS | Status: AC
  Filled 2022-07-07: qty 20

## 2022-07-07 MED ORDER — GLYCOPYRROLATE 0.2 MG/ML IJ SOLN
INTRAMUSCULAR | Status: DC | PRN
  Administered 2022-07-07: 200 mg via INTRAVENOUS
  Administered 2022-07-07: .2 mg via INTRAVENOUS

## 2022-07-07 NOTE — Anesthesia Preprocedure Evaluation (Signed)
Anesthesia Evaluation  Patient identified by MRN, date of birth, ID band Patient awake    Reviewed: Allergy & Precautions, NPO status , Patient's Chart, lab work & pertinent test results  History of Anesthesia Complications (+) PONV and history of anesthetic complications  Airway Mallampati: II  TM Distance: >3 FB Neck ROM: Full    Dental no notable dental hx. (+) Dental Advisory Given, Teeth Intact   Pulmonary neg pulmonary ROS, neg sleep apnea, neg COPD, Current SmokerPatient did not abstain from smoking.   Pulmonary exam normal breath sounds clear to auscultation       Cardiovascular Exercise Tolerance: Good METShypertension, Pt. on medications and Pt. on home beta blockers (-) angina + CAD and + CABG  (-) Past MI Normal cardiovascular exam(-) dysrhythmias  Rhythm:Regular Rate:Normal - Systolic murmurs  '20 TTE - pending  '20 Carotid US - (prelim read) - Right Carotid: Velocities in the right ICA are consistent with a 40-59% stenosis. Left Carotid: Velocities in the left ICA are consistent with a 1-39% stenosis  '20 Cath - LM: Mid calcified 90% stenosis. LAD: Mid LAD focal calfied 60% stenosis after D2 LCx: Normal RCA: Ostial calcification without significant disease LVEF normal. LVEDP mildly elevated at 21 mmHg    Neuro/Psych  Headaches PSYCHIATRIC DISORDERS Anxiety Depression       GI/Hepatic hiatal hernia,GERD  Medicated and Controlled,,(+)     (-) substance abuse    Endo/Other  neg diabetes    Renal/GU negative Renal ROS     Musculoskeletal   Abdominal   Peds  Hematology   Anesthesia Other Findings Past Medical History: No date: Allergic rhinitis No date: Anxiety No date: Bronchitis No date: Depression No date: Diverticulitis No date: GERD (gastroesophageal reflux disease) No date: H/O cesarean section No date: History of hiatal hernia No date: History of kidney stones     Comment:  LEFT  KIDNEY 2 STONES JUST WATCHING( [facility]) No date: Hypercholesteremia No date: Hyperplastic colon polyp     Comment:  12/30/2008 No date: Hypertension No date: Migraine No date: Palpitations     Comment:  asymptomatic 4 and 3 beat NSVT 06/2015 (normal stress and              echo), 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               Yates Decamp), prescribed verapamil No date: Pleurisy No date: Pneumonia No date: PONV (postoperative nausea and vomiting) 01/09/2019: S/P CABG x 2     Comment:  CORONARY ARTER(CABG) X2  LIMA to LAD and SVG TO OM1               01/09/19  No date: Vitamin D deficiency  Reproductive/Obstetrics  S/p hysterectomy                               Anesthesia Physical Anesthesia Plan  ASA: 3  Anesthesia Plan: General   Post-op Pain Management: Minimal or no pain anticipated   Induction: Intravenous  PONV Risk Score and Plan: 3 and Propofol infusion, TIVA and Ondansetron  Airway Management Planned: Nasal Cannula  Additional Equipment: None  Intra-op Plan:   Post-operative Plan:   Informed Consent: I have reviewed the patients History and Physical, chart, labs and discussed the procedure including the risks, benefits and alternatives for the proposed anesthesia with the patient or authorized representative who has indicated his/her understanding and acceptance.  Dental advisory given  Plan Discussed with: CRNA and Surgeon  Anesthesia Plan Comments: (Discussed risks of anesthesia with patient, including possibility of difficulty with spontaneous ventilation under anesthesia necessitating airway intervention, PONV, and rare risks such as cardiac or respiratory or neurological events, and allergic reactions. Discussed the role of CRNA in patient's perioperative care. Patient understands. Patient counseled on benefits of smoking cessation, and increased perioperative risks associated with continued smoking. )          Anesthesia Quick Evaluation

## 2022-07-07 NOTE — H&P (Signed)
Pre-Procedure H&P   Patient ID: Regina Perry is a 62 y.o. female.  Gastroenterology Provider: Jaynie CollinsSteven Michael Wyman Meschke, DO  Referring Provider: Jacob MooresMason Croley, PA PCP: Deatra JamesSun, Vyvyan, MD  Date: 07/07/2022  HPI Ms. Regina SanesLisa J Reason is a 62 y.o. female who presents today for Esophagogastroduodenoscopy and Colonoscopy for gerd Increasing symptoms, nausea, dyspepsia, chronic abdominal pain, personal history colon polyps .  Patient has had persistent nausea and GERD for some time, but she believes that this is becoming worse.  She was taking her PPI 1 hour post dinner.  She has now corrected this to take it approximately 30 minutes prior.  She notes regurgitation and retrosternal burning when she has her reflux symptoms.  The symptoms increase with increased stress.  She has been using Pepto with some relief.  No odynophagia  Esophagram performed with normal GEJ, but demonstrating reflux.  No dysmotility noted.  She has a personal history of polyps with last colonoscopy being performed in 2014.  She deals with chronic constipation.  Denies melena or hematochezia  Status post hysterectomy and C-section  She has a history of CABG x 2 and is on Plavix, however, this has been held for this procedure today (last dose 06/30/22)   Past Medical History:  Diagnosis Date   Allergic rhinitis    Anxiety    Bronchitis    Depression    Diverticulitis    GERD (gastroesophageal reflux disease)    H/O cesarean section    History of hiatal hernia    History of kidney stones    LEFT KIDNEY 2 STONES JUST WATCHING( [facility])   Hypercholesteremia    Hyperplastic colon polyp    12/30/2008   Hypertension    Migraine    Palpitations    asymptomatic 4 and 3 beat NSVT 06/2015 (normal stress and echo), 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   CORONARY ARTER(CABG) X2  LIMA to  LAD and SVG TO OM1 01/09/19    Vitamin D deficiency     Past Surgical History:  Procedure Laterality Date   ABDOMINAL HYSTERECTOMY     partial   BREAST BIOPSY     CESAREAN SECTION     COLONOSCOPY     CORONARY ANGIOPLASTY     CORONARY ARTERY BYPASS GRAFT N/A 01/09/2019   Procedure: CORONARY ARTERY BYPASS GRAFTING (CABG) X2 USING LEFT INTERNAL MAMMARY ARTERY TO CIRC AND RIGHT GREATER SAPHENOUS VEIN TO LAD.;  Surgeon: Corliss SkainsLightfoot, Harrell O, MD;  Location: [facility] OR;  Service: Open Heart Surgery;  Laterality: N/A;   CORONARY ARTERY BYPASS GRAFT     ETHMOIDECTOMY Bilateral 06/26/2014   Procedure: BILATERAL ANTERIOR ETHMOIDECTOMY;  Surgeon: Drema Halonhristopher E Newman, MD;  Location: [facility];  Service: ENT;  Laterality: Bilateral;   LEFT HEART CATH AND CORONARY ANGIOGRAPHY N/A 01/08/2019   Procedure: LEFT HEART CATH AND CORONARY ANGIOGRAPHY;  Surgeon: Elder NegusPatwardhan, Manish J, MD;  Location: [facility];  Service: Cardiovascular;  Laterality: N/A;   MAXILLARY ANTROSTOMY Bilateral 06/26/2014   Procedure: BILATERAL MAXILLARY OSTEA ENLARGEMENT;  Surgeon: Drema Halonhristopher E Newman, MD;  Location: [facility];  Service: ENT;  Laterality: Bilateral;   NASAL SEPTOPLASTY W/ TURBINOPLASTY Bilateral 06/26/2014   Procedure: BILATERAL NASAL SEPTOPLASTY WITH TURBINATE REDUCTION;  Surgeon: Drema Halonhristopher E Newman, MD;  Location: [facility];  Service: ENT;  Laterality: Bilateral;   SHOULDER ARTHROSCOPY W/ ROTATOR CUFF REPAIR  08/2013   right   TEE WITHOUT CARDIOVERSION  01/09/2019   Procedure: Transesophageal Echocardiogram (Tee);  Surgeon: Corliss Skains, MD;  Location: [facility] OR;  Service: Open Heart Surgery;;   TONSILLECTOMY AND ADENOIDECTOMY     TUBAL LIGATION      Family History Father- pancreatic cancer  No other h/o GI disease or malignancy  Review of Systems  Constitutional:  Negative for activity change, appetite change, chills, diaphoresis, fatigue, fever and  unexpected weight change.  HENT:  Negative for trouble swallowing and voice change.   Respiratory:  Negative for shortness of breath and wheezing.   Cardiovascular:  Negative for chest pain, palpitations and leg swelling.  Gastrointestinal:  Positive for abdominal pain, constipation and nausea. Negative for abdominal distention, anal bleeding, blood in stool, diarrhea, rectal pain and vomiting.  Musculoskeletal:  Negative for arthralgias and myalgias.  Skin:  Negative for color change and pallor.  Neurological:  Negative for dizziness, syncope and weakness.  Psychiatric/Behavioral:  Negative for confusion.   All other systems reviewed and are negative.    Medications No current facility-administered medications on file prior to encounter.   Current Outpatient Medications on File Prior to Encounter  Medication Sig Dispense Refill   cetirizine (ZYRTEC) 10 MG tablet Take 1 tablet (10 mg total) by mouth at bedtime. 90 tablet 1   escitalopram (LEXAPRO) 20 MG tablet Take 1 tablet (20 mg total) by mouth at bedtime. 90 tablet 1   gabapentin (NEURONTIN) 300 MG capsule Take 1 capsule (300 mg total) by mouth 3 (three) times daily. 270 capsule 1   HYDROcodone-acetaminophen (NORCO) 10-325 MG tablet Take 1 tablet by mouth every 6 (six) hours as needed for moderate pain.     losartan (COZAAR) 25 MG tablet TAKE 1 TABLET EVERY EVENING 90 tablet 3   metoprolol succinate (TOPROL-XL) 25 MG 24 hr tablet TAKE 1 TABLET EVERY DAY 90 tablet 3   pantoprazole (PROTONIX) 40 MG tablet TAKE 1 TABLET EVERY DAY (Patient taking differently: Take 40 mg by mouth 2 (two) times daily. In the AM before breakfast and one before dinner.) 90 tablet 1   acetaminophen (TYLENOL) 500 MG tablet Take 1,000 mg by mouth every 6 (six) hours as needed for moderate pain or headache.     clopidogrel (PLAVIX) 75 MG tablet TAKE 1 TABLET EVERY DAY 90 tablet 3   nitroGLYCERIN (NITROSTAT) 0.4 MG SL tablet Place 1 tablet (0.4 mg total) under the  tongue every 5 (five) minutes as needed for chest pain. 90 tablet 0   promethazine (PHENERGAN) 25 MG tablet Take 1 tablet (25 mg total) by mouth every 6 (six) hours as needed for nausea or vomiting. 90 tablet 0   rosuvastatin (CRESTOR) 40 MG tablet Take 1 tablet (40 mg total) by mouth daily. 90 tablet 1   SUMAtriptan (IMITREX) 100 MG tablet TAKE 1 TABLET (100 MG TOTAL) BY MOUTH EVERY 2 (TWO) HOURS AS NEEDED FOR MIGRAINE OR HEADACHE. 27 tablet 3    Pertinent medications related to GI and procedure were reviewed by me with the patient prior to the procedure   Current Facility-Administered Medications:    0.9 %  sodium chloride infusion, , Intravenous, Continuous, Jaynie Collins, DO, Last Rate: 20 mL/hr at 07/07/22 1221, 1,000 mL at 07/07/22 1221      Allergies  Allergen Reactions   Levofloxacin Nausea Only   Percocet [Oxycodone-Acetaminophen] Other (See Comments)    Sick on stomach  when taken with anesthesia    Allergies were reviewed by me prior to the procedure  Objective   Body mass index is 23.61 kg/m. Vitals:   07/07/22 1204  BP: (!) 146/82  Pulse: (!) 55  Resp: 18  Temp: (!) 97.2 F (36.2 C)  TempSrc: Temporal  SpO2: 99%  Weight: 60.5 kg  Height: 5\' 3"  (1.6 m)     Physical Exam Vitals and nursing note reviewed.  Constitutional:      General: She is not in acute distress.    Appearance: Normal appearance. She is not ill-appearing, toxic-appearing or diaphoretic.  HENT:     Head: Normocephalic and atraumatic.     Nose: Nose normal.     Mouth/Throat:     Mouth: Mucous membranes are moist.     Pharynx: Oropharynx is clear.  Eyes:     General: No scleral icterus.    Extraocular Movements: Extraocular movements intact.  Cardiovascular:     Rate and Rhythm: Regular rhythm. Bradycardia present.     Heart sounds: Normal heart sounds. No murmur heard.    No friction rub. No gallop.  Pulmonary:     Effort: Pulmonary effort is normal. No respiratory distress.      Breath sounds: Normal breath sounds. No wheezing, rhonchi or rales.  Abdominal:     General: Bowel sounds are normal. There is no distension.     Palpations: Abdomen is soft.     Tenderness: There is no abdominal tenderness. There is no guarding or rebound.  Musculoskeletal:     Cervical back: Neck supple.     Right lower leg: No edema.     Left lower leg: No edema.  Skin:    General: Skin is warm and dry.     Coloration: Skin is not jaundiced or pale.  Neurological:     General: No focal deficit present.     Mental Status: She is alert and oriented to person, place, and time. Mental status is at baseline.  Psychiatric:        Mood and Affect: Mood normal.        Behavior: Behavior normal.        Thought Content: Thought content normal.        Judgment: Judgment normal.      Assessment:  Regina Perry is a 62 y.o. female  who presents today for Esophagogastroduodenoscopy and Colonoscopy for gerd, nausea, dyspepsia, abd pain, phx colon polyps.  Plan:  Esophagogastroduodenoscopy and Colonoscopy with possible intervention today  Esophagogastroduodenoscopy and Colonoscopy with possible biopsy, control of bleeding, polypectomy, and interventions as necessary has been discussed with the patient/patient representative. Informed consent was obtained from the patient/patient representative after explaining the indication, nature, and risks of the procedure including but not limited to death, bleeding, perforation, missed neoplasm/lesions, cardiorespiratory compromise, and reaction to medications. Opportunity for questions was given and appropriate answers were provided. Patient/patient representative has verbalized understanding is amenable to undergoing the procedure.   Jaynie Collins, DO  [facility]  Portions of the record may have been created with voice recognition software. Occasional wrong-word or 'sound-a-like' substitutions may have occurred due  to the inherent limitations of voice recognition software.  Read the chart carefully and recognize, using context, where substitutions may have occurred.

## 2022-07-07 NOTE — Op Note (Signed)
[facility] Patient Name: Regina Perry Procedure Date: 07/07/2022 12:58 PM MRN: [id number] Account #: [id number] Date of Birth: 04-22-1960 Admit Type: Outpatient Age: 62 Room: [facility] ENDO ROOM 1 Gender: Female Note Status: Finalized Instrument Name: Peds Colonoscope [id number] Procedure:             Colonoscopy Indications:           High risk colon cancer surveillance: Personal history                         of colonic polyps Providers:             Trenda Moots, DO Referring MD:          Deatra James (Referring MD) Medicines:             Monitored Anesthesia Care Complications:         No immediate complications. Estimated blood loss:                         Minimal. Procedure:             Pre-Anesthesia Assessment:                        - Prior to the procedure, a History and Physical was                         performed, and patient medications and allergies were                         reviewed. The patient is competent. The risks and                         benefits of the procedure and the sedation options and                         risks were discussed with the patient. All questions                         were answered and informed consent was obtained.                         Patient identification and proposed procedure were                         verified by the physician, the nurse, the anesthetist                         and the technician in the endoscopy suite. Mental                         Status Examination: alert and oriented. Airway                         Examination: normal oropharyngeal airway and neck                         mobility. Respiratory Examination: clear to  auscultation. CV Examination: RRR, no murmurs, no S3                         or S4. Prophylactic Antibiotics: The patient does not                         require prophylactic antibiotics. Prior                          Anticoagulants: The patient has taken Plavix                         (clopidogrel), last dose was 5 days prior to                         procedure. ASA Grade Assessment: III - A patient with                         severe systemic disease. After reviewing the risks and                         benefits, the patient was deemed in satisfactory                         condition to undergo the procedure. The anesthesia                         plan was to use monitored anesthesia care (MAC).                         Immediately prior to administration of medications,                         the patient was re-assessed for adequacy to receive                         sedatives. The heart rate, respiratory rate, oxygen                         saturations, blood pressure, adequacy of pulmonary                         ventilation, and response to care were monitored                         throughout the procedure. The physical status of the                         patient was re-assessed after the procedure.                        After obtaining informed consent, the colonoscope was                         passed under direct vision. Throughout the procedure,                         the patient's blood pressure, pulse, and oxygen  saturations were monitored continuously. The                         Colonoscope was introduced through the anus and                         advanced to the the terminal ileum, with                         identification of the appendiceal orifice and IC                         valve. The colonoscopy was performed without                         difficulty. The patient tolerated the procedure well.                         The quality of the bowel preparation was evaluated                         using the BBPS [facility] Bowel Preparation Scale) with                         scores of: Right Colon = 2 (minor amount of residual                          staining, small fragments of stool and/or opaque                         liquid, but mucosa seen well), Transverse Colon = 2                         (minor amount of residual staining, small fragments of                         stool and/or opaque liquid, but mucosa seen well) and                         Left Colon = 2 (minor amount of residual staining,                         small fragments of stool and/or opaque liquid, but                         mucosa seen well). The total BBPS score equals 6. The                         quality of the bowel preparation was good. The                         terminal ileum, ileocecal valve, appendiceal orifice,                         and rectum were photographed. Findings:      The perianal and digital rectal examinations were normal. Pertinent       negatives include normal  sphincter tone.      The terminal ileum appeared normal. Estimated blood loss: none.      Retroflexion in the right colon was performed.      Multiple small-mouthed diverticula were found in the recto-sigmoid colon       and sigmoid colon. Estimated blood loss: none.      Two sessile polyps were found in the transverse colon and cecum. The       polyps were 4 to 6 mm in size. These polyps were removed with a cold       snare. Resection and retrieval were complete. Estimated blood loss was       minimal. Estimated blood loss: none.      Three sessile polyps were found in the rectum and cecum (2). The polyps       were 1 to 2 mm in size. These polyps were removed with a jumbo cold       forceps. Resection and retrieval were complete. Estimated blood loss was       minimal.      The exam was otherwise without abnormality on direct and retroflexion       views. Impression:            - The examined portion of the ileum was normal.                        - Diverticulosis in the recto-sigmoid colon and in the                         sigmoid colon.                        - Two 4 to  6 mm polyps in the transverse colon and in                         the cecum, removed with a cold snare. Resected and                         retrieved.                        - Three 1 to 2 mm polyps in the rectum and in the                         cecum, removed with a jumbo cold forceps. Resected and                         retrieved.                        - The examination was otherwise normal on direct and                         retroflexion views. Recommendation:        - Patient has a contact number available for                         emergencies. The signs and symptoms of potential  delayed complications were discussed with the patient.                         Return to normal activities tomorrow. Written                         discharge instructions were provided to the patient.                        - Discharge patient to home.                        - Resume previous diet.                        - Continue present medications.                        - No ibuprofen, naproxen, or other non-steroidal                         anti-inflammatory drugs for 5 days after polyp removal.                        - Await pathology results.                        - Repeat colonoscopy for surveillance based on                         pathology results.                        - Return to GI office as previously scheduled.                        - The findings and recommendations were discussed with                         the patient.                        - Resume Plavix (clopidogrel) at prior dose in 2 days.                         Refer to managing physician for further adjustment of                         therapy. Procedure Code(s):     --- Professional ---                        [phone], Colonoscopy, flexible; with removal of                         tumor(s), polyp(s), or other lesion(s) by snare                         technique                        45380,  59, Colonoscopy, flexible; with biopsy, single  or multiple Diagnosis Code(s):     --- Professional ---                        Z86.010, Personal history of colonic polyps                        D12.3, Benign neoplasm of transverse colon (hepatic                         flexure or splenic flexure)                        D12.8, Benign neoplasm of rectum                        D12.0, Benign neoplasm of cecum                        K57.30, Diverticulosis of large intestine without                         perforation or abscess without bleeding CPT copyright 2022 American Medical Association. All rights reserved. The codes documented in this report are preliminary and upon coder review may  be revised to meet current compliance requirements. Attending Participation:      I personally performed the entire procedure. Elfredia NevinsSteven Cereniti Curb, DO Jaynie CollinsSteven Michael Eulanda Dorion DO, DO 07/07/2022 1:56:41 PM This report has been signed electronically. Number of Addenda: 0 Note Initiated On: 07/07/2022 12:58 PM Scope Withdrawal Time: 0 hours 24 minutes 25 seconds  Total Procedure Duration: 0 hours 32 minutes 7 seconds  Estimated Blood Loss:  Estimated blood loss was minimal.      [facility]

## 2022-07-07 NOTE — Interval H&P Note (Signed)
History and Physical Interval Note: Preprocedure H&P from 07/07/22  was reviewed and there was no interval change after seeing and examining the patient.  Written consent was obtained from the patient after discussion of risks, benefits, and alternatives. Patient has consented to proceed with Esophagogastroduodenoscopy and Colonoscopy with possible intervention   07/07/2022 12:54 PM  Regina Perry  has presented today for surgery, with the diagnosis of Gastroesophageal reflux disease, unspecified whether esophagitis present (K21.9) Dyspepsia (R10.13) Chronic abdominal pain, unspecified (R10.9,G89.29) Personal history of colonic polyps (Z86.010) Hiatal hernia (K44.9).  The various methods of treatment have been discussed with the patient and family. After consideration of risks, benefits and other options for treatment, the patient has consented to  Procedure(s): COLONOSCOPY (N/A) ESOPHAGOGASTRODUODENOSCOPY (EGD) (N/A) as a surgical intervention.  The patient's history has been reviewed, patient examined, no change in status, stable for surgery.  I have reviewed the patient's chart and labs.  Questions were answered to the patient's satisfaction.     Jaynie Collins

## 2022-07-07 NOTE — Anesthesia Postprocedure Evaluation (Signed)
Anesthesia Post Note  Patient: Regina Perry  Procedure(s) Performed: COLONOSCOPY ESOPHAGOGASTRODUODENOSCOPY (EGD)  Patient location during evaluation: Endoscopy Anesthesia Type: General Level of consciousness: awake and alert Pain management: pain level controlled Vital Signs Assessment: post-procedure vital signs reviewed and stable Respiratory status: spontaneous breathing, nonlabored ventilation, respiratory function stable and patient connected to nasal cannula oxygen Cardiovascular status: blood pressure returned to baseline and stable Postop Assessment: no apparent nausea or vomiting Anesthetic complications: no   No notable events documented.   Last Vitals:  Vitals:   07/07/22 1355 07/07/22 1405  BP: 115/61 115/68  Pulse: (!) 51 (!) 51  Resp: 15 16  Temp: (!) 36.1 C   SpO2: 100% 96%    Last Pain:  Vitals:   07/07/22 1405  TempSrc:   PainSc: 0-No pain                 Corinda Gubler

## 2022-07-07 NOTE — Transfer of Care (Signed)
Immediate Anesthesia Transfer of Care Note  Patient: Regina Perry  Procedure(s) Performed: COLONOSCOPY ESOPHAGOGASTRODUODENOSCOPY (EGD)  Patient Location: PACU  Anesthesia Type:General  Level of Consciousness: drowsy  Airway & Oxygen Therapy: Patient Spontanous Breathing  Post-op Assessment: Report given to RN and Post -op Vital signs reviewed and stable  Post vital signs: Reviewed and stable  Last Vitals:  Vitals Value Taken Time  BP 115/61 07/07/22 1356  Temp 97.0   Pulse 49 07/07/22 1356  Resp 16 07/07/22 1356  SpO2 99 % 07/07/22 1356  Vitals shown include unvalidated device data.  Last Pain:  Vitals:   07/07/22 1204  TempSrc: Temporal  PainSc: 0-No pain         Complications: No notable events documented.

## 2022-07-07 NOTE — Op Note (Signed)
[facility] Gastroenterology Patient Name: Regina Perry Procedure Date: 07/07/2022 12:59 PM MRN: [id number] Account #: [id number] Date of Birth: 09-09-1960 Admit Type: Outpatient Age: 62 Room: [facility] ENDO ROOM 1 Gender: Female Note Status: Finalized Instrument Name: Upper Endoscope [id number] Procedure:             Upper GI endoscopy Indications:           Dyspepsia Providers:             Jaynie Collins DO, DO Referring MD:          Deatra James (Referring MD) Medicines:             Monitored Anesthesia Care Complications:         No immediate complications. Estimated blood loss:                         Minimal. Procedure:             Pre-Anesthesia Assessment:                        - Prior to the procedure, a History and Physical was                         performed, and patient medications and allergies were                         reviewed. The patient is competent. The risks and                         benefits of the procedure and the sedation options and                         risks were discussed with the patient. All questions                         were answered and informed consent was obtained.                         Patient identification and proposed procedure were                         verified by the physician, the nurse, the anesthetist                         and the technician in the endoscopy suite. Mental                         Status Examination: alert and oriented. Airway                         Examination: normal oropharyngeal airway and neck                         mobility. Respiratory Examination: clear to                         auscultation. CV Examination: RRR, no murmurs, no S3  or S4. Prophylactic Antibiotics: The patient does not                         require prophylactic antibiotics. Prior                         Anticoagulants: The patient has taken no anticoagulant                         or  antiplatelet agents. ASA Grade Assessment: III - A                         patient with severe systemic disease. After reviewing                         the risks and benefits, the patient was deemed in                         satisfactory condition to undergo the procedure. The                         anesthesia plan was to use monitored anesthesia care                         (MAC). Immediately prior to administration of                         medications, the patient was re-assessed for adequacy                         to receive sedatives. The heart rate, respiratory                         rate, oxygen saturations, blood pressure, adequacy of                         pulmonary ventilation, and response to care were                         monitored throughout the procedure. The physical                         status of the patient was re-assessed after the                         procedure.                        After obtaining informed consent, the endoscope was                         passed under direct vision. Throughout the procedure,                         the patient's blood pressure, pulse, and oxygen                         saturations were monitored continuously. The Endoscope  was introduced through the mouth, and advanced to the                         second part of duodenum. The upper GI endoscopy was                         accomplished without difficulty. The patient tolerated                         the procedure well. Findings:      The duodenal bulb, first portion of the duodenum and second portion of       the duodenum were normal. Biopsies for histology were taken with a cold       forceps for evaluation of celiac disease. Estimated blood loss was       minimal.      The entire examined stomach was normal. Biopsies were taken with a cold       forceps for Helicobacter pylori testing. Estimated blood loss was       minimal.      A small  hiatal hernia was present. Estimated blood loss: none.      Esophagogastric landmarks were identified: the gastroesophageal junction       was found at 35 cm from the incisors.      The Z-line was regular. Estimated blood loss: none.      The exam of the esophagus was otherwise normal. Impression:            - Normal duodenal bulb, first portion of the duodenum                         and second portion of the duodenum. Biopsied.                        - Normal stomach. Biopsied.                        - Small hiatal hernia.                        - Esophagogastric landmarks identified.                        - Z-line regular. Recommendation:        - Patient has a contact number available for                         emergencies. The signs and symptoms of potential                         delayed complications were discussed with the patient.                         Return to normal activities tomorrow. Written                         discharge instructions were provided to the patient.                        - Discharge patient to home.                        -  Resume previous diet.                        - Continue present medications.                        - Await pathology results.                        - Return to GI clinic as previously scheduled.                        - proceed with colonoscopy                        - The findings and recommendations were discussed with                         the patient. Procedure Code(s):     --- Professional ---                        [phone], Esophagogastroduodenoscopy, flexible,                         transoral; with biopsy, single or multiple Diagnosis Code(s):     --- Professional ---                        K44.9, Diaphragmatic hernia without obstruction or                         gangrene                        R10.13, Epigastric pain CPT copyright 2022 American [employer] Association. All rights reserved. The codes documented in this  report are preliminary and upon coder review may  be revised to meet current compliance requirements. Attending Participation:      I personally performed the entire procedure. Elfredia NevinsSteven Kiersten Coss, DO Jaynie CollinsSteven Michael Tyona Nilsen DO, DO 07/07/2022 1:16:44 PM This report has been signed electronically. Number of Addenda: 0 Note Initiated On: 07/07/2022 12:59 PM Estimated Blood Loss:  Estimated blood loss was minimal.      [facility]

## 2022-07-08 ENCOUNTER — Encounter: Payer: Self-pay | Admitting: Gastroenterology

## 2022-07-08 LAB — SURGICAL PATHOLOGY

## 2022-07-08 NOTE — Addendum Note (Signed)
Addendum  created 07/08/22 1206 by Stormy Fabian, CRNA   Intraprocedure Meds edited

## 2022-07-13 ENCOUNTER — Ambulatory Visit: Payer: Medicare HMO | Admitting: Surgery

## 2022-07-13 ENCOUNTER — Other Ambulatory Visit: Payer: Self-pay | Admitting: Nurse Practitioner

## 2022-07-13 DIAGNOSIS — M5432 Sciatica, left side: Secondary | ICD-10-CM

## 2022-07-18 ENCOUNTER — Ambulatory Visit: Payer: Medicare HMO | Admitting: Surgery

## 2022-07-18 ENCOUNTER — Encounter: Payer: Self-pay | Admitting: Surgery

## 2022-07-18 VITALS — BP 137/81 | HR 61 | Temp 98.0°F | Ht 63.0 in | Wt 135.0 lb

## 2022-07-18 DIAGNOSIS — K219 Gastro-esophageal reflux disease without esophagitis: Secondary | ICD-10-CM

## 2022-07-18 NOTE — Patient Instructions (Signed)
Continue with the twice daily Pantoprazole. Call us if your symptoms start coming back and you would like to look into surgery.   Follow-up with our office as needed.  Please call and ask to speak with a nurse if you develop questions or concerns.

## 2022-07-19 ENCOUNTER — Encounter: Payer: Self-pay | Admitting: Surgery

## 2022-07-19 NOTE — Progress Notes (Signed)
Surgical Consultation  07/19/2022  Regina Perry is an 62 y.o. female.   Chief Complaint  Patient presents with   Follow-up     HPI: 62 year old female f/u for reflux.  Endorses heartburn w good relief with PPI. Worse when laying supine. Endorses additional colicky non specific abdominal pain, no specific aggravating or allevating factors. She endorses GERD and chronic  cough EGDperformed a few weeks ago by Dr. Timothy Lasso  personally reviewed with H/H. SHe does have significant anxiety disorder and chronic pain w chronic narcotic use. Ct pers. reviewed tiny sliding HH No dysphagia. Takes hydrocodone daily for back pain. Disabled after shoulder injury. Abdominal pain diffuse intermittent and colicky type. 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 She  Does have a preserved ejection fraction and no recent evidence of acute coronary disease Smokes daily. 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   CORONARY ARTER(CABG) X2  LIMA to LAD and SVG TO OM1 01/09/19    Vitamin D deficiency     Past Surgical History:  Procedure  Laterality Date   ABDOMINAL HYSTERECTOMY     partial   BREAST BIOPSY     CESAREAN SECTION     COLONOSCOPY     COLONOSCOPY N/A 07/07/2022   Procedure: COLONOSCOPY;  Surgeon: Jaynie Collins, DO;  Location: [facility] ENDOSCOPY;  Service: Gastroenterology;  Laterality: N/A;   CORONARY ANGIOPLASTY     CORONARY ARTERY BYPASS GRAFT N/A 01/09/2019   Procedure: CORONARY ARTERY BYPASS GRAFTING (CABG) X2 USING LEFT INTERNAL MAMMARY ARTERY TO CIRC AND RIGHT GREATER SAPHENOUS VEIN TO LAD.;  Surgeon: Corliss Skains, MD;  Location: [facility] OR;  Service: Open Heart Surgery;  Laterality: N/A;   CORONARY ARTERY BYPASS GRAFT     ESOPHAGOGASTRODUODENOSCOPY N/A 07/07/2022   Procedure: ESOPHAGOGASTRODUODENOSCOPY (EGD);  Surgeon: Jaynie Collins, DO;  Location: [facility] ENDOSCOPY;  Service: Gastroenterology;  Laterality: N/A;   ETHMOIDECTOMY Bilateral 06/26/2014   Procedure: BILATERAL ANTERIOR ETHMOIDECTOMY;  Surgeon: Drema Halon, MD;  Location: [facility];  Service: ENT;  Laterality: Bilateral;   LEFT HEART CATH AND CORONARY ANGIOGRAPHY N/A 01/08/2019   Procedure: LEFT HEART CATH AND CORONARY ANGIOGRAPHY;  Surgeon: Elder Negus, MD;  Location: [facility] INVASIVE CV LAB;  Service: Cardiovascular;  Laterality: N/A;   MAXILLARY ANTROSTOMY Bilateral 06/26/2014   Procedure: BILATERAL MAXILLARY OSTEA ENLARGEMENT;  Surgeon: Drema Halon, MD;  Location: [facility];  Service: ENT;  Laterality: Bilateral;  NASAL SEPTOPLASTY W/ TURBINOPLASTY Bilateral 06/26/2014   Procedure: BILATERAL NASAL SEPTOPLASTY WITH TURBINATE REDUCTION;  Surgeon: Drema Halon, MD;  Location: [facility];  Service: ENT;  Laterality: Bilateral;   SHOULDER ARTHROSCOPY W/ ROTATOR CUFF REPAIR  08/2013   right   TEE WITHOUT CARDIOVERSION  01/09/2019   Procedure: Transesophageal Echocardiogram (Tee);  Surgeon: Corliss Skains, MD;  Location: [facility] OR;  Service: Open Heart Surgery;;    TONSILLECTOMY AND ADENOIDECTOMY     TUBAL LIGATION      Family History  Problem Relation Age of Onset   Heart disease Mother        MI   Irritable bowel syndrome Mother    Migraines Mother    COPD Mother    Emphysema Mother    Cerebral aneurysm Father    Pancreatic cancer Father    AAA (abdominal aortic aneurysm) Sister    Breast cancer Paternal Grandmother    Lung cancer Paternal Grandfather    Colon cancer Neg Hx    Esophageal cancer Neg Hx    Rectal cancer Neg Hx    Stomach cancer Neg Hx     Social History:  reports that she has been smoking cigarettes. She started smoking about 3 years ago. She has a 35.00 pack-year smoking history. She has been exposed to tobacco smoke. She has never used smokeless tobacco. She reports that she does not drink alcohol and does not use drugs.  Allergies:  Allergies  Allergen Reactions   Levofloxacin Nausea Only   Percocet [Oxycodone-Acetaminophen] Other (See Comments)    Sick on stomach when taken with anesthesia     Medications reviewed.     ROS Full ROS performed and is otherwise negative other than what is stated in the HPI    BP 137/81   Pulse 61   Temp 98 F (36.7 C)   Ht 5\' 3"  (1.6 m)   Wt 135 lb (61.2 kg)   SpO2 97%   BMI 23.91 kg/m   Physical Exam  Physical Exam Vitals and nursing note reviewed. Exam conducted with a chaperone present.  Constitutional:      General: She is not in acute distress.    Appearance: Normal appearance. She is obese. She is not toxic-appearing.  Eyes:     General: No scleral icterus.       Right eye: No discharge.        Left eye: No discharge.  Cardiovascular:     Rate and Rhythm: Normal rate and regular rhythm.     Heart sounds: No murmur heard.    No friction rub.  Pulmonary:     Effort: Pulmonary effort is normal. No respiratory distress.     Breath sounds: No stridor. Wheezing present.  Abdominal:     General: Abdomen is flat. There is no distension.     Palpations:  Abdomen is soft. There is no mass.     Tenderness: There is abdominal tenderness. There is no guarding or rebound.     Hernia: No hernia is present.     Comments: Tender epigastrium and LLQ , no peritonitis or rebound  Musculoskeletal:        General: No swelling or tenderness. Normal range of motion.     Cervical back: Normal range of motion and neck supple. No rigidity.  Skin:    General: Skin is warm and dry.     Capillary Refill: Capillary refill takes less than 2 seconds.     Coloration: Skin  is not jaundiced.  Neurological:     General: No focal deficit present.     Mental Status: She is alert and oriented to person, place, and time.  Psychiatric:        Mood and Affect: Mood normal.        Behavior: Behavior normal.        Thought Content: Thought content normal.        Judgment: Judgment normal.    Assessment/Plan: 62 year old female with significant reflux disease but with significant other comorbidities to include coronary artery disease, chronic back pain, anxiety. Discussed with patient in detail about her disease process.  She is currently happy w her current medical rx. We did talk about antireflux surgery, outcomes postoperative course, the surgery itself the risk the benefits and the possible complications. She wishes to wait for now,.  Please note that I have spent 30 minutes in this encounter including personally reviewing imaging studies, coordinating his care, counseling the patient and performing appropriate documentation    Sterling Big, MD [facility] General Surgeon

## 2022-07-28 ENCOUNTER — Other Ambulatory Visit: Payer: Self-pay | Admitting: Cardiology

## 2022-07-28 ENCOUNTER — Encounter: Payer: Self-pay | Admitting: Cardiology

## 2022-07-28 ENCOUNTER — Ambulatory Visit: Payer: Medicare HMO | Admitting: Cardiology

## 2022-07-28 VITALS — BP 146/83 | HR 67 | Resp 16 | Ht 63.0 in | Wt 131.2 lb

## 2022-07-28 DIAGNOSIS — F17200 Nicotine dependence, unspecified, uncomplicated: Secondary | ICD-10-CM

## 2022-07-28 DIAGNOSIS — I25118 Atherosclerotic heart disease of native coronary artery with other forms of angina pectoris: Secondary | ICD-10-CM

## 2022-07-28 DIAGNOSIS — I6523 Occlusion and stenosis of bilateral carotid arteries: Secondary | ICD-10-CM

## 2022-07-28 DIAGNOSIS — Z951 Presence of aortocoronary bypass graft: Secondary | ICD-10-CM

## 2022-07-28 DIAGNOSIS — E782 Mixed hyperlipidemia: Secondary | ICD-10-CM

## 2022-07-28 DIAGNOSIS — I10 Essential (primary) hypertension: Secondary | ICD-10-CM

## 2022-07-28 DIAGNOSIS — R072 Precordial pain: Secondary | ICD-10-CM

## 2022-07-28 DIAGNOSIS — I714 Abdominal aortic aneurysm, without rupture, unspecified: Secondary | ICD-10-CM

## 2022-07-28 NOTE — Progress Notes (Signed)
Regina Perry Date of Birth: [birth year] MRN: [id number] Primary Care Provider:Sun, Charise Carwin, MD Former Cardiology Providers: Altamese Cleone, APRN, FNP-C Primary Cardiologist: Tessa Lerner, DO, [facility] (established care 04/22/2019) Primary cardiothoracic surgery: Dr. Cliffton Asters  Date: 07/28/22 Last Office Visit: 04/22/2022  Chief Complaint  Patient presents with   Follow-up    For test results   Dizziness   Chest Pain    HPI  Regina Perry is a 62 y.o.  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   Patient being followed by the practice given her history of CAD status post two-vessel bypass in 2020, abdominal aortic aneurysm, and carotid disease.  Patient presents today for sooner than scheduled office visit for evaluation of chest pain and dizziness.  Chest pain: Started 3 weeks ago, last episode this past Monday, located over the anterior chest wall, radiates to right shoulder blade, intensity 6 out of 10, lasting less than 5 minutes, not brought on by effort related activities, does not resolve with resting, self-limited, no use of sublingual nitroglycerin tablet, the symptoms are not like her anginal discomfort back in 2020 prior to her bypass surgery.  Dizziness: Also ongoing for the last 3 weeks, occurs daily, more pronounced with turning her head side-to-side and not with changing positions.  Patient states that she is also currently under a lot of stress which may be contributory to her symptoms.  And she was doing well with regards to complete smoking cessation but in the recent past has been smoking a few cigarettes on a weekly basis.  FUNCTIONAL STATUS: She walks 3-4 miles per day.    ALLERGIES: Allergies  Allergen Reactions   Levofloxacin Nausea Only   Percocet [Oxycodone-Acetaminophen] Other (See Comments)    Sick on stomach when taken with anesthesia    MEDICATION LIST PRIOR TO VISIT: Current Outpatient Medications on File Prior to Visit  Medication Sig Dispense Refill   acetaminophen (TYLENOL) 500 MG tablet Take 1,000 mg by mouth every 6 (six) hours as needed for moderate pain or headache.     cetirizine (ZYRTEC) 10 MG tablet Take 1 tablet (10 mg total) by mouth at bedtime. 90 tablet 1   clopidogrel (PLAVIX) 75 MG tablet TAKE 1 TABLET EVERY DAY 90 tablet 3   gabapentin (NEURONTIN) 300 MG capsule TAKE 1 CAPSULE THREE TIMES DAILY 270 capsule 3   HYDROcodone-acetaminophen (NORCO) 10-325 MG tablet Take 1 tablet by mouth every 6 (six) hours as needed for moderate pain.     LORazepam (ATIVAN) 0.5 MG tablet Take 0.5 mg by mouth every 6 (six) hours as needed for anxiety.     losartan (COZAAR) 25 MG tablet TAKE 1 TABLET EVERY EVENING 90 tablet 3   metoprolol succinate (TOPROL-XL) 25 MG 24 hr tablet TAKE 1 TABLET EVERY DAY 90 tablet 3   nitroGLYCERIN (NITROSTAT) 0.4 MG SL tablet Place 1 tablet (0.4 mg total) under the tongue every 5 (five) minutes as needed for chest pain. 90 tablet 0   pantoprazole (PROTONIX) 40 MG tablet TAKE 1 TABLET EVERY DAY (Patient taking differently: Take 40 mg by mouth 2 (two) times daily. In the AM before breakfast and one before dinner.) 90 tablet 1   promethazine (PHENERGAN) 25 MG tablet Take 1 tablet (25 mg total) by mouth every 6 (six) hours as needed for nausea  or vomiting. 90 tablet 0   rosuvastatin (CRESTOR) 40 MG tablet Take 1 tablet (40 mg total) by mouth daily. 90 tablet 1   SUMAtriptan (IMITREX) 100 MG tablet TAKE 1 TABLET (100 MG TOTAL) BY MOUTH EVERY 2 (TWO) HOURS AS NEEDED FOR MIGRAINE OR HEADACHE. 27 tablet 3   escitalopram (LEXAPRO) 20 MG tablet Take 1 tablet (20 mg total) by mouth at bedtime. (Patient not taking: Reported on  07/28/2022) 90 tablet 1   No current facility-administered medications on file prior to visit.    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   CORONARY ARTER(CABG) X2  LIMA to LAD and SVG TO OM1 01/09/19    Vitamin D deficiency     PAST SURGICAL HISTORY: Past Surgical History:  Procedure Laterality Date   ABDOMINAL HYSTERECTOMY     partial   BREAST BIOPSY     CESAREAN SECTION     COLONOSCOPY     COLONOSCOPY N/A 07/07/2022   Procedure: COLONOSCOPY;  Surgeon: Jaynie Collins, DO;  Location: [facility] ENDOSCOPY;  Service: Gastroenterology;  Laterality: N/A;   CORONARY ANGIOPLASTY     CORONARY ARTERY BYPASS GRAFT N/A 01/09/2019   Procedure: CORONARY ARTERY BYPASS GRAFTING (CABG) X2 USING LEFT INTERNAL MAMMARY ARTERY TO CIRC AND RIGHT GREATER SAPHENOUS VEIN TO LAD.;  Surgeon: Corliss Skains, MD;  Location: [facility] OR;  Service: Open Heart Surgery;  Laterality: N/A;   CORONARY ARTERY BYPASS GRAFT     ESOPHAGOGASTRODUODENOSCOPY N/A 07/07/2022   Procedure: ESOPHAGOGASTRODUODENOSCOPY (EGD);  Surgeon: Jaynie Collins, DO;  Location: [facility] ENDOSCOPY;  Service: Gastroenterology;  Laterality: N/A;   ETHMOIDECTOMY Bilateral 06/26/2014   Procedure: BILATERAL ANTERIOR ETHMOIDECTOMY;  Surgeon: Drema Halon, MD;  Location: [facility];  Service: ENT;   Laterality: Bilateral;   LEFT HEART CATH AND CORONARY ANGIOGRAPHY N/A 01/08/2019   Procedure: LEFT HEART CATH AND CORONARY ANGIOGRAPHY;  Surgeon: Elder Negus, MD;  Location: [facility] INVASIVE CV LAB;  Service: Cardiovascular;  Laterality: N/A;   MAXILLARY ANTROSTOMY Bilateral 06/26/2014   Procedure: BILATERAL MAXILLARY OSTEA ENLARGEMENT;  Surgeon: Drema Halon, MD;  Location: [facility];  Service: ENT;  Laterality: Bilateral;   NASAL SEPTOPLASTY W/ TURBINOPLASTY Bilateral 06/26/2014   Procedure: BILATERAL NASAL SEPTOPLASTY WITH TURBINATE REDUCTION;  Surgeon: Drema Halon, MD;  Location: [facility];  Service: ENT;  Laterality: Bilateral;   SHOULDER ARTHROSCOPY W/ ROTATOR CUFF REPAIR  08/2013   right   TEE WITHOUT CARDIOVERSION  01/09/2019   Procedure: Transesophageal Echocardiogram (Tee);  Surgeon: Corliss Skains, MD;  Location: [facility] OR;  Service: Open Heart Surgery;;   TONSILLECTOMY AND ADENOIDECTOMY     TUBAL LIGATION      FAMILY HISTORY: The patient's  family history includes AAA (abdominal aortic aneurysm) in her sister; Breast cancer in her paternal grandmother; COPD in her mother; Cerebral aneurysm in her father; Emphysema in her mother; Heart disease in her mother; Irritable bowel syndrome in her mother; Lung cancer in her paternal grandfather; Migraines in her mother; Pancreatic cancer in her father.   SOCIAL HISTORY:  The patient  reports that she has been smoking cigarettes. She started smoking about 3 years ago. She has a 35.00 pack-year smoking history. She has been exposed to tobacco smoke. She has never used smokeless tobacco. She reports that she does not drink alcohol and does not use drugs.  Review of Systems  Cardiovascular:  Positive for chest pain. Negative for claudication, dyspnea on exertion, leg swelling, near-syncope, orthopnea, palpitations, paroxysmal nocturnal dyspnea and syncope.  Respiratory:  Negative for  shortness of breath.   Neurological:  Positive for dizziness and light-headedness.    PHYSICAL EXAM:    07/28/2022    1:20 PM 07/18/2022    2:40 PM 07/07/2022    2:05 PM  Vitals with BMI  Height 5\' 3"  5\' 3"    Weight 131 lbs 3 oz 135 lbs   BMI 23.25 23.92   Systolic 146 137 161  Diastolic 83 81 68  Pulse 67 61 51   Orthostatic VS for the past 72 hrs (Last 3 readings):  Orthostatic BP Patient Position BP Location Cuff Size Orthostatic Pulse  07/28/22 1358 135/83 Sitting Left Arm Normal 69  07/28/22 1357 144/75 Sitting Left Arm Normal 65  07/28/22 1354 143/79 Supine Left Arm Normal 60   Physical Exam  Constitutional: No distress.  Age appropriate, hemodynamically stable.   Neck: No JVD present.  Cardiovascular: Normal rate, regular rhythm, S1 normal, S2 normal, intact distal pulses and normal pulses. Exam reveals no gallop, no S3 and no S4.  No murmur heard. Pulmonary/Chest: Effort normal and breath sounds normal. No stridor. She has no wheezes. She has no rales.  Abdominal: Soft. Bowel sounds are normal. She exhibits no distension. There is no abdominal tenderness.  Musculoskeletal:        General: No edema.     Cervical back: Neck supple.  Neurological: She is alert and oriented to person, place, and time. She has intact cranial nerves (2-12).  Skin: Skin is warm and moist.   RADIOLOGY: Coronary CTA 12/25/18:  1. The left main is short and FFRct cannot be modeled in this region. 2. FFRct in the proximal LAD is mildly abnormal. However modeling demonstrates that opening the left main stenosis results in a significant improvement in flow in the proximal LAD. 3. FFRct demonstrates significant stenoses in the proximal and mid LAD. Based on modeling, opening the LM and proximal stenosis does not normalize flow more distally. 4. Recommend cardiac catheterization. Take note of concern for ostial left main disease.  CARDIAC DATABASE: Coronary artery bypass grafting: Year:  01/09/2019 Surgical anatomy: 2 vessel CABG: LIMA to LCx and SVG to LAD  EKG: Jul 28, 2022: Sinus bradycardia, 59 bpm, ST-T changes in the high lateral leads consider lateral ischemia.  When compared to January 2024 ST-T changes are more pronounced.  Echocardiogram: Mar 02, 2022: Normal LV systolic function with visual EF 60-65%. Left ventricle cavity is normal in size. Normal left ventricular wall thickness. Normal global wall motion. Normal diastolic filling pattern, normal LAP.  Mild tricuspid regurgitation. No evidence of pulmonary hypertension. Mild pulmonic regurgitation. Abdominal aorta is dilated (30mm) with atherosclerosis. Compared to 01/09/2019: Mild TR and PR are new.  Stress Testing:  PCV MYOCARDIAL PERFUSION WITH LEXISCAN 11/25/2019 Lexiscan (Walking with mod Bruce)Tetrofosmin Stress Test  11/25/2019: Walking Lexiscan protocol. Equivocal ECG stress. Resting EKG/ECG demonstrated normal sinus rhythm. Peak EKG revealed no ST-T wave abnormalities. Recovery EKG revealed 1 mm horizontal ST depression of the inferolateral leads. Normal myocardial perfusion. Gated SPECT imaging of the left ventricle was normal. All segments of left ventricle demonstrated normal wall motion and thickening. TID is normal. Stress LV EF is normal 63%. Compared to exercise nuclear stress in 07/20/2015, exercise EKG without ischemia and normal perfusion. Low risk. Clinical correlation recommended.  Heart Catheterization: Cath 01/08/2019:  LM: Mid calcified 90% stenosis. LAD: Mid LAD focal calfied 60% stenosis after D2 LCx: Normal RCA: Ostial calcification without significant disease   LVEF normal LVEDP mildly elevated at 21 mmHg  Carotid artery duplex 01/12/2021:  Duplex suggests stenosis in the right internal carotid artery (50-69%).  Duplex suggests stenosis in the left internal carotid artery (16-49%). Antegrade right vertebral artery flow. Antegrade left vertebral artery flow. See report for  additional details.  Carotid artery duplex 03/01/2022: Duplex suggests stenosis in the right internal carotid artery (50-69%).  <50% stenosis in the right external carotid artery. Duplex suggests stenosis in the left internal carotid artery (1-15%). Antegrade right vertebral artery flow. Antegrade left vertebral artery flow. Compared to the study done on 08/31/2021, there is mild progression of the disease on the left. Follow up in six months is appropriate if clinically indicated.   Abdominal Aortic Duplex 08/31/2021: Moderate dilatation of the abdominal aorta is noted in the distal aorta. An abdominal aortic aneurysm measuring 3.3 x 3.1 x 3.1 cm is seen. There is ectasia noted in the proximal and mid abdominal aorta. The aorta is severely calcified throughout including common iliac arteries. Normal flow velocity in the aorta and bilateral CIA.  No significant change from 01/12/2021. Consider reevaluation in 2-3 years for stability.   LABORATORY DATA:    Latest Ref Rng & Units 03/31/2021    9:17 AM 01/06/2021    8:21 PM 11/13/2019    3:47 PM  CBC  WBC 3.4 - 10.8 x10E3/uL 8.4  7.8  7.3   Hemoglobin 11.1 - 15.9 g/dL 16.1  09.6  04.5   Hematocrit 34.0 - 46.6 % 43.3  41.2  43.3   Platelets 150 - 450 x10E3/uL 158  175  195        Latest Ref Rng & Units 03/07/2022   10:59 AM 09/22/2021    1:58 PM 03/31/2021    9:17 AM  CMP  Glucose 70 - 99 mg/dL   99   BUN 6 - 24 mg/dL   13   Creatinine 4.09 - 1.00 mg/dL 8.11  9.14  7.82   Sodium 134 - 144 mmol/L   143   Potassium 3.5 - 5.2 mmol/L   4.5   Chloride 96 - 106 mmol/L   107   CO2 20 - 29 mmol/L   23   Calcium 8.7 - 10.2 mg/dL   9.0   Total Protein 6.0 - 8.5 g/dL   6.5   Total Bilirubin 0.0 - 1.2 mg/dL   0.4   Alkaline Phos 44 - 121 IU/L   66   AST 0 - 40 IU/L   16   ALT 0 - 32 IU/L   13     Lipid Panel  Lab Results  Component Value Date   CHOL 146 03/31/2021   HDL 57 03/31/2021   LDLCALC 70 03/31/2021  LDLDIRECT 61 03/09/2020    TRIG 103 03/31/2021   CHOLHDL 2.6 03/31/2021     Lab Results  Component Value Date   HGBA1C 5.7 (H) 03/31/2021   HGBA1C 6.0 (H) 01/08/2019   HGBA1C 5.6 04/10/2012   No components found for: "NTPROBNP" Lab Results  Component Value Date   TSH 2.730 03/31/2021    Cardiac Panel (last 3 results) No results for input(s): "CKTOTAL", "CKMB", "TROPONINIHS", "RELINDX" in the last 72 hours.  IMPRESSION:    ICD-10-CM   1. Atherosclerosis of native coronary artery of native heart with other form of angina pectoris (HCC)  I25.118 PCV MYOCARDIAL PERFUSION WO LEXISCAN    Lipid Panel With LDL/HDL Ratio    LDL cholesterol, direct    CMP14+EGFR    2. S/P CABG x 2  Z95.1 PCV MYOCARDIAL PERFUSION WO LEXISCAN    Lipid Panel With LDL/HDL Ratio    LDL cholesterol, direct    CMP14+EGFR    3. Precordial pain  R07.2 EKG 12-Lead    PCV MYOCARDIAL PERFUSION WO LEXISCAN    4. Abdominal aortic aneurysm (AAA) without rupture, unspecified part (HCC)  I71.40     5. Asymptomatic bilateral carotid artery stenosis  I65.23     6. Mixed hyperlipidemia  E78.2     7. Essential hypertension  I10     8. Tobacco use disorder  F17.200        RECOMMENDATIONS: Regina Perry is a 62 y.o. female whose past medical history and cardiovascular risk factors include: Hypertension, hyperlipidemia, tobacco use disorder, asymptomatic nonsustained ventricular tachycardia noted on event monitor in 2017, atherosclerosis of the native coronary arteries status post two-vessel CABG 12/2018.   Atherosclerosis of native coronary artery of native heart without angina pectoris  S/P CABG x 2 Precordial pain likely noncardiac. But has multiple cardiovascular comorbidities which include but not limited to history of CAD status post CABG, smoking, etc. EKG shows sinus rhythm with more pronounced ST-T changes when compared to prior tracings Exercise nuclear stress test to evaluate for reversible ischemia. Will recheck fasting  lipid profile to reevaluate the need for up titration of medical therapy Educated on seeking medical attention sooner by going to the closest ER via EMS if the symptoms increase in intensity, frequency, duration, or has typical chest pain as discussed in the office.  Patient verbalized understanding.  Dizziness: I suspect that she has a component of BPPV based on her symptoms. I have asked her to follow-up with PCP and/or ENT for further guidance. Orthostatic vital signs negative  Abdominal aortic aneurysm (AAA) without rupture, unspecified part (HCC) Continue antiplatelet therapy and statin. Most recent abdominal aortic duplex notes some dilatation compared to prior study. Recommend repeat follow-up study in 2 years. Due to the EMR limitations cannot order studies 2 years in advance. Patient is advised that she will need to have an abdominal aortic duplex in June 2025.  Asymptomatic bilateral carotid artery stenosis Asymptomatic. Disease in the right ICA >left ICA. Continue antiplatelets, statin therapy, reemphasized importance of smoking cessation. As recommended we will repeat the study in 6 months to evaluate disease progression.  Mixed hyperlipidemia Currently on rosuvastatin.   She denies myalgia or other side effects. I have requested labs from her PCPs office but unable to acquire them.  Given the degree of CAD and carotid disease would like to recheck fasting lipids to see if further medication titration is warranted. Recommended goal LDL <55 mg/dL given her underlying CAD status post CABG, abdominal aortic aneurysm, and  carotid atherosclerosis.  Essential hypertension Medications reconciled. No changes warranted at this time  Tobacco use disorder Tobacco cessation counseling: Currently smoking <1 packs/week   Patient is willing to quit at this time -has had difficulty in the past. She is refusing to be started on pharmacological therapy.  I will reassess her progress at  the next follow-up visit   FINAL MEDICATION LIST END OF ENCOUNTER: No orders of the defined types were placed in this encounter.   Current Outpatient Medications:    acetaminophen (TYLENOL) 500 MG tablet, Take 1,000 mg by mouth every 6 (six) hours as needed for moderate pain or headache., Disp: , Rfl:    cetirizine (ZYRTEC) 10 MG tablet, Take 1 tablet (10 mg total) by mouth at bedtime., Disp: 90 tablet, Rfl: 1   clopidogrel (PLAVIX) 75 MG tablet, TAKE 1 TABLET EVERY DAY, Disp: 90 tablet, Rfl: 3   gabapentin (NEURONTIN) 300 MG capsule, TAKE 1 CAPSULE THREE TIMES DAILY, Disp: 270 capsule, Rfl: 3   HYDROcodone-acetaminophen (NORCO) 10-325 MG tablet, Take 1 tablet by mouth every 6 (six) hours as needed for moderate pain., Disp: , Rfl:    LORazepam (ATIVAN) 0.5 MG tablet, Take 0.5 mg by mouth every 6 (six) hours as needed for anxiety., Disp: , Rfl:    losartan (COZAAR) 25 MG tablet, TAKE 1 TABLET EVERY EVENING, Disp: 90 tablet, Rfl: 3   metoprolol succinate (TOPROL-XL) 25 MG 24 hr tablet, TAKE 1 TABLET EVERY DAY, Disp: 90 tablet, Rfl: 3   nitroGLYCERIN (NITROSTAT) 0.4 MG SL tablet, Place 1 tablet (0.4 mg total) under the tongue every 5 (five) minutes as needed for chest pain., Disp: 90 tablet, Rfl: 0   pantoprazole (PROTONIX) 40 MG tablet, TAKE 1 TABLET EVERY DAY (Patient taking differently: Take 40 mg by mouth 2 (two) times daily. In the AM before breakfast and one before dinner.), Disp: 90 tablet, Rfl: 1   promethazine (PHENERGAN) 25 MG tablet, Take 1 tablet (25 mg total) by mouth every 6 (six) hours as needed for nausea or vomiting., Disp: 90 tablet, Rfl: 0   rosuvastatin (CRESTOR) 40 MG tablet, Take 1 tablet (40 mg total) by mouth daily., Disp: 90 tablet, Rfl: 1   SUMAtriptan (IMITREX) 100 MG tablet, TAKE 1 TABLET (100 MG TOTAL) BY MOUTH EVERY 2 (TWO) HOURS AS NEEDED FOR MIGRAINE OR HEADACHE., Disp: 27 tablet, Rfl: 3   escitalopram (LEXAPRO) 20 MG tablet, Take 1 tablet (20 mg total) by mouth at  bedtime. (Patient not taking: Reported on 07/28/2022), Disp: 90 tablet, Rfl: 1  Orders Placed This Encounter  Procedures   Lipid Panel With LDL/HDL Ratio   LDL cholesterol, direct   [id number]   PCV MYOCARDIAL PERFUSION WO LEXISCAN   EKG 12-Lead   --Continue cardiac medications as reconciled in final medication list. --Return in about 6 weeks (around 09/08/2022) for Follow up, CAD, Review test results. Or sooner if needed. --Continue follow-up with your primary care physician regarding the management of your other chronic comorbid conditions.  Patient's questions and concerns were addressed to her satisfaction. She voices understanding of the instructions provided during this encounter.   This note was created using a voice recognition software as a result there may be grammatical errors inadvertently enclosed that do not reflect the nature of this encounter. Every attempt is made to correct such errors.  Tessa Lerner, [place], [facility]  Pager:  [phone] Office: [phone]

## 2022-07-29 ENCOUNTER — Encounter: Payer: Self-pay | Admitting: Cardiology

## 2022-07-29 LAB — CMP14+EGFR
ALT: 12 IU/L (ref 0–32)
AST: 19 IU/L (ref 0–40)
Albumin/Globulin Ratio: 2.1 (ref 1.2–2.2)
Albumin: 4.4 g/dL (ref 3.8–4.9)
Alkaline Phosphatase: 73 IU/L (ref 44–121)
BUN/Creatinine Ratio: 12 (ref 9–23)
BUN: 11 mg/dL (ref 6–24)
Bilirubin Total: 0.5 mg/dL (ref 0.0–1.2)
CO2: 22 mmol/L (ref 20–29)
Calcium: 9.5 mg/dL (ref 8.7–10.2)
Chloride: 104 mmol/L (ref 96–106)
Creatinine, Ser: 0.95 mg/dL (ref 0.57–1.00)
Globulin, Total: 2.1 g/dL (ref 1.5–4.5)
Glucose: 96 mg/dL (ref 70–99)
Potassium: 5.1 mmol/L (ref 3.5–5.2)
Sodium: 142 mmol/L (ref 134–144)
Total Protein: 6.5 g/dL (ref 6.0–8.5)
eGFR: 69 mL/min/{1.73_m2} (ref >59)

## 2022-07-29 LAB — LIPID PANEL WITH LDL/HDL RATIO
Cholesterol, Total: 148 mg/dL (ref 100–199)
HDL: 60 mg/dL (ref >39)
LDL Chol Calc (NIH): 69 mg/dL (ref 0–99)
LDL/HDL Ratio: 1.2 ratio (ref 0.0–3.2)
Triglycerides: 106 mg/dL (ref 0–149)
VLDL Cholesterol Cal: 19 mg/dL (ref 5–40)

## 2022-07-29 LAB — LDL CHOLESTEROL, DIRECT: LDL Direct: 67 mg/dL (ref 0–99)

## 2022-08-02 ENCOUNTER — Other Ambulatory Visit: Payer: Self-pay

## 2022-08-02 DIAGNOSIS — I25118 Atherosclerotic heart disease of native coronary artery with other forms of angina pectoris: Secondary | ICD-10-CM

## 2022-08-02 DIAGNOSIS — E782 Mixed hyperlipidemia: Secondary | ICD-10-CM

## 2022-08-02 DIAGNOSIS — Z951 Presence of aortocoronary bypass graft: Secondary | ICD-10-CM

## 2022-08-02 MED ORDER — EZETIMIBE 10 MG PO TABS: 10.0000 mg | ORAL_TABLET | Freq: Every day | ORAL | 3 refills | Status: DC

## 2022-08-02 NOTE — Progress Notes (Signed)
Called patient to give her lab results. Patient understood abut her new Rx and going to [facility] 6 weeks. Zetia 10 mg has been send

## 2022-08-03 ENCOUNTER — Encounter: Payer: Self-pay | Admitting: Gastroenterology

## 2022-08-04 ENCOUNTER — Ambulatory Visit: Payer: Medicare HMO

## 2022-08-04 ENCOUNTER — Other Ambulatory Visit: Payer: Self-pay | Admitting: Internal Medicine

## 2022-08-04 DIAGNOSIS — R072 Precordial pain: Secondary | ICD-10-CM

## 2022-08-04 DIAGNOSIS — I25118 Atherosclerotic heart disease of native coronary artery with other forms of angina pectoris: Secondary | ICD-10-CM

## 2022-08-04 DIAGNOSIS — Z951 Presence of aortocoronary bypass graft: Secondary | ICD-10-CM

## 2022-08-04 MED ORDER — NITROGLYCERIN 0.4 MG SL SUBL: 0.4000 mg | SUBLINGUAL_TABLET | SUBLINGUAL | 6 refills | Status: DC | PRN

## 2022-08-07 LAB — PCV MYOCARDIAL PERFUSION WO LEXISCAN
Angina Index: 0
Base ST Depression (mm): 0 mm

## 2022-08-08 ENCOUNTER — Ambulatory Visit (INDEPENDENT_AMBULATORY_CARE_PROVIDER_SITE_OTHER): Payer: Medicare HMO | Admitting: Family Medicine

## 2022-08-08 ENCOUNTER — Encounter: Payer: Self-pay | Admitting: Family Medicine

## 2022-08-08 VITALS — BP 133/86 | HR 63 | Resp 18 | Ht 63.0 in | Wt 135.0 lb

## 2022-08-08 DIAGNOSIS — M67432 Ganglion, left wrist: Secondary | ICD-10-CM

## 2022-08-08 DIAGNOSIS — R300 Dysuria: Secondary | ICD-10-CM | POA: Diagnosis not present

## 2022-08-08 DIAGNOSIS — I10 Essential (primary) hypertension: Secondary | ICD-10-CM | POA: Diagnosis not present

## 2022-08-08 DIAGNOSIS — Z23 Encounter for immunization: Secondary | ICD-10-CM

## 2022-08-08 DIAGNOSIS — J3089 Other allergic rhinitis: Secondary | ICD-10-CM | POA: Diagnosis not present

## 2022-08-08 DIAGNOSIS — F331 Major depressive disorder, recurrent, moderate: Secondary | ICD-10-CM | POA: Diagnosis not present

## 2022-08-08 DIAGNOSIS — R82998 Other abnormal findings in urine: Secondary | ICD-10-CM

## 2022-08-08 DIAGNOSIS — M5137 Other intervertebral disc degeneration, lumbosacral region: Secondary | ICD-10-CM

## 2022-08-08 DIAGNOSIS — F411 Generalized anxiety disorder: Secondary | ICD-10-CM

## 2022-08-08 DIAGNOSIS — B351 Tinea unguium: Secondary | ICD-10-CM

## 2022-08-08 LAB — POCT URINALYSIS DIPSTICK
Bilirubin, UA: NEGATIVE
Glucose, UA: NEGATIVE
Ketones, UA: NEGATIVE
Nitrite, UA: POSITIVE
Protein, UA: NEGATIVE
Spec Grav, UA: 1.01 (ref 1.010–1.025)
Urobilinogen, UA: 0.2 E.U./dL
pH, UA: 6 (ref 5.0–8.0)

## 2022-08-08 MED ORDER — SULFAMETHOXAZOLE-TRIMETHOPRIM 800-160 MG PO TABS
1.0000 | ORAL_TABLET | Freq: Two times a day (BID) | ORAL | 0 refills | Status: AC
Start: 2022-08-08 — End: 2022-08-13

## 2022-08-08 MED ORDER — LORATADINE 10 MG PO TABS
10.0000 mg | ORAL_TABLET | Freq: Every day | ORAL | 3 refills | Status: DC
Start: 2022-08-08 — End: 2023-11-10

## 2022-08-08 MED ORDER — TERBINAFINE HCL 250 MG PO TABS
250.0000 mg | ORAL_TABLET | Freq: Every day | ORAL | 0 refills | Status: DC
Start: 2022-08-08 — End: 2022-11-17

## 2022-08-08 MED ORDER — ESCITALOPRAM OXALATE 20 MG PO TABS
20.0000 mg | ORAL_TABLET | Freq: Every day | ORAL | 1 refills | Status: DC
Start: 2022-08-08 — End: 2022-11-15

## 2022-08-08 NOTE — Patient Instructions (Signed)
Take 1/2 tablet Lexapro (10 mg) daily for one week, then increase to 20 mg daily.

## 2022-08-08 NOTE — Assessment & Plan Note (Signed)
Recommend switching from Zyrtec to Claritin 10 mg daily for ear fullness and sinus pressure.  If symptoms not relieved, may consider possibility of rhinosinusitis.

## 2022-08-08 NOTE — Assessment & Plan Note (Signed)
GAD-7 score of 17.  Restarting Lexapro.  Start 10 mg Lexapro once daily for 1 week, then increase to Lexapro 20 mg once daily.  Follow-up in about 6 weeks.

## 2022-08-08 NOTE — Assessment & Plan Note (Signed)
We discussed that the cyst is most likely a ganglion cyst which is benign, but there may be management options since it is bothersome.  Patient is agreeable to a referral to orthopedics for further evaluation and management.  We discussed that even with management such as aspiration, there is a good chance of recurrence within a year.  Patient would like to proceed with an appointment with orthopedics to hear about all of her options.

## 2022-08-08 NOTE — Assessment & Plan Note (Signed)
Sending referral to pain management clinic to take over prescription management of DDD and left-sided sciatica pain.

## 2022-08-08 NOTE — Progress Notes (Signed)
New Patient Office Visit  Subjective    Patient ID: Regina Perry, female    DOB: [birth year]  Age: 62 y.o. MRN: [id number]  CC:  Chief Complaint  Patient presents with   Establish Care    HPI [name] presents to establish care. Past medical history includes CAD s/p CABG, hypertension taking losartan and metoprolol, allergic rhinitis, left-sided sciatica and DDD, GAD, HLD, insomnia, restless leg syndrome.  Currently smoking daily with tobacco use disorder. Patient has had dysuria and abnormal smelling urine for 1 week now.  She believes that it is likely a UTI, she is not seeing anyone for it thus far. She has been experiencing bilateral ear fullness left > right and bilateral ethmoid sinus pressure.  She has seasonal allergies and has been taking Zyrtec 10 mg daily for about 7 years for management.  She denies cough, congestion, fever, chills. Her left great toenail has had an infection for about 2 years now.  She has been using an OTC topical antifungal 3 times a day for about 6 months now with no improvement.  The infection has been getting worse. She has a cyst on the flexor surface of her left wrist that increases in size if she uses her left wrist a lot.  Generally it is not painful, but if she does bump it or use the wrist a lot it can become tender.  This has not been evaluated outside of her primary care. She started seeing a different primary care office to prescribe her pain management medication for DDD and left-sided sciatica.  She would like a referral to a different pain management clinic today.  Outpatient Encounter Medications as of 08/08/2022  Medication Sig   acetaminophen (TYLENOL) 500 MG tablet Take 1,000 mg by mouth every 6 (six) hours as needed for moderate pain or headache.   clopidogrel (PLAVIX) 75 MG tablet TAKE 1 TABLET EVERY DAY   estradiol (ESTRACE) 0.1 MG/GM vaginal cream Place 1 Applicatorful vaginally at bedtime.   ezetimibe (ZETIA) 10 MG tablet Take 1  tablet (10 mg total) by mouth daily.   gabapentin (NEURONTIN) 300 MG capsule TAKE 1 CAPSULE THREE TIMES DAILY   HYDROcodone-acetaminophen (NORCO) 10-325 MG tablet Take 1 tablet by mouth every 6 (six) hours as needed for moderate pain.   loratadine (CLARITIN) 10 MG tablet Take 1 tablet (10 mg total) by mouth daily.   LORazepam (ATIVAN) 0.5 MG tablet Take 0.5 mg by mouth every 6 (six) hours as needed for anxiety.   losartan (COZAAR) 25 MG tablet TAKE 1 TABLET EVERY EVENING   metoprolol succinate (TOPROL-XL) 25 MG 24 hr tablet TAKE 1 TABLET EVERY DAY   nitroGLYCERIN (NITROSTAT) 0.4 MG SL tablet Place 1 tablet (0.4 mg total) under the tongue every 5 (five) minutes as needed for chest pain.   pantoprazole (PROTONIX) 40 MG tablet TAKE 1 TABLET EVERY DAY (Patient taking differently: Take 40 mg by mouth 2 (two) times daily. In the AM before breakfast and one before dinner.)   promethazine (PHENERGAN) 25 MG tablet Take 1 tablet (25 mg total) by mouth every 6 (six) hours as needed for nausea or vomiting.   sulfamethoxazole-trimethoprim (BACTRIM DS) 800-160 MG tablet Take 1 tablet by mouth 2 (two) times daily for 5 days.   SUMAtriptan (IMITREX) 100 MG tablet TAKE 1 TABLET (100 MG TOTAL) BY MOUTH EVERY 2 (TWO) HOURS AS NEEDED FOR MIGRAINE OR HEADACHE.   terbinafine (LAMISIL) 250 MG tablet Take 1 tablet (250 mg total) by  mouth daily.   [DISCONTINUED] cetirizine (ZYRTEC) 10 MG tablet Take 1 tablet (10 mg total) by mouth at bedtime.   escitalopram (LEXAPRO) 20 MG tablet Take 1 tablet (20 mg total) by mouth at bedtime.   rosuvastatin (CRESTOR) 40 MG tablet Take 1 tablet (40 mg total) by mouth daily.   [DISCONTINUED] escitalopram (LEXAPRO) 20 MG tablet Take 1 tablet (20 mg total) by mouth at bedtime. (Patient not taking: Reported on 07/28/2022)   No facility-administered encounter medications on file as of 08/08/2022.    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   CORONARY ARTER(CABG) X2  LIMA to LAD and SVG TO OM1 01/09/19    Vitamin D deficiency     Past Surgical History:  Procedure Laterality Date   ABDOMINAL HYSTERECTOMY     partial   BREAST BIOPSY     CESAREAN SECTION     COLONOSCOPY     COLONOSCOPY N/A 07/07/2022   Procedure: COLONOSCOPY;  Surgeon: Jaynie Collins, DO;  Location: [facility] ENDOSCOPY;  Service: Gastroenterology;  Laterality: N/A;   CORONARY ANGIOPLASTY     CORONARY ARTERY BYPASS GRAFT N/A 01/09/2019   Procedure: CORONARY ARTERY BYPASS GRAFTING (CABG) X2 USING LEFT INTERNAL MAMMARY ARTERY TO CIRC AND RIGHT GREATER SAPHENOUS VEIN TO LAD.;  Surgeon: Corliss Skains, MD;  Location: [facility] OR;  Service: Open Heart Surgery;  Laterality: N/A;   CORONARY ARTERY BYPASS GRAFT     ESOPHAGOGASTRODUODENOSCOPY N/A 07/07/2022   Procedure: ESOPHAGOGASTRODUODENOSCOPY (EGD);  Surgeon: Jaynie Collins, DO;  Location: [facility] ENDOSCOPY;  Service: Gastroenterology;  Laterality: N/A;   ETHMOIDECTOMY Bilateral 06/26/2014   Procedure: BILATERAL ANTERIOR ETHMOIDECTOMY;  Surgeon: Drema Halon, MD;  Location: [facility];  Service: ENT;  Laterality: Bilateral;   LEFT HEART CATH AND CORONARY ANGIOGRAPHY N/A 01/08/2019   Procedure: LEFT HEART CATH AND CORONARY ANGIOGRAPHY;  Surgeon: Elder Negus, MD;  Location: [facility];  Service: Cardiovascular;  Laterality:  N/A;   MAXILLARY ANTROSTOMY Bilateral 06/26/2014   Procedure: BILATERAL MAXILLARY OSTEA ENLARGEMENT;  Surgeon: Drema Halon, MD;  Location:  SURGERY CENTER;  Service: ENT;  Laterality: Bilateral;   NASAL SEPTOPLASTY W/ TURBINOPLASTY Bilateral 06/26/2014   Procedure: BILATERAL NASAL SEPTOPLASTY WITH TURBINATE REDUCTION;  Surgeon: Drema Halon, MD;  Location: [facility];  Service: ENT;  Laterality: Bilateral;   SHOULDER ARTHROSCOPY W/ ROTATOR CUFF REPAIR  08/2013   right   TEE WITHOUT CARDIOVERSION  01/09/2019   Procedure: Transesophageal Echocardiogram (Tee);  Surgeon: Corliss Skains, MD;  Location: [facility] OR;  Service: Open Heart Surgery;;   TONSILLECTOMY AND ADENOIDECTOMY     TUBAL LIGATION      Family History  Problem Relation Age of Onset   Heart  disease Mother        MI   Irritable bowel syndrome Mother    Migraines Mother    COPD Mother    Emphysema Mother    Cerebral aneurysm Father    Pancreatic cancer Father    AAA (abdominal aortic aneurysm) Sister    Breast cancer Paternal Grandmother    Lung cancer Paternal Grandfather    Colon cancer Neg Hx    Esophageal cancer Neg Hx    Rectal cancer Neg Hx    Stomach cancer Neg Hx     Social History   Socioeconomic History   Marital status: Married    Spouse name: Fayrene Fearing   Number of children: 2   Years of education: 12   Highest education level: Not on file  Occupational History   Occupation: unemployed  Tobacco Use   Smoking status: Every Day    Packs/day: 1.00    Years: 35.00    Additional pack years: 0.00    Total pack years: 35.00    Types: Cigarettes    Start date: 12/30/2018    Passive exposure: Past   Smokeless tobacco: Never   Tobacco comments:    Patient wants to quit smoking, smokes 15 cigarettes a day.  Vaping Use   Vaping Use: Never used  Substance and Sexual Activity   Alcohol use: No   Drug use: No   Sexual activity: Not Currently  Other Topics Concern    Not on file  Social History Narrative   Patient drinks about 3-4 cups of caffeine daily.    Patient is right handed.    Social Determinants of Health   Financial Resource Strain: Not on file  Food Insecurity: Not on file  Transportation Needs: Not on file  Physical Activity: Not on file  Stress: Not on file  Social Connections: Not on file  Intimate Partner Violence: Not on file    Review of Systems  Constitutional:  Negative for chills, fever and malaise/fatigue.  HENT:  Negative for congestion, ear pain and hearing loss.        Ear fullness, sinus pressure  Eyes:  Negative for blurred vision and double vision.  Respiratory:  Negative for cough and shortness of breath.   Cardiovascular:  Negative for chest pain, palpitations and leg swelling.  Gastrointestinal:  Negative for abdominal pain, constipation, diarrhea and heartburn.  Genitourinary:  Positive for dysuria and urgency. Negative for flank pain, frequency and hematuria.  Musculoskeletal:  Negative for myalgias and neck pain.  Neurological:  Negative for headaches.  Endo/Heme/Allergies:  Negative for polydipsia.  Psychiatric/Behavioral:  Negative for depression. The patient is not nervous/anxious.     Objective    BP 133/86 (BP Location: Right Arm, Patient Position: Sitting, Cuff Size: Normal)   Pulse 63   Resp 18   Ht 5\' 3"  (1.6 m)   Wt 135 lb (61.2 kg)   SpO2 97%   BMI 23.91 kg/m   Physical Exam Constitutional:      General: She is not in acute distress.    Appearance: Normal appearance.  HENT:     Head: Normocephalic and atraumatic.  Cardiovascular:     Rate and Rhythm: Normal rate and regular rhythm.     Pulses: Normal pulses.     Heart sounds: No murmur heard.    No friction rub. No gallop.  Pulmonary:     Effort: Pulmonary effort is normal. No respiratory distress.     Breath sounds: No wheezing, rhonchi or rales.  Musculoskeletal:     Comments: Ganglion cyst over left radius, ~1.5 cm nontender to  palpation  Skin:    General: Skin is warm and dry.  Neurological:     Mental Status: She is alert and oriented to person, place, and time.       08/08/2022    8:36 AM 11/16/2021    2:03 PM 08/26/2021    3:08 PM  Depression screen PHQ 2/9  Decreased Interest 3 1 0  Down, Depressed, Hopeless 3 2 0  PHQ - 2 Score 6 3 0  Altered sleeping 1 1 0  Tired, decreased energy 3 1 0  Change in appetite 1 1 0  Feeling bad or failure about yourself  2 1 0  Trouble concentrating 1 1 1   Moving slowly or fidgety/restless 1 0 0  Suicidal thoughts 0 0 0  PHQ-9 Score 15 8 1   Difficult doing work/chores Very difficult        08/08/2022    8:37 AM 11/16/2021    2:03 PM 08/26/2021    3:09 PM 05/26/2021   11:11 AM  GAD 7 : Generalized Anxiety Score  Nervous, Anxious, on Edge 3 2 1 2   Control/stop worrying 2 2 1 2   Worry too much - different things 3 2 1 1   Trouble relaxing 2 1 0 1  Restless 1 1 0 1  Easily annoyed or irritable 3 1 0 1  Afraid - awful might happen 3 2 1 2   Total GAD 7 Score 17 11 4 10   Anxiety Difficulty Very difficult       Assessment & Plan:  Generalized anxiety disorder Assessment & Plan: GAD-7 score of 17.  Restarting Lexapro.  Start 10 mg Lexapro once daily for 1 week, then increase to Lexapro 20 mg once daily.  Follow-up in about 6 weeks.  Orders: -     Escitalopram Oxalate; Take 1 tablet (20 mg total) by mouth at bedtime.  Dispense: 90 tablet; Refill: 1  Moderate episode of recurrent major depressive disorder (HCC) Assessment & Plan: PHQ-9 score 15.  Restarting Lexapro, start Lexapro 10 mg daily for 1 week then increase to Lexapro 20 mg daily.  Follow-up in 6 weeks.  If not effective, may consider adding Wellbutrin 150 mg twice daily.   Perennial allergic rhinitis Assessment & Plan: Recommend switching from Zyrtec to Claritin 10 mg daily for ear fullness and sinus pressure.  If symptoms not relieved, may consider possibility of rhinosinusitis.  Orders: -      Loratadine; Take 1 tablet (10 mg total) by mouth daily.  Dispense: 90 tablet; Refill: 3  Primary hypertension Assessment & Plan: Blood pressure goal <130/80 given history CAD.  Blood pressure nearly at goal on recheck at 133/86.  After restarting Lexapro and getting better control of mood symptoms, will recheck blood pressure at follow-up appointment and ensure that it is at goal.  If not, may consider increasing losartan to 50 mg daily.   DDD (degenerative disc disease), lumbosacral Assessment & Plan: Sending referral to pain management clinic to take over prescription management of DDD and left-sided sciatica pain.  Orders: -     Ambulatory referral to Pain Clinic  Ganglion cyst of volar aspect of left wrist Assessment & Plan: We discussed that the cyst is most likely a ganglion cyst which is benign, but there may be management options since it is bothersome.  Patient is agreeable to a referral to orthopedics for further evaluation and management.  We discussed that  even with management such as aspiration, there is a good chance of recurrence within a year.  Patient would like to proceed with an appointment with orthopedics to hear about all of her options.  Orders: -     Ambulatory referral to Orthopedic Surgery  Onychomycosis of left great toe -     Terbinafine HCl; Take 1 tablet (250 mg total) by mouth daily.  Dispense: 72 tablet; Refill: 0  Need for Tdap vaccination -     Tdap vaccine greater than or equal to 7yo IM  Dysuria -     POCT urinalysis dipstick -     Urine Culture -     Sulfamethoxazole-Trimethoprim; Take 1 tablet by mouth 2 (two) times daily for 5 days.  Dispense: 10 tablet; Refill: 0  Leukocytes in urine -     Urine Culture -     Sulfamethoxazole-Trimethoprim; Take 1 tablet by mouth 2 (two) times daily for 5 days.  Dispense: 10 tablet; Refill: 0  Onychomycosis Failed topical therapy, discussed 12-week course of p.o. terbinafine 250 mg daily.  If not fully  resolved after 12-week course, recommend referral to podiatry.  Patient verbalized agreement.  UTI Starting 5-day course of Bactrim, nitrofurantoin contraindicated given creatinine clearance.  Encouraged her to continue with adequate hydration.  Sending for urine culture to ensure efficacy of antibiotics.  We discussed that since her first dose of Shingrix was completed in January-February, she can get her second dose at her local pharmacy at any time.  Patient agreeable to Tdap in office today.  At her annual physical, we will ensure that she is up-to-date on all other preventative measures.  Pap smears have been discontinued as she had a partial hysterectomy with removal of her cervix in 2008.  She will be due for her pneumonia vaccine and a low-dose lung CT to screen for lung cancer.  Will also discuss smoking cessation at that point.  Return in about 6 weeks (around 09/19/2022) for follow-up for mood, restarting Lexapro.  She will need an appointment for her annual physical about a month after her follow-up appointment.  Melida Quitter, PA

## 2022-08-08 NOTE — Assessment & Plan Note (Signed)
Blood pressure goal <130/80 given history CAD.  Blood pressure nearly at goal on recheck at 133/86.  After restarting Lexapro and getting better control of mood symptoms, will recheck blood pressure at follow-up appointment and ensure that it is at goal.  If not, may consider increasing losartan to 50 mg daily.

## 2022-08-08 NOTE — Assessment & Plan Note (Signed)
PHQ-9 score 15.  Restarting Lexapro, start Lexapro 10 mg daily for 1 week then increase to Lexapro 20 mg daily.  Follow-up in 6 weeks.  If not effective, may consider adding Wellbutrin 150 mg twice daily.

## 2022-08-10 LAB — URINE CULTURE

## 2022-08-11 ENCOUNTER — Ambulatory Visit (INDEPENDENT_AMBULATORY_CARE_PROVIDER_SITE_OTHER): Payer: Medicare HMO | Admitting: Orthopaedic Surgery

## 2022-08-11 ENCOUNTER — Encounter: Payer: Self-pay | Admitting: Orthopaedic Surgery

## 2022-08-11 DIAGNOSIS — M67432 Ganglion, left wrist: Secondary | ICD-10-CM

## 2022-08-11 DIAGNOSIS — M65312 Trigger thumb, left thumb: Secondary | ICD-10-CM | POA: Diagnosis not present

## 2022-08-11 MED ORDER — BUPIVACAINE HCL 0.5 % IJ SOLN
0.3300 mL | INTRAMUSCULAR | Status: AC | PRN
Start: 2022-08-11 — End: 2022-08-11
  Administered 2022-08-11: .33 mL

## 2022-08-11 MED ORDER — LIDOCAINE HCL 1 % IJ SOLN
0.3000 mL | INTRAMUSCULAR | Status: AC | PRN
Start: 2022-08-11 — End: 2022-08-11
  Administered 2022-08-11: .3 mL

## 2022-08-11 MED ORDER — METHYLPREDNISOLONE ACETATE 40 MG/ML IJ SUSP
13.3300 mg | INTRAMUSCULAR | Status: AC | PRN
Start: 2022-08-11 — End: 2022-08-11
  Administered 2022-08-11: 13.33 mg

## 2022-08-11 NOTE — Progress Notes (Signed)
Called patient no answer left a vm

## 2022-08-11 NOTE — Progress Notes (Signed)
Office Visit Note   Patient: Regina Perry           Date of Birth: 1960-12-14           MRN: [id number] Visit Date: 08/11/2022              Requested by: Melida Quitter, PA [address] [place],  [place] [address] PCP: Melida Quitter, PA   Assessment & Plan: Visit Diagnoses:  1. Trigger thumb, left thumb   2. Ganglion cyst of volar aspect of left wrist     Plan: Impression is 62 year old female with stable left volar wrist ganglion cyst and left trigger thumb.  I explained that the trigger thumb is a separate condition.  Based on her options she elected to try a steroid injection for the trigger thumb.  She is not interested in doing anything for the cyst at this time.  Follow-up as needed.  Follow-Up Instructions: No follow-ups on file.   Orders:  No orders of the defined types were placed in this encounter.  No orders of the defined types were placed in this encounter.     Procedures: Hand/UE Inj: L thumb A1 for trigger finger on 08/11/2022 7:41 PM Indications: pain Details: 25 G needle Medications: 0.3 mL lidocaine 1 %; 0.33 mL bupivacaine 0.5 %; 13.33 mg methylPREDNISolone acetate 40 MG/ML Outcome: tolerated well, no immediate complications Consent was given by the patient. Patient was prepped and draped in the usual sterile fashion.       Clinical Data: No additional findings.   Subjective: Chief Complaint  Patient presents with   Left Wrist - Pain    HPI [name] is a 62 year old female who comes in for evaluation of a left wrist ganglion cyst and a locking left thumb.  She states she noticed a cyst after she had to have a heart catheterization for an MI in which the used her left radial nerve for access.  She has had the cyst for about 4 years.  She feels that she is now experiencing locking of the thumb.  Denies any injuries. Review of Systems  Constitutional: Negative.   HENT: Negative.    Eyes: Negative.   Respiratory: Negative.     Cardiovascular: Negative.   Endocrine: Negative.   Musculoskeletal: Negative.   Neurological: Negative.   Hematological: Negative.   Psychiatric/Behavioral: Negative.    All other systems reviewed and are negative.    Objective: Vital Signs: There were no vitals taken for this visit.  Physical Exam Vitals and nursing note reviewed.  Constitutional:      Appearance: She is well-developed.  HENT:     Head: Atraumatic.     Nose: Nose normal.  Eyes:     Extraocular Movements: Extraocular movements intact.  Cardiovascular:     Pulses: Normal pulses.  Pulmonary:     Effort: Pulmonary effort is normal.  Abdominal:     Palpations: Abdomen is soft.  Musculoskeletal:     Cervical back: Neck supple.  Skin:    General: Skin is warm.     Capillary Refill: Capillary refill takes less than 2 seconds.  Neurological:     Mental Status: She is alert. Mental status is at baseline.  Psychiatric:        Behavior: Behavior normal.        Thought Content: Thought content normal.        Judgment: Judgment normal.     Ortho Exam Examination left thumb shows tenderness  at the A1 pulley with triggering.  She has a small volar wrist ganglion cyst.  Does not have a palpable bruit. Specialty Comments:  No specialty comments available.  Imaging: No results found.   PMFS History: Patient Active Problem List   Diagnosis Date Noted   Trigger thumb, left thumb 08/11/2022   Ganglion cyst of volar aspect of left wrist 08/08/2022   Generalized anxiety disorder 01/02/2022   Perennial allergic rhinitis 01/02/2022   Atherosclerosis of native coronary artery of native heart without angina pectoris 01/02/2022   Atherosclerosis of both carotid arteries 01/02/2022   Restless leg syndrome 08/29/2021   DDD (degenerative disc disease), lumbosacral 08/29/2021   Estrogen deficiency 04/25/2021   Moderate episode of recurrent major depressive disorder (HCC) 03/18/2021   Chronic migraine without aura  without status migrainosus, not intractable 03/18/2021   Heart palpitations 05/14/2020   Coronary artery disease involving coronary bypass graft of native heart without angina pectoris 05/14/2020   Primary insomnia 05/14/2020   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   Unstable angina (HCC) 01/08/2019   Lumbar stenosis with neurogenic claudication 02/06/2017   Left sided sciatica 05/19/2015   Hypertension 04/09/2012   Hyperlipidemia 04/09/2012   Tobacco abuse 04/09/2012   Past Medical History:  Diagnosis Date   Allergic rhinitis    Anxiety    Bronchitis    Depression    Diverticulitis    GERD (gastroesophageal reflux disease)    H/O cesarean section    History of hiatal hernia    History of kidney stones    LEFT KIDNEY 2 STONES JUST WATCHING( [facility])   Hypercholesteremia    Hyperplastic colon polyp    12/30/2008   Hypertension    Migraine    Palpitations    asymptomatic 4 and 3 beat NSVT 06/2015 (normal stress and echo), 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   CORONARY ARTER(CABG) X2  LIMA to LAD and SVG TO OM1 01/09/19    Vitamin D deficiency     Family History  Problem Relation Age of Onset   Heart disease Mother        MI   Irritable bowel syndrome Mother    Migraines Mother    COPD Mother    Emphysema Mother    Cerebral aneurysm Father    Pancreatic cancer Father    AAA (abdominal aortic aneurysm) Sister    Breast cancer Paternal Grandmother    Lung cancer Paternal Grandfather    Colon cancer Neg Hx    Esophageal cancer Neg Hx    Rectal cancer Neg Hx    Stomach cancer Neg Hx     Past Surgical History:  Procedure Laterality Date   ABDOMINAL HYSTERECTOMY     partial   BREAST BIOPSY     CESAREAN SECTION     COLONOSCOPY     COLONOSCOPY N/A 07/07/2022   Procedure: COLONOSCOPY;  Surgeon: Jaynie Collins, DO;  Location: [facility]  ENDOSCOPY;  Service: Gastroenterology;  Laterality: N/A;   CORONARY ANGIOPLASTY     CORONARY ARTERY BYPASS GRAFT N/A 01/09/2019   Procedure: CORONARY ARTERY BYPASS GRAFTING (CABG) X2 USING LEFT INTERNAL MAMMARY ARTERY TO CIRC AND RIGHT GREATER SAPHENOUS VEIN TO LAD.;  Surgeon: Corliss Skains, MD;  Location: [facility] OR;  Service: Open Heart Surgery;  Laterality: N/A;   CORONARY ARTERY BYPASS GRAFT  ESOPHAGOGASTRODUODENOSCOPY N/A 07/07/2022   Procedure: ESOPHAGOGASTRODUODENOSCOPY (EGD);  Surgeon: Jaynie Collins, DO;  Location: [facility] ENDOSCOPY;  Service: Gastroenterology;  Laterality: N/A;   ETHMOIDECTOMY Bilateral 06/26/2014   Procedure: BILATERAL ANTERIOR ETHMOIDECTOMY;  Surgeon: Drema Halon, MD;  Location: [facility];  Service: ENT;  Laterality: Bilateral;   LEFT HEART CATH AND CORONARY ANGIOGRAPHY N/A 01/08/2019   Procedure: LEFT HEART CATH AND CORONARY ANGIOGRAPHY;  Surgeon: Elder Negus, MD;  Location: [facility];  Service: Cardiovascular;  Laterality: N/A;   MAXILLARY ANTROSTOMY Bilateral 06/26/2014   Procedure: BILATERAL MAXILLARY OSTEA ENLARGEMENT;  Surgeon: Drema Halon, MD;  Location: [facility];  Service: ENT;  Laterality: Bilateral;   NASAL SEPTOPLASTY W/ TURBINOPLASTY Bilateral 06/26/2014   Procedure: BILATERAL NASAL SEPTOPLASTY WITH TURBINATE REDUCTION;  Surgeon: Drema Halon, MD;  Location: [facility];  Service: ENT;  Laterality: Bilateral;   SHOULDER ARTHROSCOPY W/ ROTATOR CUFF REPAIR  08/2013   right   TEE WITHOUT CARDIOVERSION  01/09/2019   Procedure: Transesophageal Echocardiogram (Tee);  Surgeon: Corliss Skains, MD;  Location: [facility] OR;  Service: Open Heart Surgery;;   TONSILLECTOMY AND ADENOIDECTOMY     TUBAL LIGATION     Social History   Occupational History   Occupation: unemployed  Tobacco Use   Smoking status: Every Day    Packs/day: 1.00    Years: 35.00    Additional  pack years: 0.00    Total pack years: 35.00    Types: Cigarettes    Start date: 12/30/2018    Passive exposure: Past   Smokeless tobacco: Never   Tobacco comments:    Patient wants to quit smoking, smokes 15 cigarettes a day.  Vaping Use   Vaping Use: Never used  Substance and Sexual Activity   Alcohol use: No   Drug use: No   Sexual activity: Not Currently

## 2022-08-11 NOTE — Progress Notes (Signed)
Patient aware.

## 2022-08-12 ENCOUNTER — Ambulatory Visit: Payer: Medicare HMO | Admitting: Nurse Practitioner

## 2022-08-15 ENCOUNTER — Encounter: Payer: Self-pay | Admitting: Family Medicine

## 2022-09-02 ENCOUNTER — Telehealth: Payer: Self-pay | Admitting: Cardiology

## 2022-09-02 NOTE — Telephone Encounter (Signed)
Regina Perry, Regina Perry 2 hours ago AD Note In talking to pt about rescheduling visit, she stated the following "was mopping floor, started having chest pain, throat pain and felt like it was closing, she took an aspirin and sat down on steps".  She feels better now and was able to speak clearly.

## 2022-09-02 NOTE — Telephone Encounter (Signed)
In talking to pt about rescheduling visit, she stated the following "was mopping floor, started having chest pain, throat pain and felt like it was closing, she took an aspirin and sat down on steps".  She feels better now and was able to speak clearly.

## 2022-09-02 NOTE — Telephone Encounter (Signed)
Recent stress test in May 2024 was low risk. If she has worsening symptoms please have her go to the ED for further evaluation and management otherwise we will reevaluate at the next office visit.  Regina Perry [place], DO, [facility]

## 2022-09-09 ENCOUNTER — Ambulatory Visit: Payer: Medicare HMO | Admitting: Cardiology

## 2022-09-16 ENCOUNTER — Ambulatory Visit: Payer: Medicare HMO | Admitting: Cardiology

## 2022-09-16 ENCOUNTER — Encounter: Payer: Self-pay | Admitting: Cardiology

## 2022-09-16 VITALS — BP 141/83 | HR 53 | Resp 16 | Ht 63.0 in | Wt 134.6 lb

## 2022-09-16 DIAGNOSIS — I10 Essential (primary) hypertension: Secondary | ICD-10-CM

## 2022-09-16 DIAGNOSIS — I6523 Occlusion and stenosis of bilateral carotid arteries: Secondary | ICD-10-CM

## 2022-09-16 DIAGNOSIS — F17200 Nicotine dependence, unspecified, uncomplicated: Secondary | ICD-10-CM

## 2022-09-16 DIAGNOSIS — I714 Abdominal aortic aneurysm, without rupture, unspecified: Secondary | ICD-10-CM

## 2022-09-16 DIAGNOSIS — E782 Mixed hyperlipidemia: Secondary | ICD-10-CM

## 2022-09-16 DIAGNOSIS — Z951 Presence of aortocoronary bypass graft: Secondary | ICD-10-CM

## 2022-09-16 DIAGNOSIS — I2510 Atherosclerotic heart disease of native coronary artery without angina pectoris: Secondary | ICD-10-CM

## 2022-09-16 NOTE — Progress Notes (Signed)
[name] Date of Birth: [birth year] MRN: [id number] Primary Care Provider:Edstrom, Simmie Davies, PA Former Cardiology Providers: Altamese Fordoche, APRN, FNP-C Primary Cardiologist: Tessa Lerner, DO, [facility] (established care 04/22/2019) Primary cardiothoracic surgery: Dr. Cliffton Asters  Date: 09/16/22 Last Office Visit: 07/28/2022  Chief Complaint  Patient presents with   Atherosclerosis of native coronary artery of native heart w   Follow-up    HPI  Regina Perry is a 62 y.o.  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   Patient is being followed by the practice given her history of CAD status post two-vessel Bypass in 2020, abdominal aortic aneurysm, and carotid disease.  She presented to the office in May 2024 for evaluation of chest pain and dizziness.  Her symptoms of precordial discomfort were likely noncardiac but given her history that shared decision was to proceed with stress test for further evaluation and to also reevaluate her lipids.  She presents today for reevaluation.  Since last office visit her precordial discomfort has resolved and her lightheaded and dizziness is much improved.   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  Unfortunately she continues to smoke 3 to 4 cigarettes every other day.  She did start Zetia as recommended based on her last lipids follow-up labs are pending.  FUNCTIONAL STATUS: Walking 10,000 steps per day  ALLERGIES: Allergies  Allergen Reactions   Levofloxacin Nausea Only   Percocet [Oxycodone-Acetaminophen] Other (See Comments)    Sick on stomach when taken with anesthesia    MEDICATION LIST PRIOR TO VISIT: Current Outpatient  Medications on File Prior to Visit  Medication Sig Dispense Refill   acetaminophen (TYLENOL) 500 MG tablet Take 1,000 mg by mouth every 6 (six) hours as needed for moderate pain or headache.     clopidogrel (PLAVIX) 75 MG tablet TAKE 1 TABLET EVERY DAY 90 tablet 3   escitalopram (LEXAPRO) 20 MG tablet Take 1 tablet (20 mg total) by mouth at bedtime. 90 tablet 1   ezetimibe (ZETIA) 10 MG tablet Take 1 tablet (10 mg total) by mouth daily. 90 tablet 3   gabapentin (NEURONTIN) 300 MG capsule TAKE 1 CAPSULE THREE TIMES DAILY 270 capsule 3   HYDROcodone-acetaminophen (NORCO) 10-325 MG tablet Take 1 tablet by mouth every 6 (six) hours as needed for moderate pain.     loratadine (CLARITIN) 10 MG tablet Take 1 tablet (10 mg total) by mouth daily. 90 tablet 3   LORazepam (ATIVAN) 0.5 MG tablet Take 0.5 mg by mouth every 6 (six) hours as needed for anxiety.     losartan (COZAAR) 25 MG tablet TAKE 1 TABLET EVERY EVENING 90 tablet 3   metoprolol succinate (TOPROL-XL) 25 MG 24 hr tablet TAKE 1 TABLET EVERY DAY 90 tablet 3   nitroGLYCERIN (NITROSTAT) 0.4 MG SL tablet Place 1 tablet (0.4 mg total) under the tongue every 5 (five) minutes as needed for chest pain. 30 tablet 6   pantoprazole (PROTONIX) 40 MG tablet TAKE 1 TABLET EVERY DAY (Patient taking differently: Take 40 mg by mouth 2 (two) times daily. In the AM before breakfast and one before dinner.) 90 tablet 1   promethazine (PHENERGAN) 25 MG tablet Take 1 tablet (25 mg total) by mouth every 6 (six) hours as needed for nausea  or vomiting. 90 tablet 0   rosuvastatin (CRESTOR) 40 MG tablet Take 1 tablet (40 mg total) by mouth daily. 90 tablet 1   SUMAtriptan (IMITREX) 100 MG tablet TAKE 1 TABLET (100 MG TOTAL) BY MOUTH EVERY 2 (TWO) HOURS AS NEEDED FOR MIGRAINE OR HEADACHE. 27 tablet 3   terbinafine (LAMISIL) 250 MG tablet Take 1 tablet (250 mg total) by mouth daily. 72 tablet 0   No current facility-administered medications on file prior to visit.    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   CORONARY ARTER(CABG) X2  LIMA to LAD and SVG TO OM1 01/09/19    Vitamin D deficiency     PAST SURGICAL HISTORY: Past Surgical History:  Procedure Laterality Date   ABDOMINAL HYSTERECTOMY     partial   BREAST BIOPSY     CESAREAN SECTION     COLONOSCOPY     COLONOSCOPY N/A 07/07/2022   Procedure: COLONOSCOPY;  Surgeon: Jaynie Collins, DO;  Location: [facility] END[facility]COPY;  Service: Gastroenterology;  Laterality: N/A;   CORONARY ANGIOPLASTY     CORONARY ARTERY BYPASS GRAFT N/A 01/09/2019   Procedure: CORONARY ARTERY BYPASS GRAFTING (CABG) X2 USING LEFT INTERNAL MAMMARY ARTERY TO CIRC AND RIGHT GREATER SAPHENOUS VEIN TO LAD.;  Surgeon: Corliss Skains, MD;  Location: [facility] OR;  Service: Open Heart Surgery;  Laterality: N/A;   CORONARY ARTERY BYPASS GRAFT     ESOPHAGOGASTRODUODENOSCOPY N/A 07/07/2022   Procedure: ESOPHAGOGASTRODUODENOSCOPY (EGD);  Surgeon: Jaynie Collins, DO;  Location: [facility] ENDOSCOPY;  Service: Gastroenterology;  Laterality: N/A;   ETHMOIDECTOMY Bilateral 06/26/2014   Procedure: BILATERAL ANTERIOR ETHMOIDECTOMY;  Surgeon: Drema Halon, MD;  Location: [facility];  Service: ENT;  Laterality: Bilateral;   LEFT HEART CATH AND CORONARY ANGIOGRAPHY N/A 01/08/2019   Procedure: LEFT HEART CATH  AND CORONARY ANGIOGRAPHY;  Surgeon: Elder Negus, MD;  Location: [facility] INVASIVE CV LAB;  Service: Cardiovascular;  Laterality: N/A;   MAXILLARY ANTROSTOMY Bilateral 06/26/2014   Procedure: BILATERAL MAXILLARY OSTEA ENLARGEMENT;  Surgeon: Drema Halon, MD;  Location: [facility];  Service: ENT;  Laterality: Bilateral;   NASAL SEPTOPLASTY W/ TURBINOPLASTY Bilateral 06/26/2014   Procedure: BILATERAL NASAL SEPTOPLASTY WITH TURBINATE REDUCTION;  Surgeon: Drema Halon, MD;  Location: [facility];  Service: ENT;  Laterality: Bilateral;   SHOULDER ARTHROSCOPY W/ ROTATOR CUFF REPAIR  08/2013   right   TEE WITHOUT CARDIOVERSION  01/09/2019   Procedure: Transesophageal Echocardiogram (Tee);  Surgeon: Corliss Skains, MD;  Location: [facility] OR;  Service: Open Heart Surgery;;   TONSILLECTOMY AND ADENOIDECTOMY     TUBAL LIGATION      FAMILY HISTORY: The patient's family history includes AAA (abdominal aortic aneurysm)  in her sister; Breast cancer in her paternal grandmother; COPD in her mother; Cerebral aneurysm in her father; Emphysema in her mother; Heart disease in her mother; Irritable bowel syndrome in her mother; Lung cancer in her paternal grandfather; Migraines in her mother; Pancreatic cancer in her father.   SOCIAL HISTORY:  The patient  reports that she has been smoking cigarettes. She started smoking about 3 years ago. She has a 35.00 pack-year smoking history. She has been exposed to tobacco smoke. She has never used smokeless tobacco. She reports that she does not drink alcohol and does not use drugs.  Review of Systems  Cardiovascular:  Negative for chest pain, claudication, dyspnea on exertion, leg swelling, near-syncope, orthopnea, palpitations, paroxysmal nocturnal dyspnea and syncope.  Respiratory:  Negative for shortness of breath.   Neurological:  Positive for dizziness (better) and light-headedness (better).    PHYSICAL EXAM:     09/16/2022   11:17 AM 08/08/2022    9:01 AM 08/08/2022    8:16 AM  Vitals with BMI  Height 5\' 3"   5\' 3"   Weight 134 lbs 10 oz  135 lbs  BMI 23.85  23.92  Systolic 141 133 914  Diastolic 83 86 85  Pulse 53 63 62   Physical Exam  Constitutional: No distress.  Age appropriate, hemodynamically stable.   Neck: No JVD present.  Cardiovascular: Normal rate, regular rhythm, S1 normal, S2 normal, intact distal pulses and normal pulses. Exam reveals no gallop, no S3 and no S4.  No murmur heard. Pulmonary/Chest: Effort normal and breath sounds normal. No stridor. She has no wheezes. She has no rales.  Abdominal: Soft. Bowel sounds are normal. She exhibits no distension. There is no abdominal tenderness.  Musculoskeletal:        General: No edema.     Cervical back: Neck supple.  Neurological: She is alert and oriented to person, place, and time. She has intact cranial nerves (2-12).  Skin: Skin is warm and moist.   RADIOLOGY: Coronary CTA 12/25/18:  1. The left main is short and FFRct cannot be modeled in this region. 2. FFRct in the proximal LAD is mildly abnormal. However modeling demonstrates that opening the left main stenosis results in a significant improvement in flow in the proximal LAD. 3. FFRct demonstrates significant stenoses in the proximal and mid LAD. Based on modeling, opening the LM and proximal stenosis does not normalize flow more distally. 4. Recommend cardiac catheterization. Take note of concern for ostial left main disease.  CARDIAC DATABASE: Coronary artery bypass grafting: Year: 01/09/2019 Surgical anatomy: 2 vessel CABG: LIMA to LCx and SVG to LAD  EKG: Jul 28, 2022: Sinus bradycardia, 59 bpm, ST-T changes in the high lateral leads consider lateral ischemia.  When compared to January 2024 ST-T changes are more pronounced.  Echocardiogram: 09-Mar-2022: Normal LV systolic function with visual EF 60-65%. Left ventricle cavity is normal in size. Normal left  ventricular wall thickness. Normal global wall motion. Normal diastolic filling pattern, normal LAP.  Mild tricuspid regurgitation. No evidence of pulmonary hypertension. Mild pulmonic regurgitation. Abdominal aorta is dilated (30mm) with atherosclerosis. Compared to 01/09/2019: Mild TR and PR are new.   Stress Testing:  Exercise nuclear stress test 08/04/2022: Myocardial perfusion is normal. Overall LV systolic function is normal without regional wall motion abnormalities. Stress LV EF: 60%.  Normal ECG stress. The patient exercised for 8 minutes and 5 seconds of a Bruce protocol, achieving approximately 10.16 METs & 86% MPHR. No chest pain, stress terminated due  to THR and fatigue. 1 mm upsloping ST depression back to baseline at < 2 minutes into recovery with no recurrence at 3 min.  The baseline blood pressure was 110/70 mmHg and increased to 200/90 mmHg which is normal.  No change in EKG or perfusion response compared to 11/25/2019. Low risk.   Heart Catheterization: Cath 01/08/2019:  LM: Mid calcified 90% stenosis. LAD: Mid LAD focal calfied 60% stenosis after D2 LCx: Normal RCA: Ostial calcification without significant disease   LVEF normal LVEDP mildly elevated at 21 mmHg  Carotid artery duplex 01/12/2021:  Duplex suggests stenosis in the right internal carotid artery (50-69%).  Duplex suggests stenosis in the left internal carotid artery (16-49%). Antegrade right vertebral artery flow. Antegrade left vertebral artery flow. See report for additional details.  Carotid artery duplex 03/01/2022: Duplex suggests stenosis in the right internal carotid artery (50-69%).  <50% stenosis in the right external carotid artery. Duplex suggests stenosis in the left internal carotid artery (1-15%). Antegrade right vertebral artery flow. Antegrade left vertebral artery flow. Compared to the study done on 08/31/2021, there is mild progression of the disease on the left. Follow up in six  months is appropriate if clinically indicated.   Abdominal Aortic Duplex 08/31/2021: Moderate dilatation of the abdominal aorta is noted in the distal aorta. An abdominal aortic aneurysm measuring 3.3 x 3.1 x 3.1 cm is seen. There is ectasia noted in the proximal and mid abdominal aorta. The aorta is severely calcified throughout including common iliac arteries. Normal flow velocity in the aorta and bilateral CIA.  No significant change from 01/12/2021. Consider reevaluation in 2-3 years for stability.   LABORATORY DATA:    Latest Ref Rng & Units 04/15/2022   12:00 AM 03/31/2021    9:17 AM 01/06/2021    8:21 PM  CBC  WBC  7.5     8.4  7.8   Hemoglobin 12.0 - 16.0 13.9     14.8  13.6   Hematocrit 36 - 46 43     43.3  41.2   Platelets 150 - 400 K/uL 127     158  175      This result is from an external source.       Latest Ref Rng & Units 07/28/2022    2:50 PM 04/15/2022   12:00 AM 03/07/2022   10:59 AM  CMP  Glucose 70 - 99 mg/dL 96     BUN 6 - 24 mg/dL 11  14       Creatinine 0.57 - 1.00 mg/dL 8.11  1.0     9.14   Sodium 134 - 144 mmol/L 142  141       Potassium 3.5 - 5.2 mmol/L 5.1  4.0       Chloride 96 - 106 mmol/L 104  108       CO2 20 - 29 mmol/L 22     Calcium 8.7 - 10.2 mg/dL 9.5  8.9       Total Protein 6.0 - 8.5 g/dL 6.5     Total Bilirubin 0.0 - 1.2 mg/dL 0.5     Alkaline Phos 44 - 121 IU/L 73  56       AST 0 - 40 IU/L 19  15       ALT 0 - 32 IU/L 12  11          This result is from an external source.    Lipid Panel  Lab Results  Component Value Date  CHOL 148 07/28/2022   HDL 60 07/28/2022   LDLCALC 69 07/28/2022   LDLDIRECT 67 07/28/2022   TRIG 106 07/28/2022   CHOLHDL 2.6 03/31/2021     Lab Results  Component Value Date   HGBA1C 5.7 (H) 03/31/2021   HGBA1C 6.0 (H) 01/08/2019   HGBA1C 5.6 04/10/2012   No components found for: "NTPROBNP" Lab Results  Component Value Date   TSH 1.20 04/15/2022   TSH 2.730 03/31/2021    Cardiac Panel (last 3  results) No results for input(s): "CKTOTAL", "CKMB", "TROPONINIHS", "RELINDX" in the last 72 hours.  IMPRESSION:    ICD-10-CM   1. Atherosclerosis of native coronary artery of native heart without angina pectoris  I25.10 LDL cholesterol, direct    2. S/P CABG x 2  Z95.1 LDL cholesterol, direct    3. Abdominal aortic aneurysm (AAA) without rupture, unspecified part (HCC)  I71.40     4. Asymptomatic bilateral carotid artery stenosis  I65.23     5. Mixed hyperlipidemia  E78.2     6. Essential hypertension  I10     7. Tobacco use disorder  F17.200        RECOMMENDATIONS: JHANIA ETHERINGTON is a 62 y.o. female whose past medical history and cardiovascular risk factors include: Hypertension, hyperlipidemia, tobacco use disorder, asymptomatic nonsustained ventricular tachycardia noted on event monitor in 2017, atherosclerosis of the native coronary arteries status post two-vessel CABG 12/2018.   Atherosclerosis of native coronary artery of native heart without angina pectoris  S/P CABG x 2 Precordial pain has now resolved. At the last office visit given her symptoms, risk factors, chronic comorbid conditions, and EKG findings were more pronounced compared to prior tracings therefore stress test was recommended. Patient underwent exercise nuclear stress test and achieved 10.16 METS and myocardial perfusion was reported to be normal. Reemphasized importance of complete smoking cessation. Patient working on improving her modifiable cardiovascular risk factors. LDL is currently not at goal based on last lipids.  She has started Zetia and has been taking it for the last 6 weeks.  Will repeat lipids to reevaluate LDL  Abdominal aortic aneurysm (AAA) without rupture, unspecified part (HCC) Continue antiplatelet therapy and statin. Most recent abdominal aortic duplex notes some dilatation compared to prior study. Recommend repeat follow-up study in 2 years. Repeat study ordered for June  2025.  Asymptomatic bilateral carotid artery stenosis Asymptomatic. Disease in the right ICA >left ICA. Follow-up study scheduled for December 2024. Continue antiplatelets, statin therapy, reemphasized importance of smoking cessation.  Mixed hyperlipidemia Currently on rosuvastatin.   She denies myalgia or other side effects. Most recent lipid profile from May 2024 notes a direct LDL of 67 mg/dL. Added Zetia 10 mg p.o. daily. Follow-up lipids pending. Recommended goal LDL <55 mg/dL given her underlying CAD status post CABG, abdominal aortic aneurysm, and carotid atherosclerosis.  Essential hypertension Medications reconciled. No changes warranted at this time  Tobacco use disorder Tobacco cessation counseling: Currently smoking 3 to 4 cigarettes every other day Patient is willing to quit at this time -has had difficulty in the past. She is refusing to be started on pharmacological therapy.  I will reassess her progress at the next follow-up visit  Since last office visit dizziness has improved significantly.  She did not follow-up with ENT but did get reevaluated by PCP.  Will defer management to PCP going forward.  FINAL MEDICATION LIST END OF ENCOUNTER: No orders of the defined types were placed in this encounter.   Current Outpatient Medications:  acetaminophen (TYLENOL) 500 MG tablet, Take 1,000 mg by mouth every 6 (six) hours as needed for moderate pain or headache., Disp: , Rfl:    clopidogrel (PLAVIX) 75 MG tablet, TAKE 1 TABLET EVERY DAY, Disp: 90 tablet, Rfl: 3   escitalopram (LEXAPRO) 20 MG tablet, Take 1 tablet (20 mg total) by mouth at bedtime., Disp: 90 tablet, Rfl: 1   ezetimibe (ZETIA) 10 MG tablet, Take 1 tablet (10 mg total) by mouth daily., Disp: 90 tablet, Rfl: 3   gabapentin (NEURONTIN) 300 MG capsule, TAKE 1 CAPSULE THREE TIMES DAILY, Disp: 270 capsule, Rfl: 3   HYDROcodone-acetaminophen (NORCO) 10-325 MG tablet, Take 1 tablet by mouth every 6 (six) hours  as needed for moderate pain., Disp: , Rfl:    loratadine (CLARITIN) 10 MG tablet, Take 1 tablet (10 mg total) by mouth daily., Disp: 90 tablet, Rfl: 3   LORazepam (ATIVAN) 0.5 MG tablet, Take 0.5 mg by mouth every 6 (six) hours as needed for anxiety., Disp: , Rfl:    losartan (COZAAR) 25 MG tablet, TAKE 1 TABLET EVERY EVENING, Disp: 90 tablet, Rfl: 3   metoprolol succinate (TOPROL-XL) 25 MG 24 hr tablet, TAKE 1 TABLET EVERY DAY, Disp: 90 tablet, Rfl: 3   nitroGLYCERIN (NITROSTAT) 0.4 MG SL tablet, Place 1 tablet (0.4 mg total) under the tongue every 5 (five) minutes as needed for chest pain., Disp: 30 tablet, Rfl: 6   pantoprazole (PROTONIX) 40 MG tablet, TAKE 1 TABLET EVERY DAY (Patient taking differently: Take 40 mg by mouth 2 (two) times daily. In the AM before breakfast and one before dinner.), Disp: 90 tablet, Rfl: 1   promethazine (PHENERGAN) 25 MG tablet, Take 1 tablet (25 mg total) by mouth every 6 (six) hours as needed for nausea or vomiting., Disp: 90 tablet, Rfl: 0   rosuvastatin (CRESTOR) 40 MG tablet, Take 1 tablet (40 mg total) by mouth daily., Disp: 90 tablet, Rfl: 1   SUMAtriptan (IMITREX) 100 MG tablet, TAKE 1 TABLET (100 MG TOTAL) BY MOUTH EVERY 2 (TWO) HOURS AS NEEDED FOR MIGRAINE OR HEADACHE., Disp: 27 tablet, Rfl: 3   terbinafine (LAMISIL) 250 MG tablet, Take 1 tablet (250 mg total) by mouth daily., Disp: 72 tablet, Rfl: 0  No orders of the defined types were placed in this encounter.  --Continue cardiac medications as reconciled in final medication list. --Return in about 27 weeks (around 03/24/2023) for Follow up, Carotid disease, CAD. Or sooner if needed. --Continue follow-up with your primary care physician regarding the management of your other chronic comorbid conditions.  Patient's questions and concerns were addressed to her satisfaction. She voices understanding of the instructions provided during this encounter.   This note was created using a voice recognition  software as a result there may be grammatical errors inadvertently enclosed that do not reflect the nature of this encounter. Every attempt is made to correct such errors.  Tessa Lerner, [place], [facility]  Pager:  [phone] Office: [phone]

## 2022-09-19 ENCOUNTER — Ambulatory Visit (INDEPENDENT_AMBULATORY_CARE_PROVIDER_SITE_OTHER): Payer: Medicare HMO | Admitting: Family Medicine

## 2022-09-19 ENCOUNTER — Encounter: Payer: Self-pay | Admitting: Family Medicine

## 2022-09-19 VITALS — BP 133/73 | HR 75 | Resp 18 | Ht 63.0 in | Wt 134.0 lb

## 2022-09-19 DIAGNOSIS — I10 Essential (primary) hypertension: Secondary | ICD-10-CM

## 2022-09-19 DIAGNOSIS — I2510 Atherosclerotic heart disease of native coronary artery without angina pectoris: Secondary | ICD-10-CM

## 2022-09-19 DIAGNOSIS — F331 Major depressive disorder, recurrent, moderate: Secondary | ICD-10-CM

## 2022-09-19 DIAGNOSIS — I6523 Occlusion and stenosis of bilateral carotid arteries: Secondary | ICD-10-CM

## 2022-09-19 DIAGNOSIS — F411 Generalized anxiety disorder: Secondary | ICD-10-CM | POA: Diagnosis not present

## 2022-09-19 DIAGNOSIS — E782 Mixed hyperlipidemia: Secondary | ICD-10-CM | POA: Diagnosis not present

## 2022-09-19 MED ORDER — ROSUVASTATIN CALCIUM 40 MG PO TABS
40.0000 mg | ORAL_TABLET | Freq: Every day | ORAL | 1 refills | Status: DC
Start: 2022-09-19 — End: 2022-10-26

## 2022-09-19 NOTE — Progress Notes (Signed)
Established Patient Office Visit  Subjective   Patient ID: Regina Perry, female    DOB: 1960-09-16  Age: 62 y.o. MRN: [id number]  Chief Complaint  Patient presents with   Anxiety   Depression    HPI Regina Perry is a 62 y.o. female presenting today for follow up of mood. Mood: Patient is here to follow up for anxiety and depression.at last appointment, restarted Lexapro recommending 10 mg once daily for 1 week before increasing to previous dose of Lexapro 20 mg daily.  Taking medication without side effects, reports excellent compliance with treatment. Denies mood changes or SI/HI. She feels mood is improved since last visit.  She is still experiencing very stressful situation in her life, she notes that it is she is handling it much better than she would without the Lexapro. Denies chest pain, difficulty concentrating, dizziness, fatigue, insomnia, irritability, palpitations, panic attacks, racing thoughts, SOB, sweating. Denies anhedonia, depressed mood, difficulty concentrating, fatigue, feelings of worthlessness/guilt, hopelessness, hypersomnia, impaired memory, insomnia, psychomotor agitation, psychomotor retardation, recurrent thoughts of death, weight changes.     October 10, 2022    2:00 PM 08/08/2022    8:36 AM 11/16/2021    2:03 PM  Depression screen PHQ 2/9  Decreased Interest 0 3 1  Down, Depressed, Hopeless 0 3 2  PHQ - 2 Score 0 6 3  Altered sleeping 1 1 1   Tired, decreased energy 1 3 1   Change in appetite 1 1 1   Feeling bad or failure about yourself  0 2 1  Trouble concentrating 0 1 1  Moving slowly or fidgety/restless 0 1 0  Suicidal thoughts 0 0 0  PHQ-9 Score 3 15 8   Difficult doing work/chores Somewhat difficult Very difficult        10/10/22    2:00 PM 08/08/2022    8:37 AM 11/16/2021    2:03 PM 08/26/2021    3:09 PM  GAD 7 : Generalized Anxiety Score  Nervous, Anxious, on Edge 1 3 2 1   Control/stop worrying 1 2 2 1   Worry too much - different things 1 3 2 1    Trouble relaxing 0 2 1 0  Restless 0 1 1 0  Easily annoyed or irritable 1 3 1  0  Afraid - awful might happen 2 3 2 1   Total GAD 7 Score 6 17 11 4   Anxiety Difficulty Somewhat difficult Very difficult     ROS Negative unless otherwise noted in HPI   Objective:     BP 133/73 (BP Location: Left Arm, Patient Position: Sitting, Cuff Size: Normal)   Pulse 75   Resp 18   Ht 5\' 3"  (1.6 m)   Wt 134 lb (60.8 kg)   SpO2 96%   BMI 23.74 kg/m   Physical Exam Constitutional:      General: She is not in acute distress.    Appearance: Normal appearance.  HENT:     Head: Normocephalic and atraumatic.  Pulmonary:     Effort: Pulmonary effort is normal. No respiratory distress.  Musculoskeletal:     Cervical back: Normal range of motion.  Neurological:     General: No focal deficit present.     Mental Status: She is alert and oriented to person, place, and time. Mental status is at baseline.  Psychiatric:        Mood and Affect: Mood normal.        Thought Content: Thought content normal.        Judgment: Judgment normal.  Assessment & Plan:  Generalized anxiety disorder Assessment & Plan: GAD-7 score improved from 17 to 6.  Continue Lexapro 20 mg daily.  Follow-up in 6 weeks to ensure no changes necessary in medication regimen for mood or hypertension.   Moderate episode of recurrent major depressive disorder (HCC) Assessment & Plan: PHQ-9 score improved from 15 to 3. Continue Lexapro 20 mg daily.  Follow-up in 6 weeks to ensure no changes necessary in medication regimen for mood or hypertension.   Primary hypertension Assessment & Plan: Blood pressure goal <130/80 given history CAD.  Blood pressure essentially at goal on recheck, 133/73.  Encouraged to continue ambulatory blood pressure monitoring, low-sodium diet, and taking time for herself to continue to reduce stress.  Continue losartan 25 mg daily.   Mixed hyperlipidemia -     Rosuvastatin Calcium; Take 1 tablet (40  mg total) by mouth daily.  Dispense: 90 tablet; Refill: 1  Atherosclerosis of both carotid arteries -     Rosuvastatin Calcium; Take 1 tablet (40 mg total) by mouth daily.  Dispense: 90 tablet; Refill: 1  Atherosclerosis of native coronary artery of native heart without angina pectoris -     Rosuvastatin Calcium; Take 1 tablet (40 mg total) by mouth daily.  Dispense: 90 tablet; Refill: 1    Return in about 6 weeks (around 10/31/2022) for follow-up for mood and HTN.    Melida Quitter, PA

## 2022-09-19 NOTE — Assessment & Plan Note (Addendum)
Blood pressure goal <130/80 given history CAD.  Blood pressure essentially at goal on recheck, 133/73.  Encouraged to continue ambulatory blood pressure monitoring, low-sodium diet, and taking time for herself to continue to reduce stress.  Continue losartan 25 mg daily.

## 2022-09-19 NOTE — Assessment & Plan Note (Signed)
GAD-7 score improved from 17 to 6.  Continue Lexapro 20 mg daily.  Follow-up in 6 weeks to ensure no changes necessary in medication regimen for mood or hypertension.

## 2022-09-19 NOTE — Patient Instructions (Signed)
Keep checking your blood pressure at home and I will see you in 6 weeks.  I am hopeful that your blood pressure will remain at your goal of less than 130 on top and less than 80 on the bottom.  Take care of yourself and I will see you at your next appointment!

## 2022-09-19 NOTE — Assessment & Plan Note (Signed)
PHQ-9 score improved from 15 to 3. Continue Lexapro 20 mg daily.  Follow-up in 6 weeks to ensure no changes necessary in medication regimen for mood or hypertension.

## 2022-10-06 ENCOUNTER — Other Ambulatory Visit: Payer: Self-pay | Admitting: Cardiology

## 2022-10-06 DIAGNOSIS — K219 Gastro-esophageal reflux disease without esophagitis: Secondary | ICD-10-CM

## 2022-10-11 NOTE — Telephone Encounter (Signed)
Called patient to check up on her to see how she's been patient mention she is going to a lot of stress due that her father has cancer again but she has not felt like she did last time

## 2022-10-12 NOTE — Progress Notes (Unsigned)
10/13/2022 10:33 AM   [name] 12/16/1960 [id number]  Referring provider: Carlean Jews, NP [address] [place],  [place] [address]  Urological history: 1.  High risk hematuria -Smoker -CTU (08/2021) - 10 mm left renal stone -cysto (09/2021) - NED  2.  Nephrolithiasis -CTU (08/2021) - 4 mm left renal stone   Chief Complaint  Patient presents with   Nephrolithiasis    HPI: SAHNNON DEMSKE is a 62 y.o. female who presents today for yearly follow up.   Previous records reviewed.   She continues to have intermittent episodes of gross hematuria.  She did have a urinary tract infection in May with E. coli that was pansensitive.  Patient denies any modifying or aggravating factors.  Patient denies any dysuria or suprapubic/flank pain.  Patient denies any fevers, chills, nausea or vomiting.   KUB stable 10 mm left lower pole stone   UA yellow slightly cloudy, trace ketone, specific gravity 1.025, 2+ blood, pH 5.5, +1 protein, 0-5 WBCs, 3-10 RBC's and 0-10 epithelial cells  PMH: Past Medical History:  Diagnosis Date   Allergic rhinitis    Anxiety    Bronchitis    Depression    Diverticulitis    GERD (gastroesophageal reflux disease)    H/O cesarean section    History of hiatal hernia    History of kidney stones    LEFT KIDNEY 2 STONES JUST WATCHING( [facility])   Hypercholesteremia    Hyperplastic colon polyp    12/30/2008   Hypertension    Migraine    Palpitations    asymptomatic 4 and 3 beat NSVT 06/2015 (normal stress and echo), 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   CORONARY ARTER(CABG) X2  LIMA to LAD and SVG TO OM1 01/09/19    Vitamin D deficiency     Surgical History: Past Surgical History:  Procedure Laterality Date   ABDOMINAL HYSTERECTOMY     partial   BREAST BIOPSY     CESAREAN SECTION      COLONOSCOPY     COLONOSCOPY N/A 07/07/2022   Procedure: COLONOSCOPY;  Surgeon: Jaynie Collins, DO;  Location: [facility] ENDOSCOPY;  Service: Gastroenterology;  Laterality: N/A;   CORONARY ANGIOPLASTY     CORONARY ARTERY BYPASS GRAFT N/A 01/09/2019   Procedure: CORONARY ARTERY BYPASS GRAFTING (CABG) X2 USING LEFT INTERNAL MAMMARY ARTERY TO CIRC AND RIGHT GREATER SAPHENOUS VEIN TO LAD.;  Surgeon: Corliss Skains, MD;  Location: [facility] OR;  Service: Open Heart Surgery;  Laterality: N/A;   CORONARY ARTERY BYPASS GRAFT     ESOPHAGOGASTRODUODENOSCOPY N/A 07/07/2022   Procedure: ESOPHAGOGASTRODUODENOSCOPY (EGD);  Surgeon: Jaynie Collins, DO;  Location: [facility] ENDOSCOPY;  Service: Gastroenterology;  Laterality: N/A;   ETHMOIDECTOMY Bilateral 06/26/2014   Procedure: BILATERAL ANTERIOR ETHMOIDECTOMY;  Surgeon: Drema Halon, MD;  Location: [facility];  Service: ENT;  Laterality: Bilateral;   LEFT HEART CATH AND CORONARY ANGIOGRAPHY N/A 01/08/2019   Procedure: LEFT HEART CATH AND CORONARY ANGIOGRAPHY;  Surgeon: Elder Negus, MD;  Location: [facility] INVASIVE CV LAB;  Service: Cardiovascular;  Laterality: N/A;   MAXILLARY ANTROSTOMY Bilateral 06/26/2014   Procedure: BILATERAL MAXILLARY OSTEA ENLARGEMENT;  Surgeon: Drema Halon, MD;  Location: [facility];  Service: ENT;  Laterality: Bilateral;   NASAL SEPTOPLASTY W/ TURBINOPLASTY Bilateral  06/26/2014   Procedure: BILATERAL NASAL SEPTOPLASTY WITH TURBINATE REDUCTION;  Surgeon: Drema Halon, MD;  Location: [facility];  Service: ENT;  Laterality: Bilateral;   SHOULDER ARTHROSCOPY W/ ROTATOR CUFF REPAIR  08/2013   right   TEE WITHOUT CARDIOVERSION  01/09/2019   Procedure: Transesophageal Echocardiogram (Tee);  Surgeon: Corliss Skains, MD;  Location: [facility] OR;  Service: Open Heart Surgery;;   TONSILLECTOMY AND ADENOIDECTOMY     TUBAL LIGATION      Home Medications:  Allergies as of  10/13/2022       Reactions   Levofloxacin Nausea Only   Percocet [oxycodone-acetaminophen] Other (See Comments)   Sick on stomach when taken with anesthesia         Medication List        Accurate as of October 13, 2022 10:33 AM. If you have any questions, ask your nurse or doctor.          acetaminophen 500 MG tablet Commonly known as: TYLENOL Take 1,000 mg by mouth every 6 (six) hours as needed for moderate pain or headache.   clopidogrel 75 MG tablet Commonly known as: PLAVIX TAKE 1 TABLET EVERY DAY   escitalopram 20 MG tablet Commonly known as: LEXAPRO Take 1 tablet (20 mg total) by mouth at bedtime.   ezetimibe 10 MG tablet Commonly known as: ZETIA Take 1 tablet (10 mg total) by mouth daily.   gabapentin 300 MG capsule Commonly known as: NEURONTIN TAKE 1 CAPSULE THREE TIMES DAILY   HYDROcodone-acetaminophen 10-325 MG tablet Commonly known as: NORCO Take 1 tablet by mouth every 6 (six) hours as needed for moderate pain.   loratadine 10 MG tablet Commonly known as: CLARITIN Take 1 tablet (10 mg total) by mouth daily.   LORazepam 0.5 MG tablet Commonly known as: ATIVAN Take 0.5 mg by mouth every 6 (six) hours as needed for anxiety.   losartan 25 MG tablet Commonly known as: COZAAR TAKE 1 TABLET EVERY EVENING   metoprolol succinate 25 MG 24 hr tablet Commonly known as: TOPROL-XL TAKE 1 TABLET EVERY DAY   nitroGLYCERIN 0.4 MG SL tablet Commonly known as: NITROSTAT Place 1 tablet (0.4 mg total) under the tongue every 5 (five) minutes as needed for chest pain.   pantoprazole 40 MG tablet Commonly known as: PROTONIX TAKE 1 TABLET EVERY DAY   promethazine 25 MG tablet Commonly known as: PHENERGAN Take 1 tablet (25 mg total) by mouth every 6 (six) hours as needed for nausea or vomiting.   rosuvastatin 40 MG tablet Commonly known as: CRESTOR Take 1 tablet (40 mg total) by mouth daily.   SUMAtriptan 100 MG tablet Commonly known as: IMITREX TAKE 1  TABLET (100 MG TOTAL) BY MOUTH EVERY 2 (TWO) HOURS AS NEEDED FOR MIGRAINE OR HEADACHE.   terbinafine 250 MG tablet Commonly known as: LAMISIL Take 1 tablet (250 mg total) by mouth daily.        Allergies:  Allergies  Allergen Reactions   Levofloxacin Nausea Only   Percocet [Oxycodone-Acetaminophen] Other (See Comments)    Sick on stomach when taken with anesthesia     Family History: Family History  Problem Relation Age of Onset   Heart disease Mother        MI   Irritable bowel syndrome Mother    Migraines Mother    COPD Mother    Emphysema Mother    Cerebral aneurysm Father    Pancreatic cancer Father    AAA (abdominal aortic aneurysm) Sister  Breast cancer Paternal Grandmother    Lung cancer Paternal Grandfather    Colon cancer Neg Hx    Esophageal cancer Neg Hx    Rectal cancer Neg Hx    Stomach cancer Neg Hx     Social History:  reports that she has been smoking cigarettes. She started smoking about 3 years ago. She has a 35 pack-year smoking history. She has been exposed to tobacco smoke. She has never used smokeless tobacco. She reports that she does not drink alcohol and does not use drugs.  ROS: Pertinent ROS in HPI  Physical Exam: BP 134/76   Pulse 72   Ht 5' (1.524 m)   Wt 146 lb (66.2 kg)   BMI 28.51 kg/m   Constitutional:  Well nourished. Alert and oriented, No acute distress. HEENT: [place] AT, moist mucus membranes.  Trachea midline Cardiovascular: No clubbing, cyanosis, or edema. Respiratory: Normal respiratory effort, no increased work of breathing. Neurologic: Grossly intact, no focal deficits, moving all 4 extremities. Psychiatric: Normal mood and affect.    Laboratory Data: Lab Results  Component Value Date   WBC 7.5 04/15/2022   HGB 13.9 04/15/2022   HCT 43 04/15/2022   MCV 89 03/31/2021   PLT 127 (A) 04/15/2022    Lab Results  Component Value Date   CREATININE 0.95 07/28/2022   Lab Results  Component Value Date   TSH 1.20  04/15/2022       Component Value Date/Time   CHOL 148 07/28/2022 1450   HDL 60 07/28/2022 1450   CHOLHDL 2.6 03/31/2021 0917   CHOLHDL 4.7 04/10/2012 0414   VLDL 50 (H) 04/10/2012 0414   LDLCALC 69 07/28/2022 1450    Lab Results  Component Value Date   AST 19 07/28/2022   Lab Results  Component Value Date   ALT 12 07/28/2022    Urinalysis Results for orders placed or performed in visit on 10/13/22  Microscopic Examination   Urine  Result Value Ref Range   WBC, UA 0-5 0 - 5 /hpf   RBC, Urine 3-10 (A) 0 - 2 /hpf   Epithelial Cells (non renal) 0-10 0 - 10 /hpf   Bacteria, UA None seen None seen/Few  Urinalysis, Complete  Result Value Ref Range   Specific Gravity, UA 1.025 1.005 - 1.030   pH, UA 5.5 5.0 - 7.5   Color, UA Yellow Yellow   Appearance Ur Hazy (A) Clear   Leukocytes,UA Negative Negative   Protein,UA 1+ (A) Negative/Trace   Glucose, UA Negative Negative   Ketones, UA Trace (A) Negative   RBC, UA 2+ (A) Negative   Bilirubin, UA Negative Negative   Urobilinogen, Ur 1.0 0.2 - 1.0 mg/dL   Nitrite, UA Negative Negative   Microscopic Examination See below:   I have reviewed the labs.   Pertinent Imaging: N/A  Assessment & Plan:    1.  High risk hematuria -Smoker  -Workup in 2023 found 4 mm left renal stone -Reports of gross hematuria -UA 3-10 RBC's -Explained that with her recurrent episodes of intermittent gross hematuria we should repeat a cystoscopy as she is at high risk for bladder cancer with her smoking history and also schedule renal ultrasound for upper tract imaging -She is in agreement and would like to be scheduled for these procedures -Renal ultrasound orders are placed, advised patient that she will be contacted by the scheduling department to schedule this appointment -She will return for the renal ultrasound report and cystoscopy with Dr. Apolinar Junes  2. Nephrolithiasis -CTU (2023) - 10 mm left renal stone -KUB stable 10 mm stone    Return for RUS report and cystoscopy for persistent, intermittent gross heme.  These notes generated with voice recognition software. I apologize for typographical errors.  Cloretta Ned  [facility] [address]  [address] [place], [place] [address] [phone]

## 2022-10-13 ENCOUNTER — Ambulatory Visit: Payer: Medicare HMO | Admitting: Urology

## 2022-10-13 ENCOUNTER — Ambulatory Visit
Admission: RE | Admit: 2022-10-13 | Discharge: 2022-10-13 | Disposition: A | Discharge status: Home / Self Care | Payer: Medicare HMO | Source: Ambulatory Visit | Attending: Urology | Admitting: Urology

## 2022-10-13 ENCOUNTER — Other Ambulatory Visit: Payer: Self-pay

## 2022-10-13 ENCOUNTER — Encounter: Payer: Self-pay | Admitting: Urology

## 2022-10-13 ENCOUNTER — Ambulatory Visit
Admission: RE | Admit: 2022-10-13 | Discharge: 2022-10-13 | Disposition: A | Discharge status: Home / Self Care | Payer: Medicare HMO | Attending: Urology | Admitting: Urology

## 2022-10-13 VITALS — BP 134/76 | HR 72 | Ht 60.0 in | Wt 146.0 lb

## 2022-10-13 DIAGNOSIS — R319 Hematuria, unspecified: Secondary | ICD-10-CM

## 2022-10-13 DIAGNOSIS — Z87442 Personal history of urinary calculi: Secondary | ICD-10-CM

## 2022-10-13 DIAGNOSIS — R310 Gross hematuria: Secondary | ICD-10-CM

## 2022-10-13 DIAGNOSIS — N200 Calculus of kidney: Secondary | ICD-10-CM | POA: Diagnosis not present

## 2022-10-13 LAB — URINALYSIS, COMPLETE
Bilirubin, UA: NEGATIVE
Glucose, UA: NEGATIVE
Leukocytes,UA: NEGATIVE
Nitrite, UA: NEGATIVE
Specific Gravity, UA: 1.025 (ref 1.005–1.030)
Urobilinogen, Ur: 1 mg/dL (ref 0.2–1.0)
pH, UA: 5.5 (ref 5.0–7.5)

## 2022-10-13 LAB — MICROSCOPIC EXAMINATION: Bacteria, UA: NONE SEEN

## 2022-10-19 ENCOUNTER — Other Ambulatory Visit: Payer: Self-pay | Admitting: Family Medicine

## 2022-10-19 DIAGNOSIS — R82998 Other abnormal findings in urine: Secondary | ICD-10-CM

## 2022-10-19 DIAGNOSIS — R300 Dysuria: Secondary | ICD-10-CM

## 2022-10-25 ENCOUNTER — Other Ambulatory Visit: Payer: Self-pay | Admitting: Family Medicine

## 2022-10-25 DIAGNOSIS — R82998 Other abnormal findings in urine: Secondary | ICD-10-CM

## 2022-10-25 DIAGNOSIS — R300 Dysuria: Secondary | ICD-10-CM

## 2022-10-26 ENCOUNTER — Other Ambulatory Visit: Payer: Self-pay | Admitting: Cardiology

## 2022-10-26 DIAGNOSIS — E782 Mixed hyperlipidemia: Secondary | ICD-10-CM

## 2022-10-26 DIAGNOSIS — I6523 Occlusion and stenosis of bilateral carotid arteries: Secondary | ICD-10-CM

## 2022-10-26 DIAGNOSIS — I2510 Atherosclerotic heart disease of native coronary artery without angina pectoris: Secondary | ICD-10-CM

## 2022-10-27 ENCOUNTER — Ambulatory Visit
Admission: RE | Admit: 2022-10-27 | Discharge: 2022-10-27 | Disposition: A | Discharge status: Home / Self Care | Payer: Medicare HMO | Source: Ambulatory Visit | Attending: Urology | Admitting: Urology

## 2022-10-27 DIAGNOSIS — R310 Gross hematuria: Secondary | ICD-10-CM | POA: Diagnosis present

## 2022-11-08 ENCOUNTER — Ambulatory Visit: Payer: Medicare HMO | Admitting: Urology

## 2022-11-08 VITALS — Ht 60.0 in | Wt 146.0 lb

## 2022-11-08 DIAGNOSIS — Z87442 Personal history of urinary calculi: Secondary | ICD-10-CM

## 2022-11-08 DIAGNOSIS — N200 Calculus of kidney: Secondary | ICD-10-CM | POA: Diagnosis not present

## 2022-11-08 DIAGNOSIS — R310 Gross hematuria: Secondary | ICD-10-CM

## 2022-11-08 DIAGNOSIS — Z720 Tobacco use: Secondary | ICD-10-CM

## 2022-11-08 LAB — URINALYSIS, COMPLETE
Bilirubin, UA: NEGATIVE
Glucose, UA: NEGATIVE
Ketones, UA: NEGATIVE
Leukocytes,UA: NEGATIVE
Nitrite, UA: NEGATIVE
Protein,UA: NEGATIVE
Specific Gravity, UA: 1.015 (ref 1.005–1.030)
Urobilinogen, Ur: 0.2 mg/dL (ref 0.2–1.0)
pH, UA: 6 (ref 5.0–7.5)

## 2022-11-08 LAB — MICROSCOPIC EXAMINATION

## 2022-11-08 NOTE — Progress Notes (Signed)
   11/08/22  CC:  Chief Complaint  Patient presents with   Cysto    HPI: 62 year old female with percentage of high risk hematuria having persistent episodes of blood in the urine he returns today for reevaluation.  In the interim, she went renal bladder ultrasound showing a stable lower pole kidney stone from her previous CT urogram a year ago.  No other upper tract pathology.  Height 5' (1.524 m), weight 146 lb (66.2 kg). NED. A&Ox3.   No respiratory distress   Abd soft, NT, ND Normal external genitalia with patent urethral meatus  Cystoscopy Procedure Note  Patient identification was confirmed, informed consent was obtained, and patient was prepped using Betadine solution.  Lidocaine jelly was administered per urethral meatus.    Procedure: - Flexible cystoscope introduced, without any difficulty.   - Thorough search of the bladder revealed:    normal urethral meatus    normal urothelium    no stones    no ulcers     no tumors    no urethral polyps    no trabeculation  - Ureteral orifices were normal in position and appearance.  Post-Procedure: - Patient tolerated the procedure well  Assessment/ Plan:  1. History of kidney stones Stable, asymptomatic  We did have a brief discussion today about whether or not to treat this stone, her symptoms are not consistent with a stone and as such, would not recommend treatment, can continue to follow - Urinalysis, Complete  2. Gross hematuria Unclear etiology.  On further discussion today, she may just be having concentrated urine in the morning.  She does in fact though have persistent microscopic hematuria, 11-30 red blood cells per high-powered field.  As long as she is smoking, prefer to continue to follow her closely specially see if she has frankly red urine with clots.  Reevaluate in 1 year with renal ultrasound.  3. Tobacco abuse Continue to report tobacco abuse.  Reports stressors with her father going into  hospice.  Expressed my concern for her overall overall wellbeing and health, encourage cessation as possible.   F/u 1 year with UA  Vanna Scotland, MD

## 2022-11-09 ENCOUNTER — Other Ambulatory Visit: Payer: Self-pay | Admitting: Family Medicine

## 2022-11-09 DIAGNOSIS — B351 Tinea unguium: Secondary | ICD-10-CM

## 2022-11-15 ENCOUNTER — Other Ambulatory Visit: Payer: Self-pay | Admitting: *Deleted

## 2022-11-15 ENCOUNTER — Ambulatory Visit (INDEPENDENT_AMBULATORY_CARE_PROVIDER_SITE_OTHER): Payer: Medicare HMO | Admitting: Family Medicine

## 2022-11-15 ENCOUNTER — Encounter: Payer: Self-pay | Admitting: Family Medicine

## 2022-11-15 VITALS — BP 141/85 | HR 57 | Resp 18 | Ht 60.0 in | Wt 133.0 lb

## 2022-11-15 DIAGNOSIS — F331 Major depressive disorder, recurrent, moderate: Secondary | ICD-10-CM

## 2022-11-15 DIAGNOSIS — I10 Essential (primary) hypertension: Secondary | ICD-10-CM | POA: Diagnosis not present

## 2022-11-15 DIAGNOSIS — F411 Generalized anxiety disorder: Secondary | ICD-10-CM

## 2022-11-15 MED ORDER — BREXPIPRAZOLE 1 MG PO TABS
1.0000 mg | ORAL_TABLET | Freq: Every day | ORAL | 0 refills | Status: DC
Start: 2022-11-15 — End: 2023-01-17

## 2022-11-15 MED ORDER — ESCITALOPRAM OXALATE 20 MG PO TABS
20.0000 mg | ORAL_TABLET | Freq: Every day | ORAL | 1 refills | Status: DC
Start: 2022-11-15 — End: 2023-02-10

## 2022-11-15 MED ORDER — REXULTI 0.5 MG PO TABS
0.5000 mg | ORAL_TABLET | Freq: Every day | ORAL | 0 refills | Status: DC
Start: 2022-11-15 — End: 2022-11-15

## 2022-11-15 MED ORDER — BREXPIPRAZOLE 1 MG PO TABS
1.0000 mg | ORAL_TABLET | Freq: Every day | ORAL | 0 refills | Status: DC
Start: 2022-11-15 — End: 2022-11-15

## 2022-11-15 MED ORDER — REXULTI 0.5 MG PO TABS
0.5000 mg | ORAL_TABLET | Freq: Every day | ORAL | 0 refills | Status: DC
Start: 2022-11-15 — End: 2023-01-17

## 2022-11-15 MED ORDER — BREXPIPRAZOLE 2 MG PO TABS
2.0000 mg | ORAL_TABLET | Freq: Every day | ORAL | 3 refills | Status: DC
Start: 2022-11-15 — End: 2023-01-17

## 2022-11-15 NOTE — Assessment & Plan Note (Signed)
Blood pressure goal <130/80 given history CAD.  Blood pressure slightly elevated today, though she has been under increased stress recently.  Encouraged to continue ambulatory blood pressure monitoring, low-sodium diet, and taking time for herself to continue to reduce stress.  Continue losartan 25 mg daily. If blood pressure remains elevated, will increase medication.

## 2022-11-15 NOTE — Progress Notes (Signed)
Established Patient Office Visit  Subjective   Patient ID: Regina Perry, female    DOB: 08-14-1960  Age: 62 y.o. MRN: [id number]  Chief Complaint  Patient presents with   Anxiety   Depression    HPI Regina Perry is a 62 y.o. female presenting today for follow up of HTN, mood. Hypertension: Pt denies chest pain, SOB, dizziness, edema, syncope, fatigue or heart palpitations. Taking losartan, reports excellent compliance with treatment. Denies side effects. Mood: Patient is here to follow up for anxiety and depression, currently managing with Lexapro. Taking medication without side effects, reports excellent compliance with treatment.  She feels mood is worse since last visit.  Both anxiety and depression were previously well-controlled on Lexapro 20 mg, but due to increased stress with her father being placed in hospice as well as a flare of diverticulitis contributing to significant pain, both anxiety and depression have worsened.  This has also been affecting her ability to sleep which is bothersome.     11/15/2022    2:55 PM 09/19/2022    2:00 PM 08/08/2022    8:36 AM  Depression screen PHQ 2/9  Decreased Interest 2 0 3  Down, Depressed, Hopeless 2 0 3  PHQ - 2 Score 4 0 6  Altered sleeping 2 1 1   Tired, decreased energy 2 1 3   Change in appetite 1 1 1   Feeling bad or failure about yourself  2 0 2  Trouble concentrating 1 0 1  Moving slowly or fidgety/restless 0 0 1  Suicidal thoughts 0 0 0  PHQ-9 Score 12 3 15   Difficult doing work/chores Somewhat difficult Somewhat difficult Very difficult       11/15/2022    2:56 PM 09/19/2022    2:00 PM 08/08/2022    8:37 AM 11/16/2021    2:03 PM  GAD 7 : Generalized Anxiety Score  Nervous, Anxious, on Edge 2 1 3 2   Control/stop worrying 2 1 2 2   Worry too much - different things 2 1 3 2   Trouble relaxing 1 0 2 1  Restless 1 0 1 1  Easily annoyed or irritable 2 1 3 1   Afraid - awful might happen 2 2 3 2   Total GAD 7 Score 12 6 17 11    Anxiety Difficulty Somewhat difficult Somewhat difficult Very difficult    ROS Negative unless otherwise noted in HPI   Objective:     BP (!) 141/85 (BP Location: Left Arm, Patient Position: Sitting, Cuff Size: Normal)   Pulse (!) 57   Resp 18   Ht 5' (1.524 m)   Wt 133 lb (60.3 kg)   SpO2 97%   BMI 25.97 kg/m   Physical Exam Constitutional:      General: She is not in acute distress.    Appearance: Normal appearance.  HENT:     Head: Normocephalic and atraumatic.  Pulmonary:     Effort: Pulmonary effort is normal. No respiratory distress.  Musculoskeletal:     Cervical back: Normal range of motion.  Neurological:     General: No focal deficit present.     Mental Status: She is alert and oriented to person, place, and time. Mental status is at baseline.  Psychiatric:        Mood and Affect: Mood normal.        Thought Content: Thought content normal.        Judgment: Judgment normal.      Assessment & Plan:  Primary hypertension Assessment &  Plan: Blood pressure goal <130/80 given history CAD.  Blood pressure slightly elevated today, though she has been under increased stress recently.  Encouraged to continue ambulatory blood pressure monitoring, low-sodium diet, and taking time for herself to continue to reduce stress.  Continue losartan 25 mg daily. If blood pressure remains elevated, will increase medication.   Generalized anxiety disorder Assessment & Plan: GAD-7 score worsened to 12 from 6.  Continue Lexapro 20 mg daily.  We discussed options including adding a medication like Rexulti versus buspirone.  She would prefer to add Rexulti because it can be an effective augment for both anxiety and depression.  Start Rexulti 0.5 mg for 1 week, then increase to Rexulti 1 mg for 1 week, then increase to Rexulti 2 mg as maintenance dose.  Provided savings card as well.  Orders: -     Escitalopram Oxalate; Take 1 tablet (20 mg total) by mouth at bedtime.  Dispense: 90  tablet; Refill: 1  Moderate episode of recurrent major depressive disorder (HCC) Assessment & Plan: PHQ-9 score worsened to 12 from 3. Continue Lexapro 20 mg daily.  We discussed options including adding a medication like Rexulti versus Wellbutrin.  She would prefer to add Rexulti because it can be an effective augment for both anxiety and depression.  She would also prefer a simple medication routine rather than having to add both Wellbutrin and buspirone potentially.  Start Rexulti 0.5 mg for 1 week, then increase to Rexulti 1 mg for 1 week, then increase to Rexulti 2 mg as maintenance dose.  Provided savings card as well.  Orders: -     Rexulti; Take 1 tablet (0.5 mg total) by mouth daily.  Dispense: 7 tablet; Refill: 0 -     Brexpiprazole; Take 1 tablet (1 mg total) by mouth daily.  Dispense: 7 tablet; Refill: 0 -     Brexpiprazole; Take 1 tablet (2 mg total) by mouth daily.  Dispense: 30 tablet; Refill: 3 -     Escitalopram Oxalate; Take 1 tablet (20 mg total) by mouth at bedtime.  Dispense: 90 tablet; Refill: 1    Return in about 2 months (around 01/15/2023) for follow-up for mood, in person or video.    Melida Quitter, PA

## 2022-11-15 NOTE — Assessment & Plan Note (Signed)
GAD-7 score worsened to 12 from 6.  Continue Lexapro 20 mg daily.  We discussed options including adding a medication like Rexulti versus buspirone.  She would prefer to add Rexulti because it can be an effective augment for both anxiety and depression.  Start Rexulti 0.5 mg for 1 week, then increase to Rexulti 1 mg for 1 week, then increase to Rexulti 2 mg as maintenance dose.  Provided savings card as well.

## 2022-11-15 NOTE — Patient Instructions (Signed)
START 0.5 mg for 1 week, then 1 mg for 1 week, then 2 mg as maintenance dose of Rexulti.

## 2022-11-15 NOTE — Assessment & Plan Note (Signed)
PHQ-9 score worsened to 12 from 3. Continue Lexapro 20 mg daily.  We discussed options including adding a medication like Rexulti versus Wellbutrin.  She would prefer to add Rexulti because it can be an effective augment for both anxiety and depression.  She would also prefer a simple medication routine rather than having to add both Wellbutrin and buspirone potentially.  Start Rexulti 0.5 mg for 1 week, then increase to Rexulti 1 mg for 1 week, then increase to Rexulti 2 mg as maintenance dose.  Provided savings card as well.

## 2022-11-17 ENCOUNTER — Other Ambulatory Visit: Payer: Self-pay | Admitting: Family Medicine

## 2022-11-17 DIAGNOSIS — B351 Tinea unguium: Secondary | ICD-10-CM

## 2023-01-05 ENCOUNTER — Ambulatory Visit (HOSPITAL_COMMUNITY): Payer: Medicare HMO

## 2023-01-06 ENCOUNTER — Ambulatory Visit (HOSPITAL_COMMUNITY): Payer: Medicare HMO

## 2023-01-07 ENCOUNTER — Other Ambulatory Visit: Payer: Self-pay | Admitting: Nurse Practitioner

## 2023-01-07 DIAGNOSIS — I10 Essential (primary) hypertension: Secondary | ICD-10-CM

## 2023-01-10 ENCOUNTER — Ambulatory Visit (HOSPITAL_COMMUNITY)
Admission: RE | Admit: 2023-01-10 | Discharge: 2023-01-10 | Disposition: A | Discharge status: Home / Self Care | Payer: Medicare HMO | Source: Ambulatory Visit | Attending: Cardiology | Admitting: Cardiology

## 2023-01-10 DIAGNOSIS — I714 Abdominal aortic aneurysm, without rupture, unspecified: Secondary | ICD-10-CM

## 2023-01-17 ENCOUNTER — Ambulatory Visit (INDEPENDENT_AMBULATORY_CARE_PROVIDER_SITE_OTHER): Payer: Medicare HMO | Admitting: Family Medicine

## 2023-01-17 ENCOUNTER — Encounter: Payer: Self-pay | Admitting: Family Medicine

## 2023-01-17 VITALS — BP 137/68 | HR 68 | Resp 18 | Ht 60.0 in | Wt 139.0 lb

## 2023-01-17 DIAGNOSIS — D229 Melanocytic nevi, unspecified: Secondary | ICD-10-CM

## 2023-01-17 DIAGNOSIS — F331 Major depressive disorder, recurrent, moderate: Secondary | ICD-10-CM

## 2023-01-17 DIAGNOSIS — Z23 Encounter for immunization: Secondary | ICD-10-CM | POA: Diagnosis not present

## 2023-01-17 DIAGNOSIS — F5101 Primary insomnia: Secondary | ICD-10-CM

## 2023-01-17 DIAGNOSIS — F411 Generalized anxiety disorder: Secondary | ICD-10-CM

## 2023-01-17 DIAGNOSIS — E785 Hyperlipidemia, unspecified: Secondary | ICD-10-CM

## 2023-01-17 MED ORDER — HYDROXYZINE PAMOATE 25 MG PO CAPS
25.0000 mg | ORAL_CAPSULE | Freq: Every evening | ORAL | 0 refills | Status: DC | PRN
Start: 2023-01-17 — End: 2023-04-20

## 2023-01-17 MED ORDER — EZETIMIBE 10 MG PO TABS
10.0000 mg | ORAL_TABLET | Freq: Every day | ORAL | 3 refills | Status: DC
Start: 2023-01-17 — End: 2023-06-26

## 2023-01-17 NOTE — Assessment & Plan Note (Signed)
Start hydroxyzine 25-50 mg by mouth at bedtime as needed to reduce racing thoughts and promote sleep.

## 2023-01-17 NOTE — Progress Notes (Signed)
Established Patient Office Visit  Subjective   Patient ID: Regina Perry, female    DOB: 17-Nov-1960  Age: 62 y.o. MRN: [id number]  Chief Complaint  Patient presents with   Anxiety   Depression   Hypertension    HPI Regina Perry is a 62 y.o. female presenting today for follow up of mood.  She also has several skin lesions that are concerning as they seem to be changing.  1 spot on her right back is rough and itchy and seems to be increasing in size.  She also has scattered lesions across her legs that turned gray and then returned to their baseline tan color without any identified trigger. Mood: Patient is here to follow up for depression and anxiety, currently managing with Lexapro 20 mg daily. Taking medication without side effects, reports excellent compliance with treatment.  At last appointment, tried to start Rexulti as augment for Lexapro but was denied by insurance.  Denies mood changes or SI/HI. She feels mood is improved since last visit. Denies chest pain, difficulty concentrating, dizziness, fatigue, insomnia, irritability, palpitations, panic attacks, racing thoughts, SOB, sweating. Denies anhedonia, depressed mood, difficulty concentrating, fatigue, feelings of worthlessness/guilt, hopelessness, hypersomnia, impaired memory, insomnia, psychomotor agitation, psychomotor retardation, recurrent thoughts of death, weight changes.     2023-02-06    3:36 PM 11/15/2022    2:55 PM 09/19/2022    2:00 PM  Depression screen PHQ 2/9  Decreased Interest 1 2 0  Down, Depressed, Hopeless 1 2 0  PHQ - 2 Score 2 4 0  Altered sleeping 1 2 1   Tired, decreased energy 1 2 1   Change in appetite 0 1 1  Feeling bad or failure about yourself  1 2 0  Trouble concentrating 0 1 0  Moving slowly or fidgety/restless 0 0 0  Suicidal thoughts 0 0 0  PHQ-9 Score 5 12 3   Difficult doing work/chores Not difficult at all Somewhat difficult Somewhat difficult       Feb 06, 2023    3:36 PM 11/15/2022     2:56 PM 09/19/2022    2:00 PM 08/08/2022    8:37 AM  GAD 7 : Generalized Anxiety Score  Nervous, Anxious, on Edge 1 2 1 3   Control/stop worrying 1 2 1 2   Worry too much - different things 1 2 1 3   Trouble relaxing 0 1 0 2  Restless 0 1 0 1  Easily annoyed or irritable 0 2 1 3   Afraid - awful might happen 1 2 2 3   Total GAD 7 Score 4 12 6 17   Anxiety Difficulty Not difficult at all Somewhat difficult Somewhat difficult Very difficult   Outpatient Medications Prior to Visit  Medication Sig Note   acetaminophen (TYLENOL) 500 MG tablet Take 1,000 mg by mouth every 6 (six) hours as needed for moderate pain or headache.    clopidogrel (PLAVIX) 75 MG tablet TAKE 1 TABLET EVERY DAY    escitalopram (LEXAPRO) 20 MG tablet Take 1 tablet (20 mg total) by mouth at bedtime.    gabapentin (NEURONTIN) 300 MG capsule TAKE 1 CAPSULE THREE TIMES DAILY    HYDROcodone-acetaminophen (NORCO) 10-325 MG tablet Take 1 tablet by mouth every 6 (six) hours as needed for moderate pain.    loratadine (CLARITIN) 10 MG tablet Take 1 tablet (10 mg total) by mouth daily.    LORazepam (ATIVAN) 0.5 MG tablet Take 0.5 mg by mouth every 6 (six) hours as needed for anxiety.    losartan (COZAAR) 25  MG tablet TAKE 1 TABLET EVERY EVENING    metoprolol succinate (TOPROL-XL) 25 MG 24 hr tablet TAKE 1 TABLET EVERY DAY    nitroGLYCERIN (NITROSTAT) 0.4 MG SL tablet Place 1 tablet (0.4 mg total) under the tongue every 5 (five) minutes as needed for chest pain.    pantoprazole (PROTONIX) 40 MG tablet TAKE 1 TABLET EVERY DAY    promethazine (PHENERGAN) 25 MG tablet Take 1 tablet (25 mg total) by mouth every 6 (six) hours as needed for nausea or vomiting.    rosuvastatin (CRESTOR) 40 MG tablet TAKE 1 TABLET EVERY DAY    SUMAtriptan (IMITREX) 100 MG tablet TAKE 1 TABLET (100 MG TOTAL) BY MOUTH EVERY 2 (TWO) HOURS AS NEEDED FOR MIGRAINE OR HEADACHE.    terbinafine (LAMISIL) 250 MG tablet TAKE 1 TABLET (250 MG TOTAL) BY MOUTH DAILY.     [DISCONTINUED] Brexpiprazole (REXULTI) 0.5 MG TABS Take 1 tablet (0.5 mg total) by mouth daily. (Patient not taking: Reported on 01/17/2023) 01/17/2023: Insurance issue   [DISCONTINUED] brexpiprazole (REXULTI) 1 MG TABS tablet Take 1 tablet (1 mg total) by mouth daily. (Patient not taking: Reported on 01/17/2023)    [DISCONTINUED] brexpiprazole (REXULTI) 2 MG TABS tablet Take 1 tablet (2 mg total) by mouth daily. (Patient not taking: Reported on 01/17/2023)    [DISCONTINUED] ezetimibe (ZETIA) 10 MG tablet Take 1 tablet (10 mg total) by mouth daily.    No facility-administered medications prior to visit.    ROS Negative unless otherwise noted in HPI   Objective:     BP 137/68 (BP Location: Left Arm, Patient Position: Sitting, Cuff Size: Normal)   Pulse 68   Resp 18   Ht 5' (1.524 m)   Wt 139 lb (63 kg)   SpO2 95%   BMI 27.15 kg/m   Physical Exam Constitutional:      General: She is not in acute distress.    Appearance: Normal appearance.  HENT:     Head: Normocephalic and atraumatic.  Pulmonary:     Effort: Pulmonary effort is normal. No respiratory distress.  Musculoskeletal:     Cervical back: Normal range of motion.  Neurological:     General: No focal deficit present.     Mental Status: She is alert and oriented to person, place, and time. Mental status is at baseline.  Psychiatric:        Mood and Affect: Mood normal.        Thought Content: Thought content normal.        Judgment: Judgment normal.     Assessment & Plan:  Moderate episode of recurrent major depressive disorder (HCC) Assessment & Plan: PHQ-9 score 5, much improved.  Continue Lexapro 20 mg daily.  Will continue to monitor.   Need for influenza vaccination -     Flu vaccine trivalent PF, 6mos and older(Flulaval,Afluria,Fluarix,Fluzone)  Primary insomnia Assessment & Plan: Start hydroxyzine 25-50 mg by mouth at bedtime as needed to reduce racing thoughts and promote sleep.  Orders: -      hydrOXYzine Pamoate; Take 1-2 capsules (25-50 mg total) by mouth at bedtime as needed.  Dispense: 30 capsule; Refill: 0  Generalized anxiety disorder Assessment & Plan: GAD-7 significantly improved to 4.  Continue Lexapro 20 mg daily.  Add hydroxyzine 25-50 mg nightly at bedtime for anxiety and insomnia.  Follow-up in about 2 months.  Orders: -     hydrOXYzine Pamoate; Take 1-2 capsules (25-50 mg total) by mouth at bedtime as needed.  Dispense: 30 capsule;  Refill: 0  Hyperlipidemia, unspecified hyperlipidemia type -     Ezetimibe; Take 1 tablet (10 mg total) by mouth daily.  Dispense: 90 tablet; Refill: 3  Change in multiple nevi -     Ambulatory referral to Dermatology  Provided refill of ezetimibe.  Referral to dermatology for evaluation of lesions with dermascope.  Return in about 2 months (around 03/19/2023) for follow-up for mood and sleep.    Melida Quitter, PA

## 2023-01-17 NOTE — Assessment & Plan Note (Signed)
GAD-7 significantly improved to 4.  Continue Lexapro 20 mg daily.  Add hydroxyzine 25-50 mg nightly at bedtime for anxiety and insomnia.  Follow-up in about 2 months.

## 2023-01-17 NOTE — Assessment & Plan Note (Signed)
PHQ-9 score 5, much improved.  Continue Lexapro 20 mg daily.  Will continue to monitor.

## 2023-01-25 ENCOUNTER — Other Ambulatory Visit: Payer: Self-pay | Admitting: Cardiology

## 2023-02-07 ENCOUNTER — Other Ambulatory Visit: Payer: Self-pay | Admitting: Family Medicine

## 2023-02-07 DIAGNOSIS — F331 Major depressive disorder, recurrent, moderate: Secondary | ICD-10-CM

## 2023-02-07 DIAGNOSIS — F411 Generalized anxiety disorder: Secondary | ICD-10-CM

## 2023-03-02 ENCOUNTER — Ambulatory Visit (HOSPITAL_COMMUNITY)
Admission: RE | Admit: 2023-03-02 | Payer: Medicare HMO | Source: Ambulatory Visit | Attending: Cardiology | Admitting: Cardiology

## 2023-03-02 ENCOUNTER — Encounter (HOSPITAL_COMMUNITY): Payer: Self-pay

## 2023-03-02 ENCOUNTER — Ambulatory Visit (HOSPITAL_COMMUNITY)
Admission: RE | Admit: 2023-03-02 | Discharge: 2023-03-02 | Disposition: A | Discharge status: Home / Self Care | Payer: Medicare HMO | Source: Ambulatory Visit | Attending: Internal Medicine | Admitting: Internal Medicine

## 2023-03-02 ENCOUNTER — Other Ambulatory Visit: Payer: Self-pay | Admitting: Nurse Practitioner

## 2023-03-02 DIAGNOSIS — I6523 Occlusion and stenosis of bilateral carotid arteries: Secondary | ICD-10-CM | POA: Insufficient documentation

## 2023-03-17 ENCOUNTER — Other Ambulatory Visit: Payer: Medicare HMO

## 2023-03-31 ENCOUNTER — Telehealth: Payer: Self-pay | Admitting: Cardiology

## 2023-03-31 NOTE — Telephone Encounter (Signed)
Pt returning nurses call regarding test results. Call transferred 

## 2023-03-31 NOTE — Telephone Encounter (Signed)
 Pt called in to discuss recent VM left for pt going over recent carotid duplex results. Pt verbalized understanding of results. Pt stated for the last 3 days she had been having neck pain that makes her feel like her throat is closing and she is still currently having the pain. Pt was advised to go to the ED to get evaluated for her symptoms. Pt agreed and stated she would be going to the ED shortly.

## 2023-04-03 ENCOUNTER — Ambulatory Visit: Payer: Medicare HMO | Admitting: Dermatology

## 2023-04-04 ENCOUNTER — Ambulatory Visit (INDEPENDENT_AMBULATORY_CARE_PROVIDER_SITE_OTHER): Payer: Medicare HMO | Admitting: Family Medicine

## 2023-04-04 ENCOUNTER — Encounter: Payer: Self-pay | Admitting: Family Medicine

## 2023-04-04 VITALS — BP 130/76 | HR 52 | Temp 98.0°F | Ht 60.0 in | Wt 137.0 lb

## 2023-04-04 DIAGNOSIS — M5432 Sciatica, left side: Secondary | ICD-10-CM | POA: Diagnosis not present

## 2023-04-04 DIAGNOSIS — H65191 Other acute nonsuppurative otitis media, right ear: Secondary | ICD-10-CM

## 2023-04-04 DIAGNOSIS — F411 Generalized anxiety disorder: Secondary | ICD-10-CM | POA: Diagnosis not present

## 2023-04-04 DIAGNOSIS — F331 Major depressive disorder, recurrent, moderate: Secondary | ICD-10-CM | POA: Diagnosis not present

## 2023-04-04 DIAGNOSIS — F5101 Primary insomnia: Secondary | ICD-10-CM | POA: Diagnosis not present

## 2023-04-04 DIAGNOSIS — Z122 Encounter for screening for malignant neoplasm of respiratory organs: Secondary | ICD-10-CM

## 2023-04-04 DIAGNOSIS — B9689 Other specified bacterial agents as the cause of diseases classified elsewhere: Secondary | ICD-10-CM

## 2023-04-04 DIAGNOSIS — J019 Acute sinusitis, unspecified: Secondary | ICD-10-CM

## 2023-04-04 MED ORDER — GABAPENTIN 300 MG PO CAPS: 300.0000 mg | ORAL_CAPSULE | Freq: Three times a day (TID) | ORAL | 1 refills | Status: DC

## 2023-04-04 MED ORDER — ESCITALOPRAM OXALATE 20 MG PO TABS
20.0000 mg | ORAL_TABLET | Freq: Every day | ORAL | 0 refills | Status: DC
Start: 2023-04-04 — End: 2023-06-07

## 2023-04-04 MED ORDER — AMOXICILLIN-POT CLAVULANATE 875-125 MG PO TABS
1.0000 | ORAL_TABLET | Freq: Two times a day (BID) | ORAL | 0 refills | Status: DC
Start: 2023-04-04 — End: 2023-04-20

## 2023-04-04 MED ORDER — GUAIFENESIN 200 MG PO TABS: 200.0000 mg | ORAL_TABLET | ORAL | 0 refills | Status: AC | PRN

## 2023-04-04 NOTE — Patient Instructions (Addendum)
 Our records show that you are due for a lung cancer screening.  This order has been placed, and you should be contacted to schedule the low-dose CT scan for lung cancer screening.

## 2023-04-04 NOTE — Telephone Encounter (Signed)
 Sorry Demetris, yes meant to send it to [facility].   ST

## 2023-04-04 NOTE — Assessment & Plan Note (Signed)
 Continue hydroxyzine 25-50 mg by mouth at bedtime as needed to reduce racing thoughts and promote sleep.

## 2023-04-04 NOTE — Assessment & Plan Note (Signed)
 PHQ-9 score 4, stable.  Continue Lexapro 20 mg daily.  Will continue to monitor.

## 2023-04-04 NOTE — Assessment & Plan Note (Signed)
 GAD-7 score 8.  Continue Lexapro 20 mg daily.  Continue hydroxyzine 25-50 mg nightly at bedtime for anxiety and insomnia.  Follow-up with cardiology as scheduled for concerns about heart.

## 2023-04-04 NOTE — Progress Notes (Signed)
 Established Patient Office Visit  Subjective   Patient ID: Regina Perry, female    DOB: 07-14-1960  Age: 63 y.o. MRN: [id number]  Chief Complaint  Patient presents with   Mood and Sleep follow Up   Depression   Sinus Problem    With ear pain    HPI Regina Perry is a 63 y.o. female presenting today for follow up of sleep and mood.  At last appointment, started hydroxyzine  25-50 mg p.o. at bedtime to improve sleep.  She took it for a while but since then has stopped and has not needed to take anything for sleep.  She is also still taking Lexapro  20 mg daily and her mood is doing very well.  Acutely, she complains of ear pain and sinus pressure.  This started about 3 days ago and has been gradually worsening.  She endorses rhinorrhea, sinus pressure, ear pain that is worse when laying down and first thing in the morning after getting up.     04/04/2023    2:36 PM 01/17/2023    3:36 PM 11/15/2022    2:55 PM  Depression screen PHQ 2/9  Decreased Interest 1 1 2   Down, Depressed, Hopeless 1 1 2   PHQ - 2 Score 2 2 4   Altered sleeping 0 1 2  Tired, decreased energy 1 1 2   Change in appetite 1 0 1  Feeling bad or failure about yourself  0 1 2  Trouble concentrating 0 0 1  Moving slowly or fidgety/restless 0 0 0  Suicidal thoughts 0 0 0  PHQ-9 Score 4 5 12   Difficult doing work/chores Somewhat difficult Not difficult at all Somewhat difficult       04/04/2023    2:37 PM 01/17/2023    3:36 PM 11/15/2022    2:56 PM 09/19/2022    2:00 PM  GAD 7 : Generalized Anxiety Score  Nervous, Anxious, on Edge 2 1 2 1   Control/stop worrying 1 1 2 1   Worry too much - different things 1 1 2 1   Trouble relaxing 1 0 1 0  Restless 1 0 1 0  Easily annoyed or irritable 1 0 2 1  Afraid - awful might happen 1 1 2 2   Total GAD 7 Score 8 4 12 6   Anxiety Difficulty Somewhat difficult Not difficult at all Somewhat difficult Somewhat difficult     Outpatient Medications Prior to Visit  Medication Sig    acetaminophen  (TYLENOL ) 500 MG tablet Take 1,000 mg by mouth every 6 (six) hours as needed for moderate pain or headache.   clopidogrel  (PLAVIX ) 75 MG tablet TAKE 1 TABLET EVERY DAY   estradiol (ESTRACE) 0.1 MG/GM vaginal cream Place 1 Applicatorful vaginally daily.   ezetimibe  (ZETIA ) 10 MG tablet Take 1 tablet (10 mg total) by mouth daily.   HYDROcodone -acetaminophen  (NORCO) 10-325 MG tablet Take 1 tablet by mouth every 6 (six) hours as needed for moderate pain.   hydrOXYzine  (VISTARIL ) 25 MG capsule Take 1-2 capsules (25-50 mg total) by mouth at bedtime as needed.   loratadine  (CLARITIN ) 10 MG tablet Take 1 tablet (10 mg total) by mouth daily.   LORazepam (ATIVAN) 0.5 MG tablet Take 0.5 mg by mouth every 6 (six) hours as needed for anxiety.   losartan  (COZAAR ) 25 MG tablet TAKE 1 TABLET EVERY EVENING   metoprolol  succinate (TOPROL -XL) 25 MG 24 hr tablet TAKE 1 TABLET EVERY DAY   nitroGLYCERIN  (NITROSTAT ) 0.4 MG SL tablet Place 1 tablet (0.4 mg total)  under the tongue every 5 (five) minutes as needed for chest pain.   pantoprazole  (PROTONIX ) 40 MG tablet TAKE 1 TABLET EVERY DAY   promethazine  (PHENERGAN ) 25 MG tablet Take 1 tablet (25 mg total) by mouth every 6 (six) hours as needed for nausea or vomiting.   rosuvastatin  (CRESTOR ) 40 MG tablet TAKE 1 TABLET EVERY DAY   SUMAtriptan  (IMITREX ) 100 MG tablet TAKE 1 TABLET (100 MG TOTAL) BY MOUTH EVERY 2 (TWO) HOURS AS NEEDED FOR MIGRAINE OR HEADACHE AS DIRECTED   [DISCONTINUED] escitalopram  (LEXAPRO ) 20 MG tablet TAKE 1 TABLET (20 MG TOTAL) BY MOUTH AT BEDTIME.   [DISCONTINUED] gabapentin  (NEURONTIN ) 300 MG capsule TAKE 1 CAPSULE THREE TIMES DAILY   No facility-administered medications prior to visit.    ROS Negative unless otherwise noted in HPI   Objective:     BP 130/76   Pulse (!) 52   Temp 98 F (36.7 C) (Oral)   Ht 5' (1.524 m)   Wt 137 lb 0.1 oz (62.1 kg)   SpO2 98%   BMI 26.76 kg/m   Physical Exam Constitutional:       General: She is not in acute distress.    Appearance: Normal appearance. She is not ill-appearing.  HENT:     Head: Normocephalic and atraumatic.     Right Ear: Ear canal and external ear normal. A middle ear effusion is present. Tympanic membrane is erythematous.     Left Ear: Tympanic membrane, ear canal and external ear normal.     Nose: Nose normal. No congestion or rhinorrhea.     Mouth/Throat:     Mouth: Mucous membranes are moist.     Pharynx: Oropharynx is clear. No oropharyngeal exudate or posterior oropharyngeal erythema.  Eyes:     General:        Right eye: No discharge.        Left eye: No discharge.     Conjunctiva/sclera: Conjunctivae normal.     Pupils: Pupils are equal, round, and reactive to light.  Cardiovascular:     Rate and Rhythm: Normal rate and regular rhythm.     Heart sounds: No murmur heard.    No friction rub. No gallop.  Pulmonary:     Effort: Pulmonary effort is normal. No respiratory distress.     Breath sounds: Wheezing present. No rhonchi or rales.  Skin:    General: Skin is warm and dry.  Neurological:     Mental Status: She is alert and oriented to person, place, and time.      Assessment & Plan:  Moderate episode of recurrent major depressive disorder (HCC) Assessment & Plan: PHQ-9 score 4, stable.  Continue Lexapro  20 mg daily.  Will continue to monitor.  Orders: -     Escitalopram  Oxalate; Take 1 tablet (20 mg total) by mouth at bedtime.  Dispense: 90 tablet; Refill: 0  Primary insomnia Assessment & Plan: Continue hydroxyzine  25-50 mg by mouth at bedtime as needed to reduce racing thoughts and promote sleep.   Generalized anxiety disorder Assessment & Plan: GAD-7 score 8.  Continue Lexapro  20 mg daily.  Continue hydroxyzine  25-50 mg nightly at bedtime for anxiety and insomnia.  Follow-up with cardiology as scheduled for concerns about heart.  Orders: -     Escitalopram  Oxalate; Take 1 tablet (20 mg total) by mouth at bedtime.   Dispense: 90 tablet; Refill: 0  Left sided sciatica -     Gabapentin ; Take 1 capsule (300 mg total) by mouth 3 (  three) times daily.  Dispense: 270 capsule; Refill: 1  Screening for lung cancer -     CT CHEST LUNG CANCER SCREENING LOW DOSE WO CONTRAST; Future  Other non-recurrent acute nonsuppurative otitis media of right ear -     Amoxicillin -Pot Clavulanate; Take 1 tablet by mouth 2 (two) times daily.  Dispense: 20 tablet; Refill: 0  Acute bacterial rhinosinusitis -     Amoxicillin -Pot Clavulanate; Take 1 tablet by mouth 2 (two) times daily.  Dispense: 20 tablet; Refill: 0  Other orders -     guaiFENesin ; Take 1 tablet (200 mg total) by mouth every 4 (four) hours as needed for up to 10 days for cough or to loosen phlegm.  Dispense: 30 tablet; Refill: 0  10-day course of Augmentin  twice daily for acute otitis media and acute bacterial rhinosinusitis.  Also providing guaifenesin  to take as needed for rhinorrhea, encouraged increased hydration while taking this medication.  Patient verbalized understanding and is agreeable to this plan.  Return in about 4 months (around 08/02/2023) for follow-up for sleep and mood.    Joesph DELENA Sear, PA

## 2023-04-14 ENCOUNTER — Ambulatory Visit
Admission: RE | Admit: 2023-04-14 | Discharge: 2023-04-14 | Disposition: A | Discharge status: Home / Self Care | Payer: Medicare HMO | Source: Ambulatory Visit | Attending: Family Medicine | Admitting: Family Medicine

## 2023-04-14 DIAGNOSIS — Z122 Encounter for screening for malignant neoplasm of respiratory organs: Secondary | ICD-10-CM

## 2023-04-19 NOTE — Progress Notes (Unsigned)
Cardiology Office Note:  .   Date:  04/20/2023  ID:  MARLOU CASTRELLON, DOB 1961-01-31, MRN [id number] PCP:  Melida Quitter, PA  Former Cardiology Providers: [place], APRN, FNP-C  [facility] Providers Primary cardiothoracic surgery: Dr. Cliffton Asters  Cardiologist:  Tessa Lerner, DO , [facility] (established care 04/22/2019 ) Electrophysiologist:  None  Click to update primary MD,subspecialty MD or APP then REFRESH:1}    Chief Complaint  Patient presents with   Atherosclerosis of native coronary artery of native heart w   Follow-up    History of Present Illness: .   Regina Perry is a 63 y.o. Caucasian female whose past medical history and cardiovascular risk factors includes: Hypertension, hyperlipidemia, tobacco use disorder, asymptomatic nonsustained ventricular tachycardia noted on event monitor in 2017, atherosclerosis of the native coronary arteries status post two-vessel CABG 12/2018, carotid artery atherosclerosis.   Patient being followed practice given her history of CAD and status post two-vessel bypass in 2020, abdominal aortic aneurysm, and carotid disease.  Patient states in the past she has been having pulmonary issues which includes pneumonia and URI symptoms for the last 4 weeks.  She still has some residual cough.  From a cardiovascular standpoint she denies any heart failure symptoms but does endorse left-sided precordial pain.  Patient stated the pain is brought on by excessive coughing and moving her arms.  Patient states that she likely has torn rotator cuff which needs to be addressed and is likely the contributing cause of her left-sided chest pain.  The pain is not brought on by effort related activities and does not resolve with rest.  Her overall function capacity remains relatively stable, ambulates at least 2000 steps per day.  She still continues to smoke at least 5 cigarettes/day.  Review of Systems: .   Review of Systems  Cardiovascular:  Positive for  chest pain (see HPI). Negative for claudication, irregular heartbeat, leg swelling, near-syncope, orthopnea, palpitations, paroxysmal nocturnal dyspnea and syncope.  Respiratory:  Negative for shortness of breath.   Hematologic/Lymphatic: Negative for bleeding problem.  Musculoskeletal:  Positive for joint pain (left shoulder - see HPI).    Studies Reviewed:   Coronary artery bypass grafting: Year: 01/09/2019 Surgical anatomy: 2 vessel CABG: LIMA to LCx and SVG to LAD  EKG: EKG Interpretation Date/Time:  Thursday April 20 2023 09:52:43 EST Ventricular Rate:  55 PR Interval:  154 QRS Duration:  90 QT Interval:  430 QTC Calculation: 411 R Axis:   3  Text Interpretation: Sinus bradycardia Nonspecific ST and T wave abnormality When compared with ECG of 06-Jan-2021 20:07, Nonspecific T wave abnormality no longer evident in Inferior leads Confirmed by Tessa Lerner [phone]) on 04/20/2023 10:03:10 AM  Echocardiogram: 03/01/2022: Normal LV systolic function with visual EF 60-65%. Left ventricle cavity is normal in size. Normal left ventricular wall thickness. Normal global wall motion. Normal diastolic filling pattern, normal LAP.  Mild tricuspid regurgitation. No evidence of pulmonary hypertension. Mild pulmonic regurgitation. Abdominal aorta is dilated (30mm) with atherosclerosis. Compared to 01/09/2019: Mild TR and PR are new.    Stress Testing:  Exercise nuclear stress test 08/04/2022: Myocardial perfusion is normal. Low risk study See report for additional details  Heart Catheterization: Cath 01/08/2019:  LM: Mid calcified 90% stenosis. LAD: Mid LAD focal calfied 60% stenosis after D2 LCx: Normal RCA: Ostial calcification without significant disease   LVEF normal LVEDP mildly elevated at 21 mmHg   Carotid artery duplex: 01/12/2021:  Right ICA (50-69%).  Left ICA (16-49%).  See report for additional details   03/01/2022: Right ICA (50-69%).  Left ICA (1-15%). See  report for additional details  02/2023 Right ICA 40-59% stenosis. Left ICA 40-59% stenosis, lower end of the range per report. Bilateral vertebral arteries antegrade flow. Normal subclavian flow.   Abdominal Aortic Duplex 08/31/2021: Moderate dilatation of the abdominal aorta is noted in the distal aorta. An abdominal aortic aneurysm measuring 3.3 x 3.1 x 3.1 cm is seen. There is ectasia noted in the proximal and mid abdominal aorta. The aorta is severely calcified throughout including common iliac arteries. Normal flow velocity in the aorta and bilateral CIA.  No significant change from 01/12/2021. Consider reevaluation in 2-3 years for stability.  RADIOLOGY: NA  Risk Assessment/Calculations:   NA  Labs:       Latest Ref Rng & Units 04/15/2022   12:00 AM 03/31/2021    9:17 AM 01/06/2021    8:21 PM  CBC  WBC  7.5     8.4  7.8   Hemoglobin 12.0 - 16.0 13.9     14.8  13.6   Hematocrit 36 - 46 43     43.3  41.2   Platelets 150 - 400 K/uL 127     158  175      This result is from an external source.       Latest Ref Rng & Units 07/28/2022    2:50 PM 04/15/2022   12:00 AM 03/07/2022   10:59 AM  BMP  Glucose 70 - 99 mg/dL 96     BUN 6 - 24 mg/dL 11  14       Creatinine 0.57 - 1.00 mg/dL 7.82  1.0     9.56   BUN/Creat Ratio 9 - 23 12     Sodium 134 - 144 mmol/L 142  141       Potassium 3.5 - 5.2 mmol/L 5.1  4.0       Chloride 96 - 106 mmol/L 104  108       CO2 20 - 29 mmol/L 22     Calcium 8.7 - 10.2 mg/dL 9.5  8.9          This result is from an external source.      Latest Ref Rng & Units 07/28/2022    2:50 PM 04/15/2022   12:00 AM 03/07/2022   10:59 AM  CMP  Glucose 70 - 99 mg/dL 96     BUN 6 - 24 mg/dL 11  14       Creatinine 0.57 - 1.00 mg/dL 2.13  1.0     0.86   Sodium 134 - 144 mmol/L 142  141       Potassium 3.5 - 5.2 mmol/L 5.1  4.0       Chloride 96 - 106 mmol/L 104  108       CO2 20 - 29 mmol/L 22     Calcium 8.7 - 10.2 mg/dL 9.5  8.9       Total Protein  6.0 - 8.5 g/dL 6.5     Total Bilirubin 0.0 - 1.2 mg/dL 0.5     Alkaline Phos 44 - 121 IU/L 73  56       AST 0 - 40 IU/L 19  15       ALT 0 - 32 IU/L 12  11          This result is from an external source.    Lab Results  Component Value  Date   CHOL 148 07/28/2022   HDL 60 07/28/2022   LDLCALC 69 07/28/2022   LDLDIRECT 67 07/28/2022   TRIG 106 07/28/2022   CHOLHDL 2.6 03/31/2021   No results for input(s): "LIPOA" in the last 8760 hours. No components found for: "NTPROBNP" No results for input(s): "PROBNP" in the last 8760 hours. No results for input(s): "TSH" in the last 8760 hours.  External Labs: Collected: October 26, 2022 provided by PCP. Hemoglobin A1c 6.2%. Total cholesterol 123, triglycerides 82, HDL 52, LDL 55. Sodium 142, potassium 4.2, chloride 106, bicarb 23.1 BUN 13, creatinine 1 AST ALT within normal limits. eGFR 70 Hemoglobin 13.2 g/dL  Physical Exam:    Today's Vitals   04/20/23 0947  BP: 124/82  Pulse: (!) 54  Resp: 16  SpO2: 94%  Weight: 135 lb (61.2 kg)  Height: 5' (1.524 m)   Body mass index is 26.37 kg/m. Wt Readings from Last 3 Encounters:  04/20/23 135 lb (61.2 kg)  04/04/23 137 lb 0.1 oz (62.1 kg)  01/17/23 139 lb (63 kg)    Physical Exam  Constitutional: No distress.  Age appropriate, hemodynamically stable.   Neck: No JVD present.  Cardiovascular: Normal rate, regular rhythm, S1 normal, S2 normal, intact distal pulses and normal pulses. Exam reveals no gallop, no S3 and no S4.  No murmur heard. Pulmonary/Chest: Effort normal and breath sounds normal. No stridor. She has no wheezes. She has no rales.  Abdominal: Soft. Bowel sounds are normal. She exhibits no distension. There is no abdominal tenderness.  Musculoskeletal:        General: No edema.     Cervical back: Neck supple.  Neurological: She is alert and oriented to person, place, and time. She has intact cranial nerves (2-12).  Skin: Skin is warm and moist.     Impression &  Recommendation(s):  Impression:   ICD-10-CM   1. Atherosclerosis of native coronary artery of native heart without angina pectoris  I25.10 EKG 12-Lead    2. S/P CABG x 2  Z95.1     3. Abdominal aortic aneurysm (AAA) without rupture, unspecified part (HCC)  I71.40 PCV AORTA DUPLEX    4. Asymptomatic bilateral carotid artery stenosis  I65.23 VAS US CAROTID    5. Mixed hyperlipidemia  E78.2     6. Essential hypertension  I10     7. Tobacco use disorder  F17.200     8. Tobacco dependence  F17.200        Recommendation(s):  Atherosclerosis of native coronary artery of native heart without angina pectoris S/P CABG x 2 Her precordial pain is noncardiac based on symptoms. In the recent past she did undergo exercise nuclear stress test during which she achieved 10.16 METS and myocardial perfusion was reported to be normal. She is educated on the importance of improving her modifiable cardiovascular risk factors: Office blood pressures are well-controlled on current medical therapy. Lipids as of July 2024 were also well-controlled. Reemphasized the importance of complete smoking cessation.  Abdominal aortic aneurysm (AAA) without rupture, unspecified part (HCC) Patient was recommended to have a repeat aorta duplex in 2 to 3 years. Given the fact that she continues to smoke would like to repeat this study in 2 years.  Will schedule of ultrasound of the aorta in 6 months prior to the next office visit Continue antiplatelet therapy and lipid-lowering agents. Reemphasized importance of complete smoking cessation  Asymptomatic bilateral carotid artery stenosis Recent duplex in December 2024 notes mild improvement in right ICA  and progression in the left ICA disease burden Lipids as of July 2024 are well-controlled. Continue lipid-lowering agents. Continue antiplatelet therapy. Reemphasized importance of complete smoking cessation Due to the change/progression of disease will recheck  carotid duplex in 6 months.  Mixed hyperlipidemia Currently on Crestor 40 mg p.o. daily, Zetia 10 mg p.o. daily.   She denies myalgia or other side effects. Cardiology is following peripherally.  Essential hypertension Office blood pressures are well-controlled. Continue current medical therapy  Tobacco use disorder Tobacco cessation counseling: Currently smoking 5 cigarettes/day Patient denies denies claudication. AAA screening noted infrarenal abdominal aortic aneurysm-patient being arranged for 2-year follow-up study prior to next visit Patient is informed to follow-up with PCP and consider lung cancer screening if and when appropriate. Consider screening for peripheral artery disease with ankle-brachial index-defer to PCP.  She is informed of the dangers of tobacco abuse including stroke, cancer, and MI, as well as benefits of tobacco cessation. She is willing to quit at this time. 7 mins were spent counseling patient cessation techniques. We discussed various methods to help quit smoking, including deciding on a date to quit, joining a support group, pharmacological agents- nicotine gum/patch/lozenges.  I will reassess her progress at the next follow-up visit   Orders Placed:  Orders Placed This Encounter  Procedures   EKG 12-Lead   Final Medication List:   No orders of the defined types were placed in this encounter.   Medications Discontinued During This Encounter  Medication Reason   amoxicillin-clavulanate (AUGMENTIN) 875-125 MG tablet Completed Course   hydrOXYzine (VISTARIL) 25 MG capsule Patient Preference     Current Outpatient Medications:    acetaminophen (TYLENOL) 500 MG tablet, Take 1,000 mg by mouth every 6 (six) hours as needed for moderate pain or headache., Disp: , Rfl:    clopidogrel (PLAVIX) 75 MG tablet, TAKE 1 TABLET EVERY DAY, Disp: 90 tablet, Rfl: 2   escitalopram (LEXAPRO) 20 MG tablet, Take 1 tablet (20 mg total) by mouth at bedtime., Disp: 90  tablet, Rfl: 0   estradiol (ESTRACE) 0.1 MG/GM vaginal cream, Place 1 Applicatorful vaginally daily., Disp: , Rfl:    gabapentin (NEURONTIN) 300 MG capsule, Take 1 capsule (300 mg total) by mouth 3 (three) times daily., Disp: 270 capsule, Rfl: 1   HYDROcodone-acetaminophen (NORCO) 10-325 MG tablet, Take 1 tablet by mouth every 6 (six) hours as needed for moderate pain., Disp: , Rfl:    loratadine (CLARITIN) 10 MG tablet, Take 1 tablet (10 mg total) by mouth daily., Disp: 90 tablet, Rfl: 3   LORazepam (ATIVAN) 0.5 MG tablet, Take 0.5 mg by mouth every 6 (six) hours as needed for anxiety., Disp: , Rfl:    losartan (COZAAR) 25 MG tablet, TAKE 1 TABLET EVERY EVENING, Disp: 90 tablet, Rfl: 3   metoprolol succinate (TOPROL-XL) 25 MG 24 hr tablet, TAKE 1 TABLET EVERY DAY, Disp: 90 tablet, Rfl: 3   nitroGLYCERIN (NITROSTAT) 0.4 MG SL tablet, Place 1 tablet (0.4 mg total) under the tongue every 5 (five) minutes as needed for chest pain., Disp: 30 tablet, Rfl: 6   pantoprazole (PROTONIX) 40 MG tablet, TAKE 1 TABLET EVERY DAY, Disp: 90 tablet, Rfl: 3   promethazine (PHENERGAN) 25 MG tablet, Take 1 tablet (25 mg total) by mouth every 6 (six) hours as needed for nausea or vomiting., Disp: 90 tablet, Rfl: 0   rosuvastatin (CRESTOR) 40 MG tablet, TAKE 1 TABLET EVERY DAY, Disp: 90 tablet, Rfl: 3   SUMAtriptan (IMITREX) 100 MG tablet, TAKE 1  TABLET (100 MG TOTAL) BY MOUTH EVERY 2 (TWO) HOURS AS NEEDED FOR MIGRAINE OR HEADACHE AS DIRECTED, Disp: 27 tablet, Rfl: 3   ezetimibe (ZETIA) 10 MG tablet, Take 1 tablet (10 mg total) by mouth daily., Disp: 90 tablet, Rfl: 3  Consent:   NA  Disposition:   6 months after abdominal aortic duplex as well as  Her questions and concerns were addressed to her satisfaction. She voices understanding of the recommendations provided during this encounter.    Signed, Tessa Lerner, DO, [facility]   [facility]  [address] [place], [place] [address] 04/20/2023 10:29  AM

## 2023-04-20 ENCOUNTER — Ambulatory Visit: Payer: Self-pay | Admitting: Cardiology

## 2023-04-20 ENCOUNTER — Ambulatory Visit: Payer: Medicare HMO | Attending: Cardiology | Admitting: Cardiology

## 2023-04-20 ENCOUNTER — Other Ambulatory Visit: Payer: Self-pay

## 2023-04-20 ENCOUNTER — Encounter: Payer: Self-pay | Admitting: Cardiology

## 2023-04-20 VITALS — BP 124/82 | HR 54 | Resp 16 | Ht 60.0 in | Wt 135.0 lb

## 2023-04-20 DIAGNOSIS — I714 Abdominal aortic aneurysm, without rupture, unspecified: Secondary | ICD-10-CM

## 2023-04-20 DIAGNOSIS — Z951 Presence of aortocoronary bypass graft: Secondary | ICD-10-CM | POA: Diagnosis not present

## 2023-04-20 DIAGNOSIS — F17200 Nicotine dependence, unspecified, uncomplicated: Secondary | ICD-10-CM | POA: Diagnosis not present

## 2023-04-20 DIAGNOSIS — E782 Mixed hyperlipidemia: Secondary | ICD-10-CM

## 2023-04-20 DIAGNOSIS — I6523 Occlusion and stenosis of bilateral carotid arteries: Secondary | ICD-10-CM | POA: Diagnosis not present

## 2023-04-20 DIAGNOSIS — I2510 Atherosclerotic heart disease of native coronary artery without angina pectoris: Secondary | ICD-10-CM | POA: Diagnosis not present

## 2023-04-20 DIAGNOSIS — I10 Essential (primary) hypertension: Secondary | ICD-10-CM

## 2023-04-20 NOTE — Patient Instructions (Addendum)
Medication Instructions:  Your physician recommends that you continue on your current medications as directed. Please refer to the Current Medication list given to you today.  *If you need a refill on your cardiac medications before your next appointment, please call your pharmacy*  Lab Work: None ordered today. If you have labs (blood work) drawn today and your tests are completely normal, you will receive your results only by: MyChart Message (if you have MyChart) OR A paper copy in the mail If you have any lab test that is abnormal or we need to change your treatment, we will call you to review the results.  Testing/Procedures: Your physician has requested that you have a carotid duplex to be completed in 6 months (July). This test is an ultrasound of the carotid arteries in your neck. It looks at blood flow through these arteries that supply the brain with blood. Allow one hour for this exam. There are no restrictions or special instructions.   Your physician has requested that you have an abdominal aorta duplex to be completed in 6 months (July). During this test, an ultrasound is used to evaluate the aorta. Allow 30 minutes for this exam. Do not eat after midnight the day before and avoid carbonated beverages.  Please note: We ask at that you not bring children with you during ultrasound (echo/ vascular) testing. Due to room size and safety concerns, children are not allowed in the ultrasound rooms during exams. Our front office staff cannot provide observation of children in our lobby area while testing is being conducted. An adult accompanying a patient to their appointment will only be allowed in the ultrasound room at the discretion of the ultrasound technician under special circumstances. We apologize for any inconvenience.   Follow-Up: At [facility], you and your health needs are our priority.  As part of our continuing mission to provide you with exceptional heart care, we have  created designated Provider Care Teams.  These Care Teams include your primary Cardiologist (physician) and Advanced Practice Providers (APPs -  Physician Assistants and Nurse Practitioners) who all work together to provide you with the care you need, when you need it.   Your next appointment:   7 month(s) (August)  The format for your next appointment:   In Person  Provider:   Tessa Lerner, DO {

## 2023-04-24 ENCOUNTER — Encounter: Payer: Self-pay | Admitting: Family Medicine

## 2023-05-04 ENCOUNTER — Encounter: Payer: Self-pay | Admitting: Family Medicine

## 2023-05-15 ENCOUNTER — Other Ambulatory Visit: Payer: Self-pay | Admitting: Cardiology

## 2023-05-15 DIAGNOSIS — Z951 Presence of aortocoronary bypass graft: Secondary | ICD-10-CM

## 2023-05-15 DIAGNOSIS — I2510 Atherosclerotic heart disease of native coronary artery without angina pectoris: Secondary | ICD-10-CM

## 2023-06-07 ENCOUNTER — Other Ambulatory Visit: Payer: Self-pay | Admitting: Family Medicine

## 2023-06-07 DIAGNOSIS — F331 Major depressive disorder, recurrent, moderate: Secondary | ICD-10-CM

## 2023-06-07 DIAGNOSIS — F411 Generalized anxiety disorder: Secondary | ICD-10-CM

## 2023-06-09 ENCOUNTER — Other Ambulatory Visit (HOSPITAL_COMMUNITY): Payer: Self-pay

## 2023-06-09 MED ORDER — ESCITALOPRAM OXALATE 20 MG PO TABS
20.0000 mg | ORAL_TABLET | Freq: Every day | ORAL | 0 refills | Status: DC
  Filled 2023-06-09 – 2023-07-24 (×4): qty 90, 90d supply, fill #0

## 2023-06-26 ENCOUNTER — Other Ambulatory Visit: Payer: Self-pay | Admitting: Family Medicine

## 2023-06-26 DIAGNOSIS — E785 Hyperlipidemia, unspecified: Secondary | ICD-10-CM

## 2023-06-27 ENCOUNTER — Other Ambulatory Visit: Payer: Self-pay

## 2023-06-27 MED ORDER — EZETIMIBE 10 MG PO TABS
10.0000 mg | ORAL_TABLET | Freq: Every day | ORAL | 3 refills | Status: DC
  Filled 2023-06-27: qty 90, 90d supply, fill #0
  Filled 2023-07-19 – 2023-09-20 (×2): qty 90, 90d supply, fill #1
  Filled 2023-12-13: qty 90, 90d supply, fill #2
  Filled 2024-01-11: qty 90, 90d supply, fill #3

## 2023-07-19 ENCOUNTER — Other Ambulatory Visit (HOSPITAL_COMMUNITY): Payer: Self-pay

## 2023-07-24 ENCOUNTER — Other Ambulatory Visit: Payer: Self-pay

## 2023-07-24 ENCOUNTER — Other Ambulatory Visit (HOSPITAL_COMMUNITY): Payer: Self-pay

## 2023-08-02 ENCOUNTER — Ambulatory Visit: Payer: Medicare HMO | Admitting: Family Medicine

## 2023-08-19 ENCOUNTER — Other Ambulatory Visit: Payer: Self-pay | Admitting: Cardiology

## 2023-08-19 DIAGNOSIS — I2510 Atherosclerotic heart disease of native coronary artery without angina pectoris: Secondary | ICD-10-CM

## 2023-08-19 DIAGNOSIS — E782 Mixed hyperlipidemia: Secondary | ICD-10-CM

## 2023-08-19 DIAGNOSIS — I6523 Occlusion and stenosis of bilateral carotid arteries: Secondary | ICD-10-CM

## 2023-09-02 ENCOUNTER — Other Ambulatory Visit: Payer: Self-pay | Admitting: Cardiology

## 2023-09-20 ENCOUNTER — Other Ambulatory Visit: Payer: Self-pay | Admitting: Family Medicine

## 2023-09-20 ENCOUNTER — Other Ambulatory Visit: Payer: Self-pay

## 2023-09-20 ENCOUNTER — Other Ambulatory Visit (HOSPITAL_COMMUNITY): Payer: Self-pay

## 2023-09-20 DIAGNOSIS — F411 Generalized anxiety disorder: Secondary | ICD-10-CM

## 2023-09-20 DIAGNOSIS — F331 Major depressive disorder, recurrent, moderate: Secondary | ICD-10-CM

## 2023-09-20 MED ORDER — ESCITALOPRAM OXALATE 20 MG PO TABS
20.0000 mg | ORAL_TABLET | Freq: Every day | ORAL | 0 refills | Status: DC
  Filled 2023-09-20: qty 90, 90d supply, fill #0

## 2023-09-28 ENCOUNTER — Other Ambulatory Visit (HOSPITAL_COMMUNITY): Payer: Self-pay

## 2023-09-28 ENCOUNTER — Ambulatory Visit (INDEPENDENT_AMBULATORY_CARE_PROVIDER_SITE_OTHER)

## 2023-09-28 VITALS — BP 123/57 | HR 57 | Temp 97.5°F | Ht 60.0 in | Wt 136.0 lb

## 2023-09-28 DIAGNOSIS — M51372 Other intervertebral disc degeneration, lumbosacral region with discogenic back pain and lower extremity pain: Secondary | ICD-10-CM

## 2023-09-28 DIAGNOSIS — F411 Generalized anxiety disorder: Secondary | ICD-10-CM

## 2023-09-28 DIAGNOSIS — B351 Tinea unguium: Secondary | ICD-10-CM | POA: Diagnosis not present

## 2023-09-28 DIAGNOSIS — F331 Major depressive disorder, recurrent, moderate: Secondary | ICD-10-CM | POA: Diagnosis not present

## 2023-09-28 DIAGNOSIS — F5101 Primary insomnia: Secondary | ICD-10-CM

## 2023-09-28 MED ORDER — ESCITALOPRAM OXALATE 5 MG PO TABS
2.5000 mg | ORAL_TABLET | Freq: Every day | ORAL | 1 refills | Status: DC
  Filled 2023-09-28 – 2023-10-06 (×3): qty 45, 90d supply, fill #0
  Filled 2023-10-17 – 2023-10-25 (×2): qty 45, 90d supply, fill #1

## 2023-09-28 MED ORDER — HYDROXYZINE PAMOATE 25 MG PO CAPS
25.0000 mg | ORAL_CAPSULE | Freq: Three times a day (TID) | ORAL | 0 refills | Status: DC | PRN
  Filled 2023-09-28 – 2023-10-02 (×2): qty 90, 30d supply, fill #0

## 2023-09-28 MED ORDER — ESCITALOPRAM OXALATE 20 MG PO TABS
20.0000 mg | ORAL_TABLET | Freq: Every day | ORAL | 0 refills | Status: DC
  Filled 2023-09-28 – 2023-10-17 (×3): qty 90, 90d supply, fill #0

## 2023-09-28 MED ORDER — TERBINAFINE HCL 250 MG PO TABS
250.0000 mg | ORAL_TABLET | Freq: Every day | ORAL | 0 refills | Status: DC
  Filled 2023-09-28 – 2023-10-06 (×3): qty 90, 90d supply, fill #0

## 2023-09-28 NOTE — Assessment & Plan Note (Signed)
 Start terbinafine  250 mg daily for 6 weeks.  Will check CMP for upcoming appointment in 6 weeks and assess liver function.

## 2023-09-28 NOTE — Patient Instructions (Addendum)
 It was nice to see you today!  As we discussed in clinic:  -Continue your Lexapro  20 mg daily.  I have also sent in a course of Lexapro  2.5 mg for you to take in addition to this.  Please keep in mind that this is half a tablet of the 5 mg pill that you will receive at the pharmacy. - Continue your hydroxyzine  at the lower dose of 25 mg nightly for sleep. -I have also sent in terbinafine  250 mg daily for 6 weeks for your toenail fungus.  We should check on your liver function in 6 weeks while you are on this medication.  I will plan to see you in around 6 weeks for your physical!  If you have any problems before your next visit feel free to message me via MyChart (minor issues or questions) or call the office, otherwise you may reach out to schedule an office visit.  Thank you! Saddie Sacks, PA-C

## 2023-09-28 NOTE — Assessment & Plan Note (Signed)
 Patient follows with pain management and is currently being prescribed Norco through them.  Patient is requesting a referral to a different pain management clinic as she is not pleased with the treatment she receives at different clinic.  Referral placed to pain management at [facility].  Appreciate their recommendations for care.

## 2023-09-28 NOTE — Assessment & Plan Note (Signed)
 Decrease hydroxyzine  dose from 50 to 25 mg nightly for insomnia in hopes that the lower dose will stop causing a.m. headaches.  If still experiencing headaches with the lower dose, we will discuss other options for sleep at her upcoming appointment.

## 2023-09-28 NOTE — Assessment & Plan Note (Signed)
 GAD-7 score 5.  Improved from last time.  Due to increased stressors revolving around the health of her father, discussed temporarily adding an extra 2.5 mg dose of Lexapro  to her 20 mg dose for a total of 22.5 mg daily.  Continue with hydroxyzine  25 mg nightly at bedtime for anxiety and insomnia.  If the lower dose of hydroxyzine  (25 mg) is still causing a.m. headaches, will try other options to treat insomnia.

## 2023-09-28 NOTE — Progress Notes (Signed)
 Established Patient Office Visit  Subjective   Patient ID: Regina Perry, female    DOB: 21-Apr-1960  Age: 63 y.o. MRN: [id number]  Chief Complaint  Patient presents with   Medical Management of Chronic Issues    HPI  Regina Perry is a 63 y.o. y/o female who presents to the clinic today for follow up on sleep, mood.   Patient currently taking Lexapro  20 mg daily for depression and anxiety. Also taking hydroxyzine  25-50 mg nightly as needed for sleep. Patient reports that she is taking these medications as prescribed. Denies side effects. Reports that her dad is currently suffering from terminal cancer and this has made her feel more depressed/anxious over the last several weeks. Also reports that the 50 mg hydroxyzine  causes AM headaches and would like to try a lower dose to see if it helps with sleep.   Patient also sees [facility] for pain management of her DDD. She reports that she would either like primary care to take over those prescriptions or see a different pain management clinic as she is not pleased with the way she is treated at the current clinic.     ROS Per HPI.    Objective:     BP (!) 123/57   Pulse (!) 57   Temp (!) 97.5 F (36.4 C) (Oral)   Ht 5' (1.524 m)   Wt 136 lb 0.6 oz (61.7 kg)   SpO2 98%   BMI 26.57 kg/m    Physical Exam Constitutional:      General: She is not in acute distress.    Appearance: Normal appearance.  Cardiovascular:     Rate and Rhythm: Normal rate and regular rhythm.     Heart sounds: Normal heart sounds. No murmur heard.    No friction rub. No gallop.  Pulmonary:     Effort: Pulmonary effort is normal. No respiratory distress.     Breath sounds: Wheezing present.  Musculoskeletal:        General: No swelling.  Skin:    General: Skin is warm and dry.  Neurological:     General: No focal deficit present.     Mental Status: She is alert.  Psychiatric:        Mood and Affect: Mood normal.        Behavior: Behavior  normal.        Thought Content: Thought content normal.    No results found for any visits on 09/28/23.    The 10-year ASCVD risk score (Arnett DK, et al., 2019) is: 7%    Assessment & Plan:   Degeneration of intervertebral disc of lumbosacral region with discogenic back pain and lower extremity pain Assessment & Plan: Patient follows with pain management and is currently being prescribed Norco through them.  Patient is requesting a referral to a different pain management clinic as she is not pleased with the treatment she receives at different clinic.  Referral placed to pain management at [facility].  Appreciate their recommendations for care.  Orders: -     Ambulatory referral to Pain Clinic  Moderate episode of recurrent major depressive disorder (HCC) -     Escitalopram  Oxalate; Take 0.5 tablets (2.5 mg total) by mouth daily.  Dispense: 45 tablet; Refill: 1 -     Escitalopram  Oxalate; Take 1 tablet (20 mg total) by mouth at bedtime.  Dispense: 90 tablet; Refill: 0  Generalized anxiety disorder Assessment & Plan: GAD-7 score 5.  Improved from last time.  Due to increased stressors revolving around the health of her father, discussed temporarily adding an extra 2.5 mg dose of Lexapro  to her 20 mg dose for a total of 22.5 mg daily.  Continue with hydroxyzine  25 mg nightly at bedtime for anxiety and insomnia.  If the lower dose of hydroxyzine  (25 mg) is still causing a.m. headaches, will try other options to treat insomnia.  Orders: -     hydrOXYzine  Pamoate; Take 1 capsule (25 mg total) by mouth every 8 (eight) hours as needed.  Dispense: 90 capsule; Refill: 0 -     Escitalopram  Oxalate; Take 0.5 tablets (2.5 mg total) by mouth daily.  Dispense: 45 tablet; Refill: 1 -     Escitalopram  Oxalate; Take 1 tablet (20 mg total) by mouth at bedtime.  Dispense: 90 tablet; Refill: 0  Onychomycosis of left great toe Assessment & Plan: Start terbinafine  250 mg daily for 6 weeks.  Will check CMP  for upcoming appointment in 6 weeks and assess liver function.  Orders: -     Terbinafine  HCl; Take 1 tablet (250 mg total) by mouth daily.  Dispense: 90 tablet; Refill: 0  Primary insomnia Assessment & Plan: Decrease hydroxyzine  dose from 50 to 25 mg nightly for insomnia in hopes that the lower dose will stop causing a.m. headaches.  If still experiencing headaches with the lower dose, we will discuss other options for sleep at her upcoming appointment.     Return in about 6 weeks (around 11/09/2023) for Physical.    Regina JULIANNA Sacks, PA-C

## 2023-09-30 ENCOUNTER — Other Ambulatory Visit (HOSPITAL_COMMUNITY): Payer: Self-pay

## 2023-10-02 ENCOUNTER — Other Ambulatory Visit: Payer: Self-pay

## 2023-10-02 ENCOUNTER — Other Ambulatory Visit (HOSPITAL_COMMUNITY): Payer: Self-pay

## 2023-10-03 ENCOUNTER — Other Ambulatory Visit: Payer: Self-pay

## 2023-10-06 ENCOUNTER — Ambulatory Visit (HOSPITAL_COMMUNITY)
Admission: RE | Admit: 2023-10-06 | Discharge: 2023-10-06 | Disposition: A | Discharge status: Home / Self Care | Payer: Medicare HMO | Source: Ambulatory Visit | Attending: Cardiology | Admitting: Cardiology

## 2023-10-06 ENCOUNTER — Other Ambulatory Visit: Payer: Self-pay

## 2023-10-06 ENCOUNTER — Other Ambulatory Visit (HOSPITAL_COMMUNITY): Payer: Self-pay

## 2023-10-06 ENCOUNTER — Ambulatory Visit (HOSPITAL_BASED_OUTPATIENT_CLINIC_OR_DEPARTMENT_OTHER)
Admission: RE | Admit: 2023-10-06 | Discharge: 2023-10-06 | Disposition: A | Discharge status: Home / Self Care | Payer: Medicare HMO | Source: Ambulatory Visit | Attending: Cardiology

## 2023-10-06 DIAGNOSIS — I6523 Occlusion and stenosis of bilateral carotid arteries: Secondary | ICD-10-CM

## 2023-10-06 DIAGNOSIS — I714 Abdominal aortic aneurysm, without rupture, unspecified: Secondary | ICD-10-CM | POA: Diagnosis present

## 2023-10-08 ENCOUNTER — Ambulatory Visit: Payer: Self-pay | Admitting: Cardiology

## 2023-10-08 DIAGNOSIS — F17200 Nicotine dependence, unspecified, uncomplicated: Secondary | ICD-10-CM

## 2023-10-08 DIAGNOSIS — I714 Abdominal aortic aneurysm, without rupture, unspecified: Secondary | ICD-10-CM

## 2023-10-08 DIAGNOSIS — I6523 Occlusion and stenosis of bilateral carotid arteries: Secondary | ICD-10-CM

## 2023-10-17 ENCOUNTER — Other Ambulatory Visit (HOSPITAL_COMMUNITY): Payer: Self-pay

## 2023-10-18 ENCOUNTER — Other Ambulatory Visit: Payer: Self-pay

## 2023-10-25 ENCOUNTER — Other Ambulatory Visit (HOSPITAL_COMMUNITY): Payer: Self-pay

## 2023-10-30 ENCOUNTER — Other Ambulatory Visit: Payer: Self-pay

## 2023-10-30 DIAGNOSIS — Z130 Encounter for screening for diseases of the blood and blood-forming organs and certain disorders involving the immune mechanism: Secondary | ICD-10-CM

## 2023-10-30 DIAGNOSIS — Z1159 Encounter for screening for other viral diseases: Secondary | ICD-10-CM

## 2023-11-01 ENCOUNTER — Other Ambulatory Visit: Payer: Self-pay | Admitting: Urology

## 2023-11-01 ENCOUNTER — Ambulatory Visit: Admitting: Urology

## 2023-11-01 VITALS — BP 116/72 | HR 59

## 2023-11-01 DIAGNOSIS — Z87442 Personal history of urinary calculi: Secondary | ICD-10-CM

## 2023-11-01 DIAGNOSIS — R310 Gross hematuria: Secondary | ICD-10-CM

## 2023-11-01 DIAGNOSIS — N200 Calculus of kidney: Secondary | ICD-10-CM

## 2023-11-01 LAB — URINALYSIS, COMPLETE
Bilirubin, UA: NEGATIVE
Bilirubin, UA: NEGATIVE
Glucose, UA: NEGATIVE
Glucose, UA: NEGATIVE
Ketones, UA: NEGATIVE
Ketones, UA: NEGATIVE
Leukocytes,UA: NEGATIVE
Nitrite, UA: NEGATIVE
Nitrite, UA: NEGATIVE
Protein,UA: NEGATIVE
Specific Gravity, UA: 1.015 (ref 1.005–1.030)
Specific Gravity, UA: <1.005 — ABNORMAL LOW (ref 1.005–1.030)
Urobilinogen, Ur: 1 mg/dL (ref 0.2–1.0)
Urobilinogen, Ur: 0.2 mg/dL (ref 0.2–1.0)
pH, UA: 6 (ref 5.0–7.5)
pH, UA: 6 (ref 5.0–7.5)

## 2023-11-01 LAB — MICROSCOPIC EXAMINATION
Bacteria, UA: NONE SEEN
Epithelial Cells (non renal): >10 /HPF — AB (ref 0–10)

## 2023-11-01 NOTE — Progress Notes (Signed)
   11/01/23  CC:  Chief Complaint  Patient presents with   Cysto    HPI: Refer to Dr. Bjorn previous note 11/08/2022.  Since her last visit continues with intermittent gross hematuria.  UA today >10 epis, 6-10 WBC, 11-30 RBC  Blood pressure 116/72, pulse (!) 59. NED. A&Ox3.   No respiratory distress   Abd soft, NT, ND Normal external genitalia with patent urethral meatus  Cystoscopy Procedure Note  Patient identification was confirmed, informed consent was obtained, and patient was prepped using Betadine solution.  Lidocaine  jelly was administered per urethral meatus.    Procedure: - Flexible cystoscope introduced, without any difficulty.   - Thorough search of the bladder revealed:    normal urethral meatus    normal urothelium    no stones    no ulcers     no tumors    no urethral polyps    no trabeculation  - Ureteral orifices were normal in position and appearance.  Post-Procedure: - Patient tolerated the procedure well  Assessment/ Plan: No bladder mucosal abnormalities on cystoscopy Catheterized urine was obtained along with insertion of the cystoscope based on significant epis. Catheterized urine showed no abnormal WBC/RBC Dr. Penne had recommended a follow-up renal ultrasound August 2025 which was ordered     Regina JAYSON Barba, MD

## 2023-11-03 ENCOUNTER — Other Ambulatory Visit

## 2023-11-03 ENCOUNTER — Other Ambulatory Visit: Payer: Self-pay | Admitting: Family Medicine

## 2023-11-03 DIAGNOSIS — I10 Essential (primary) hypertension: Secondary | ICD-10-CM

## 2023-11-03 DIAGNOSIS — Z130 Encounter for screening for diseases of the blood and blood-forming organs and certain disorders involving the immune mechanism: Secondary | ICD-10-CM

## 2023-11-03 DIAGNOSIS — Z1159 Encounter for screening for other viral diseases: Secondary | ICD-10-CM

## 2023-11-04 LAB — COMPREHENSIVE METABOLIC PANEL WITH GFR
ALT: 20 IU/L (ref 0–32)
AST: 22 IU/L (ref 0–40)
Albumin: 4.3 g/dL (ref 3.9–4.9)
Alkaline Phosphatase: 72 IU/L (ref 44–121)
BUN/Creatinine Ratio: 13 (ref 12–28)
BUN: 13 mg/dL (ref 8–27)
Bilirubin Total: 0.6 mg/dL (ref 0.0–1.2)
CO2: 20 mmol/L (ref 20–29)
Calcium: 9.2 mg/dL (ref 8.7–10.3)
Chloride: 106 mmol/L (ref 96–106)
Creatinine, Ser: 0.97 mg/dL (ref 0.57–1.00)
Globulin, Total: 2.1 g/dL (ref 1.5–4.5)
Glucose: 96 mg/dL (ref 70–99)
Potassium: 4.6 mmol/L (ref 3.5–5.2)
Sodium: 142 mmol/L (ref 134–144)
Total Protein: 6.4 g/dL (ref 6.0–8.5)
eGFR: 66 mL/min/1.73 (ref >59)

## 2023-11-04 LAB — CBC WITH DIFFERENTIAL/PLATELET
Basophils Absolute: 0.1 x10E3/uL (ref 0.0–0.2)
Basos: 1 %
EOS (ABSOLUTE): 0.2 x10E3/uL (ref 0.0–0.4)
Eos: 2 %
Hematocrit: 47.4 % — ABNORMAL HIGH (ref 34.0–46.6)
Hemoglobin: 15 g/dL (ref 11.1–15.9)
Immature Grans (Abs): 0 x10E3/uL (ref 0.0–0.1)
Immature Granulocytes: 0 %
Lymphocytes Absolute: 2.1 x10E3/uL (ref 0.7–3.1)
Lymphs: 26 %
MCH: 30 pg (ref 26.6–33.0)
MCHC: 31.6 g/dL (ref 31.5–35.7)
MCV: 95 fL (ref 79–97)
Monocytes Absolute: 0.5 x10E3/uL (ref 0.1–0.9)
Monocytes: 6 %
Neutrophils Absolute: 5.4 x10E3/uL (ref 1.4–7.0)
Neutrophils: 65 %
Platelets: 160 x10E3/uL (ref 150–450)
RBC: 5 x10E6/uL (ref 3.77–5.28)
RDW: 12.8 % (ref 11.7–15.4)
WBC: 8.3 x10E3/uL (ref 3.4–10.8)

## 2023-11-04 LAB — LIPID PANEL
Chol/HDL Ratio: 2.3 ratio (ref 0.0–4.4)
Cholesterol, Total: 139 mg/dL (ref 100–199)
HDL: 60 mg/dL (ref >39)
LDL Chol Calc (NIH): 56 mg/dL (ref 0–99)
Triglycerides: 135 mg/dL (ref 0–149)
VLDL Cholesterol Cal: 23 mg/dL (ref 5–40)

## 2023-11-04 LAB — HEMOGLOBIN A1C
Est. average glucose Bld gHb Est-mCnc: 120 mg/dL
Hgb A1c MFr Bld: 5.8 % — ABNORMAL HIGH (ref 4.8–5.6)

## 2023-11-04 LAB — TSH: TSH: 3.33 u[IU]/mL (ref 0.450–4.500)

## 2023-11-04 LAB — HEPATITIS C ANTIBODY: Hep C Virus Ab: NONREACTIVE

## 2023-11-04 LAB — VITAMIN D 25 HYDROXY (VIT D DEFICIENCY, FRACTURES): Vit D, 25-Hydroxy: 39.6 ng/mL (ref 30.0–100.0)

## 2023-11-05 ENCOUNTER — Ambulatory Visit: Payer: Self-pay

## 2023-11-10 ENCOUNTER — Encounter: Payer: Self-pay | Admitting: Pharmacist

## 2023-11-10 ENCOUNTER — Other Ambulatory Visit: Payer: Self-pay

## 2023-11-10 ENCOUNTER — Other Ambulatory Visit (HOSPITAL_COMMUNITY): Payer: Self-pay

## 2023-11-10 ENCOUNTER — Ambulatory Visit (INDEPENDENT_AMBULATORY_CARE_PROVIDER_SITE_OTHER)

## 2023-11-10 VITALS — BP 130/75 | HR 54 | Temp 97.5°F | Ht 60.0 in | Wt 136.1 lb

## 2023-11-10 DIAGNOSIS — E785 Hyperlipidemia, unspecified: Secondary | ICD-10-CM

## 2023-11-10 DIAGNOSIS — F411 Generalized anxiety disorder: Secondary | ICD-10-CM

## 2023-11-10 DIAGNOSIS — I10 Essential (primary) hypertension: Secondary | ICD-10-CM | POA: Diagnosis not present

## 2023-11-10 DIAGNOSIS — Z720 Tobacco use: Secondary | ICD-10-CM

## 2023-11-10 DIAGNOSIS — H6990 Unspecified Eustachian tube disorder, unspecified ear: Secondary | ICD-10-CM | POA: Insufficient documentation

## 2023-11-10 DIAGNOSIS — Z0000 Encounter for general adult medical examination without abnormal findings: Secondary | ICD-10-CM

## 2023-11-10 DIAGNOSIS — B351 Tinea unguium: Secondary | ICD-10-CM

## 2023-11-10 DIAGNOSIS — J3089 Other allergic rhinitis: Secondary | ICD-10-CM | POA: Diagnosis not present

## 2023-11-10 DIAGNOSIS — H6993 Unspecified Eustachian tube disorder, bilateral: Secondary | ICD-10-CM

## 2023-11-10 MED ORDER — BUSPIRONE HCL 5 MG PO TABS
5.0000 mg | ORAL_TABLET | Freq: Two times a day (BID) | ORAL | 2 refills | Status: DC
  Filled 2023-11-10 – 2023-11-16 (×2): qty 60, 30d supply, fill #0
  Filled 2023-12-13: qty 60, 30d supply, fill #1
  Filled 2024-01-11: qty 60, 30d supply, fill #2

## 2023-11-10 MED ORDER — TERBINAFINE HCL 250 MG PO TABS
250.0000 mg | ORAL_TABLET | Freq: Every day | ORAL | 0 refills | Status: DC
  Filled 2023-11-10 – 2023-12-13 (×4): qty 30, 30d supply, fill #0

## 2023-11-10 MED ORDER — LORATADINE 10 MG PO TABS
10.0000 mg | ORAL_TABLET | Freq: Every day | ORAL | 3 refills | Status: DC
  Filled 2023-11-10 – 2024-01-11 (×4): qty 90, 90d supply, fill #0

## 2023-11-10 NOTE — Assessment & Plan Note (Signed)
 Acute bilateral ear pain possibly due to fluid buildup or TMJ disorder. - Examine ears for fluid. - Recommend Claritin  and Flonase for two weeks. - If TMJ suspected, recommend mouth guard at night, anti-inflammatories, and warm compresses.

## 2023-11-10 NOTE — Assessment & Plan Note (Signed)
 Anxiety more bothersome than depression. Current treatment with Lexapro  20 mg daily. Discussed starting Buspar  for anxiety management. - Prescribe Buspar  5 mg twice daily. - Discontinue plan for increased Lexapro  dose.

## 2023-11-10 NOTE — Assessment & Plan Note (Signed)
 BP goal <130/80. BP at goal today on recheck despite not taking her medication thsi morning. Continue losartan  25 mg. Home readings generally within target range. - Monitor blood pressure at home. - Adjust medication if home readings consistently exceed 130/80 mmHg.

## 2023-11-10 NOTE — Assessment & Plan Note (Signed)
 Last lipid panel: LDL, 56, HDL 60, Trig 135. The 10-year ASCVD risk score (Arnett DK, et al., 2019) is: 7.5% Continue rosuvastatin  40 mg daily and ezitimibe 10 mg daily.

## 2023-11-10 NOTE — Patient Instructions (Signed)
 VISIT SUMMARY: Today, you were seen for ear pain and follow-up on your lab results. We discussed several ongoing health issues and made adjustments to your treatment plan where necessary.  YOUR PLAN: -BILATERAL EAR PAIN: You have acute ear pain that may be due to fluid buildup or a jaw joint disorder. We will examine your ears for fluid. If fluid is present, you should take Zyrtec  for two weeks. If a jaw joint disorder is suspected, use a mouth guard at night, take anti-inflammatory medications, and apply warm compresses.  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 We will review the results of your bladder ultrasound scheduled for November 14, 2023.  -DEPRESSION AND ANXIETY: Your anxiety is more bothersome than your depression. We will start you on Buspar  5 mg twice daily and discontinue the plan to increase your Lexapro  dose.  -CHRONIC BACK PAIN: You have persistent back pain that worsens with physical activity. We discussed ways to manage this pain, including rest after activities that exacerbate it.  -TOBACCO USE, CURRENT DAILY SMOKER: You currently smoke about 10 cigarettes per day, which is a reduction from your previous usage. We encourage you to continue reducing your smoking and discussed the potential for medication assistance if you are interested.  -EMPHYSEMA (COPD): You have a history of emphysema but no current symptoms. The primary goal is to continue reducing your smoking.  -HYPERTENSION, CONTROLLED: Your blood pressure was slightly elevated in the office, likely because you missed your morning medication. Your home readings are generally within the target range. Continue to monitor your blood pressure at home and we will adjust your medication if your home readings consistently exceed 130/80 mmHg.  -PREDIABETES: Your A1c level is stable but remains in the prediabetic range. This is being  managed with diet and exercise.  -HYPERLIPIDEMIA, WELL CONTROLLED: Your cholesterol levels are well controlled with your current medications, rosuvastatin  and ezetimibe . Continue taking these medications as prescribed.  -ONYCHOMYCOSIS (TOENAIL FUNGAL INFECTION): Your toenail fungal infection has improved with terbinafine , but a small amount of infection remains. We will prescribe a 30-day supply of terbinafine .  INSTRUCTIONS: Please follow up after your bladder ultrasound on November 14, 2023. Continue to monitor your blood pressure at home and keep track of your readings. If you have any questions or concerns, do not hesitate to contact our office.  If you have any problems before your next visit feel free to message me via MyChart (minor issues or questions) or call the office, otherwise you may reach out to schedule an office visit.  Thank you! Saddie Sacks, PA-C

## 2023-11-10 NOTE — Assessment & Plan Note (Addendum)
 Improvement with terbinafine  250 mg x 6 weeks. Small amount of infection remains. - Prescribe a 30-day supply of terbinafine  for remaining fungus. CMP WNL.

## 2023-11-10 NOTE — Assessment & Plan Note (Signed)
 Currently smoking about 10 cigarettes per day, reduced from previous higher usage. - Encourage further reduction in smoking. - Discuss potential for medication assistance if interested.

## 2023-11-10 NOTE — Progress Notes (Signed)
 Established Patient Office Visit  Subjective   Patient ID: Regina Perry, female    DOB: 1960-04-14  Age: 63 y.o. MRN: [id number]  Chief Complaint  Patient presents with   Annual Exam    Physical    HPI History of Present Illness   Regina Perry is a 63 year old female who presents with ear pain and follow-up for lab results.  Otalgia - Bilateral ear pain, more pronounced behind the left ear - Onset this morning - No associated congestion, rhinorrhea, or ear popping - Recent exposure to loud noise and possible debris from mowing grass for five hours the previous day  Onychomycosis - Currently using terbinafine  with noted improvement - Running low on medication and may require a refill  Hyperlipidemia and prediabetes - Cholesterol well-controlled with rosuvastatin  and ezetimibe  - A1c stable in the prediabetic range, managed with diet and exercise - Previously took vitamin D  supplements but not recently  Anxiety and depression - Managed with Lexapro  20 mg daily - Symptoms include excessive worrying and low mood - Has not received additional dose of 2.5 mg of Lexapro  from the pharmacy - Hydroxyzine  25 mg prescribed for sleep   Emphysema and tobacco use - History of emphysema - No current respiratory symptoms - Smokes approximately 10 cigarettes per day, reduced from previous higher usage - Smoking further reduced to four cigarettes per day when grandchildren visit          ROS Per HPI.    Objective:     BP 130/75   Pulse (!) 54   Temp (!) 97.5 F (36.4 C) (Oral)   Ht 5' (1.524 m)   Wt 136 lb 1.9 oz (61.7 kg)   SpO2 98%   BMI 26.58 kg/m    Physical Exam Constitutional:      General: She is not in acute distress.    Appearance: Normal appearance.  Cardiovascular:     Rate and Rhythm: Normal rate and regular rhythm.     Heart sounds: Normal heart sounds. No murmur heard.    No friction rub. No gallop.  Pulmonary:     Effort: Pulmonary effort is  normal. No respiratory distress.     Breath sounds: Normal breath sounds.  Musculoskeletal:        General: No swelling.  Skin:    General: Skin is warm and dry.  Neurological:     General: No focal deficit present.     Mental Status: She is alert.  Psychiatric:        Mood and Affect: Mood normal.        Behavior: Behavior normal.        Thought Content: Thought content normal.    No results found for any visits on 11/10/23.  Last CBC Lab Results  Component Value Date   WBC 8.3 11/03/2023   HGB 15.0 11/03/2023   HCT 47.4 (H) 11/03/2023   MCV 95 11/03/2023   MCH 30.0 11/03/2023   RDW 12.8 11/03/2023   PLT 160 11/03/2023   Last metabolic panel Lab Results  Component Value Date   GLUCOSE 96 11/03/2023   NA 142 11/03/2023   K 4.6 11/03/2023   CL 106 11/03/2023   CO2 20 11/03/2023   BUN 13 11/03/2023   CREATININE 0.97 11/03/2023   EGFR 66 11/03/2023   CALCIUM  9.2 11/03/2023   PROT 6.4 11/03/2023   ALBUMIN  4.3 11/03/2023   LABGLOB 2.1 11/03/2023   AGRATIO 2.1 07/28/2022   BILITOT 0.6 11/03/2023  ALKPHOS 72 11/03/2023   AST 22 11/03/2023   ALT 20 11/03/2023   ANIONGAP 10 01/06/2021   Last lipids Lab Results  Component Value Date   CHOL 139 11/03/2023   HDL 60 11/03/2023   LDLCALC 56 11/03/2023   LDLDIRECT 67 07/28/2022   TRIG 135 11/03/2023   CHOLHDL 2.3 11/03/2023   Last hemoglobin A1c Lab Results  Component Value Date   HGBA1C 5.8 (H) 11/03/2023   Last thyroid functions Lab Results  Component Value Date   TSH 3.330 11/03/2023   Last vitamin D  Lab Results  Component Value Date   VD25OH 39.6 11/03/2023      The 10-year ASCVD risk score (Arnett DK, et al., 2019) is: 7.5%    Assessment & Plan:   Primary hypertension Assessment & Plan: BP goal <130/80. BP at goal today on recheck despite not taking her medication thsi morning. Continue losartan  25 mg. Home readings generally within target range. - Monitor blood pressure at home. - Adjust  medication if home readings consistently exceed 130/80 mmHg.    Onychomycosis of left great toe Assessment & Plan: Improvement with terbinafine  250 mg x 6 weeks. Small amount of infection remains. - Prescribe a 30-day supply of terbinafine  for remaining fungus. CMP WNL.  Orders: -     Terbinafine  HCl; Take 1 tablet (250 mg total) by mouth daily.  Dispense: 30 tablet; Refill: 0  Perennial allergic rhinitis -     Loratadine ; Take 1 tablet (10 mg total) by mouth daily.  Dispense: 90 tablet; Refill: 3  Hyperlipidemia, unspecified hyperlipidemia type Assessment & Plan: Last lipid panel: LDL, 56, HDL 60, Trig 135. The 10-year ASCVD risk score (Arnett DK, et al., 2019) is: 7.5% Continue rosuvastatin  40 mg daily and ezitimibe 10 mg daily.   Generalized anxiety disorder Assessment & Plan: Anxiety more bothersome than depression. Current treatment with Lexapro  20 mg daily. Discussed starting Buspar  for anxiety management. - Prescribe Buspar  5 mg twice daily. - Discontinue plan for increased Lexapro  dose.   Tobacco abuse Assessment & Plan: Currently smoking about 10 cigarettes per day, reduced from previous higher usage. - Encourage further reduction in smoking. - Discuss potential for medication assistance if interested.   Dysfunction of both eustachian tubes Assessment & Plan: Acute bilateral ear pain possibly due to fluid buildup or TMJ disorder. - Examine ears for fluid. - Recommend Claritin  and Flonase for two weeks. - If TMJ suspected, recommend mouth guard at night, anti-inflammatories, and warm compresses.   Other orders -     busPIRone  HCl; Take 1 tablet (5 mg total) by mouth 2 (two) times daily.  Dispense: 60 tablet; Refill: 2    Return in about 3 months (around 02/10/2024) for mood.    Saddie JULIANNA Sacks, PA-C

## 2023-11-14 ENCOUNTER — Ambulatory Visit
Admission: RE | Admit: 2023-11-14 | Discharge: 2023-11-14 | Disposition: A | Discharge status: Home / Self Care | Source: Ambulatory Visit | Attending: Urology | Admitting: Urology

## 2023-11-14 DIAGNOSIS — R310 Gross hematuria: Secondary | ICD-10-CM | POA: Insufficient documentation

## 2023-11-15 ENCOUNTER — Other Ambulatory Visit: Payer: Self-pay

## 2023-11-16 ENCOUNTER — Encounter: Payer: Self-pay | Admitting: Pharmacist

## 2023-11-16 ENCOUNTER — Other Ambulatory Visit: Payer: Self-pay

## 2023-11-21 ENCOUNTER — Other Ambulatory Visit: Payer: Self-pay

## 2023-11-23 ENCOUNTER — Other Ambulatory Visit: Payer: Self-pay

## 2023-11-23 ENCOUNTER — Other Ambulatory Visit (HOSPITAL_COMMUNITY): Payer: Self-pay

## 2023-11-27 ENCOUNTER — Ambulatory Visit: Payer: Self-pay | Admitting: Urology

## 2023-11-28 ENCOUNTER — Other Ambulatory Visit: Payer: Self-pay | Admitting: *Deleted

## 2023-11-28 DIAGNOSIS — N200 Calculus of kidney: Secondary | ICD-10-CM

## 2023-11-29 ENCOUNTER — Other Ambulatory Visit: Payer: Self-pay

## 2023-12-13 ENCOUNTER — Other Ambulatory Visit (HOSPITAL_COMMUNITY): Payer: Self-pay

## 2023-12-13 ENCOUNTER — Other Ambulatory Visit: Payer: Self-pay

## 2023-12-13 DIAGNOSIS — F331 Major depressive disorder, recurrent, moderate: Secondary | ICD-10-CM

## 2023-12-13 DIAGNOSIS — F411 Generalized anxiety disorder: Secondary | ICD-10-CM

## 2023-12-13 MED ORDER — ESCITALOPRAM OXALATE 20 MG PO TABS
20.0000 mg | ORAL_TABLET | Freq: Every day | ORAL | 0 refills | Status: DC
Start: 2023-12-13 — End: 2024-02-20
  Filled 2023-12-13 – 2024-01-11 (×2): qty 90, 90d supply, fill #0

## 2023-12-14 ENCOUNTER — Other Ambulatory Visit (HOSPITAL_COMMUNITY): Payer: Self-pay

## 2023-12-16 ENCOUNTER — Other Ambulatory Visit: Payer: Self-pay | Admitting: Family Medicine

## 2024-01-11 ENCOUNTER — Telehealth: Payer: Self-pay | Admitting: *Deleted

## 2024-01-11 ENCOUNTER — Other Ambulatory Visit (HOSPITAL_COMMUNITY): Payer: Self-pay

## 2024-01-11 NOTE — Telephone Encounter (Signed)
 LVM for pt to call to reschedule her appt on 11/17 due to the provider not being in the office that day.  If she calls back please assist in getting this rescheduled. Will also send a mychart message.

## 2024-01-12 ENCOUNTER — Other Ambulatory Visit: Payer: Self-pay

## 2024-01-31 ENCOUNTER — Emergency Department

## 2024-01-31 ENCOUNTER — Emergency Department
Admission: EM | Admit: 2024-01-31 | Discharge: 2024-01-31 | Disposition: A | Discharge status: Home / Self Care | Attending: Emergency Medicine | Admitting: Emergency Medicine

## 2024-01-31 ENCOUNTER — Ambulatory Visit: Payer: Self-pay

## 2024-01-31 ENCOUNTER — Other Ambulatory Visit: Payer: Self-pay

## 2024-01-31 ENCOUNTER — Encounter: Payer: Self-pay | Admitting: Emergency Medicine

## 2024-01-31 DIAGNOSIS — N390 Urinary tract infection, site not specified: Secondary | ICD-10-CM | POA: Insufficient documentation

## 2024-01-31 DIAGNOSIS — I10 Essential (primary) hypertension: Secondary | ICD-10-CM | POA: Insufficient documentation

## 2024-01-31 DIAGNOSIS — R300 Dysuria: Secondary | ICD-10-CM | POA: Diagnosis present

## 2024-01-31 DIAGNOSIS — N200 Calculus of kidney: Secondary | ICD-10-CM | POA: Diagnosis not present

## 2024-01-31 DIAGNOSIS — I700 Atherosclerosis of aorta: Secondary | ICD-10-CM | POA: Insufficient documentation

## 2024-01-31 DIAGNOSIS — I714 Abdominal aortic aneurysm, without rupture, unspecified: Secondary | ICD-10-CM | POA: Diagnosis not present

## 2024-01-31 LAB — CBC
HCT: 40.5 % (ref 36.0–46.0)
Hemoglobin: 13.8 g/dL (ref 12.0–15.0)
MCH: 30.3 pg (ref 26.0–34.0)
MCHC: 34.1 g/dL (ref 30.0–36.0)
MCV: 89 fL (ref 80.0–100.0)
Platelets: 161 K/uL (ref 150–400)
RBC: 4.55 MIL/uL (ref 3.87–5.11)
RDW: 12.7 % (ref 11.5–15.5)
WBC: 9.1 K/uL (ref 4.0–10.5)
nRBC: 0 % (ref 0.0–0.2)

## 2024-01-31 LAB — URINALYSIS, ROUTINE W REFLEX MICROSCOPIC
Bilirubin Urine: NEGATIVE
Glucose, UA: NEGATIVE mg/dL
Ketones, ur: NEGATIVE mg/dL
Nitrite: NEGATIVE
Protein, ur: NEGATIVE mg/dL
Specific Gravity, Urine: 1.005 (ref 1.005–1.030)
WBC, UA: >50 WBC/hpf (ref 0–5)
pH: 6 (ref 5.0–8.0)

## 2024-01-31 LAB — BASIC METABOLIC PANEL WITH GFR
Anion gap: 12 (ref 5–15)
BUN: 12 mg/dL (ref 8–23)
CO2: 20 mmol/L — ABNORMAL LOW (ref 22–32)
Calcium: 8.7 mg/dL — ABNORMAL LOW (ref 8.9–10.3)
Chloride: 107 mmol/L (ref 98–111)
Creatinine, Ser: 0.82 mg/dL (ref 0.44–1.00)
GFR, Estimated: >60 mL/min (ref >60)
Glucose, Bld: 85 mg/dL (ref 70–99)
Potassium: 3.7 mmol/L (ref 3.5–5.1)
Sodium: 139 mmol/L (ref 135–145)

## 2024-01-31 MED ORDER — ONDANSETRON 4 MG PO TBDP
4.0000 mg | ORAL_TABLET | Freq: Once | ORAL | Status: AC
  Administered 2024-01-31: 4 mg via ORAL
  Filled 2024-01-31: qty 1

## 2024-01-31 MED ORDER — LIDOCAINE HCL (PF) 1 % IJ SOLN
5.0000 mL | Freq: Once | INTRAMUSCULAR | Status: AC
  Administered 2024-01-31: 5 mL
  Filled 2024-01-31: qty 5

## 2024-01-31 MED ORDER — ONDANSETRON 4 MG PO TBDP: 4.0000 mg | ORAL_TABLET | Freq: Three times a day (TID) | ORAL | 0 refills | Status: DC | PRN

## 2024-01-31 MED ORDER — FLUCONAZOLE 150 MG PO TABS: 150.0000 mg | ORAL_TABLET | Freq: Every day | ORAL | 0 refills | Status: DC

## 2024-01-31 MED ORDER — TAMSULOSIN HCL 0.4 MG PO CAPS: 0.4000 mg | ORAL_CAPSULE | Freq: Every day | ORAL | 1 refills | Status: DC

## 2024-01-31 MED ORDER — CEPHALEXIN 500 MG PO CAPS: 500.0000 mg | ORAL_CAPSULE | Freq: Three times a day (TID) | ORAL | 0 refills | Status: AC

## 2024-01-31 MED ORDER — CEFTRIAXONE SODIUM 1 G IJ SOLR
1.0000 g | Freq: Once | INTRAMUSCULAR | Status: AC
  Administered 2024-01-31: 1 g via INTRAMUSCULAR
  Filled 2024-01-31: qty 10

## 2024-01-31 NOTE — Telephone Encounter (Signed)
 FYI Only or Action Required?: FYI only for provider: to UC, no appointment available.  Patient was last seen in primary care on 11/10/2023 by Gayle Saddie FALCON, PA-C.  Called Nurse Triage reporting Dysuria.  Symptoms began a week ago.  Interventions attempted: Nothing.  Symptoms are: gradually worsening.  Triage Disposition: See HCP Within 4 Hours (Or PCP Triage)  Patient/caregiver understands and will follow disposition?: Yes  Copied from CRM #[id number]. Topic: Clinical - Red Word Triage >> Jan 31, 2024 10:39 AM Zy'onna H wrote: [facility] that prompted transfer to Nurse Triage: Began as a Migraine last week, now patient is having the following symptoms:  Foul smelling urine Pain in Pelvic Area Pain when Urinating  Kidney Stone in Right Kidney  Transf. To NT Reason for Disposition  [1] SEVERE pain with urination (e.g., excruciating) AND [2] not improved after 2 hours of pain medicine  Answer Assessment - Initial Assessment Questions 1. SEVERITY: How bad is the pain?  (e.g., Scale 1-10; mild, moderate, or severe)     10/10 2. FREQUENCY: How many times have you had painful urination today?      Each urination  3. PATTERN: Is pain present every time you urinate or just sometimes?      Every time 4. ONSET: When did the painful urination start?      One week ago 5. FEVER: Do you have a fever? If Yes, ask: What is your temperature, how was it measured, and when did it start?     denies 6. PAST UTI: Have you had a urine infection before? If Yes, ask: When was the last time? and What happened that time?      Positive for past uti 7. CAUSE: What do you think is causing the painful urination?  (e.g., UTI, scratch, Herpes sore)     UTI or kidney stone 8. OTHER SYMPTOMS: Do you have any other symptoms? (e.g., blood in urine, flank pain, genital sores, urgency, vaginal discharge)     Pelvic pain, foul smelling urine  Protocols used: Urination Pain - Female-A-AH

## 2024-01-31 NOTE — Discharge Instructions (Addendum)
 You were evaluated in the ED for urinary frequency and burning with urination.  Your urinalysis reveals a urinary tract infection with hematuria.  Your CT renal study shows a nonobstructing 8 mm stone to the lower pole of the left kidney.  There is mild left perinephric fat stranding as well as mural thickening of the left renal pelvis and proximal left ureter (suggesting underlying infection of the kidney).  We have provided you with Rocephin and a biotic while in the ED.  You will need to continue oral antibiotic outpatient as instructed.  Please follow-up with your urologist in 1 week for further management.  If any new or worsening symptoms occur please return to ED for further evaluation.

## 2024-01-31 NOTE — ED Provider Notes (Signed)
 [facility] Emergency Department Provider Note     Event Date/Time   First MD Initiated Contact with Patient 01/31/24 1540     (approximate)   History   Urinary Frequency   HPI  Regina Perry is a 63 y.o. female with past medical history of HTN, GERD, anxiety presents to the ED for evaluation of urinary frequency and dysuria x 1 week.  Patient endorses hematuria but this has been ongoing for the past 5 years as she has a known kidney stone.  Patient follows Dr. Twylla with urology.  Chart reviewed -urology 08/06 patient had normal cystoscopy. 08/19 ultrasound of renal performed showed 1.4 cm nonobstructing stone present at the left lower pole.  Measured 6 mm on 08/01.     Physical Exam   Triage Vital Signs: ED Triage Vitals  Encounter Vitals Group     BP 01/31/24 1521 (!) 146/88     Girls Systolic BP Percentile --      Girls Diastolic BP Percentile --      Boys Systolic BP Percentile --      Boys Diastolic BP Percentile --      Pulse Rate 01/31/24 1521 66     Resp 01/31/24 1521 18     Temp 01/31/24 1521 98 F (36.7 C)     Temp Source 01/31/24 1521 Oral     SpO2 01/31/24 1521 100 %     Weight 01/31/24 1521 130 lb (59 kg)     Height 01/31/24 1521 5' 2 (1.575 m)     Head Circumference --      Peak Flow --      Pain Score 01/31/24 1527 9     Pain Loc --      Pain Education --      Exclude from Growth Chart --     Most recent vital signs: Vitals:   01/31/24 1521  BP: (!) 146/88  Pulse: 66  Resp: 18  Temp: 98 F (36.7 C)  SpO2: 100%    General Awake, no distress.  HEENT NCAT.  CV:  Good peripheral perfusion.  RESP:  Normal effort.  ABD:  No distention.  Soft.  Mild suprapubic tenderness.  Positive bilateral CVA tenderness.   ED Results / Procedures / Treatments   Labs (all labs ordered are listed, but only abnormal results are displayed) Labs Reviewed  URINALYSIS, ROUTINE W REFLEX MICROSCOPIC - Abnormal; Notable for the  following components:      Result Value   Color, Urine YELLOW (*)    APPearance HAZY (*)    Hgb urine dipstick MODERATE (*)    Leukocytes,Ua MODERATE (*)    Bacteria, UA FEW (*)    All other components within normal limits  BASIC METABOLIC PANEL WITH GFR - Abnormal; Notable for the following components:   CO2 20 (*)    Calcium  8.7 (*)    All other components within normal limits  URINE CULTURE  CBC    RADIOLOGY  I personally viewed and evaluated these images as part of my medical decision making, as well as reviewing the written report by the radiologist.   CT Renal Stone Study Result Date: 01/31/2024 CLINICAL DATA:  Abdominal and flank pain, urinary frequency and dysuria EXAM: CT ABDOMEN AND PELVIS WITHOUT CONTRAST TECHNIQUE: Multidetector CT imaging of the abdomen and pelvis was performed following the standard protocol without IV contrast. RADIATION DOSE REDUCTION: This exam was performed according to the departmental dose-optimization program which includes automated  exposure control, adjustment of the mA and/or kV according to patient size and/or use of iterative reconstruction technique. COMPARISON:  03/07/2022 FINDINGS: Lower chest: No acute pleural or parenchymal lung disease. Hepatobiliary: Unremarkable unenhanced appearance of the liver and gallbladder. No biliary duct dilation. Pancreas: Unremarkable unenhanced appearance. Spleen: Unremarkable unenhanced appearance. Adrenals/Urinary Tract: There is an 8 mm nonobstructing calculus within the lower pole left kidney. Mild distension of the left renal pelvis without frank hydronephrosis or hydroureter. There appears to be mural thickening of the left renal pelvis and proximal left ureter which could signify underlying urinary tract infection. Mild left perinephric fat stranding may also be indicative of infection. Please correlate with urinalysis. No evidence of right-sided calculi or obstructive uropathy. The adrenals and bladder are  unremarkable. Stomach/Bowel: No bowel obstruction or ileus. Normal appendix right lower quadrant. No bowel wall thickening or inflammatory change. Vascular/Lymphatic: 3.3 cm infrarenal abdominal aortic aneurysm, previously having measured 3.1 cm. Assessment of the vascular lumen cannot be performed without intravenous contrast. Diffuse atherosclerosis of the aorta and its distal branches. No pathologic adenopathy. Reproductive: Status post hysterectomy. No adnexal masses. Other: No free fluid or free intraperitoneal gas. No abdominal wall hernia. Musculoskeletal: No acute or destructive bony abnormalities. Reconstructed images demonstrate no additional findings. IMPRESSION: 1. Nonobstructing 8 mm calculus lower pole left kidney. 2. Mild left perinephric fat stranding as well as mural thickening of the left renal pelvis and proximal left ureter. Findings may suggest underlying infection, and correlation with urinalysis is recommended. 3. Abdominal aortic aneurysm measuring 3.3 cm. Recommend surveillance ultrasound in 3 years. Reference: Journal of Vascular Surgery 67.1 (2018): 2-77. J Am Coll Radiol 2013;10:789-794. 4.  Aortic Atherosclerosis (ICD10-I70.0). Electronically Signed   By: Ozell Daring M.D.   On: 01/31/2024 17:57    PROCEDURES:  Critical Care performed: No  Procedures   MEDICATIONS ORDERED IN ED: Medications  cefTRIAXone (ROCEPHIN) injection 1 g (has no administration in time range)  lidocaine  (PF) (XYLOCAINE ) 1 % injection 5 mL (has no administration in time range)  ondansetron  (ZOFRAN -ODT) disintegrating tablet 4 mg (4 mg Oral Given 01/31/24 1834)     IMPRESSION / MDM / ASSESSMENT AND PLAN / ED COURSE  I reviewed the triage vital signs and the nursing notes.                              Clinical Course as of 01/31/24 1913  Wed Jan 31, 2024  1638 Urinalysis, Routine w reflex microscopic -Urine, Clean Catch(!) Infectious etiology.  Plus moderate Hgb will obtain CT renal as  patient has a history of left kidney stone x 5 years.  Chart reviewed -US  performed 8/25 shows known stone has grown in size. [MH]  1900 CT Renal Stone Study IMPRESSION: 1. Nonobstructing 8 mm calculus lower pole left kidney. 2. Mild left perinephric fat stranding as well as mural thickening of the left renal pelvis and proximal left ureter. Findings may suggest underlying infection, and correlation with urinalysis is recommended. 3. Abdominal aortic aneurysm measuring 3.3 cm. Recommend surveillance ultrasound in 3 years. Reference: Journal of Vascular Surgery 67.1 (2018): 2-77. J Am Coll Radiol 2013;10:789-794. 4.  Aortic Atherosclerosis (ICD10-I70.0).   [MH]  1900 Urinalysis, Routine w reflex microscopic -Urine, Clean Catch(!) [MH]    Clinical Course User Index [MH] Margrette, Derya Dettmann A, PA-C    63 y.o. female presents to the emergency department for evaluation and treatment of dysuria. See HPI for further details.  Differential diagnosis includes, but is not limited to UTI, pyelonephritis, nephrolithiasis, hydronephrosis  Patient's presentation is most consistent with acute complicated illness / injury requiring diagnostic workup.  Patient is alert and oriented.  She is hemodynamic stable and afebrile.  Physical exam findings are stated above.  Lab work is reassuring including WBC and kidney function.  Urinalysis reveals moderate Hgb and moderate leukocytes.  CT renal stone study shows nonobstructing 8 mm stone in left kidney with mild perinephric fat stranding and mural thickening suggestive of underlying infection which is consistent with urinalysis.  Urine culture pending.  Patient given dose of ceftriaxone in ED.  Will continue oral antibiotics outpatient with Keflex .  Patient admits to yeast infections after antibiotic use.  Will prescribe Diflucan.  Tamsulosin and Zofran  also prescribed.  Patient is advised to follow-up with urology in 1 week.  She verbalized understanding.  She is in  stable condition for discharge home.  ED return precaution discussed.  FINAL CLINICAL IMPRESSION(S) / ED DIAGNOSES   Final diagnoses:  Urinary tract infection with hematuria, site unspecified   Rx / DC Orders   ED Discharge Orders          Ordered    ondansetron  (ZOFRAN -ODT) 4 MG disintegrating tablet  Every 8 hours PRN        01/31/24 1905    cephALEXin  (KEFLEX ) 500 MG capsule  3 times daily        01/31/24 1905    fluconazole (DIFLUCAN) 150 MG tablet  Daily        01/31/24 1905    tamsulosin (FLOMAX) 0.4 MG CAPS capsule  Daily        01/31/24 1905           Note:  This document was prepared using Dragon voice recognition software and may include unintentional dictation errors.    Margrette Monte A, PA-C 01/31/24 LLEWELLYN    Arlander Charleston, MD 01/31/24 [phone]

## 2024-01-31 NOTE — ED Notes (Signed)
 Pt verbalizes understanding of discharge instructions. Opportunity for questioning and answers were provided. Pt discharged from ED

## 2024-01-31 NOTE — ED Triage Notes (Signed)
 Patient to ED via POV for urinary frequency and burning. Ongoing x1 week. Aox4. NAD noted

## 2024-02-03 LAB — URINE CULTURE: Culture: 80000 — AB

## 2024-02-06 ENCOUNTER — Ambulatory Visit (INDEPENDENT_AMBULATORY_CARE_PROVIDER_SITE_OTHER): Admitting: Physician Assistant

## 2024-02-06 ENCOUNTER — Other Ambulatory Visit (HOSPITAL_COMMUNITY): Payer: Self-pay

## 2024-02-06 VITALS — BP 137/68 | HR 71 | Ht 62.0 in | Wt 130.0 lb

## 2024-02-06 DIAGNOSIS — N200 Calculus of kidney: Secondary | ICD-10-CM | POA: Diagnosis not present

## 2024-02-06 DIAGNOSIS — N12 Tubulo-interstitial nephritis, not specified as acute or chronic: Secondary | ICD-10-CM

## 2024-02-06 LAB — URINALYSIS, COMPLETE
Bilirubin, UA: NEGATIVE
Glucose, UA: NEGATIVE
Ketones, UA: NEGATIVE
Leukocytes,UA: NEGATIVE
Nitrite, UA: NEGATIVE
Specific Gravity, UA: 1.015 (ref 1.005–1.030)
Urobilinogen, Ur: 0.2 mg/dL (ref 0.2–1.0)
pH, UA: 6 (ref 5.0–7.5)

## 2024-02-06 LAB — MICROSCOPIC EXAMINATION

## 2024-02-06 MED ORDER — SULFAMETHOXAZOLE-TRIMETHOPRIM 800-160 MG PO TABS
1.0000 | ORAL_TABLET | Freq: Two times a day (BID) | ORAL | 0 refills | Status: AC
  Filled 2024-02-06: qty 14, 7d supply, fill #0

## 2024-02-06 NOTE — Progress Notes (Signed)
 02/06/2024 2:38 PM   Regina Perry 04/24/1960 [id number]  CC: Chief Complaint  Patient presents with   Follow-up   Recurrent UTI   Nephrolithiasis   HPI: Regina Perry is a 63 y.o. female with PMH nephrolith diocese and intermittent gross hematuria with benign cystoscopy earlier this year who presents today for ED follow-up.   She was seen in the ED on 01/31/2024 with urinary frequency and dysuria x 1 week.  UA was grossly positive with pyuria and bacteriuria.  Urine culture grew pansensitive E. coli.  CT stone study showed mild left perinephric stranding and a nonobstructing 8 mm left lower pole stone.  She was treated with IM Rocephin and Keflex  500 mg 3 times daily x 10 days.  Today she reports she is feeling somewhat better, but not yet back to baseline.  She would like to pursue definitive management of her left renal stone, though she understands that this was likely unrelated to her recent UTI.  She remains on Keflex  today and has about 4 days remaining.  Notably, she is scheduled for cardiac catheterization on 02/21/2024.  In-office UA today positive for trace protein and 1+ blood; urine microscopy with 3-10 RBCs/HPF.  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   CORONARY ARTER(CABG) X2  LIMA to LAD and SVG TO OM1 01/09/19    Vitamin D  deficiency     Surgical  History: Past Surgical History:  Procedure Laterality Date   BREAST BIOPSY     CESAREAN SECTION     COLONOSCOPY     COLONOSCOPY N/A 07/07/2022   Procedure: COLONOSCOPY;  Surgeon: Onita Elspeth Sharper, DO;  Location: [facility] ENDOSCOPY;  Service: Gastroenterology;  Laterality: N/A;   CORONARY ANGIOPLASTY     CORONARY ARTERY BYPASS GRAFT N/A 01/09/2019   Procedure: CORONARY ARTERY BYPASS GRAFTING (CABG) X2 USING LEFT INTERNAL MAMMARY ARTERY TO CIRC AND RIGHT GREATER SAPHENOUS VEIN TO LAD.;  Surgeon: Shyrl Linnie KIDD, MD;  Location: [facility] OR;  Service: Open Heart Surgery;  Laterality: N/A;   CORONARY ARTERY BYPASS GRAFT     ESOPHAGOGASTRODUODENOSCOPY N/A 07/07/2022   Procedure: ESOPHAGOGASTRODUODENOSCOPY (EGD);  Surgeon: Onita Elspeth Sharper, DO;  Location: [facility] ENDOSCOPY;  Service: Gastroenterology;  Laterality: N/A;   ETHMOIDECTOMY Bilateral 06/26/2014   Procedure: BILATERAL ANTERIOR ETHMOIDECTOMY;  Surgeon: Lonni FORBES Angle, MD;  Location: [facility];  Service: ENT;  Laterality: Bilateral;   LEFT HEART CATH AND CORONARY ANGIOGRAPHY N/A 01/08/2019   Procedure: LEFT HEART CATH AND CORONARY ANGIOGRAPHY;  Surgeon: Elmira Newman JINNY, MD;  Location: [facility] INVASIVE CV LAB;  Service: Cardiovascular;  Laterality: N/A;   MAXILLARY ANTROSTOMY Bilateral 06/26/2014   Procedure: BILATERAL MAXILLARY OSTEA ENLARGEMENT;  Surgeon: Lonni FORBES Angle, MD;  Location: [facility];  Service: ENT;  Laterality: Bilateral;   NASAL SEPTOPLASTY W/ TURBINOPLASTY Bilateral 06/26/2014   Procedure: BILATERAL NASAL SEPTOPLASTY WITH TURBINATE REDUCTION;  Surgeon: Lonni FORBES Angle, MD;  Location:  SURGERY CENTER;  Service: ENT;  Laterality: Bilateral;   SHOULDER ARTHROSCOPY W/ ROTATOR CUFF REPAIR  08/2013   right   TEE WITHOUT CARDIOVERSION  01/09/2019   Procedure: Transesophageal Echocardiogram (Tee);  Surgeon: Shyrl Linnie KIDD, MD;  Location: [facility] OR;  Service: Open Heart  Surgery;;   TONSILLECTOMY AND ADENOIDECTOMY     TOTAL ABDOMINAL HYSTERECTOMY     partial   TUBAL LIGATION      Home Medications:  Allergies as of 02/06/2024       Reactions   Levofloxacin Nausea Only   Percocet [oxycodone -acetaminophen ] Other (See Comments)   Sick on stomach when taken with anesthesia         Medication List        Accurate as of February 06, 2024  2:38 PM. If you have any questions, ask your nurse or doctor.          STOP taking these medications    estradiol 0.1 MG/GM vaginal cream Commonly known as: ESTRACE   hydrOXYzine  25 MG capsule Commonly known as: VISTARIL        TAKE these medications    acetaminophen  500 MG tablet Commonly known as: TYLENOL  Take 1,000 mg by mouth every 6 (six) hours as needed for moderate pain or headache.   busPIRone  5 MG tablet Commonly known as: BUSPAR  Take 1 tablet (5 mg total) by mouth 2 (two) times daily.   cephALEXin  500 MG capsule Commonly known as: KEFLEX  Take 1 capsule (500 mg total) by mouth 3 (three) times daily for 10 days.   clopidogrel  75 MG tablet Commonly known as: PLAVIX  TAKE 1 TABLET EVERY DAY   escitalopram  20 MG tablet Commonly known as: LEXAPRO  Take 1 tablet (20 mg total) by mouth at bedtime.   ezetimibe  10 MG tablet Commonly known as: ZETIA  Take 1 tablet (10 mg total) by mouth daily.   fluconazole 150 MG tablet Commonly known as: Diflucan Take 1 tablet (150 mg total) by mouth daily.   gabapentin  300 MG capsule Commonly known as: NEURONTIN  Take 1 capsule (300 mg total) by mouth 3 (three) times daily.   HYDROcodone -acetaminophen  10-325 MG tablet Commonly known as: NORCO Take 1 tablet by mouth every 6 (six) hours as needed for moderate pain.   loratadine  10 MG tablet Commonly known as: CLARITIN  Take 1 tablet (10 mg total) by mouth daily.   LORazepam 0.5 MG tablet Commonly known as: ATIVAN Take 0.5 mg by mouth every 6 (six) hours as needed for anxiety.   losartan  25 MG  tablet Commonly known as: COZAAR  TAKE 1 TABLET EVERY EVENING   metoprolol  succinate 25 MG 24 hr tablet Commonly known as: TOPROL -XL TAKE 1 TABLET EVERY DAY   nitroGLYCERIN  0.4 MG SL tablet Commonly known as: NITROSTAT  Place 1 tablet (0.4 mg total) under the tongue every 5 (five) minutes as needed for chest pain.   ondansetron  4 MG disintegrating tablet Commonly known as: ZOFRAN -ODT Take 1 tablet (4 mg total) by mouth every 8 (eight) hours as needed.   pantoprazole  40 MG tablet Commonly known as: PROTONIX  TAKE 1 TABLET EVERY DAY   promethazine  25 MG tablet Commonly known as: PHENERGAN  Take 1 tablet (25 mg total) by mouth every 6 (six) hours as needed for nausea or vomiting.   rosuvastatin  40 MG tablet Commonly known as:  CRESTOR  TAKE 1 TABLET EVERY DAY   SUMAtriptan  100 MG tablet Commonly known as: IMITREX  TAKE 1 TABLET EVERY 2 HOURS AS NEEDED FOR MIGRAINE OR HEADACHE AS DIRECTED   tamsulosin 0.4 MG Caps capsule Commonly known as: FLOMAX Take 1 capsule (0.4 mg total) by mouth daily.   terbinafine  250 MG tablet Commonly known as: LAMISIL  Take 1 tablet (250 mg total) by mouth daily.        Allergies:  Allergies  Allergen Reactions   Levofloxacin Nausea Only   Percocet [Oxycodone -Acetaminophen ] Other (See Comments)    Sick on stomach when taken with anesthesia     Family History: Family History  Problem Relation Age of Onset   Heart disease Mother        MI   Irritable bowel syndrome Mother    Migraines Mother    COPD Mother    Emphysema Mother    Cerebral aneurysm Father    Pancreatic cancer Father    AAA (abdominal aortic aneurysm) Sister    Breast cancer Paternal Grandmother    Lung cancer Paternal Grandfather    Colon cancer Neg Hx    Esophageal cancer Neg Hx    Rectal cancer Neg Hx    Stomach cancer Neg Hx     Social History:   reports that she has been smoking cigarettes. She started smoking about 5 years ago. She has a 36.3 pack-year smoking  history. She has been exposed to tobacco smoke. She has never used smokeless tobacco. She reports that she does not drink alcohol and does not use drugs.  Physical Exam: BP 137/68 (BP Location: Left Arm, Patient Position: Sitting, Cuff Size: Normal)   Pulse 71   Ht 5' 2 (1.575 m)   Wt 130 lb (59 kg)   SpO2 95%   BMI 23.78 kg/m   Constitutional:  Alert and oriented, no acute distress, nontoxic appearing HEENT: , AT Cardiovascular: No clubbing, cyanosis, or edema Respiratory: Normal respiratory effort, no increased work of breathing Skin: No rashes, bruises or suspicious lesions Neurologic: Grossly intact, no focal deficits, moving all 4 extremities Psychiatric: Normal mood and affect  Laboratory Data: Results for orders placed or performed during the hospital encounter of 01/31/24  Urine Culture   Collection Time: 01/31/24  3:20 PM   Specimen: Urine, Clean Catch  Result Value Ref Range   Specimen Description      URINE, CLEAN CATCH Performed at [facility], [address], [place], [place] [address]    Special Requests      NONE Performed at [facility], [address], [place], [place] [address]    Culture 80,000 COLONIES/mL ESCHERICHIA COLI (A)    Report Status 02/03/2024 FINAL    Organism ID, Bacteria ESCHERICHIA COLI (A)       Susceptibility   Escherichia coli - MIC*    AMPICILLIN 8 SENSITIVE Sensitive     CEFAZOLIN  (URINE) Value in next row Sensitive      <=1 SENSITIVEThis is a modified FDA-approved test that has been validated and its performance characteristics determined by the reporting laboratory.  This laboratory is certified under the Clinical Laboratory Improvement Amendments CLIA as qualified to perform high complexity clinical laboratory testing.    CEFEPIME Value in next row Sensitive      <=1 SENSITIVEThis is a modified FDA-approved test that has been validated and its performance characteristics determined by the reporting laboratory.   This laboratory is certified under the Clinical Laboratory Improvement Amendments CLIA as qualified to  perform high complexity clinical laboratory testing.    ERTAPENEM Value in next row Sensitive      <=1 SENSITIVEThis is a modified FDA-approved test that has been validated and its performance characteristics determined by the reporting laboratory.  This laboratory is certified under the Clinical Laboratory Improvement Amendments CLIA as qualified to perform high complexity clinical laboratory testing.    CEFTRIAXONE Value in next row Sensitive      <=1 SENSITIVEThis is a modified FDA-approved test that has been validated and its performance characteristics determined by the reporting laboratory.  This laboratory is certified under the Clinical Laboratory Improvement Amendments CLIA as qualified to perform high complexity clinical laboratory testing.    CIPROFLOXACIN Value in next row Sensitive      <=1 SENSITIVEThis is a modified FDA-approved test that has been validated and its performance characteristics determined by the reporting laboratory.  This laboratory is certified under the Clinical Laboratory Improvement Amendments CLIA as qualified to perform high complexity clinical laboratory testing.    GENTAMICIN Value in next row Sensitive      <=1 SENSITIVEThis is a modified FDA-approved test that has been validated and its performance characteristics determined by the reporting laboratory.  This laboratory is certified under the Clinical Laboratory Improvement Amendments CLIA as qualified to perform high complexity clinical laboratory testing.    NITROFURANTOIN  Value in next row Sensitive      <=1 SENSITIVEThis is a modified FDA-approved test that has been validated and its performance characteristics determined by the reporting laboratory.  This laboratory is certified under the Clinical Laboratory Improvement Amendments CLIA as qualified to perform high complexity clinical laboratory testing.     TRIMETH /SULFA  Value in next row Sensitive      <=1 SENSITIVEThis is a modified FDA-approved test that has been validated and its performance characteristics determined by the reporting laboratory.  This laboratory is certified under the Clinical Laboratory Improvement Amendments CLIA as qualified to perform high complexity clinical laboratory testing.    AMPICILLIN/SULBACTAM Value in next row Sensitive      <=1 SENSITIVEThis is a modified FDA-approved test that has been validated and its performance characteristics determined by the reporting laboratory.  This laboratory is certified under the Clinical Laboratory Improvement Amendments CLIA as qualified to perform high complexity clinical laboratory testing.    PIP/TAZO Value in next row Sensitive      <=4 SENSITIVEThis is a modified FDA-approved test that has been validated and its performance characteristics determined by the reporting laboratory.  This laboratory is certified under the Clinical Laboratory Improvement Amendments CLIA as qualified to perform high complexity clinical laboratory testing.    MEROPENEM Value in next row Sensitive      <=4 SENSITIVEThis is a modified FDA-approved test that has been validated and its performance characteristics determined by the reporting laboratory.  This laboratory is certified under the Clinical Laboratory Improvement Amendments CLIA as qualified to perform high complexity clinical laboratory testing.    * 80,000 COLONIES/mL ESCHERICHIA COLI  Urinalysis, Routine w reflex microscopic -Urine, Clean Catch   Collection Time: 01/31/24  3:20 PM  Result Value Ref Range   Color, Urine YELLOW (A) YELLOW   APPearance HAZY (A) CLEAR   Specific Gravity, Urine 1.005 1.005 - 1.030   pH 6.0 5.0 - 8.0   Glucose, UA NEGATIVE NEGATIVE mg/dL   Hgb urine dipstick MODERATE (A) NEGATIVE   Bilirubin Urine NEGATIVE NEGATIVE   Ketones, ur NEGATIVE NEGATIVE mg/dL   Protein, ur NEGATIVE NEGATIVE mg/dL   Nitrite NEGATIVE  NEGATIVE   Leukocytes,Ua MODERATE (A) NEGATIVE   RBC / HPF 0-5 0 - 5 RBC/hpf   WBC, UA >50 0 - 5 WBC/hpf   Bacteria, UA FEW (A) NONE SEEN   Squamous Epithelial / HPF 0-5 0 - 5 /HPF   Mucus PRESENT   CBC   Collection Time: 01/31/24  6:40 PM  Result Value Ref Range   WBC 9.1 4.0 - 10.5 K/uL   RBC 4.55 3.87 - 5.11 MIL/uL   Hemoglobin 13.8 12.0 - 15.0 g/dL   HCT 59.4 63.9 - 53.9 %   MCV 89.0 80.0 - 100.0 fL   MCH 30.3 26.0 - 34.0 pg   MCHC 34.1 30.0 - 36.0 g/dL   RDW 87.2 88.4 - 84.4 %   Platelets 161 150 - 400 K/uL   nRBC 0.0 0.0 - 0.2 %  Basic metabolic panel   Collection Time: 01/31/24  6:40 PM  Result Value Ref Range   Sodium 139 135 - 145 mmol/L   Potassium 3.7 3.5 - 5.1 mmol/L   Chloride 107 98 - 111 mmol/L   CO2 20 (L) 22 - 32 mmol/L   Glucose, Bld 85 70 - 99 mg/dL   BUN 12 8 - 23 mg/dL   Creatinine, Ser 9.17 0.44 - 1.00 mg/dL   Calcium  8.7 (L) 8.9 - 10.3 mg/dL   GFR, Estimated >39 >39 mL/min   Anion gap 12 5 - 15   Pertinent Imaging: Results for orders placed during the hospital encounter of 01/31/24  CT Renal Stone Study  Narrative CLINICAL DATA:  Abdominal and flank pain, urinary frequency and dysuria  EXAM: CT ABDOMEN AND PELVIS WITHOUT CONTRAST  TECHNIQUE: Multidetector CT imaging of the abdomen and pelvis was performed following the standard protocol without IV contrast.  RADIATION DOSE REDUCTION: This exam was performed according to the departmental dose-optimization program which includes automated exposure control, adjustment of the mA and/or kV according to patient size and/or use of iterative reconstruction technique.  COMPARISON:  03/07/2022  FINDINGS: Lower chest: No acute pleural or parenchymal lung disease.  Hepatobiliary: Unremarkable unenhanced appearance of the liver and gallbladder. No biliary duct dilation.  Pancreas: Unremarkable unenhanced appearance.  Spleen: Unremarkable unenhanced appearance.  Adrenals/Urinary Tract: There  is an 8 mm nonobstructing calculus within the lower pole left kidney. Mild distension of the left renal pelvis without frank hydronephrosis or hydroureter. There appears to be mural thickening of the left renal pelvis and proximal left ureter which could signify underlying urinary tract infection. Mild left perinephric fat stranding may also be indicative of infection. Please correlate with urinalysis.  No evidence of right-sided calculi or obstructive uropathy. The adrenals and bladder are unremarkable.  Stomach/Bowel: No bowel obstruction or ileus. Normal appendix right lower quadrant. No bowel wall thickening or inflammatory change.  Vascular/Lymphatic: 3.3 cm infrarenal abdominal aortic aneurysm, previously having measured 3.1 cm. Assessment of the vascular lumen cannot be performed without intravenous contrast. Diffuse atherosclerosis of the aorta and its distal branches. No pathologic adenopathy.  Reproductive: Status post hysterectomy. No adnexal masses.  Other: No free fluid or free intraperitoneal gas. No abdominal wall hernia.  Musculoskeletal: No acute or destructive bony abnormalities. Reconstructed images demonstrate no additional findings.  IMPRESSION: 1. Nonobstructing 8 mm calculus lower pole left kidney. 2. Mild left perinephric fat stranding as well as mural thickening of the left renal pelvis and proximal left ureter. Findings may suggest underlying infection, and correlation with urinalysis is recommended. 3. Abdominal aortic aneurysm measuring 3.3 cm. Recommend surveillance  ultrasound in 3 years. Reference: Journal of Vascular Surgery 67.1 (2018): 2-77. J Am Coll Radiol 2013;10:789-794. 4.  Aortic Atherosclerosis (ICD10-I70.0).   Electronically Signed By: Ozell Daring M.D. On: 01/31/2024 17:57  I personally reviewed the images referenced above and note an 8 mm nonobstructing left lower pole stone.  Assessment & Plan:   1. Pyelonephritis of left  kidney (Primary) On culture appropriate antibiotics for complicated UTI.  Will switch to Bactrim  for greater tissue penetration in the setting of perinephric stranding on recent CT. - Urinalysis, Complete - sulfamethoxazole -trimethoprim  (BACTRIM  DS) 800-160 MG tablet; Take 1 tablet by mouth 2 (two) times daily for 7 days.  Dispense: 14 tablet; Refill: 0  2. Left renal stone Nonobstructing, not associated with recent UTI.  That said, we discussed consideration of definitive stone management and she would like to proceed with this.  We discussed various treatment options for her stone including ESWL vs. ureteroscopy with laser lithotripsy and stent.   We specifically discussed that ESWL is a less invasive procedure that requires less anesthesia, however patients have to pass their residual stone fragments, which may take several weeks and be associated with continued renal colic. Additionally, we discussed the limitations of ESWL including the low, 10-20% chance of treatment failure requiring repeat ESWL versus ureteroscopy in the future. By comparison, ureteroscopy is a more invasive surgical procedure that requires more anesthesia, but it does require placement of a ureteral stent, which will remain in place for approximately 3-10 days and can be associated with flank pain, bladder pain, dysuria, urgency, frequency, urinary leakage, and gross hematuria.  Based on this conversation, she would like to proceed with URS.  OR orders placed.  She will require cardiac clearance and it we will need to defer this until after her upcoming cardiac catheterization. - CULTURE, URINE COMPREHENSIVE - Ambulatory Referral For Surgery Scheduling   Return for Will call to schedule surgery.  Lucie Hones, PA-C  [facility]  [address] [place], [place] [address] [phone]

## 2024-02-12 ENCOUNTER — Ambulatory Visit: Payer: Self-pay | Admitting: Physician Assistant

## 2024-02-12 ENCOUNTER — Other Ambulatory Visit: Payer: Self-pay

## 2024-02-12 ENCOUNTER — Ambulatory Visit

## 2024-02-12 DIAGNOSIS — N200 Calculus of kidney: Secondary | ICD-10-CM

## 2024-02-12 LAB — CULTURE, URINE COMPREHENSIVE

## 2024-02-12 NOTE — Progress Notes (Signed)
 Surgical Physician Order Form [facility]  Dr. Twylla * Scheduling expectation : Per patience preference, after cardiac cath on 11/26  *Length of Case:   *Clearance needed: yes, cardiac  *Anticoagulation Instructions: N/A  *Aspirin  Instructions: Ok to continue all  *Post-op visit Date/Instructions:  TBD  *Diagnosis: Left Nephrolithiasis  *Procedure: left Ureteroscopy w/laser lithotripsy & stent placement (47643)   Additional orders: N/A  -Admit type: OUTpatient  -Anesthesia: General  -VTE Prophylaxis Standing Order SCD's       Other:   -Standing Lab Orders Per Anesthesia    Lab other: Maybe UA/cx depending on timing, done on 11/11  -Standing Test orders EKG/Chest x-ray per Anesthesia       Test other:   - Medications:  Ancef  2gm IV  -Other orders:  N/A

## 2024-02-20 ENCOUNTER — Ambulatory Visit (INDEPENDENT_AMBULATORY_CARE_PROVIDER_SITE_OTHER)

## 2024-02-20 VITALS — BP 120/75 | HR 60 | Temp 97.9°F | Ht 62.0 in | Wt 131.1 lb

## 2024-02-20 DIAGNOSIS — I2510 Atherosclerotic heart disease of native coronary artery without angina pectoris: Secondary | ICD-10-CM

## 2024-02-20 DIAGNOSIS — F411 Generalized anxiety disorder: Secondary | ICD-10-CM | POA: Diagnosis not present

## 2024-02-20 DIAGNOSIS — E785 Hyperlipidemia, unspecified: Secondary | ICD-10-CM

## 2024-02-20 DIAGNOSIS — I10 Essential (primary) hypertension: Secondary | ICD-10-CM

## 2024-02-20 DIAGNOSIS — F331 Major depressive disorder, recurrent, moderate: Secondary | ICD-10-CM

## 2024-02-20 DIAGNOSIS — M51372 Other intervertebral disc degeneration, lumbosacral region with discogenic back pain and lower extremity pain: Secondary | ICD-10-CM | POA: Diagnosis not present

## 2024-02-20 MED ORDER — ESCITALOPRAM OXALATE 20 MG PO TABS: 20.0000 mg | ORAL_TABLET | Freq: Every day | ORAL | 2 refills | Status: DC

## 2024-02-20 MED ORDER — BUSPIRONE HCL 5 MG PO TABS: 5.0000 mg | ORAL_TABLET | Freq: Two times a day (BID) | ORAL | 2 refills | Status: DC

## 2024-02-20 MED ORDER — LOSARTAN POTASSIUM 25 MG PO TABS: 25.0000 mg | ORAL_TABLET | Freq: Every evening | ORAL | 3 refills | Status: DC

## 2024-02-20 NOTE — Patient Instructions (Signed)
 VISIT SUMMARY: Today, we reviewed your current health issues and medication management. We discussed your chronic pain, cardiovascular health, recent infections, and mental health. We also ensured your preventive health measures are up to date.  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 -Continue taking hydrocodone  and gabapentin  for pain management until your appointment with physical medicine and rehab is confirmed. -You have been referred to physical medicine and rehab for further pain management.  GENERALIZED ANXIETY DISORDER AND DEPRESSION: You are managing anxiety and depression with Buspar  and Lexapro . -Your Buspar  prescription has been refilled. -Your Lexapro  prescription has been refilled with a 90-day supply and refills.  NEPHROLITHIASIS (KIDNEY STONES): You recently had a severe urinary tract infection and are scheduled for kidney stone removal. -Hold off on taking Flomax  unless you experience kidney stone pain or urinary symptoms.  ESSENTIAL HYPERTENSION: Your high blood pressure is being managed. -Continue taking metoprolol  as prescribed.  HYPERLIPIDEMIA: Your high cholesterol is being managed. -Continue taking rosuvastatin  and Zetia  as prescribed.  GASTROESOPHAGEAL REFLUX DISEASE (GERD): You are managing acid reflux. -Continue taking Protonix  40 mg twice daily.  GENERAL HEALTH MAINTENANCE: You are up to date on your vaccinations and recent screenings. -You have received your flu shot and are up to date on your pneumonia vaccination. -Your recent mammogram was normal.  If you have any problems before your next visit feel free to message me via MyChart (minor issues or questions) or call the office, otherwise you may reach out to schedule an office visit.  Thank you! Saddie Sacks, PA-C

## 2024-02-21 ENCOUNTER — Encounter
Admission: RE | Disposition: A | Discharge status: Home / Self Care | Payer: Self-pay | Source: Home / Self Care | Attending: Internal Medicine

## 2024-02-21 ENCOUNTER — Ambulatory Visit
Admission: RE | Admit: 2024-02-21 | Discharge: 2024-02-21 | Disposition: A | Discharge status: Home / Self Care | Attending: Internal Medicine | Admitting: Internal Medicine

## 2024-02-21 ENCOUNTER — Encounter: Payer: Self-pay | Admitting: Internal Medicine

## 2024-02-21 ENCOUNTER — Other Ambulatory Visit: Payer: Self-pay

## 2024-02-21 DIAGNOSIS — R001 Bradycardia, unspecified: Secondary | ICD-10-CM

## 2024-02-21 DIAGNOSIS — Z951 Presence of aortocoronary bypass graft: Secondary | ICD-10-CM | POA: Insufficient documentation

## 2024-02-21 DIAGNOSIS — I2582 Chronic total occlusion of coronary artery: Secondary | ICD-10-CM | POA: Insufficient documentation

## 2024-02-21 DIAGNOSIS — I25110 Atherosclerotic heart disease of native coronary artery with unstable angina pectoris: Secondary | ICD-10-CM | POA: Insufficient documentation

## 2024-02-21 DIAGNOSIS — Z7902 Long term (current) use of antithrombotics/antiplatelets: Secondary | ICD-10-CM | POA: Diagnosis not present

## 2024-02-21 DIAGNOSIS — I2584 Coronary atherosclerosis due to calcified coronary lesion: Secondary | ICD-10-CM | POA: Insufficient documentation

## 2024-02-21 DIAGNOSIS — R9439 Abnormal result of other cardiovascular function study: Secondary | ICD-10-CM

## 2024-02-21 DIAGNOSIS — F17210 Nicotine dependence, cigarettes, uncomplicated: Secondary | ICD-10-CM | POA: Insufficient documentation

## 2024-02-21 DIAGNOSIS — E782 Mixed hyperlipidemia: Secondary | ICD-10-CM | POA: Diagnosis not present

## 2024-02-21 DIAGNOSIS — I10 Essential (primary) hypertension: Secondary | ICD-10-CM | POA: Insufficient documentation

## 2024-02-21 DIAGNOSIS — Z79899 Other long term (current) drug therapy: Secondary | ICD-10-CM | POA: Insufficient documentation

## 2024-02-21 HISTORY — PX: LEFT HEART CATH AND CORS/GRAFTS ANGIOGRAPHY: CATH118250

## 2024-02-21 SURGERY — LEFT HEART CATH AND CORS/GRAFTS ANGIOGRAPHY
Anesthesia: Moderate Sedation | Laterality: Left

## 2024-02-21 MED ORDER — SODIUM CHLORIDE 0.9% FLUSH
3.0000 mL | Freq: Two times a day (BID) | INTRAVENOUS | Status: DC
  Administered 2024-02-21: 3 mL via INTRAVENOUS

## 2024-02-21 MED ORDER — SODIUM CHLORIDE 0.9% FLUSH: 3.0000 mL | INTRAVENOUS | Status: DC | PRN

## 2024-02-21 MED ORDER — HEPARIN SODIUM (PORCINE) 1000 UNIT/ML IJ SOLN
INTRAMUSCULAR | Status: AC
  Filled 2024-02-21: qty 10

## 2024-02-21 MED ORDER — FREE WATER: 500.0000 mL | Freq: Once | Status: DC

## 2024-02-21 MED ORDER — ASPIRIN 81 MG PO CHEW
81.0000 mg | CHEWABLE_TABLET | ORAL | Status: AC
  Administered 2024-02-21: 81 mg via ORAL

## 2024-02-21 MED ORDER — HEPARIN (PORCINE) IN NACL 1000-0.9 UT/500ML-% IV SOLN
INTRAVENOUS | Status: AC
  Filled 2024-02-21: qty 1000

## 2024-02-21 MED ORDER — IOHEXOL 300 MG/ML  SOLN
INTRAMUSCULAR | Status: DC | PRN
Start: 2024-02-21 — End: 2024-02-21
  Administered 2024-02-21: 169 mL

## 2024-02-21 MED ORDER — MIDAZOLAM HCL 2 MG/2ML IJ SOLN
INTRAMUSCULAR | Status: AC
  Filled 2024-02-21: qty 2

## 2024-02-21 MED ORDER — HEPARIN (PORCINE) IN NACL 1000-0.9 UT/500ML-% IV SOLN
INTRAVENOUS | Status: DC | PRN
Start: 2024-02-21 — End: 2024-02-21
  Administered 2024-02-21: 1000 mL

## 2024-02-21 MED ORDER — SODIUM CHLORIDE 0.9% FLUSH: 3.0000 mL | Freq: Two times a day (BID) | INTRAVENOUS | Status: DC

## 2024-02-21 MED ORDER — HYDRALAZINE HCL 20 MG/ML IJ SOLN: 10.0000 mg | INTRAMUSCULAR | Status: DC | PRN

## 2024-02-21 MED ORDER — ASPIRIN 81 MG PO CHEW
CHEWABLE_TABLET | ORAL | Status: AC
Start: 2024-02-21 — End: 2024-02-21
  Filled 2024-02-21: qty 1

## 2024-02-21 MED ORDER — FREE WATER
500.0000 mL | Freq: Once | Status: AC
  Administered 2024-02-21: 500 mL via ORAL

## 2024-02-21 MED ORDER — HEPARIN SODIUM (PORCINE) 1000 UNIT/ML IJ SOLN
INTRAMUSCULAR | Status: DC | PRN
  Administered 2024-02-21: 2500 [IU] via INTRAVENOUS

## 2024-02-21 MED ORDER — ONDANSETRON HCL 4 MG/2ML IJ SOLN: 4.0000 mg | Freq: Four times a day (QID) | INTRAMUSCULAR | Status: DC | PRN

## 2024-02-21 MED ORDER — SODIUM CHLORIDE 0.9 % IV SOLN: 250.0000 mL | INTRAVENOUS | Status: DC | PRN

## 2024-02-21 MED ORDER — FENTANYL CITRATE (PF) 100 MCG/2ML IJ SOLN
INTRAMUSCULAR | Status: DC | PRN
  Administered 2024-02-21 (×2): 25 ug via INTRAVENOUS

## 2024-02-21 MED ORDER — ACETAMINOPHEN 325 MG PO TABS
650.0000 mg | ORAL_TABLET | ORAL | Status: DC | PRN
Start: 2024-02-21 — End: 2024-02-21
  Administered 2024-02-21: 650 mg via ORAL

## 2024-02-21 MED ORDER — LIDOCAINE HCL (PF) 1 % IJ SOLN
INTRAMUSCULAR | Status: DC | PRN
  Administered 2024-02-21: 2 mL

## 2024-02-21 MED ORDER — LIDOCAINE HCL 1 % IJ SOLN
INTRAMUSCULAR | Status: AC
  Filled 2024-02-21: qty 20

## 2024-02-21 MED ORDER — VERAPAMIL HCL 2.5 MG/ML IV SOLN
INTRAVENOUS | Status: AC
Start: 2024-02-21 — End: 2024-02-21
  Filled 2024-02-21: qty 2

## 2024-02-21 MED ORDER — MIDAZOLAM HCL (PF) 2 MG/2ML IJ SOLN
INTRAMUSCULAR | Status: DC | PRN
Start: 2024-02-21 — End: 2024-02-21
  Administered 2024-02-21 (×2): 1 mg via INTRAVENOUS

## 2024-02-21 MED ORDER — FENTANYL CITRATE (PF) 100 MCG/2ML IJ SOLN
INTRAMUSCULAR | Status: AC
  Filled 2024-02-21: qty 2

## 2024-02-21 MED ORDER — VERAPAMIL HCL 2.5 MG/ML IV SOLN
INTRAVENOUS | Status: DC | PRN
  Administered 2024-02-21 (×2): 2.5 mg via INTRACORONARY

## 2024-02-21 MED ORDER — ACETAMINOPHEN 325 MG PO TABS
ORAL_TABLET | ORAL | Status: AC
  Filled 2024-02-21: qty 2

## 2024-02-21 MED ORDER — SODIUM CHLORIDE 0.9 % IV SOLN
250.0000 mL | INTRAVENOUS | Status: DC | PRN
  Administered 2024-02-21: 250 mL via INTRAVENOUS

## 2024-02-21 SURGICAL SUPPLY — 12 items
CATH INFINITI 5 FR LCB (CATHETERS) IMPLANT
CATH INFINITI 5 FR RCB (CATHETERS) IMPLANT
CATH INFINITI 5FR JL4 (CATHETERS) IMPLANT
CATH INFINITI JR4 5F (CATHETERS) IMPLANT
DEVICE RAD TR BAND REGULAR (VASCULAR PRODUCTS) IMPLANT
DRAPE BRACHIAL (DRAPES) IMPLANT
GLIDESHEATH SLEND SS 6F .021 (SHEATH) IMPLANT
GUIDEWIRE INQWIRE 1.5J.035X260 (WIRE) IMPLANT
PACK CARDIAC CATH (CUSTOM PROCEDURE TRAY) ×1 IMPLANT
SET ATX-X65L (MISCELLANEOUS) IMPLANT
STATION PROTECTION PRESSURIZED (MISCELLANEOUS) IMPLANT
WIRE HITORQ VERSACORE ST 145CM (WIRE) IMPLANT

## 2024-02-21 NOTE — Progress Notes (Signed)
 Established Patient Office Visit  Subjective   Patient ID: Regina Perry, female    DOB: 04-09-1960  Age: 63 y.o. MRN: [id number]  Chief Complaint  Patient presents with   Medication Management    HPI  History of Present Illness   Regina Perry is a 63 year old female with coronary artery disease and chronic pain who presents for medication management and follow-up on recent health issues.  Cardiovascular symptoms and management - Coronary artery disease with history of open-heart surgery - Under care of cardiologist (Dr. 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 Is requesting a referral to a new pain management clinic.   Genitourinary symptoms  - Severe urinary tract infection earlier this month, required two different antibiotics after initial emergency room visit - Completed antibiotic course - Developed yeast infection after antibiotics, treated with Diflucan  - No ongoing urinary symptoms - No longer taking Flomax , which was initially prescribed for kidney stones - Scheduled for kidney stone removal on March 14, 2024  Psychiatric symptoms and management - Current medications include Buspar  and Lexapro  20 mg - Buspar  is effective for her symptoms and she is pleased with the effects from this after starting at last OV.  - Denies side effects   Preventive health and gynecologic care - Recent flu shot and up to date on pneumonia vaccination - Recent normal mammogram - Gynecologic care  managed by Dr. Bobbette Brunswick        ROS Per HPI.    Objective:     BP 120/75   Pulse 60   Temp 97.9 F (36.6 C) (Oral)   Ht 5' 2 (1.575 m)   Wt 131 lb 1.3 oz (59.5 kg)   SpO2 98%   BMI 23.97 kg/m    Physical Exam Constitutional:      General: She is not in acute distress.    Appearance: Normal appearance.  Cardiovascular:     Rate and Rhythm: Normal rate and regular rhythm.     Heart sounds: Normal heart sounds. No murmur heard.    No friction rub. No gallop.  Pulmonary:     Effort: Pulmonary effort is normal. No respiratory distress.     Breath sounds: Normal breath sounds.  Musculoskeletal:        General: No swelling.  Skin:    General: Skin is warm and dry.  Neurological:     General: No focal deficit present.     Mental Status: She is alert.  Psychiatric:        Mood and Affect: Mood normal.        Behavior: Behavior normal.        Thought Content: Thought content normal.      No results found for any visits on 02/20/24.    The 10-year ASCVD risk score (Arnett DK, et al., 2019) is: 6.4%    Assessment & Plan:   Degeneration of intervertebral disc of lumbosacral region with discogenic back pain and lower extremity pain Assessment & Plan: Patient follows with pain management and is currently being  prescribed Norco through them. Patient is requesting a referral to a different pain management clinic as she is not pleased with the treatment she receives at  [facility]. In July, I referred her to [facility] but referral was closed because they were unable to get into contact with her. Would now like new referral to PM&R in [place]. Referral placed.  Orders: -     Ambulatory referral to Physical Medicine Rehab  Primary hypertension Assessment & Plan: BP goal <130/80. BP at goal today in office. Continue losartan  25 mg and Metoprolol  XL 25 mg daily. Home readings generally within target range. - Monitor blood pressure at home.  Orders: -      Losartan  Potassium; Take 1 tablet (25 mg total) by mouth every evening.  Dispense: 90 tablet; Refill: 3  Moderate episode of recurrent major depressive disorder (HCC) Assessment & Plan: Symptoms have improved greatly on Buspar  and Lexapro  20 mg daily. Continue these medications. Will cont to monitor.  Orders: -     Escitalopram  Oxalate; Take 1 tablet (20 mg total) by mouth at bedtime.  Dispense: 90 tablet; Refill: 2  Generalized anxiety disorder Assessment & Plan: Much improved with addition of Buspar  5 mg BID to pre-existing Lexapro  20 mg daily. Will cont to monitor.   Orders: -     Escitalopram  Oxalate; Take 1 tablet (20 mg total) by mouth at bedtime.  Dispense: 90 tablet; Refill: 2  Hyperlipidemia, unspecified hyperlipidemia type Assessment & Plan: Last lipid panel: LDL, 56, HDL 60, Trig 135. The 10-year ASCVD risk score (Arnett DK, et al., 2019) is: 7.5% Continue rosuvastatin  40 mg daily and ezitimibe 10 mg daily.   Atherosclerosis of native coronary artery of native heart without angina pectoris Assessment & Plan: Scheduled for heart catheterization on 02/21/24.    Other orders -     busPIRone  HCl; Take 1 tablet (5 mg total) by mouth 2 (two) times daily.  Dispense: 180 tablet; Refill: 2     Return in about 4 months (around 06/19/2024).    Regina JULIANNA Sacks, PA-C

## 2024-02-21 NOTE — Assessment & Plan Note (Signed)
 Much improved with addition of Buspar  5 mg BID to pre-existing Lexapro  20 mg daily. Will cont to monitor.

## 2024-02-21 NOTE — Assessment & Plan Note (Signed)
 BP goal <130/80. BP at goal today in office. Continue losartan  25 mg and Metoprolol  XL 25 mg daily. Home readings generally within target range. - Monitor blood pressure at home.

## 2024-02-21 NOTE — Assessment & Plan Note (Signed)
 Patient follows with pain management and is currently being prescribed Norco through them. Patient is requesting a referral to a different pain management clinic as she is not pleased with the treatment she receives at  [facility]. In July, I referred her to [facility] but referral was closed because they were unable to get into contact with her. Would now like new referral to PM&R in [place]. Referral placed.

## 2024-02-21 NOTE — Assessment & Plan Note (Signed)
 Symptoms have improved greatly on Buspar  and Lexapro  20 mg daily. Continue these medications. Will cont to monitor.

## 2024-02-21 NOTE — Assessment & Plan Note (Signed)
 Scheduled for heart catheterization on 02/21/24.

## 2024-02-21 NOTE — Assessment & Plan Note (Signed)
 Last lipid panel: LDL, 56, HDL 60, Trig 135. The 10-year ASCVD risk score (Arnett DK, et al., 2019) is: 7.5% Continue rosuvastatin  40 mg daily and ezitimibe 10 mg daily.

## 2024-02-23 LAB — CARDIAC CATHETERIZATION: Cath EF Quantitative: 50 %

## 2024-02-26 ENCOUNTER — Encounter: Payer: Self-pay | Admitting: Internal Medicine

## 2024-02-27 ENCOUNTER — Telehealth: Payer: Self-pay

## 2024-02-27 NOTE — Progress Notes (Signed)
  Phone Number: [phone] for Surgical Coordinator Fax Number: [phone]  REQUEST FOR SURGICAL CLEARANCE      Date: Date: 02/27/24  Faxed to: Dr. Dewane    Surgeon: Dr. Glendia Barba, MD     Date of Surgery: 03/14/2024  Operation: Left Ureteroscopy with Laser Lithotripsy and Stent Placement   Anesthesia Type: General   Diagnosis: Left Nephrolithiasis  Patient Requires:   Cardiac / Vascular Clearance : Yes  Reason: Would like for patient to hold Plavix  prior to surgery- How many days if able?    Risk Assessment:    Low   []       Moderate   []     High   []           This patient is optimized for surgery  YES []       NO   []    I recommend further assessment/workup prior to surgery. YES []      NO  []   Appointment scheduled for: _______________________   Further recommendations: ____________________________________     Physician Signature:__________________________________   Printed Name: ________________________________________   Date: _________________

## 2024-02-27 NOTE — Telephone Encounter (Signed)
 Per Dr. Twylla, Patient is to be scheduled for Left Ureteroscopy with Laser Lithotripsy and Stent Placement   Mrs. Perry was contacted and possible surgical dates were discussed, Thursday December 18th, 2025 was agreed upon for surgery.   Patient was instructed that Dr. Twylla will require them to provide a pre-op UA & CX prior to surgery. This was ordered and scheduled drop off appointment was made for 03/04/2024.    Patient was directed to call [phone] between 1-3pm the day before surgery to find out surgical arrival time.  Instructions were given not to eat or drink from midnight on the night before surgery and have a driver for the day of surgery. On the surgery day patient was instructed to enter through the Medical Mall entrance of [facility] report the Same Day Surgery desk.   Pre-Admit Testing will be in contact via phone to set up an interview with the anesthesia team to review your history and medications prior to surgery.   Reminder of this information was sent via MyChart to the patient.

## 2024-02-27 NOTE — Progress Notes (Signed)
   [facility] Surgical Posting Form  Surgery Date: Date: 03/14/2024  Surgeon: Dr. Glendia Barba, MD  Inpt ( No  )   Outpt (Yes)   Obs ( No  )   Diagnosis: N20.0 Left Nephrolithiasis  -CPT: [id number]  Surgery: Left Ureteroscopy with Laser Lithotripsy and Stent Placement  Stop Anticoagulations: Yes- may need to hold Plavix , will check with Cards  Cardiac/Medical/Pulmonary Clearance needed: Yes  Clearance needed from Dr: Dewane  Clearance request sent on: Date: 02/27/24  *Orders entered into EPIC  Date: 02/27/24   *Case booked in EPIC  Date: 02/27/24  *Notified pt of Surgery: Date: 02/27/24  PRE-OP UA & CX: yes, will obtain in clinic on 03/04/2024  *Placed into Prior Authorization Work [place] Date: 02/27/24  Assistant/laser/rep:No

## 2024-03-02 ENCOUNTER — Other Ambulatory Visit: Payer: Self-pay | Admitting: Cardiology

## 2024-03-02 DIAGNOSIS — I2510 Atherosclerotic heart disease of native coronary artery without angina pectoris: Secondary | ICD-10-CM

## 2024-03-02 DIAGNOSIS — Z951 Presence of aortocoronary bypass graft: Secondary | ICD-10-CM

## 2024-03-04 ENCOUNTER — Other Ambulatory Visit

## 2024-03-06 ENCOUNTER — Inpatient Hospital Stay: Admission: RE | Admit: 2024-03-06 | Discharge: 2024-03-06 | Attending: Urology | Admitting: Urology

## 2024-03-06 ENCOUNTER — Other Ambulatory Visit: Payer: Self-pay

## 2024-03-06 ENCOUNTER — Other Ambulatory Visit

## 2024-03-06 DIAGNOSIS — N200 Calculus of kidney: Secondary | ICD-10-CM

## 2024-03-06 LAB — MICROSCOPIC EXAMINATION: Epithelial Cells (non renal): >10 /HPF — AB (ref 0–10)

## 2024-03-06 LAB — URINALYSIS, COMPLETE
Bilirubin, UA: NEGATIVE
Glucose, UA: NEGATIVE
Ketones, UA: NEGATIVE
Leukocytes,UA: NEGATIVE
Nitrite, UA: NEGATIVE
Protein,UA: NEGATIVE
Specific Gravity, UA: 1.015 (ref 1.005–1.030)
Urobilinogen, Ur: 0.2 mg/dL (ref 0.2–1.0)
pH, UA: 6 (ref 5.0–7.5)

## 2024-03-06 NOTE — Progress Notes (Signed)
 Chief Complaint  Patient presents with   Chest Pain   Follow-up   Dizziness    Subjective  Regina Perry is a 63 y.o. female who presents for Chest Pain, Follow-up, and Dizziness Chest Pain  Associated symptoms include shortness of breath.  Shortness of Breath Associated symptoms include chest pain.   History of Present Illness Regina Perry is a 63 year old female with coronary artery disease who presents with chest pressure and throat tightness. She was referred by her previous cardiologist for further evaluation and potential intervention at [facility].  She has been experiencing chest pressure for the past three days, which radiates upwards and causes a sensation of throat tightness. She has a history of coronary artery disease and recently underwent a cardiac catheterization that did not result in any intervention.  She has a very deep cough, which is particularly severe at night. There are no associated symptoms such as fever or sputum production.  She has a history of smoking but has recently reduced her consumption to less than a pack a day, down from two packs a day previously. She wants to quit smoking entirely.  She is currently taking Plavix . There was a previous authorization to stop Ferrex five days before a procedure, but this was before her recent cardiac issues were identified.  Review of Systems  Respiratory:  Positive for shortness of breath.   Cardiovascular:  Positive for chest pain.    Patient Active Problem List  Diagnosis   Bloody discharge from right nipple   Cigarette nicotine  dependence with nicotine -induced disorder- 46 pack year history   Unstable angina (CMS/HHS-HCC)   Trigger thumb, left thumb   Restless leg syndrome   Primary insomnia   Perennial allergic rhinitis   Moderate episode of recurrent major depressive disorder (CMS-HCC)   Lumbar stenosis with neurogenic claudication   Left sided sciatica   Hypertension   Hyperlipidemia   Heart  palpitations   Generalized anxiety disorder   Ganglion cyst of volar aspect of left wrist   Estrogen deficiency   DDD (degenerative disc disease), lumbosacral   Coronary artery disease involving coronary bypass graft of native heart without angina pectoris   Atherosclerosis of native coronary artery of native heart without angina pectoris   Chronic migraine without aura without status migrainosus, not intractable   Atherosclerosis of both carotid arteries   History of loop electrical excision procedure (LEEP)    Outpatient Medications Prior to Visit  Medication Sig Dispense Refill   acetaminophen  (TYLENOL ) 500 MG tablet Take by mouth     busPIRone  (BUSPAR ) 5 MG tablet Take 5 mg by mouth 2 (two) times daily     clopidogreL  (PLAVIX ) 75 mg tablet Take by mouth     escitalopram  oxalate (LEXAPRO ) 20 MG tablet Take 20 mg by mouth once daily     ezetimibe  (ZETIA ) 10 mg tablet Take 1 tablet by mouth once daily     fluticasone propionate (FLONASE) 50 mcg/actuation nasal spray Place 2 sprays into both nostrils once daily     gabapentin  (NEURONTIN ) 300 MG capsule 300 mg 3 (three) times daily     HYDROcodone -acetaminophen  (NORCO) 10-325 mg tablet Take 1 tablet by mouth every 6 (six) hours as needed     loratadine  (CLARITIN ) 10 mg tablet Take 1 tablet by mouth once daily     LORazepam (ATIVAN) 0.5 MG tablet Take 0.5 mg by mouth 3 (three) times daily as needed     losartan  (COZAAR ) 25 MG tablet Take 25 mg by mouth  once daily     metoprolol  succinate (TOPROL -XL) 25 MG XL tablet TAKE 1 TAB IN THE MORNING,HOLD IF SYSTOLIC BLOOD PRESSURE(TOP NUMBER) LESS THAN OR HEART RATE LESS THAN 60BPM (PULSE     pantoprazole  (PROTONIX ) 40 MG DR tablet Take 1 tablet (40 mg total) by mouth 2 (two) times daily before meals 180 tablet 3   rosuvastatin  (CRESTOR ) 40 MG tablet Take 40 mg by mouth once daily     SUMAtriptan  (IMITREX ) 100 MG tablet Take by mouth     tamsulosin  (FLOMAX ) 0.4 mg  capsule Take 0.4 mg by mouth once daily     estradioL (ESTRACE) 0.01 % (0.1 mg/gram) vaginal cream Place 2 g vaginally once daily (Patient not taking: Reported on 03/01/2024) 42.5 g 11   estradioL (ESTRING) 2 mg (7.5 mcg /24 hour) vaginal ring Place 1 Ring (2 mg total) vaginally every 3 (three) months for 180 days (Patient not taking: Reported on 03/01/2024) 1 Ring 1   No facility-administered medications prior to visit.      Objective  Vitals:   03/06/24 1115 03/06/24 1118 03/06/24 1119  BP: 130/80 120/80 120/80  Pulse: 51 52 (!) 49  SpO2: 95% 96% 94%  Weight: 60.8 kg (134 lb)    Height: 158.8 cm (5' 2.5)    PainSc:   7    PainLoc: Chest       Body mass index is 24.12 kg/m.  Home Vitals:     Physical Exam Physical Exam  Constitutional: alert, in NAD, and communicates well Eye exam: pupils equal and reactive, extraocular eye movements intact. Neck: supple, no thyroid enlargement or cervical adenopathy, and no bruits heard Respiratory: clear to auscultation, without rales or wheezes  Cardiovascular: regular rate and rhythm and without murmurs, rubs or gallops Lower extremities: no lower extremity edema Skin ankles/feet: warm, good capillary refill and no ulcerations or lesions noted Neurological: sensorimotor grossly intact and normal muscle tone  Results RADIOLOGY Lung CT: Calcification around the heart Carotid artery ultrasound: Right 40-59% stenosis, Left 40-59% stenosis Abdominal aorta ultrasound: Slight dilation, stable since 2023   DIAGNOSTIC Echocardiogram: Ejection fraction 53%, diastolic dysfunction noted, no fluid retention Holter Monitor: Occasional premature beats, transient tachycardia for 17 beats   DIAGNOSTIC Nuclear stress test: Slightly abnormal, indicating possible small coronary artery blockage.      Holter monitor from 11/29/2023 to 12/14/2023: Predominant rhythm was sinus. Max heart rate 154, minimum 37, average 55. VE burden less than  1%.  2 occurrences of VT with the longest lasting 7 beats. Less than 1% SVE burden with 23 occurrences of SVT the fastest at 154 and longest lasting 17 beats. There were 2 patient triggers which were unassociated.   NUC IMPRESSION: Pharmacological stress test is abnormal. EKG nondiagnostic due to baseline ST-T changes. SPECT images demonstrate small area of mild ischemia in apical anterior wall and true apex, sum difference score 2. Left ventricle normal in size with LVEF of 58%.  No significant TID. Low risk study.   Assessment/Plan:   Assessment & Plan Essential hypertension Blood pressure is currently well-controlled on antihypertensive therapy.  Mixed hyperlipidemia Continues on Crestor  for cholesterol management.  Bilateral carotid artery stenosis, 40-59% Bilateral carotid artery stenosis with 40-59% occlusion, requiring regular monitoring for progression. - Continue annual carotid ultrasound to monitor stenosis.  Stable abdominal aortic aneurysm Abdominal aortic aneurysm is slightly dilated but stable since 2023. - Continue monitoring of abdominal aortic aneurysm.  Bradycardia Reports fatigue and low energy levels, potentially related to bradycardia with  heart rate around 50-55 bpm. Currently on metoprolol , which may contribute to bradycardia. - Refer to heart rhythm specialist for further evaluation of bradycardia  Palpitations Reports occasional palpitations, described as a sensation of the heart flipping when lying on the left side. Holter monitor showed occasional tachycardia episodes lasting up to 10 seconds, possibly occurring during sleep. No sleep apnea diagnosed in previous sleep study. - Refer to heart rhythm specialist for further evaluation of palpitations  Unstable angina in the setting of coronary artery disease status post coronary artery bypass graft Intermittent chest pressure for three days with sensation of throat constriction. Recent catheterization  showed disease, but no intervention was performed. Symptoms suggest unstable angina. Previous CABG with new blockage likely due to vein graft disease. Genetic predisposition to coronary artery disease noted. Risks of stopping Plavix  discussed, emphasizing the danger of holding it given current symptoms and recent catheterization findings. Minimal bleed risk associated with kidney stone retrieval discussed, suggesting continuation of Plavix  is safe. - Continue Plavix  without interruption. - Referred to [facility] for heart catheterization with Dr. Katina to identify and address the lesion causing symptoms. - Coordinated with gastroenterology to ensure continuation of Plavix  during kidney stone retrieval procedure. Diagnoses and all orders for this visit:  Unstable angina (CMS/HHS-HCC) -     ECG 12-lead  Heart palpitations  Primary hypertension  Bradycardia  Coronary artery disease involving coronary bypass graft of native heart without angina pectoris  Mixed hyperlipidemia  Hx of CABG  Other orders -     ECG      Future Appointments     Date/Time Provider Department Center Visit Type   04/10/2024 2:15 PM Custovic, Annalee, DO [facility] FOLLOW UP   04/23/2024 2:15 PM Schermerhorn, Demetra Vaporis, MD [facility] C GYN RETURN       There are no Patient Instructions on file for this visit.  An after visit summary was provided for the patient either in written format (printed) or through My [facility].  This note has been created using automated tools and reviewed for accuracy by [facility] C[facility].

## 2024-03-06 NOTE — Patient Instructions (Addendum)
 Your procedure is scheduled on:   THURSDAY DECEMBER 18  Report to the Registration Desk on the 1st floor of the [facility] To find out your arrival time, please call [phone] between 1PM - 3PM on:  [facility] DECEMBER 17  If your arrival time is 6:00 am, do not arrive before that time as the Medical Mall entrance doors do not open until 6:00 am.  REMEMBER: Instructions that are not followed completely may result in serious medical risk, up to and including death; or upon the discretion of your surgeon and anesthesiologist your surgery may need to be rescheduled.  Do not eat food after midnight the night before surgery.  No gum chewing or hard candies.   One week prior to surgery: Stop Anti-inflammatories (NSAIDS) such as Advil , Aleve , Ibuprofen , Motrin , Naproxen , Naprosyn  and Aspirin  based products such as Excedrin, Goody's Powder, BC Powder. Stop ANY OVER THE COUNTER supplements until after surgery. fluticasone (FLONASE)  loratadine  (CLARITIN )  You may however, continue to take Tylenol  if needed for pain up until the day of surgery.  **Follow recommendations regarding stopping blood thinners.** clopidogrel  (PLAVIX ) hold 5 days prior to surgery, last dose FRIDAY DECEMBER 12   Continue taking all of your other prescription medications up until the day of surgery.  ON THE DAY OF SURGERY ONLY TAKE THESE MEDICATIONS WITH SIPS OF WATER :  busPIRone  (BUSPAR )  metoprolol  succinate (TOPROL -XL)  pantoprazole  (PROTONIX )  gabapentin  (NEURONTIN )   No Alcohol for 24 hours before or after surgery.  No Smoking including e-cigarettes for 24 hours before surgery.   Do not use any recreational drugs for at least a week (preferably 2 weeks) before your surgery.  Please be advised that the combination of cocaine and anesthesia may have negative outcomes, up to and including death. If you test positive for cocaine, your surgery will be cancelled.  On the morning of surgery brush your teeth  with toothpaste and water , you may rinse your mouth with mouthwash if you wish. Do not swallow any toothpaste or mouthwash.  Do not wear jewelry, make-up, hairpins, clips or nail polish.  For welded (permanent) jewelry: bracelets, anklets, waist bands, etc.  Please have this removed prior to surgery.  If it is not removed, there is a chance that hospital personnel will need to cut it off on the day of surgery.  Do not wear lotions, powders, or perfumes.   Do not shave body hair from the neck down 48 hours before surgery.  Do not bring valuables to the hospital. [facility] is not responsible for any missing/lost belongings or valuables.  Notify your doctor if there is any change in your medical condition (cold, fever, infection).  Wear comfortable clothing (specific to your surgery type) to the hospital.  After surgery, you can help prevent lung complications by doing breathing exercises.  Take deep breaths and cough every 1-2 hours.   If you are being discharged the day of surgery, you will not be allowed to drive home. You will need a responsible individual to drive you home and stay with you for 24 hours after surgery.   If you are taking public transportation, you will need to have a responsible individual with you.  Please call the Pre-admissions Testing Dept. at [phone] if you have any questions about these instructions.  Surgery Visitation Policy:  Patients having surgery or a procedure may have two visitors.  Children under the age of 51 must have an adult with them who is not the patient.  Merchandiser, Retail to address health-related social needs:  [email or web address]

## 2024-03-08 ENCOUNTER — Telehealth: Payer: Self-pay | Admitting: Cardiology

## 2024-03-08 ENCOUNTER — Telehealth: Payer: Self-pay

## 2024-03-08 ENCOUNTER — Encounter: Payer: Self-pay | Admitting: Urology

## 2024-03-08 NOTE — Progress Notes (Signed)
 Perioperative / Anesthesia Services  Pre-Admission Testing Clinical Review / Pre-Operative Anesthesia Consult  Date: 03/08/2024  PATIENT DEMOGRAPHICS: Name: Regina Perry DOB: [birth year] MRN:   [id number]  Note: Available PAT nursing documentation and vital signs have been reviewed. Clinical nursing staff has updated patient's PMH/PSHx, current medication list, and drug allergies/intolerances to ensure complete and comprehensive history available to assist care teams in MDM as it pertains to the aforementioned surgical procedure and anticipated anesthetic course. Extensive review of available clinical information personally performed. Nursing documentation reviewed. New Site PMH and PSHx updated with any diagnoses and/or procedures that I have knowledge of that may have been inadvertently omitted during her intake with the pre-admission testing department's nursing staff.  PLANNED SURGICAL PROCEDURE(S):   Case: [id number] Date/Time: 03/14/24 1120   Procedure: CYSTOSCOPY/URETEROSCOPY/HOLMIUM LASER/STENT PLACEMENT (Left)   Anesthesia type: General   Diagnosis: Left nephrolithiasis [N20.0]   Pre-op diagnosis: Left Nephrolithiasis   Location: [facility] OR ROOM 10 / [facility]S FOR ANEST[facility]ESIA [facility]   Surgeons: Twylla Glendia BROCKS, MD        CLINICAL DISCUSSION: Regina Perry is a 63 y.o. female who is submitted for pre-surgical anesthesia review and clearance prior to her undergoing the above procedure. Patient is a Current Smoker (36.4 pack years). Pertinent PMH includes: CAD (s/p CABG), diastolic dysfunction, NSVT, AAA, [place] , BILATERAL carotid artery disease, aortic atherosclerosis, HTN, HLD, SOB, COPD, GERD (on daily PPI), nephrolithiasis, lumbosacral DDD, lumbar stenosis with neurogenic claudication, depression, anxiety (on BZO), insomnia.  Patient is followed by cardiology Juanice, MD). She was last seen in the cardiology clinic on 03/06/2024; notes reviewed. At the time of her clinic visit,  patient complaining of a several day history of chest pressure that radiated upwards causing a sensation of tightness in her throat. Patient denied any shortness of breath, PND, orthopnea, palpitations, significant peripheral edema, weakness, fatigue, vertiginous symptoms, or presyncope/syncope. Patient with a past medical history significant for cardiovascular diagnoses. Documented physical exam was grossly benign, providing no evidence of acute exacerbation and/or decompensation of the patient's known cardiovascular conditions.  Coronary CTA was performed on 12/25/2018 that demonstrated an Agatston coronary artery calcium  score of 250. This placed patient in the 42 percentile for age, sex, and race matched controls. Calcium  depositions noted to be isolated mainly in the ostial LM and LAD distributions.  Study demonstrated normal coronary origin with all's right dominance.  Pulmonary artery and aorta noted to be of normal size/caliber with no evidence of ectasia.  Study sent for FFR analysis (ranges: < 0.75 high likelihood of hemodynamically significant stenosis, 0.76-0.80 borderline, > 0.80 normal):  Left Main: Short.  Unable to obtain FFRct within the LM.  LAD: FFRct 0.77 proximal, 0.70 mid just after the proximal LAD stenosis, 0.56 mid LAD. When the left main stenosis is opened the proximal LAD FFRct is normal, but the more distal abnormalities persists.  LCX: FFRct 0.86 proximal, mid 0.78, distal 0.74  RCA: FFRct 0.9 proximally, 0.86 mid  Patient underwent diagnostic LEFT heart catheterization on 01/08/2019 revealing a 90% stenosis of the mid LM and a 60% focal stenosis of the mid LAD.  LVEDP mildly elevated at 21 mmHg.   Patient underwent revascularization on 01/09/2019.  LIMA-LAD and SVG-OM1 bypass graft were placed.  Most recent TTE was performed on 03/01/2022 revealing a normal left ventricular systolic function with an EF of 60-65%.  There were no regional wall motion abnormalities.  Left  ventricular diastolic Doppler parameters were normal.  LAP was normal.  There was mild  tricuspid and pulmonary valve regurgitation.  Abdominal aorta mildly dilated at 30 mm with evidence of atherosclerosis.  Most recent myocardial perfusion imaging study was performed on 08/04/2022 revealing a  normal left ventricular systolic function with an EF of 60%.  There were no regional wall motion abnormalities.  No artifact or left ventricular cavity size enlargement appreciated on review of imaging. SPECT images demonstrated no evidence of stress-induced myocardial ischemia or arrhythmia; no scintigraphic evidence of scar.  Study determined to be normal and low risk.  Patient with a history of known carotid artery disease.  Most recent carotid Doppler study performed on 10/06/2023 revealed a 40-59% stenosis of the patient's BILATERAL carotid arteries.  Vertebral arteries demonstrated antegrade flow.  There were normal flow hemodynamics seen in the subclavian arteries.  Patient with known aneurysmal dilatation of the abdominal aorta.  Most recent imaging was performed on 10/06/2023, at which time the aneurysmal defect was measured up to 3.3 cm in the largest transverse dimension.  Patient underwent repeat diagnostic LEFT heart catheterization on 02/21/2024.  Study revealed a low normal left ventricular systolic function with an EF of 50-55%.  There was inferior basal hypokinesis.  Multivessel CAD noted: 90% mid LM, 60% mid LAD, and 95% ostial RCA.  SVG-OM1 occluded at the origin.  LIMA-LAD bypass graft was noted to be widely patent.  No further intervention attempted at that time.  Case being reviewed by tertiary care facility.  Following cardiac revascularization, patient remains on daily antithrombotic therapy using clopidogrel .  Patient is reportedly compliant with therapy with no evidence or reports of GI/GU related bleeding. Blood pressure reasonably controlled at 130/80 mmHg on currently prescribed ARB  (losartan ) and beta-blocker (metoprolol  succinate) therapies.  Patient is on rosuvastatin  + ezetimibe  for her HLD diagnosis and ASCVD prevention. Patient is not diabetic. She does not have an OSAH diagnosis. Patient is able to complete all of her  ADL/IADLs without cardiovascular limitation.  Per the [facility]ASI, patient is able to achieve at least 4 METS of physical activity without experiencing any significant degree of angina/anginal equivalent symptoms. No changes were made to her medication regimen during her visit with cardiology.  Patient scheduled to follow-up with outpatient cardiology in 3 months or sooner if needed.  Regina Perry is scheduled for an elective CYSTOSCOPY/URETEROSCOPY/HOLMIUM LASER/STENT PLACEMENT (Left) on 03/14/2024 with Dr. Glendia JAYSON Barba, MD. Given patient's past medical history significant for cardiovascular diagnoses, presurgical cardiac clearance was sought by the PAT team. Per cardiology, this patient is optimized for surgery and may proceed with the planned procedural course with a MODERATE risk of significant perioperative cardiovascular complications.  Again, this patient is on daily clopidogrel  therapy.  She has been instructed on recommendations from her cardiologist for holding her clopidogrel  for 5 days prior to her procedure with plans to restart as soon as postoperative bleeding was felt to be minimized by her primary attending surgeon.  Patient is aware that her last dose of clopidogrel  should be on 03/08/2024.  Patient reports previous perioperative complications with anesthesia in the past. Patient has a PMH (+) for PONV. Symptoms and history of PONV will be discussed with patient by anesthesia team on the day of her procedure. Interventions will be ordered as deemed necessary based on patient's individual care needs as determined by anesthesiologist. In review her EMR, it is noted that patient underwent a general anesthetic course here at [facility] (ASA III) in 06/2022 without documented complications.   MOST RECENT VITAL SIGNS:  03/06/2024    4:09 PM 02/21/2024    2:45 PM 02/21/2024    2:30 PM  Vitals with BMI  Height 5' 2.5    Weight 134 lbs    BMI 24.1    Systolic  109 116  Diastolic  53 60  Pulse  51 52   PROVIDERS/SPECIALISTS: NOTE: Primary physician provider listed below. Patient may have been seen by APP or partner within same practice.   PROVIDER ROLE / SPECIALTY [facility]ST [place] Twylla Glendia JAYSON, MD Urology (Surgeon) 02/06/2024  Gayle Saddie FALCON, [place] Primary Care Provider 02/20/2024  Dewane Shiner, DO Cardiology 03/07/2024   ALLERGIES: Allergies[1]  CURRENT HOME MEDICATIONS:  acetaminophen  (TYLENOL ) 500 MG tablet   busPIRone  (BUSPAR ) 5 MG tablet   clopidogrel  (PLAVIX ) 75 MG tablet   escitalopram  (LEXAPRO ) 20 MG tablet   ezetimibe  (ZETIA ) 10 MG tablet   fluticasone (FLONASE) 50 MCG/ACT nasal spray   gabapentin  (NEURONTIN ) 300 MG capsule   HYDROcodone -acetaminophen  (NORCO) 10-325 MG tablet   loratadine  (CLARITIN ) 10 MG tablet   LORazepam  (ATIVAN ) 0.5 MG tablet   losartan  (COZAAR ) 25 MG tablet   metoprolol  succinate (TOPROL -XL) 25 MG 24 hr tablet   pantoprazole  (PROTONIX ) 40 MG tablet   rosuvastatin  (CRESTOR ) 40 MG tablet   SUMAtriptan  (IMITREX ) 100 MG tablet   tamsulosin  (FLOMAX ) 0.4 MG CAPS capsule   HISTORY: Past Medical History:  Diagnosis Date   Allergic rhinitis    Anxiety    Atherosclerosis of both carotid arteries 01/02/2022   Atherosclerosis of native coronary artery of native heart without angina pectoris 01/02/2022   Bronchitis    CAD (coronary artery disease)    DDD (degenerative disc disease), lumbosacral 08/29/2021   Depression    Diverticulitis    Eustachian tube dysfunction 11/10/2023   Ganglion cyst of volar aspect of left wrist 04/09/2012   GERD (gastroesophageal reflux disease)    Heart palpitations 05/14/2020   History of hiatal hernia    History of  kidney stones    Hypercholesteremia    Hyperlipidemia 04/09/2012   Hyperplastic colon polyp 12/30/2008   Hypertension    Insomnia    Left sided sciatica 05/19/2015   Long term current use of clopidogrel     Lumbar stenosis with neurogenic claudication 02/06/2017   Migraine    NSVT (nonsustained ventricular tachycardia) (HCC) 06/2015   Onychomycosis of left great toe 09/28/2023   Pleurisy    Pneumonia    PONV (postoperative nausea and vomiting)    Restless leg syndrome 08/29/2021   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   CORONARY ARTER(CABG) X2  LIMA to LAD and SVG TO OM1 01/09/19    Sciatica of left side    SOB (shortness of breath)    Unstable angina (HCC)    Vitamin D  deficiency    Past Surgical History:  Procedure Laterality Date   BREAST BIOPSY     CESAREAN SECTION     COLONOSCOPY     COLONOSCOPY N/A 07/07/2022   Procedure: COLONOSCOPY;  Surgeon: Onita Elspeth Sharper, DO;  Location[facility] ENDOSCOPY;  Service: Gastroenterology;  Laterality: N/A;   CORONARY ANGIOPLASTY     CORONARY ARTERY BYPASS GRAFT N/A 01/09/2019   Procedure: CORONARY ARTERY BYPASS GRAFTING (CABG) X2 USING LEFT INTERNAL MAMMARY ARTERY TO CIRC AND RIGHT GREATER SAPHENOUS VEIN TO LAD.;  Surgeon: Shyrl Linnie KIDD, MD;  Location: [facility] OR;  Service: Open Heart Surgery;  Laterality: N/A;   CORONARY ARTERY BYPASS GRAFT     ESOPHAGOGASTRODUODENOSCOPY N/A 07/07/2022   Procedure: ESOPHAGOGASTRODUODENOSCOPY (EGD);  Surgeon: Onita Elspeth Sharper, DO;  Location: [facility] ENDOSCOPY;  Service: Gastroenterology;  Laterality: N/A;   ETHMOIDECTOMY Bilateral 06/26/2014   Procedure: BILATERAL ANTERIOR ETHMOIDECTOMY;  Surgeon: Lonni FORBES Angle, MD;  Location: [facility];  Service: ENT;  Laterality: Bilateral;   LEFT HEART CATH AND CORONARY ANGIOGRAPHY N/A 01/08/2019   Procedure: LEFT HEART CATH AND CORONARY ANGIOGRAPHY;  Surgeon: Elmira Newman PARAS, MD;  Location: [facility] INVASIVE CV LAB;  Service: Cardiovascular;  Laterality:  N/A;   LEFT HEART CATH AND CORS/GRAFTS ANGIOGRAPHY Left 02/21/2024   Procedure: LEFT HEART CATH AND CORS/GRAFTS ANGIOGRAPHY;  Surgeon: Florencio Cara BIRCH, MD;  Location: [facility] INVASIVE CV LAB;  Service: Cardiovascular;  Laterality: Left;   MAXILLARY ANTROSTOMY Bilateral 06/26/2014   Procedure: BILATERAL MAXILLARY OSTEA ENLARGEMENT;  Surgeon: Lonni FORBES Angle, MD;  Location: [facility];  Service: ENT;  Laterality: Bilateral;   NASAL SEPTOPLASTY W/ TURBINOPLASTY Bilateral 06/26/2014   Procedure: BILATERAL NASAL SEPTOPLASTY WITH TURBINATE REDUCTION;  Surgeon: Lonni FORBES Angle, MD;  Location: [facility];  Service: ENT;  Laterality: Bilateral;   SHOULDER ARTHROSCOPY W/ ROTATOR CUFF REPAIR  08/2013   right   TEE WITHOUT CARDIOVERSION  01/09/2019   Procedure: Transesophageal Echocardiogram (Tee);  Surgeon: Shyrl Linnie KIDD, MD;  Location: [facility] OR;  Service: Open Heart Surgery;;   TONSILLECTOMY AND ADENOIDECTOMY     TOTAL ABDOMINAL HYSTERECTOMY     partial   TUBAL LIGATION     Family History  Problem Relation Age of Onset   Heart disease Mother        MI   Irritable bowel syndrome Mother    Migraines Mother    COPD Mother    Emphysema Mother    Cerebral aneurysm Father    Pancreatic cancer Father    AAA (abdominal aortic aneurysm) Sister    Breast cancer Paternal Grandmother    Lung cancer Paternal Grandfather    Colon cancer Neg Hx    Esophageal cancer Neg Hx    Rectal cancer Neg Hx    Stomach cancer Neg Hx    Social History   Tobacco Use   Smoking status: Every Day    Current packs/day: 1.00    Average packs/day: 1 pack/day for 36.4 years (36.4 ttl pk-yrs)    Types: Cigarettes    Start date: 12/30/2018    Passive exposure: Past   Smokeless tobacco: Never   Tobacco comments:    Patient wants to quit smoking, smokes 15 cigarettes a day.  Substance Use Topics   Alcohol  use: No   LABS:  Lab Results  Component Value Date   WBC 9.1  01/31/2024   HGB 13.8 01/31/2024   HCT 40.5 01/31/2024   MCV 89.0 01/31/2024   PLT 161 01/31/2024   Lab Results  Component Value Date   NA 139 01/31/2024   CL 107 01/31/2024   K 3.7 01/31/2024   CO2 20 (L) 01/31/2024   BUN 12 01/31/2024   CREATININE 0.82 01/31/2024   GFRNONAA >60 01/31/2024   CALCIUM  8.7 (L) 01/31/2024   ALBUMIN  4.3 11/03/2023   GLUCOSE 85 01/31/2024   ECG: Date: 02/21/2024  Time ECG obtained: 1045 AM Rate: 52 bpm Rhythm: sinus bradycardia Axis (leads I and aVF): normal Intervals: PR 172 ms. QRS 89 ms. QTc 415 ms. ST segment and T wave changes: Nonspecific lateral T wave abnormalities Evidence of a possible, age undetermined, prior infarct:  No Comparison: Similar to previous tracing obtained on 04/20/2023   IMAGING / PROCEDURES:  LEFT HEART CATHETERIZATION AND CORONARY ANGIOGRAPHY performed on 02/21/2024 Low normal left ventricular systolic function with EF of 50-55% Normal LVEDP Inferior basal hypokinesis Multivessel CAD 90% mid LM 60% mid LAD 95% ostial RCA 100% SVG-OM1 Bypass grafts Patent LIMA-LAD Occluded SVG-OM1 at the origin No significant collateral formation Recommendations Intervention deferred Case to be reviewed by tertiary care facility Concern for ostial RCA Concern for undetected large circumflex with what appears to be an occluded graft Patient tolerated procedure well. No complications    CT RENAL STONE STUDY performed on 01/31/2024 Nonobstructing 8 mm calculus lower pole left kidney. Mild left perinephric fat stranding as well as mural thickening of the left renal pelvis and proximal left ureter. Findings may suggest underlying infection, and correlation with urinalysis is recommended. Abdominal aortic aneurysm measuring 3.3 cm. Recommend surveillance ultrasound in 3 years. Reference: Journal of Vascular Surgery 67.1 (2018): 2-77. J Am Coll Radiol [phone]. Aortic atherosclerosis  MYOCARDIAL PERFUSION IMAGING STUDY  (LEXISCAN ) performed on 01/24/2024 EKG nondiagnostic due to baseline ST-T changes.  SPECT images demonstrate small area of mild ischemia in apical anterior wall and true apex, sum difference score 2.  Left ventricle normal in size with LVEF of 58%.  No significant TID.  Pharmacological stress test is abnormal, but considered low risk.  TRANSTHORACIC ECHOCARDIOGRAM performed on 12/19/2023 Low normal left ventricular systolic function with an EF of 50% Mild LVH No regional wall motion abnormalities Left ventricular diastolic Doppler parameters consistent with pseudonormalization (G2DD). Normal right ventricular size and function Trivial AR; mild MR, PR, TR RVSP = 34 mmHg Normal gradients; no valvular stenosis  VAS US  CAROTID performed on 10/06/2023 Velocities in the right ICA are consistent with a 40-59% stenosis. Non-hemodynamically significant plaque <50% noted in the CCA. Velocities in the left ICA are consistent with a 40-59% stenosis. Non-hemodynamically significant plaque <50% noted in the CCA.  Bilateral vertebral arteries demonstrate antegrade flow.  Right subclavian artery flow was disturbed. Normal flow hemodynamics were seen in the left subclavian artery.   IMPRESSION AND PLAN: Regina Perry has been referred for pre-anesthesia review and clearance prior to her undergoing the planned anesthetic and procedural courses. Available labs, pertinent testing, and imaging results were personally reviewed by me in preparation for upcoming operative/procedural course. [facility] medical record has been updated following extensive record review and patient interview with PAT staff.   This patient has been appropriately cleared by cardiology with an overall MODERATE risk of patient experiencing significant perioperative cardiovascular complications. here at [facility]. Based on clinical review performed today (03/08/2024), barring any significant acute changes  in the patient's overall condition, it is anticipated that she will be able to proceed with the planned surgical intervention. Any acute changes in clinical condition may necessitate her procedure being postponed and/or cancelled. Patient will meet with anesthesia team (MD and/or CRNA) on the day of her procedure for preoperative evaluation/assessment. Questions regarding anesthetic course will be fielded at that time.   Pre-surgical instructions were reviewed with the patient during his PAT appointment, and questions were fielded to satisfaction by PAT clinical staff. She has been instructed on which medications that she will need to hold prior to surgery, as well as the ones that have been deemed safe/appropriate to take on the day of her procedure. As part of the general education provided by PAT, patient made aware both verbally and in writing, that she would need to abstain from the use of any illegal substances during her perioperative course.  She was advised that failure to follow the provided instructions could necessitate case cancellation or result in serious perioperative complications up to and including death. Patient encouraged to contact PAT and/or her surgeon's office to discuss any questions or concerns that may arise prior to surgery; verbalized understanding.   Dorise Pereyra, MSN, APRN, FNP-C, CEN [facility]  Perioperative Services Nurse Practitioner Phone: [phone] Fax: [phone] 03/08/2024 9:19 AM  NOTE: This note has been prepared using Dragon dictation software. Despite my best ability to proofread, there is always the potential that unintentional transcriptional errors may still occur from this process.     [1]  Allergies Allergen Reactions   Levofloxacin Nausea Only   Percocet [Oxycodone -Acetaminophen ] Other (See Comments)    Sick on stomach when taken with anesthesia

## 2024-03-08 NOTE — Telephone Encounter (Signed)
 Patient aware that surgery has been cancelled due to being unable to hold Plavix . Patient advised to call us  back to be rescheduled in January after Cardiac Procedure. Patient verbalized understanding.

## 2024-03-08 NOTE — Telephone Encounter (Signed)
 Called pt to schedule recall,  but patient is being followed by cardiologist at [facility] now. Recall deleted.

## 2024-03-09 LAB — CULTURE, URINE COMPREHENSIVE

## 2024-03-14 ENCOUNTER — Encounter: Payer: Self-pay | Admitting: Urgent Care

## 2024-03-14 ENCOUNTER — Encounter: Admission: RE | Payer: Self-pay | Source: Home / Self Care

## 2024-03-14 ENCOUNTER — Ambulatory Visit: Admission: RE | Admit: 2024-03-14 | Admitting: Urology

## 2024-03-14 SURGERY — CYSTOSCOPY/URETEROSCOPY/HOLMIUM LASER/STENT PLACEMENT
Anesthesia: General | Laterality: Left

## 2024-03-16 ENCOUNTER — Emergency Department

## 2024-03-16 ENCOUNTER — Inpatient Hospital Stay (HOSPITAL_COMMUNITY)

## 2024-03-16 ENCOUNTER — Emergency Department
Admission: EM | Admit: 2024-03-16 | Discharge: 2024-03-16 | Disposition: A | Discharge status: Other Acute Inpatient Hospital | Attending: Emergency Medicine | Admitting: Emergency Medicine

## 2024-03-16 ENCOUNTER — Inpatient Hospital Stay (HOSPITAL_COMMUNITY)
Admission: EM | Admit: 2024-03-16 | Discharge: 2024-03-28 | DRG: 064 | Disposition: E | Source: Other Acute Inpatient Hospital | Attending: Pulmonary Disease | Admitting: Pulmonary Disease

## 2024-03-16 ENCOUNTER — Encounter (HOSPITAL_COMMUNITY): Payer: Self-pay

## 2024-03-16 DIAGNOSIS — R404 Transient alteration of awareness: Secondary | ICD-10-CM | POA: Diagnosis not present

## 2024-03-16 DIAGNOSIS — Z825 Family history of asthma and other chronic lower respiratory diseases: Secondary | ICD-10-CM

## 2024-03-16 DIAGNOSIS — G931 Anoxic brain damage, not elsewhere classified: Secondary | ICD-10-CM | POA: Diagnosis present

## 2024-03-16 DIAGNOSIS — E7800 Pure hypercholesterolemia, unspecified: Secondary | ICD-10-CM | POA: Diagnosis present

## 2024-03-16 DIAGNOSIS — E785 Hyperlipidemia, unspecified: Secondary | ICD-10-CM

## 2024-03-16 DIAGNOSIS — Z803 Family history of malignant neoplasm of breast: Secondary | ICD-10-CM

## 2024-03-16 DIAGNOSIS — I10 Essential (primary) hypertension: Secondary | ICD-10-CM

## 2024-03-16 DIAGNOSIS — Z955 Presence of coronary angioplasty implant and graft: Secondary | ICD-10-CM | POA: Diagnosis not present

## 2024-03-16 DIAGNOSIS — I25110 Atherosclerotic heart disease of native coronary artery with unstable angina pectoris: Secondary | ICD-10-CM | POA: Insufficient documentation

## 2024-03-16 DIAGNOSIS — Z951 Presence of aortocoronary bypass graft: Secondary | ICD-10-CM

## 2024-03-16 DIAGNOSIS — J449 Chronic obstructive pulmonary disease, unspecified: Secondary | ICD-10-CM | POA: Insufficient documentation

## 2024-03-16 DIAGNOSIS — Z515 Encounter for palliative care: Secondary | ICD-10-CM | POA: Diagnosis not present

## 2024-03-16 DIAGNOSIS — I726 Aneurysm of vertebral artery: Secondary | ICD-10-CM | POA: Diagnosis present

## 2024-03-16 DIAGNOSIS — F17210 Nicotine dependence, cigarettes, uncomplicated: Secondary | ICD-10-CM

## 2024-03-16 DIAGNOSIS — I959 Hypotension, unspecified: Secondary | ICD-10-CM | POA: Diagnosis not present

## 2024-03-16 DIAGNOSIS — F32A Depression, unspecified: Secondary | ICD-10-CM | POA: Diagnosis present

## 2024-03-16 DIAGNOSIS — G936 Cerebral edema: Secondary | ICD-10-CM | POA: Diagnosis present

## 2024-03-16 DIAGNOSIS — Z79899 Other long term (current) drug therapy: Secondary | ICD-10-CM

## 2024-03-16 DIAGNOSIS — Z7902 Long term (current) use of antithrombotics/antiplatelets: Secondary | ICD-10-CM | POA: Diagnosis not present

## 2024-03-16 DIAGNOSIS — J9601 Acute respiratory failure with hypoxia: Secondary | ICD-10-CM | POA: Diagnosis present

## 2024-03-16 DIAGNOSIS — G935 Compression of brain: Secondary | ICD-10-CM | POA: Diagnosis present

## 2024-03-16 DIAGNOSIS — I469 Cardiac arrest, cause unspecified: Secondary | ICD-10-CM | POA: Diagnosis present

## 2024-03-16 DIAGNOSIS — Z66 Do not resuscitate: Secondary | ICD-10-CM | POA: Diagnosis present

## 2024-03-16 DIAGNOSIS — I607 Nontraumatic subarachnoid hemorrhage from unspecified intracranial artery: Secondary | ICD-10-CM | POA: Insufficient documentation

## 2024-03-16 DIAGNOSIS — I714 Abdominal aortic aneurysm, without rupture, unspecified: Secondary | ICD-10-CM

## 2024-03-16 DIAGNOSIS — Z8249 Family history of ischemic heart disease and other diseases of the circulatory system: Secondary | ICD-10-CM

## 2024-03-16 DIAGNOSIS — I609 Nontraumatic subarachnoid hemorrhage, unspecified: Secondary | ICD-10-CM | POA: Diagnosis present

## 2024-03-16 DIAGNOSIS — G2581 Restless legs syndrome: Secondary | ICD-10-CM | POA: Diagnosis present

## 2024-03-16 DIAGNOSIS — R680 Hypothermia, not associated with low environmental temperature: Secondary | ICD-10-CM | POA: Diagnosis present

## 2024-03-16 DIAGNOSIS — G253 Myoclonus: Secondary | ICD-10-CM | POA: Diagnosis present

## 2024-03-16 DIAGNOSIS — G43909 Migraine, unspecified, not intractable, without status migrainosus: Secondary | ICD-10-CM | POA: Diagnosis present

## 2024-03-16 DIAGNOSIS — J440 Chronic obstructive pulmonary disease with acute lower respiratory infection: Secondary | ICD-10-CM | POA: Diagnosis present

## 2024-03-16 DIAGNOSIS — I2510 Atherosclerotic heart disease of native coronary artery without angina pectoris: Secondary | ICD-10-CM | POA: Diagnosis present

## 2024-03-16 DIAGNOSIS — Z800 Family history of malignant neoplasm of digestive organs: Secondary | ICD-10-CM

## 2024-03-16 DIAGNOSIS — Z90710 Acquired absence of both cervix and uterus: Secondary | ICD-10-CM

## 2024-03-16 DIAGNOSIS — H5704 Mydriasis: Secondary | ICD-10-CM | POA: Diagnosis present

## 2024-03-16 DIAGNOSIS — F419 Anxiety disorder, unspecified: Secondary | ICD-10-CM | POA: Diagnosis present

## 2024-03-16 DIAGNOSIS — K219 Gastro-esophageal reflux disease without esophagitis: Secondary | ICD-10-CM

## 2024-03-16 DIAGNOSIS — Z801 Family history of malignant neoplasm of trachea, bronchus and lung: Secondary | ICD-10-CM

## 2024-03-16 DIAGNOSIS — I700 Atherosclerosis of aorta: Secondary | ICD-10-CM | POA: Diagnosis present

## 2024-03-16 DIAGNOSIS — M48062 Spinal stenosis, lumbar region with neurogenic claudication: Secondary | ICD-10-CM | POA: Diagnosis present

## 2024-03-16 DIAGNOSIS — Z87442 Personal history of urinary calculi: Secondary | ICD-10-CM

## 2024-03-16 DIAGNOSIS — I606 Nontraumatic subarachnoid hemorrhage from other intracranial arteries: Secondary | ICD-10-CM

## 2024-03-16 DIAGNOSIS — Z886 Allergy status to analgesic agent status: Secondary | ICD-10-CM

## 2024-03-16 DIAGNOSIS — A419 Sepsis, unspecified organism: Secondary | ICD-10-CM

## 2024-03-16 DIAGNOSIS — Z7401 Bed confinement status: Secondary | ICD-10-CM | POA: Diagnosis not present

## 2024-03-16 DIAGNOSIS — R079 Chest pain, unspecified: Secondary | ICD-10-CM | POA: Diagnosis present

## 2024-03-16 DIAGNOSIS — Z860100 Personal history of colon polyps, unspecified: Secondary | ICD-10-CM

## 2024-03-16 DIAGNOSIS — Z9861 Coronary angioplasty status: Secondary | ICD-10-CM

## 2024-03-16 DIAGNOSIS — Z881 Allergy status to other antibiotic agents status: Secondary | ICD-10-CM

## 2024-03-16 DIAGNOSIS — Z885 Allergy status to narcotic agent status: Secondary | ICD-10-CM

## 2024-03-16 LAB — PROTIME-INR
INR: 1.2 (ref 0.8–1.2)
Prothrombin Time: 15.5 s — ABNORMAL HIGH (ref 11.4–15.2)

## 2024-03-16 LAB — COMPREHENSIVE METABOLIC PANEL WITH GFR
ALT: 38 U/L (ref 0–44)
AST: 50 U/L — ABNORMAL HIGH (ref 15–41)
Albumin: 3.5 g/dL (ref 3.5–5.0)
Alkaline Phosphatase: 66 U/L (ref 38–126)
Anion gap: 15 (ref 5–15)
BUN: 11 mg/dL (ref 8–23)
CO2: 19 mmol/L — ABNORMAL LOW (ref 22–32)
Calcium: 7.3 mg/dL — ABNORMAL LOW (ref 8.9–10.3)
Chloride: 108 mmol/L (ref 98–111)
Creatinine, Ser: 0.99 mg/dL (ref 0.44–1.00)
GFR, Estimated: >60 mL/min
Glucose, Bld: 254 mg/dL — ABNORMAL HIGH (ref 70–99)
Potassium: 3.7 mmol/L (ref 3.5–5.1)
Sodium: 143 mmol/L (ref 135–145)
Total Bilirubin: 0.3 mg/dL (ref 0.0–1.2)
Total Protein: 5.2 g/dL — ABNORMAL LOW (ref 6.5–8.1)

## 2024-03-16 LAB — SAMPLE TO BLOOD BANK

## 2024-03-16 LAB — CBC WITH DIFFERENTIAL/PLATELET
Abs Immature Granulocytes: 0.48 K/uL — ABNORMAL HIGH (ref 0.00–0.07)
Basophils Absolute: 0.1 K/uL (ref 0.0–0.1)
Basophils Relative: 1 %
Eosinophils Absolute: 0.1 K/uL (ref 0.0–0.5)
Eosinophils Relative: 1 %
HCT: 39.3 % (ref 36.0–46.0)
Hemoglobin: 12.8 g/dL (ref 12.0–15.0)
Immature Granulocytes: 2 %
Lymphocytes Relative: 15 %
Lymphs Abs: 3 K/uL (ref 0.7–4.0)
MCH: 30.7 pg (ref 26.0–34.0)
MCHC: 32.6 g/dL (ref 30.0–36.0)
MCV: 94.2 fL (ref 80.0–100.0)
Monocytes Absolute: 0.4 K/uL (ref 0.1–1.0)
Monocytes Relative: 2 %
Neutro Abs: 15.7 K/uL — ABNORMAL HIGH (ref 1.7–7.7)
Neutrophils Relative %: 79 %
Platelets: 181 K/uL (ref 150–400)
RBC: 4.17 MIL/uL (ref 3.87–5.11)
RDW: 13.2 % (ref 11.5–15.5)
WBC: 19.7 K/uL — ABNORMAL HIGH (ref 4.0–10.5)
nRBC: 0 % (ref 0.0–0.2)

## 2024-03-16 LAB — URINALYSIS, W/ REFLEX TO CULTURE (INFECTION SUSPECTED)
Bacteria, UA: NONE SEEN
Bilirubin Urine: NEGATIVE
Glucose, UA: >=500 mg/dL — AB
Ketones, ur: NEGATIVE mg/dL
Leukocytes,Ua: NEGATIVE
Nitrite: NEGATIVE
Protein, ur: 30 mg/dL — AB
Specific Gravity, Urine: 1.005 (ref 1.005–1.030)
pH: 6 (ref 5.0–8.0)

## 2024-03-16 LAB — BLOOD GAS, ARTERIAL
Acid-base deficit: 6.6 mmol/L — ABNORMAL HIGH (ref 0.0–2.0)
Bicarbonate: 20.6 mmol/L (ref 20.0–28.0)
FIO2: 0.5 %
MECHVT: 450 mL
O2 Saturation: 96.6 %
PEEP: 5 cmH2O
Patient temperature: 37
RATE: 16 {breaths}/min
pCO2 arterial: 47 mmHg (ref 32–48)
pH, Arterial: 7.25 — ABNORMAL LOW (ref 7.35–7.45)
pO2, Arterial: 76 mmHg — ABNORMAL LOW (ref 83–108)

## 2024-03-16 LAB — PHOSPHORUS: Phosphorus: 5.2 mg/dL — ABNORMAL HIGH (ref 2.5–4.6)

## 2024-03-16 LAB — URINE DRUG SCREEN
Amphetamines: NEGATIVE
Barbiturates: NEGATIVE
Benzodiazepines: POSITIVE — AB
Cocaine: NEGATIVE
Fentanyl: POSITIVE — AB
Methadone Scn, Ur: NEGATIVE
Opiates: NEGATIVE
Tetrahydrocannabinol: NEGATIVE

## 2024-03-16 LAB — RESP PANEL BY RT-PCR (RSV, FLU A&B, COVID)  RVPGX2
Influenza A by PCR: NEGATIVE
Influenza B by PCR: NEGATIVE
Resp Syncytial Virus by PCR: NEGATIVE
SARS Coronavirus 2 by RT PCR: NEGATIVE

## 2024-03-16 LAB — TSH: TSH: 3.39 u[IU]/mL (ref 0.350–4.500)

## 2024-03-16 LAB — SALICYLATE LEVEL: Salicylate Lvl: <7 mg/dL — ABNORMAL LOW (ref 7.0–30.0)

## 2024-03-16 LAB — MRSA NEXT GEN BY PCR, NASAL: MRSA by PCR Next Gen: NOT DETECTED

## 2024-03-16 LAB — MAGNESIUM: Magnesium: 2 mg/dL (ref 1.7–2.4)

## 2024-03-16 LAB — ACETAMINOPHEN LEVEL: Acetaminophen (Tylenol), Serum: <10 ug/mL — ABNORMAL LOW (ref 10–30)

## 2024-03-16 LAB — TROPONIN T, HIGH SENSITIVITY: Troponin T High Sensitivity: 125 ng/L (ref 0–19)

## 2024-03-16 LAB — ETHANOL: Alcohol, Ethyl (B): <15 mg/dL

## 2024-03-16 LAB — T4, FREE: Free T4: 0.81 ng/dL (ref 0.80–2.00)

## 2024-03-16 MED ORDER — LABETALOL HCL 5 MG/ML IV SOLN
10.0000 mg | Freq: Once | INTRAVENOUS | Status: AC
  Administered 2024-03-16: 10 mg via INTRAVENOUS
  Filled 2024-03-16: qty 4

## 2024-03-16 MED ORDER — ACETAMINOPHEN 160 MG/5ML PO SOLN: 650.0000 mg | ORAL | Status: DC | PRN

## 2024-03-16 MED ORDER — ACETAMINOPHEN 325 MG PO TABS: 650.0000 mg | ORAL_TABLET | ORAL | Status: DC | PRN

## 2024-03-16 MED ORDER — FAMOTIDINE 20 MG PO TABS: 20.0000 mg | ORAL_TABLET | Freq: Two times a day (BID) | ORAL | Status: DC

## 2024-03-16 MED ORDER — IOHEXOL 350 MG/ML SOLN
100.0000 mL | Freq: Once | INTRAVENOUS | Status: AC | PRN
  Administered 2024-03-16: 100 mL via INTRAVENOUS

## 2024-03-16 MED ORDER — FENTANYL 2500MCG IN NS 250ML (10MCG/ML) PREMIX INFUSION
INTRAVENOUS | Status: AC
  Administered 2024-03-16: 25 ug/h via INTRAVENOUS
  Filled 2024-03-16: qty 250

## 2024-03-16 MED ORDER — VANCOMYCIN HCL IN DEXTROSE 1-5 GM/200ML-% IV SOLN
1000.0000 mg | Freq: Once | INTRAVENOUS | Status: DC
  Filled 2024-03-16: qty 200

## 2024-03-16 MED ORDER — CHLORHEXIDINE GLUCONATE CLOTH 2 % EX PADS
6.0000 | MEDICATED_PAD | Freq: Every day | CUTANEOUS | Status: DC
  Administered 2024-03-17: 6 via TOPICAL

## 2024-03-16 MED ORDER — LORAZEPAM 2 MG/ML IJ SOLN
2.0000 mg | Freq: Once | INTRAMUSCULAR | Status: AC
  Administered 2024-03-16: 2 mg via INTRAVENOUS
  Filled 2024-03-16: qty 1

## 2024-03-16 MED ORDER — IOHEXOL 350 MG/ML SOLN
75.0000 mL | Freq: Once | INTRAVENOUS | Status: AC | PRN
  Administered 2024-03-16: 75 mL via INTRAVENOUS

## 2024-03-16 MED ORDER — EPINEPHRINE HCL 5 MG/250ML IV SOLN IN NS: 0.5000 ug/min | INTRAVENOUS | Status: DC

## 2024-03-16 MED ORDER — NIMODIPINE 6 MG/ML PO SOLN
60.0000 mg | ORAL | Status: DC
  Filled 2024-03-16: qty 10

## 2024-03-16 MED ORDER — SODIUM CHLORIDE 0.9 % IV SOLN
1.0000 g | Freq: Once | INTRAVENOUS | Status: AC
  Administered 2024-03-16: 1 g via INTRAVENOUS
  Filled 2024-03-16: qty 20

## 2024-03-16 MED ORDER — PANTOPRAZOLE SODIUM 40 MG IV SOLR: 40.0000 mg | Freq: Once | INTRAVENOUS | Status: DC

## 2024-03-16 MED ORDER — PIPERACILLIN-TAZOBACTAM 3.375 G IVPB
3.3750 g | Freq: Three times a day (TID) | INTRAVENOUS | Status: DC
  Administered 2024-03-17: 3.375 g via INTRAVENOUS
  Filled 2024-03-16: qty 50

## 2024-03-16 MED ORDER — SODIUM CHLORIDE 0.9 % IV SOLN: 2.0000 g | Freq: Once | INTRAVENOUS | Status: DC

## 2024-03-16 MED ORDER — LEVETIRACETAM (KEPPRA) 500 MG/5 ML ADULT IV PUSH
1000.0000 mg | INTRAVENOUS | Status: AC
  Administered 2024-03-16: 1000 mg via INTRAVENOUS
  Filled 2024-03-16: qty 10

## 2024-03-16 MED ORDER — DOCUSATE SODIUM 50 MG/5ML PO LIQD: 100.0000 mg | Freq: Two times a day (BID) | ORAL | Status: DC

## 2024-03-16 MED ORDER — SODIUM CHLORIDE 0.9 % IV SOLN: 500.0000 mg | Freq: Once | INTRAVENOUS | Status: DC

## 2024-03-16 MED ORDER — PHENYLEPHRINE HCL-NACL 20-0.9 MG/250ML-% IV SOLN
25.0000 ug/min | INTRAVENOUS | Status: DC
  Administered 2024-03-17: 25 ug/min via INTRAVENOUS
  Filled 2024-03-16: qty 250

## 2024-03-16 MED ORDER — SODIUM CHLORIDE 0.9 % IV SOLN: INTRAVENOUS | Status: DC

## 2024-03-16 MED ORDER — STROKE: EARLY STAGES OF RECOVERY BOOK: Freq: Once | Status: DC

## 2024-03-16 MED ORDER — ACETAMINOPHEN 650 MG RE SUPP: 650.0000 mg | RECTAL | Status: DC

## 2024-03-16 MED ORDER — ORAL CARE MOUTH RINSE
15.0000 mL | OROMUCOSAL | Status: DC
  Administered 2024-03-16 – 2024-03-17 (×5): 15 mL via OROMUCOSAL

## 2024-03-16 MED ORDER — FENTANYL 2500MCG IN NS 250ML (10MCG/ML) PREMIX INFUSION: 0.0000 ug/h | INTRAVENOUS | Status: DC

## 2024-03-16 MED ORDER — SODIUM CHLORIDE 0.9 % IV SOLN: 250.0000 mL | INTRAVENOUS | Status: DC

## 2024-03-16 MED ORDER — CLEVIDIPINE BUTYRATE 0.5 MG/ML IV EMUL
0.0000 mg/h | INTRAVENOUS | Status: DC
  Administered 2024-03-17: 2 mg/h via INTRAVENOUS
  Filled 2024-03-16: qty 50

## 2024-03-16 MED ORDER — PANTOPRAZOLE SODIUM 40 MG IV SOLR: 40.0000 mg | Freq: Two times a day (BID) | INTRAVENOUS | Status: DC

## 2024-03-16 MED ORDER — PANTOPRAZOLE SODIUM 40 MG IV SOLR
40.0000 mg | Freq: Every day | INTRAVENOUS | Status: DC
  Filled 2024-03-16: qty 10

## 2024-03-16 MED ORDER — ACETAMINOPHEN 650 MG RE SUPP: 650.0000 mg | RECTAL | Status: DC | PRN

## 2024-03-16 MED ORDER — ACETAMINOPHEN 10 MG/ML IV SOLN
1000.0000 mg | Freq: Once | INTRAVENOUS | Status: DC
  Filled 2024-03-16: qty 100

## 2024-03-16 MED ORDER — POLYETHYLENE GLYCOL 3350 17 G PO PACK: 17.0000 g | PACK | Freq: Every day | ORAL | Status: DC

## 2024-03-16 MED ORDER — PANTOPRAZOLE SODIUM 40 MG PO TBEC: 40.0000 mg | DELAYED_RELEASE_TABLET | Freq: Two times a day (BID) | ORAL | Status: DC

## 2024-03-16 MED ORDER — EPINEPHRINE HCL 5 MG/250ML IV SOLN IN NS
INTRAVENOUS | Status: AC
  Administered 2024-03-16: 0.5 ug/min via INTRAVENOUS
  Filled 2024-03-16: qty 250

## 2024-03-16 MED ORDER — VANCOMYCIN HCL IN DEXTROSE 1-5 GM/200ML-% IV SOLN
1000.0000 mg | INTRAVENOUS | Status: DC
  Filled 2024-03-16: qty 200

## 2024-03-16 MED ORDER — ACETAMINOPHEN 160 MG/5ML PO SOLN
650.0000 mg | ORAL | Status: DC
  Administered 2024-03-17: 650 mg
  Filled 2024-03-16: qty 20.3

## 2024-03-16 MED ORDER — ONDANSETRON 4 MG PO TBDP: 4.0000 mg | ORAL_TABLET | Freq: Four times a day (QID) | ORAL | Status: DC | PRN

## 2024-03-16 MED ORDER — ACETAMINOPHEN 325 MG PO TABS: 650.0000 mg | ORAL_TABLET | ORAL | Status: DC

## 2024-03-16 MED ORDER — SODIUM CHLORIDE 0.9 % IV BOLUS (SEPSIS)
1000.0000 mL | Freq: Once | INTRAVENOUS | Status: AC
  Administered 2024-03-16: 1000 mL via INTRAVENOUS

## 2024-03-16 MED ORDER — NIMODIPINE 30 MG PO CAPS: 60.0000 mg | ORAL_CAPSULE | ORAL | Status: DC

## 2024-03-16 MED ORDER — ONDANSETRON HCL 4 MG/2ML IJ SOLN: 4.0000 mg | Freq: Four times a day (QID) | INTRAMUSCULAR | Status: DC | PRN

## 2024-03-16 MED ORDER — PANTOPRAZOLE SODIUM 40 MG PO TBEC: 40.0000 mg | DELAYED_RELEASE_TABLET | Freq: Every day | ORAL | Status: DC

## 2024-03-16 MED ORDER — ORAL CARE MOUTH RINSE: 15.0000 mL | OROMUCOSAL | Status: DC | PRN

## 2024-03-16 MED ORDER — SODIUM CHLORIDE 0.9 % IV BOLUS
1000.0000 mL | Freq: Once | INTRAVENOUS | Status: AC
  Administered 2024-03-16: 1000 mL via INTRAVENOUS

## 2024-03-16 NOTE — Progress Notes (Signed)
" °  Chaplain On-Call responded to a call from the ED Secretary who reported the patient arriving to ED room 2 with CPR in progress.  Chaplain met the patient's husband and daughter in the hallway and provided spiritual and emotional support for them.  Chaplain assured them and ED Staff of availability as needed.  (Chaplain learned later that the patient was transferred to [facility], [address] [facility] ICU).  Chaplain Bebe Ardean EMERSON Hershal., BCC "

## 2024-03-16 NOTE — ED Provider Notes (Signed)
 "  [facility] Provider Note    Event Date/Time   First MD Initiated Contact with Patient 03/16/24 1640     (approximate)   History   Chief Complaint: POST ARREST   HPI  Regina Perry is a 62 y.o. female with a past history of aortic aneurysm, GERD, hypertension, unstable angina and CABG x 2 who was brought to the ED after witnessed cardiac arrest at home.  Family reports that patient had been feeling sick with a cold for the last 2 weeks, and then was vomiting today.  This morning she also started complaining of chest pain and stating that it felt like she was having a heart attack.  While waiting for EMS to arrive, the patient collapsed.  EMS report initial rhythm was PEA.  They gave 3 rounds of epi, total CPR of 30 minutes with ROSC.  She was hypotensive after ROSC so they started a epinephrine  drip.  She was intubated prior to arrival.        Past Medical History:  Diagnosis Date   AAA (abdominal aortic aneurysm)    Allergic rhinitis    Anxiety    a.) on BZO (lorazepam ) PRN   Aortic atherosclerosis    Atherosclerosis of both carotid arteries 01/02/2022   Atherosclerosis of native coronary artery of native heart without angina pectoris 01/02/2022   CAD (coronary artery disease)    a.) s/p 2v CABG 01/09/2019   COPD (chronic obstructive pulmonary disease) (HCC)    DDD (degenerative disc disease), lumbosacral 08/29/2021   Depression    Diastolic dysfunction    Diverticulitis    Eustachian tube dysfunction 11/10/2023   Ganglion cyst of volar aspect of left wrist 04/09/2012   GERD (gastroesophageal reflux disease)    Heart palpitations 05/14/2020   History of hiatal hernia    History of kidney stones    Hypercholesteremia    Hyperlipidemia 04/09/2012   Hyperplastic colon polyp 12/30/2008   Hypertension    Insomnia    Left sided sciatica 05/19/2015   Long term current use of clopidogrel     Lumbar stenosis with neurogenic claudication 02/06/2017    Migraine    NSVT (nonsustained ventricular tachycardia) (HCC) 06/2015   Onychomycosis of left great toe 09/28/2023   Pleurisy    Pneumonia    PONV (postoperative nausea and vomiting)    Restless leg syndrome 08/29/2021   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   a.) LIMA-LAD, SVG-OM1   Sciatica of left side    SOB (shortness of breath)    Unstable angina [facility])    Vitamin D  deficiency     Current Outpatient Rx   Order #: [id number] Class: Historical Med   Order #: [id number] Class: Normal   Order #: [id number] Class: Normal   Order #: [id number] Class: Normal   Order #: [id number] Class: Normal   Order #: [id number] Class: Historical Med   Order #: [id number] Class: Normal   Order #: [id number] Class: Historical Med   Order #: [id number] Class: Normal   Order #: [id number] Class: Historical Med   Order #: [id number] Class: Normal   Order #: [id number] Class: Normal   Order #: [id number] Class: Normal   Order #: [id number] Class: Normal   Order #: [id number] Class: Normal   Order #: [id number] Class: Normal    Past Surgical History:  Procedure Laterality Date   BREAST BIOPSY     CESAREAN SECTION     COLONOSCOPY     COLONOSCOPY N/A 07/07/2022   Procedure: COLONOSCOPY;  Surgeon: Onita Elspeth Sharper, DO;  Location: [facility] ENDOSCOPY;  Service:  Gastroenterology;  Laterality: N/A;   CORONARY ANGIOPLASTY     CORONARY ARTERY BYPASS GRAFT N/A 01/09/2019   Procedure: CORONARY ARTERY BYPASS GRAFTING (CABG) X2 USING LEFT INTERNAL MAMMARY ARTERY TO CIRC AND RIGHT GREATER SAPHENOUS VEIN TO LAD.;  Surgeon: Shyrl Linnie KIDD, MD;  Location: [facility] OR;  Service: Open Heart Surgery;  Laterality: N/A;   CORONARY ARTERY BYPASS GRAFT     ESOPHAGOGASTRODUODENOSCOPY N/A 07/07/2022   Procedure: ESOPHAGOGASTRODUODENOSCOPY (EGD);  Surgeon: Onita Elspeth Sharper, DO;  Location: [facility] ENDOSCOPY;  Service: Gastroenterology;  Laterality: N/A;   ETHMOIDECTOMY Bilateral 06/26/2014   Procedure: BILATERAL ANTERIOR ETHMOIDECTOMY;  Surgeon: Lonni FORBES Angle, MD;  Location: [facility];  Service: ENT;  Laterality: Bilateral;   LEFT HEART CATH AND CORONARY ANGIOGRAPHY N/A 01/08/2019   Procedure: LEFT HEART CATH AND CORONARY ANGIOGRAPHY;  Surgeon: Elmira Newman PARAS, MD;  Location: [facility] INVASIVE CV LAB;  Service: Cardiovascular;  Laterality: N/A;   LEFT HEART CATH AND CORS/GRAFTS ANGIOGRAPHY Left 02/21/2024   Procedure: LEFT HEART CATH AND CORS/GRAFTS ANGIOGRAPHY;  Surgeon: Florencio Cara BIRCH, MD;  Location: [facility] INVASIVE CV LAB;  Service: Cardiovascular;  Laterality: Left;   MAXILLARY ANTROSTOMY Bilateral 06/26/2014   Procedure: BILATERAL MAXILLARY OSTEA ENLARGEMENT;  Surgeon: Lonni FORBES Angle, MD;  Location: [facility];  Service: ENT;  Laterality: Bilateral;   NASAL SEPTOPLASTY W/ TURBINOPLASTY Bilateral 06/26/2014   Procedure: BILATERAL NASAL SEPTOPLASTY WITH TURBINATE REDUCTION;  Surgeon: Lonni FORBES Angle, MD;  Location: [facility];  Service: ENT;  Laterality: Bilateral;   SHOULDER ARTHROSCOPY W/ ROTATOR CUFF REPAIR  08/2013   right   TEE WITHOUT CARDIOVERSION  01/09/2019   Procedure: Transesophageal Echocardiogram (Tee);  Surgeon: Shyrl Linnie KIDD, MD;  Location: [facility] OR;  Service: Open Heart Surgery;;   TONSILLECTOMY AND ADENOIDECTOMY     TOTAL ABDOMINAL HYSTERECTOMY     partial   TUBAL LIGATION      Physical Exam   Triage Vital Signs: ED Triage Vitals  Encounter Vitals Group     BP      Girls Systolic BP Percentile      Girls Diastolic BP Percentile      Boys Systolic BP Percentile      Boys Diastolic BP Percentile      Pulse      Resp      Temp      Temp src      SpO2      Weight      Height      Head Circumference      Peak Flow      Pain Score      Pain Loc      Pain Education      Exclude from Growth Chart     Most recent vital signs: Vitals:   03/16/24 1826 03/16/24 1827  BP: 136/86   Pulse: 80 76  Resp: (!) 21 (!) 21  Temp: (!) 93.7 F (34.3 C)  (!) 93.7 F (34.3 C)  SpO2: 100% 100%    General: Sedated, intubated CV:  Good peripheral perfusion.  Regular rate rhythm.  Intact distal pulses.  Good supportive care ultrasound performed by me is negative for pericardial effusion. Resp:  Spontaneous respirations.  Lungs clear to auscultation bilaterally. Abd:  No distention.  Soft nontender.  No pulsatile mass or ecchymosis Other:  No lower extremity edema.  No obvious trauma.   ED Results / Procedures / Treatments   Labs (all labs ordered are listed, but  only abnormal results are displayed) Labs Reviewed  COMPREHENSIVE METABOLIC PANEL WITH GFR - Abnormal; Notable for the following components:      Result Value   CO2 19 (*)    Glucose, Bld 254 (*)    Calcium  7.3 (*)    Total Protein 5.2 (*)    AST 50 (*)    All other components within normal limits  CBC WITH DIFFERENTIAL/PLATELET - Abnormal; Notable for the following components:   WBC 19.7 (*)    Neutro Abs 15.7 (*)    Abs Immature Granulocytes 0.48 (*)    All other components within normal limits  PROTIME-INR - Abnormal; Notable for the following components:   Prothrombin Time 15.5 (*)    All other components within normal limits  URINE DRUG SCREEN - Abnormal; Notable for the following components:   Benzodiazepines POSITIVE (*)    Fentanyl  POSITIVE (*)    All other components within normal limits  URINALYSIS, W/ REFLEX TO CULTURE (INFECTION SUSPECTED) - Abnormal; Notable for the following components:   Color, Urine STRAW (*)    APPearance CLEAR (*)    Glucose, UA >=500 (*)    Hgb urine dipstick SMALL (*)    Protein, ur 30 (*)    All other components within normal limits  PHOSPHORUS - Abnormal; Notable for the following components:   Phosphorus 5.2 (*)    All other components within normal limits  BLOOD GAS, ARTERIAL - Abnormal; Notable for the following components:   pH, Arterial 7.25 (*)    pO2, Arterial 76 (*)    Acid-base deficit 6.6 (*)    All other  components within normal limits  TROPONIN T, HIGH SENSITIVITY - Abnormal; Notable for the following components:   Troponin T High Sensitivity 125 (*)    All other components within normal limits  RESP PANEL BY RT-PCR (RSV, FLU A&B, COVID)  RVPGX2  CULTURE, BLOOD (ROUTINE X 2)  CULTURE, BLOOD (ROUTINE X 2)  MAGNESIUM   ETHANOL  SALICYLATE LEVEL  ACETAMINOPHEN  LEVEL  T4, FREE  TSH  LACTIC ACID, PLASMA  LACTIC ACID, PLASMA  SAMPLE TO BLOOD BANK  TROPONIN T, HIGH SENSITIVITY     EKG Interpreted by me Sinus rhythm rate of 74.  Normal axis intervals QRS ST segments T waves.  Negative for STEMI   RADIOLOGY Chest x-ray interpreted by me Endotracheal tube in good position.  Bilateral infiltrates suggestive of pneumonia or edema  PROCEDURES:  .Critical Care  Performed by: Viviann Pastor, MD Authorized by: Viviann Pastor, MD   Critical care provider statement:    Critical care time (minutes):  60   Critical care time was exclusive of:  Separately billable procedures and treating other patients   Critical care was necessary to treat or prevent imminent or life-threatening deterioration of the following conditions:  Sepsis, respiratory failure, cardiac failure, circulatory failure, shock and CNS failure or compromise   Critical care was time spent personally by me on the following activities:  Development of treatment plan with patient or surrogate, discussions with consultants, evaluation of patient's response to treatment, examination of patient, obtaining history from patient or surrogate, ordering and performing treatments and interventions, ordering and review of laboratory studies, ordering and review of radiographic studies, pulse oximetry, re-evaluation of patient's condition and review of old charts   Care discussed with: admitting provider      MEDICATIONS ORDERED IN ED: Medications  fentaNYL  in NS 250mL (74mcg/ml) infusion-PREMIX (30 mcg/hr Intravenous  Infusion Verify 03/16/24 1828)  sodium  chloride 0.9 % bolus 1,000 mL (1,000 mLs Intravenous New Bag/Given 03/16/24 1826)  vancomycin  (VANCOCIN ) IVPB 1000 mg/200 mL premix (has no administration in time range)  levETIRAcetam  (KEPPRA ) undiluted injection 1,000 mg (has no administration in time range)  acetaminophen  (OFIRMEV ) IV 1,000 mg (has no administration in time range)  pantoprazole  (PROTONIX ) injection 40 mg (has no administration in time range)  sodium chloride  0.9 % bolus 1,000 mL (1,000 mLs Intravenous New Bag/Given 03/16/24 1702)  meropenem  (MERREM ) 1 g in sodium chloride  0.9 % 100 mL IVPB (1 g Intravenous New Bag/Given 03/16/24 1826)  iohexol  (OMNIPAQUE ) 350 MG/ML injection 100 mL (100 mLs Intravenous Contrast Given 03/16/24 1813)  iohexol  (OMNIPAQUE ) 350 MG/ML injection 75 mL (75 mLs Intravenous Contrast Given 03/16/24 1901)  LORazepam  (ATIVAN ) injection 2 mg (2 mg Intravenous Given 03/16/24 1917)     IMPRESSION / MDM / ASSESSMENT AND PLAN / ED COURSE  I reviewed the triage vital signs and the nursing notes.  DDx: ACS, PE, dissection, electrolyte derangement, renal failure, anemia, toxidrome, COVID, influenza, aspiration, hypothyroidism, ruptured aneurysm, intracranial hemorrhage, dehydration  Patient's presentation is most consistent with acute presentation with potential threat to life or bodily function.  Patient presents with witnessed cardiac arrest at home, required 30 minutes of CPR, now on epinephrine  infusion to maintain perfusing blood pressure.  History and chest x-ray suggestive of aspiration pneumonia, will start antibiotics and check blood cultures.   Clinical Course as of 03/16/24 1919  Sat Mar 16, 2024  1846 CT head shows diffuse subarachnoid, likely ruptured cerebral aneurysm.  Discussed with radiologist.  Discussed with neurosurgery as well who recommends getting a CT of the head and transfer to [facility] for neuro ICU care.  Keppra .  No clear evidence on  targeted temperature management in this setting, will actively warm to avoid profound hypothermia which may worsen coagulopathy. [PS]  1848 Repeat neuroexam, patient intubated, on fentanyl  infusion, exhibiting intermittent bilateral lower extremity spasms.  Has normal biceps reflex and patellar reflex bilaterally without clonus.  Pupils 2 mm and reactive bilaterally [PS]    Clinical Course User Index [PS] Viviann Pastor, MD    ----------------------------------------- 7:19 PM on 03/16/2024 ----------------------------------------- Case discussed with [facility], ICU Dr. Kassie, accepts for transfer.  Blood pressure remains about 130 systolic with epi drip discontinued.   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   Viviann Pastor, MD 03/16/24 1919  "

## 2024-03-16 NOTE — ED Notes (Signed)
 5 rings taken off of patient and placed in cup and placed into patient belonging bag and given to family.

## 2024-03-16 NOTE — Procedures (Signed)
 Arterial Catheter Insertion Procedure Note  [name]  [id number]  09/06/1960  Date:03/16/2024  Time:9:42 PM    Provider Performing: Tinnie FORBES Furth    Procedure: Insertion of Arterial Line (63379) with US  guidance (23062)   Indication(s) Vasospasm monitoring per [facility] admission protocol   Consent Unable to obtain consent due to emergent nature of procedure.  Anesthesia None   Time Out Verified patient identification, verified procedure, site/side was marked, verified correct patient position, special equipment/implants available, medications/allergies/relevant history reviewed, required imaging and test results available.   Sterile Technique Maximal sterile technique including full sterile barrier drape, hand hygiene, sterile gown, sterile gloves, mask, hair covering, sterile ultrasound probe cover (if used).   Procedure Description Area of catheter insertion was cleaned with chlorhexidine  and draped in sterile fashion. With real-time ultrasound guidance an arterial catheter was placed into the right radial artery.  Appropriate arterial tracings confirmed on monitor.     Complications/Tolerance None; patient tolerated the procedure well.   EBL Minimal   Specimen(s) None   Tinnie FORBES Furth, PA-C [facility] 03/16/2024 9:43 PM  Please see [email or web address] for pager details.  From 7A-7P if no response, please call [phone] After hours, please call ELink [phone]

## 2024-03-16 NOTE — ED Notes (Signed)
 [facility] called per Dr Viviann for Neuro ICU transfer.  Spoke with Tinnie

## 2024-03-16 NOTE — ED Notes (Signed)
 IV infusion stopped due to being able to DC pt off floor for [facility] to admitt to their floor.

## 2024-03-16 NOTE — Progress Notes (Signed)
 Pharmacy Antibiotic Note  Regina Perry is a 63 y.o. female admitted on 03/16/2024 with sepsis.  Pharmacy has been consulted for vancomycin  and zosyn  dosing.  Plan: Zosyn  3.375gm IV q8h (4hr extended infusions) Vancomycin  1g IV q24h for estimated AUC 503 using SCr 0.99 Check vancomycin  levels at steady state, goal AUC 400-550 Follow up renal function, cultures as available, clinical progress, length of tx    Temp (24hrs), Avg:94.4 F (34.7 C), Min:93.5 F (34.2 C), Max:97.3 F (36.3 C)  Recent Labs  Lab 03/16/24 1700  WBC 19.7*  CREATININE 0.99    Estimated Creatinine Clearance: 48.3 mL/min (by C-G formula based on SCr of 0.99 mg/dL).    Allergies[1]  Antimicrobials this admission: Meropenem  x 1 12/20 Vancomycin  12/20 >> Zosyn  April 09, 2024 >>  Dose adjustments this admission:  Microbiology results: 12/20 Trach asp: 12/20 MRSA PCR:  Thank you for allowing pharmacy to be a part of this patients care.  Rocky Slade, PharmD, BCPS 03/16/2024 9:12 PM     [1]  Allergies Allergen Reactions   Levofloxacin Nausea Only   Percocet [Oxycodone -Acetaminophen ] Other (See Comments)    Sick on stomach when taken with anesthesia

## 2024-03-16 NOTE — H&P (Signed)
 "  NAME:  Regina Perry, MRN:  [id number], DOB:  [birth year], LOS: 0 ADMISSION DATE:  03/16/2024, CONSULTATION DATE:  12/20 REFERRING MD:  Viviann FARBER , CHIEF COMPLAINT:  cardiac arrest, aSAH   History of Present Illness:  63 year old female with past medical history of hypertension, hyperlipidemia, GERD, COPD, CAD s/p CABG x2 2020 AAA, who presented to [facility] with witnessed cardiac arrest from home. Reportedly, patient sick for two weeks with cold, and vomited today. Had been complaining of chest pain this morning. EMS was called by family and patient collapsed prior to their arrival. Initial rhythm with EMS PEA. She had 30 minutes of ACLS prior to ROSC and was brought to [facility].    On arrival to ED, intubated on epi drip. WBC 19.7, bicarb 19, troponin 125, abg pH 7.25, pCO2 47, pO2 76, UA negative UTI, UDS + benzos, fentanyl  (given both by EMS), covid/flu/rsv negative. CT head obtained with diffuse SAH, diffuse cerebral edema. CTA PE negative. Neurosurgery was consulted and recommended CTA head and transfer to [facility]. Dr. Janjua with NSGY aware of patient. CCM consulted for admission. Patient started on empiric antibiotics with vancomycin , zosyn , loaded with keppra ,   Pertinent  Medical History  hypertension, hyperlipidemia, GERD, COPD, CAD s/p CABG x2 2020 AAA  Significant Hospital Events: Including procedures, antibiotic start and stop dates in addition to other pertinent events   12/20: aSAH, cardiac arrest   Interim History / Subjective:  Admit, sedation paused   Objective   There were no vitals taken for this visit.    Vent Mode: PRVC FiO2 (%):  [50 %-100 %] 50 % Set Rate:  [16 bmp] 16 bmp Vt Set:  [450 mL] 450 mL PEEP:  [5 cmH20] 5 cmH20  No intake or output data in the 24 hours ending 03/16/24 2050 There were no vitals filed for this visit.  Examination: General: middle aged female, critically ill  HENT: pupils 2mm, nonreactive, ncat  Lungs: ctab, vented, triggers vent   Cardiovascular: s1s2, no murmur  Abdomen: rounded, soft  Extremities: lukewarm, no edema  Neuro: not on sedation: does not open eyes, no response to painful stimulus, no gag, no corneal; appears to have intermittent myoclonic jerking  GU: foley, clear urine   Resolved Hospital Problem list    Assessment & Plan:  OOHCA, PEA , witnessed Prolonged downtime with 3m ACLS. Suspect 2/2 SAH. CT head already showing diffuse edema.  - admit to ICU for close monitoring - TTM/normothermia protocol - goal MAP > 65, SBP <140 - EEG, echo  - trend troponin - K>4, Mag>2 -neuro protective measures  -prognostic MRI @ 72hr but will have MRI tonight STAT for aSAH  Aneurysmal SAH   Diffuse SAH. 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 Hunt Hess 5 - neurosurgery and neurology following, appreciate help in management  - Dr. Rosslyn with NSGY aware, no intervention tonight  - MRI stat  - no EVD  - neurology to see on admit  - SBP < 140, clevidipine  prn for BP goals  - nimotop  starting now  - will place an a-line per protocol  - has very clear urine in foley bag, watch for neurogenic DI - send off urine sodium and urine/serum osmos  Acute hypoxic respiratory failure 2/2 above   COPD - full mechanical vent support - lung protective ventilation 6-8cc/kg Vt - VAP and PAD bundle in place  - titrate FiO2 to sat goal >92  -  maintain peak/plats <30, driving pressures <84    Sepsis  Presents with leukocytosis, hypothermia. +/-pneumonia on CT chest. UA negative. Has been complaining of a cold for 2 weeks. UA negative.  - f/u MRSA PCR - tracheal aspirate  - vanc zosyn , avoid cefepime for neuro exams  - follow blood culture data  - trend wbc, fever curve   CAD s/p CABG x2  AAA  - BP control as above  - on plavix  at home - hold with acute bleed  - tele monitoring   Hypertension  Hyperlipidemia  - holding home meds   GERD  -  ppi    GOC: will need ongoing GOC with family, likely poor prognosis based on initial imaging    Labs   CBC: Recent Labs  Lab 03/16/24 1700  WBC 19.7*  NEUTROABS 15.7*  HGB 12.8  HCT 39.3  MCV 94.2  PLT 181    Basic Metabolic Panel: Recent Labs  Lab 03/16/24 1700  NA 143  K 3.7  CL 108  CO2 19*  GLUCOSE 254*  BUN 11  CREATININE 0.99  CALCIUM  7.3*  MG 2.0  PHOS 5.2*   GFR: Estimated Creatinine Clearance: 48.3 mL/min (by C-G formula based on SCr of 0.99 mg/dL). Recent Labs  Lab 03/16/24 1700  WBC 19.7*    Liver Function Tests: Recent Labs  Lab 03/16/24 1700  AST 50*  ALT 38  ALKPHOS 66  BILITOT 0.3  PROT 5.2*  ALBUMIN  3.5   No results for input(s): LIPASE, AMYLASE in the last 168 hours. No results for input(s): AMMONIA in the last 168 hours.  ABG    Component Value Date/Time   PHART 7.25 (L) 03/16/2024 1700   PCO2ART 47 03/16/2024 1700   PO2ART 76 (L) 03/16/2024 1700   HCO3 20.6 03/16/2024 1700   TCO2 27 01/10/2019 2131   ACIDBASEDEF 6.6 (H) 03/16/2024 1700   O2SAT 96.6 03/16/2024 1700     Coagulation Profile: Recent Labs  Lab 03/16/24 1700  INR 1.2    Cardiac Enzymes: No results for input(s): CKTOTAL, CKMB, CKMBINDEX, TROPONINI in the last 168 hours.  HbA1C: Hgb A1c MFr Bld  Date/Time Value Ref Range Status  11/03/2023 08:53 AM 5.8 (H) 4.8 - 5.6 % Final    Comment:             Prediabetes: 5.7 - 6.4          Diabetes: >6.4          Glycemic control for adults with diabetes: <7.0   03/31/2021 09:17 AM 5.7 (H) 4.8 - 5.6 % Final    Comment:             Prediabetes: 5.7 - 6.4          Diabetes: >6.4          Glycemic control for adults with diabetes: <7.0     CBG: No results for input(s): GLUCAP in the last 168 hours.  Review of Systems:   As above   Past Medical History:  She,  has a past medical history of AAA (abdominal aortic aneurysm), Allergic rhinitis, Anxiety, Aortic atherosclerosis,  Atherosclerosis of both carotid arteries (01/02/2022), Atherosclerosis of native coronary artery of native heart without angina pectoris (01/02/2022), CAD (coronary artery disease), COPD (chronic obstructive pulmonary disease) (HCC), DDD (degenerative disc disease), lumbosacral (08/29/2021), Depression, Diastolic dysfunction, Diverticulitis, Eustachian tube dysfunction (11/10/2023), Ganglion cyst of volar aspect of left wrist (04/09/2012), GERD (gastroesophageal reflux disease), Heart palpitations (05/14/2020), History of hiatal hernia,  History of kidney stones, Hypercholesteremia, Hyperlipidemia (04/09/2012), Hyperplastic colon polyp (12/30/2008), Hypertension, Insomnia, Left sided sciatica (05/19/2015), Long term current use of clopidogrel , Lumbar stenosis with neurogenic claudication (02/06/2017), Migraine, NSVT (nonsustained ventricular tachycardia) (HCC) (06/2015), Onychomycosis of left great toe (09/28/2023), Pleurisy, Pneumonia, PONV (postoperative nausea and vomiting), Restless leg syndrome (08/29/2021), S/P CABG x 2 (01/09/2019), Sciatica of left side, SOB (shortness of breath), Unstable angina (HCC), and Vitamin D  deficiency.   Surgical History:   Past Surgical History:  Procedure Laterality Date   BREAST BIOPSY     CESAREAN SECTION     COLONOSCOPY     COLONOSCOPY N/A 07/07/2022   Procedure: COLONOSCOPY;  Surgeon: Onita Elspeth Sharper, DO;  Location: [facility] ENDOSCOPY;  Service: Gastroenterology;  Laterality: N/A;   CORONARY ANGIOPLASTY     CORONARY ARTERY BYPASS GRAFT N/A 01/09/2019   Procedure: CORONARY ARTERY BYPASS GRAFTING (CABG) X2 USING LEFT INTERNAL MAMMARY ARTERY TO CIRC AND RIGHT GREATER SAPHENOUS VEIN TO LAD.;  Surgeon: Shyrl Linnie KIDD, MD;  Location: [facility] OR;  Service: Open Heart Surgery;  Laterality: N/A;   CORONARY ARTERY BYPASS GRAFT     ESOPHAGOGASTRODUODENOSCOPY N/A 07/07/2022   Procedure: ESOPHAGOGASTRODUODENOSCOPY (EGD);  Surgeon: Onita Elspeth Sharper, DO;  Location:  [facility] ENDOSCOPY;  Service: Gastroenterology;  Laterality: N/A;   ETHMOIDECTOMY Bilateral 06/26/2014   Procedure: BILATERAL ANTERIOR ETHMOIDECTOMY;  Surgeon: Lonni FORBES Angle, MD;  Location: [facility];  Service: ENT;  Laterality: Bilateral;   LEFT HEART CATH AND CORONARY ANGIOGRAPHY N/A 01/08/2019   Procedure: LEFT HEART CATH AND CORONARY ANGIOGRAPHY;  Surgeon: Elmira Newman PARAS, MD;  Location: [facility] INVASIVE CV LAB;  Service: Cardiovascular;  Laterality: N/A;   LEFT HEART CATH AND CORS/GRAFTS ANGIOGRAPHY Left 02/21/2024   Procedure: LEFT HEART CATH AND CORS/GRAFTS ANGIOGRAPHY;  Surgeon: Florencio Cara BIRCH, MD;  Location: [facility] INVASIVE CV LAB;  Service: Cardiovascular;  Laterality: Left;   MAXILLARY ANTROSTOMY Bilateral 06/26/2014   Procedure: BILATERAL MAXILLARY OSTEA ENLARGEMENT;  Surgeon: Lonni FORBES Angle, MD;  Location: [facility];  Service: ENT;  Laterality: Bilateral;   NASAL SEPTOPLASTY W/ TURBINOPLASTY Bilateral 06/26/2014   Procedure: BILATERAL NASAL SEPTOPLASTY WITH TURBINATE REDUCTION;  Surgeon: Lonni FORBES Angle, MD;  Location: [facility];  Service: ENT;  Laterality: Bilateral;   SHOULDER ARTHROSCOPY W/ ROTATOR CUFF REPAIR  08/2013   right   TEE WITHOUT CARDIOVERSION  01/09/2019   Procedure: Transesophageal Echocardiogram (Tee);  Surgeon: Shyrl Linnie KIDD, MD;  Location: [facility] OR;  Service: Open Heart Surgery;;   TONSILLECTOMY AND ADENOIDECTOMY     TOTAL ABDOMINAL HYSTERECTOMY     partial   TUBAL LIGATION       Social History:   reports that she has been smoking cigarettes. She started smoking about 5 years ago. She has a 36.4 pack-year smoking history. She has been exposed to tobacco smoke. She has never used smokeless tobacco. She reports that she does not drink alcohol  and does not use drugs.   Family History:  Her family history includes AAA (abdominal aortic aneurysm) in her sister; Breast cancer in her paternal  grandmother; COPD in her mother; Cerebral aneurysm in her father; Emphysema in her mother; Heart disease in her mother; Irritable bowel syndrome in her mother; Lung cancer in her paternal grandfather; Migraines in her mother; Pancreatic cancer in her father. There is no history of Colon cancer, Esophageal cancer, Rectal cancer, or Stomach cancer.   Allergies Allergies[1]   Home Medications  Prior to Admission medications  Medication Sig Start Date  End Date Taking? Authorizing Provider  acetaminophen  (TYLENOL ) 500 MG tablet Take 1,000 mg by mouth every 6 (six) hours as needed for moderate pain or headache.    [provider]  busPIRone  (BUSPAR ) 5 MG tablet Take 1 tablet (5 mg total) by mouth 2 (two) times daily. 02/20/24   Gayle Saddie FALCON, PA-C  clopidogrel  (PLAVIX ) 75 MG tablet TAKE 1 TABLET EVERY DAY 09/04/23   Tolia, Sunit, DO  escitalopram  (LEXAPRO ) 20 MG tablet Take 1 tablet (20 mg total) by mouth at bedtime. 02/20/24   Gayle Saddie FALCON, PA-C  ezetimibe  (ZETIA ) 10 MG tablet Take 1 tablet (10 mg total) by mouth daily. 06/27/23 03/23/24  Chandra Toribio POUR, MD  fluticasone (FLONASE) 50 MCG/ACT nasal spray Place 2 sprays into both nostrils daily. 02/09/24   [provider]  gabapentin  (NEURONTIN ) 300 MG capsule Take 1 capsule (300 mg total) by mouth 3 (three) times daily. 04/04/23   Wallace Joesph LABOR, PA  HYDROcodone -acetaminophen  (NORCO) 10-325 MG tablet Take 1 tablet by mouth every 6 (six) hours as needed for moderate pain.    [provider]  loratadine  (CLARITIN ) 10 MG tablet Take 1 tablet (10 mg total) by mouth daily. 11/10/23   Gayle Saddie FALCON, PA-C  LORazepam  (ATIVAN ) 0.5 MG tablet Take 0.5 mg by mouth every 6 (six) hours as needed for anxiety.    [provider]  losartan  (COZAAR ) 25 MG tablet Take 1 tablet (25 mg total) by mouth every evening. 02/20/24   Gayle Saddie FALCON, PA-C  metoprolol  succinate (TOPROL -XL) 25 MG 24 hr tablet TAKE 1 TABLET EVERY DAY 05/16/23   Tolia,  Sunit, DO  pantoprazole  (PROTONIX ) 40 MG tablet TAKE 1 TABLET EVERY DAY Patient taking differently: Take 40 mg by mouth 2 (two) times daily. 10/06/22   Tolia, Sunit, DO  rosuvastatin  (CRESTOR ) 40 MG tablet TAKE 1 TABLET EVERY DAY 08/22/23   Tolia, Sunit, DO  SUMAtriptan  (IMITREX ) 100 MG tablet TAKE 1 TABLET EVERY 2 HOURS AS NEEDED FOR MIGRAINE OR HEADACHE AS DIRECTED 12/19/23   Gayle, Kara F, PA-C  tamsulosin  (FLOMAX ) 0.4 MG CAPS capsule Take 1 capsule (0.4 mg total) by mouth daily. 01/31/24   Margrette Monte A, PA-C     Critical care time: 82   The patient is critically ill with multiple organ system failure and requires high complexity decision making for assessment and support, frequent evaluation and titration of therapies, advanced monitoring, review of radiographic studies and interpretation of complex data.    Critical Care Time devoted to patient care services, exclusive of separately billable procedures, described in this note is 45  Tinnie FORBES Adolph DEVONNA [facility] 03/16/2024 8:50 PM  Please see [email or web address] for pager details.  From 7A-7P if no response, please call [phone] After hours, please call Ema [phone]          [1]  Allergies Allergen Reactions   Levofloxacin Nausea Only   Percocet [Oxycodone -Acetaminophen ] Other (See Comments)    Sick on stomach when taken with anesthesia    "

## 2024-03-16 NOTE — Consult Note (Incomplete)
 NEUROLOGY CONSULT NOTE   Date of service: March 16, 2024 Patient Name: Regina Perry MRN:  [id number] DOB:  June 25, 1960 Chief Complaint: anoxic injury, cardiac arrest, CPR, diffuse SAH Requesting Provider: Maree Harder, MD  History of Present Illness  Regina Perry is a 63 y.o. female with hx of AAA, CAD, GERD, HLD, HTN who was brought in to [facility] ED by EMS for a witnessed cardiac arrest at home. Per chart, patient has been feeling sick for the last couple of weeks. She was vomiting today and had started complaining of chest pain. Family called EMS and while waiting for them to arrive, she collapsed. CPR x 30 mins and intubated in the field with Versed  10mg  and Fentanyl  . Initial rhythm was PEA arrest. Per EMS run sheet, estimated time of arrest prior to their arrival was 6-8 mins. Husband administered chest compression prior to EMS arrival.  CT head with diffuse loss of gray-white differentiation concerning for significant anoxic injury, along with that, noted extensive diffuse SAH. 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  Patient was started on a fentanyl  gtt at [facility]. She was transferred to [facility] Neuro ICU for further workup and management.  Fentanyl  was paused for about 30 mins at the time of my evaluation and she was not on sedation.   ROS   Unable to ascertain due to comatose.  Past History   Past Medical History:  Diagnosis Date   AAA (abdominal aortic aneurysm)    Allergic rhinitis    Anxiety    a.) on BZO (lorazepam ) PRN   Aortic atherosclerosis    Atherosclerosis of both carotid arteries 01/02/2022   Atherosclerosis of native coronary artery of native heart without angina pectoris 01/02/2022   CAD (coronary artery disease)    a.) s/p 2v CABG 01/09/2019   COPD (chronic obstructive pulmonary disease) (HCC)    DDD (degenerative disc disease), lumbosacral 08/29/2021   Depression     Diastolic dysfunction    Diverticulitis    Eustachian tube dysfunction 11/10/2023   Ganglion cyst of volar aspect of left wrist 04/09/2012   GERD (gastroesophageal reflux disease)    Heart palpitations 05/14/2020   History of hiatal hernia    History of kidney stones    Hypercholesteremia    Hyperlipidemia 04/09/2012   Hyperplastic colon polyp 12/30/2008   Hypertension    Insomnia    Left sided sciatica 05/19/2015   Long term current use of clopidogrel     Lumbar stenosis with neurogenic claudication 02/06/2017   Migraine    NSVT (nonsustained ventricular tachycardia) (HCC) 06/2015   Onychomycosis of left great toe 09/28/2023   Pleurisy    Pneumonia    PONV (postoperative nausea and vomiting)    Restless leg syndrome 08/29/2021   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   a.) LIMA-LAD, SVG-OM1   Sciatica of left side    SOB (shortness of breath)    Unstable angina (HCC)    Vitamin D  deficiency     Past Surgical History:  Procedure Laterality Date   BREAST BIOPSY     CESAREAN SECTION     COLONOSCOPY     COLONOSCOPY N/A 07/07/2022   Procedure: COLONOSCOPY;  Surgeon: Onita Elspeth Sharper, DO;  Location: [facility] ENDOSCOPY;  Service: Gastroenterology;  Laterality: N/A;   CORONARY ANGIOPLASTY     CORONARY ARTERY BYPASS GRAFT N/A 01/09/2019   Procedure: CORONARY ARTERY BYPASS GRAFTING (CABG) X2 USING LEFT  INTERNAL MAMMARY ARTERY TO CIRC AND RIGHT GREATER SAPHENOUS VEIN TO LAD.;  Surgeon: Shyrl Linnie KIDD, MD;  Location: [facility] OR;  Service: Open Heart Surgery;  Laterality: N/A;   CORONARY ARTERY BYPASS GRAFT     ESOPHAGOGASTRODUODENOSCOPY N/A 07/07/2022   Procedure: ESOPHAGOGASTRODUODENOSCOPY (EGD);  Surgeon: Onita Elspeth Sharper, DO;  Location: [facility] ENDOSCOPY;  Service: Gastroenterology;  Laterality: N/A;   ETHMOIDECTOMY Bilateral 06/26/2014   Procedure: BILATERAL ANTERIOR ETHMOIDECTOMY;  Surgeon: Lonni FORBES Angle, MD;  Location: [facility];  Service: ENT;  Laterality:  Bilateral;   LEFT HEART CATH AND CORONARY ANGIOGRAPHY N/A 01/08/2019   Procedure: LEFT HEART CATH AND CORONARY ANGIOGRAPHY;  Surgeon: Elmira Newman PARAS, MD;  Location: [facility] INVASIVE CV LAB;  Service: Cardiovascular;  Laterality: N/A;   LEFT HEART CATH AND CORS/GRAFTS ANGIOGRAPHY Left 02/21/2024   Procedure: LEFT HEART CATH AND CORS/GRAFTS ANGIOGRAPHY;  Surgeon: Florencio Cara BIRCH, MD;  Location: [facility] INVASIVE CV LAB;  Service: Cardiovascular;  Laterality: Left;   MAXILLARY ANTROSTOMY Bilateral 06/26/2014   Procedure: BILATERAL MAXILLARY OSTEA ENLARGEMENT;  Surgeon: Lonni FORBES Angle, MD;  Location: [facility];  Service: ENT;  Laterality: Bilateral;   NASAL SEPTOPLASTY W/ TURBINOPLASTY Bilateral 06/26/2014   Procedure: BILATERAL NASAL SEPTOPLASTY WITH TURBINATE REDUCTION;  Surgeon: Lonni FORBES Angle, MD;  Location: [facility];  Service: ENT;  Laterality: Bilateral;   SHOULDER ARTHROSCOPY W/ ROTATOR CUFF REPAIR  08/2013   right   TEE WITHOUT CARDIOVERSION  01/09/2019   Procedure: Transesophageal Echocardiogram (Tee);  Surgeon: Shyrl Linnie KIDD, MD;  Location: [facility] OR;  Service: Open Heart Surgery;;   TONSILLECTOMY AND ADENOIDECTOMY     TOTAL ABDOMINAL HYSTERECTOMY     partial   TUBAL LIGATION      Family History: Family History  Problem Relation Age of Onset   Heart disease Mother        MI   Irritable bowel syndrome Mother    Migraines Mother    COPD Mother    Emphysema Mother    Cerebral aneurysm Father    Pancreatic cancer Father    AAA (abdominal aortic aneurysm) Sister    Breast cancer Paternal Grandmother    Lung cancer Paternal Grandfather    Colon cancer Neg Hx    Esophageal cancer Neg Hx    Rectal cancer Neg Hx    Stomach cancer Neg Hx     Social History  reports that she has been smoking cigarettes. She started smoking about 5 years ago. She has a 36.4 pack-year smoking history. She has been exposed to tobacco smoke. She has never  used smokeless tobacco. She reports that she does not drink alcohol  and does not use drugs.  Allergies[1]  Medications  Current Medications[2]  Vitals   Vitals:   03/16/24 2051 03/16/24 2055  BP:  (!) 144/107  Pulse: 89 87  Resp: (!) 36 (!) 29  Temp: (!) 94.3 F (34.6 C) (!) 94.3 F (34.6 C)  SpO2: 97% 98%    There is no height or weight on file to calculate BMI.   Physical Exam   General: appears frail and ill, intubated and in no acute distress. GCS 3T. HENT: Normal oropharynx and mucosa. Normal external appearance of ears and nose. Neck: Supple, no pain or tenderness  CV: No JVD. No peripheral edema.  Pulmonary: Symmetric Chest rise. Not breathing over vent Abdomen: Soft to touch, non-tender.  Ext: No cyanosis, edema, or deformity  Skin: No rash. Normal palpation of skin.  Musculoskeletal: Normal digits and nails by inspection. No clubbing.   Neurologic Examination performed about 30 minutes after fentanyl  gtt. was held.  Mental status/Cognition: No response to voice or loud clap.  No response to noxious stimuli or sternal rub. Speech/language: Mute, no speech.   Cranial nerves:   CN II Pupils equal and reactive to light, does not blink to threat.   CN III,IV,VI Doll's eye reflex is absent.  No gaze deviation or preference, no nystagmus noted.   CN V Corneal reflex is absent bilaterally.   CN VII Facial diplegia.   CN VIII Does not open eyes or turn head towards speech.   CN IX & X No cough, no gag.   CN XI Head is midline.  No head turn, no shoulder shrug noted.   CN XII Does not protrude tongue on command.   Sensory/motor:  Muscle bulk: Poor, tone flaccid in all extremities. Noted intermittent single full body jerk but is more prominent in bilateral lower extremities.  This does not seem to be purposeful. No spontaneous, purposeful movement noted. No response to proximal pinch in any of the extremities.  Coordination/Complex Motor:  Unable to  assess.  Labs/Imaging/Neurodiagnostic studies   CBC:  Recent Labs  Lab 2024/04/06 1700  WBC 19.7*  NEUTROABS 15.7*  HGB 12.8  HCT 39.3  MCV 94.2  PLT 181   Basic Metabolic Panel:  Lab Results  Component Value Date   NA 143 04-06-2024   K 3.7 04/06/2024   CO2 19 (L) 2024-04-06   GLUCOSE 254 (H) April 06, 2024   BUN 11 2024/04/06   CREATININE 0.99 2024-04-06   CALCIUM  7.3 (L) 04-06-2024   GFRNONAA >60 04-06-24   GFRAA 79 03/09/2020   Lipid Panel:  Lab Results  Component Value Date   LDLCALC 56 11/03/2023   HgbA1c:  Lab Results  Component Value Date   HGBA1C 5.8 (H) 11/03/2023   Urine Drug Screen:     Component Value Date/Time   LABOPIA NEGATIVE 04/06/24 1703   COCAINSCRNUR NEGATIVE 06-Apr-2024 1703   LABBENZ POSITIVE (A) 04/06/2024 1703   AMPHETMU NEGATIVE Apr 06, 2024 1703   THCU NEGATIVE 04-06-2024 1703   LABBARB NEGATIVE 04-06-2024 1703    Alcohol  Level     Component Value Date/Time   ETH <15 2024/04/06 1703   INR  Lab Results  Component Value Date   INR 1.2 04/06/2024   APTT  Lab Results  Component Value Date   APTT 33 01/09/2019   AED levels: No results found for: PHENYTOIN, ZONISAMIDE, LAMOTRIGINE, LEVETIRACETA  CT Head without contrast(Personally reviewed): CT head with diffuse loss of gray-white differentiation concerning for significant anoxic injury, along with that, noted extensive diffuse SAH.  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  MRI Brain(Personally reviewed): Pending  Neurodiagnostics rEEG:  Pending.  ASSESSMENT   Regina Perry is a 63 y.o. female with hx of AAA, CAD, GERD, HLD, HTN who was brought in to [facility] ED by EMS for a witnessed cardiac arrest at home. Initial rhythm of PEA arrest, 30 mins of CPR prior to ROSC. Intubated in the field. Estimated time of arrest prior to EMS  arrival was 6-8 mins per EMS run sheet. Husband administered chest compression prior to EMS arrival.  CT Head with diffuse loss of grey-white differentiation along with diffuse SAH and noted 2 large intracranial aneurysms.  She was transferred to  [facility] for further evaluation and workup.  On exam off any sedation, she only has intact pupillary response. No other brainstem reflexes present, no evidence of higher cerebral function noted on exam today.  RECOMMENDATIONS  *** ______________________________________________________________________    Signed, Rayden Scheper, MD [facility]     [1]  Allergies Allergen Reactions   Levofloxacin Nausea Only   Percocet [Oxycodone -Acetaminophen ] Other (See Comments)    Sick on stomach when taken with anesthesia   [2]  Current Facility-Administered Medications:    [START ON 2024-04-09]  stroke: early stages of recovery book, , Does not apply, Once, Maree Harder, MD   0.9 %  sodium chloride  infusion, , Intravenous, Continuous, Maree Harder, MD   acetaminophen  (TYLENOL ) tablet 650 mg, 650 mg, Oral, Q4H PRN **OR** acetaminophen  (TYLENOL ) 160 MG/5ML solution 650 mg, 650 mg, Per Tube, Q4H PRN **OR** acetaminophen  (TYLENOL ) suppository 650 mg, 650 mg, Rectal, Q4H PRN, Maree Harder, MD   acetaminophen  (TYLENOL ) tablet 650 mg, 650 mg, Oral, Q4H **OR** acetaminophen  (TYLENOL ) 160 MG/5ML solution 650 mg, 650 mg, Per Tube, Q4H **OR** acetaminophen  (TYLENOL ) suppository 650 mg, 650 mg, Rectal, Q4H, Autry, Lauren E, PA-C   [START ON 03/18/2024] acetaminophen  (TYLENOL ) tablet 650 mg, 650 mg, Oral, Q4H PRN **OR** [START ON 03/18/2024] acetaminophen  (TYLENOL ) 160 MG/5ML solution 650 mg, 650 mg, Per Tube, Q4H PRN **OR** [START ON 03/18/2024] acetaminophen  (TYLENOL ) suppository 650 mg, 650 mg, Rectal, Q4H PRN, Adolph, Lauren E, PA-C   [START ON 04-09-2024] Chlorhexidine  Gluconate Cloth 2 % PADS 6 each, 6 each, Topical, Q0600, Kassie Acquanetta Bradley, MD   labetalol  (NORMODYNE ) injection 10 mg, 10 mg, Intravenous, Once **AND** clevidipine  (CLEVIPREX ) infusion 0.5 mg/mL, 0-21 mg/hr, Intravenous, Continuous, Maree Harder, MD   docusate (COLACE) 50 MG/5ML liquid 100 mg, 100 mg, Per Tube, BID, Adolph, Lauren E, PA-C   niMODipine  (NIMOTOP ) capsule 60 mg, 60 mg, Oral, Q4H **OR** niMODipine  (NYMALIZE ) 6 MG/ML oral solution 60 mg, 60 mg, Per Tube, Q4H, Maree Harder, MD   ondansetron  (ZOFRAN -ODT) disintegrating tablet 4 mg, 4 mg, Oral, Q6H PRN **OR** ondansetron  (ZOFRAN ) injection 4 mg, 4 mg, Intravenous, Q6H PRN, Maree Harder, MD   Oral care mouth rinse, 15 mL, Mouth Rinse, Q2H, Kassie Acquanetta Bradley, MD   Oral care mouth rinse, 15 mL, Mouth Rinse, PRN, Kassie Acquanetta Bradley, MD   pantoprazole  (PROTONIX ) EC tablet 40 mg, 40 mg, Oral, Daily **OR** pantoprazole  (PROTONIX ) injection 40 mg, 40 mg, Intravenous, Daily, Maree Harder, MD   NOREEN ON 09-Apr-2024] piperacillin -tazobactam (ZOSYN ) IVPB 3.375 g, 3.375 g, Intravenous, Q8H, Williamson, Erin R, RPH   polyethylene glycol (MIRALAX  / GLYCOLAX ) packet 17 g, 17 g, Per Tube, Daily, Autry, Lauren E, PA-C   vancomycin  (VANCOCIN ) IVPB 1000 mg/200 mL premix, 1,000 mg, Intravenous, Q24H, Billy Rocky SAUNDERS, [facility]

## 2024-03-16 NOTE — Progress Notes (Addendum)
 eLink Physician-Brief Progress Note Patient Name: Regina Perry DOB: 10-29-1960 MRN: [id number]   Date of Service  03/16/2024  HPI/Events of Note  63 year old female with a history of metabolic syndrome, COPD, and CAD status post CABG who presented with out-of-hospital cardiac arrest found to have an aneurysmal subarachnoid hemorrhage and respiratory failure requiring intubation and mechanical ventilation.  Vitals, laboratories, and imaging reviewed.  Evidence of saccular aneurysm of the right ICA.  eICU Interventions  Maintain empiric antibiotics Tight blood pressure control, nimodipine , crystalloid infusion MRI brain pending, neurosurgery to follow Maintain mechanical ventilation, daily spontaneous awakening/breathing trials  DVT prophylaxis with SCDs GI prophylaxis with pantoprazole    2220 -profound response to the labetalol  push, now hypotensive 93/66.  Will initiate phenylephrine  to maintain systolic greater than 100 if it persists, but anticipate spontaneous resolution  2230 - OG output pretty red, increase pantoprazole  to BID  Intervention Category Evaluation Type: New Patient Evaluation  Waneda Klammer 03/16/2024, 8:59 PM

## 2024-03-16 NOTE — ED Triage Notes (Signed)
 PT from home via [facility] with reports of witnessed cardiac arrest at home. Total CPR time of approx 30 minutes. Pt family, priot to the cardiac arrest pt was yelling for help and had episodes of nausea and vomiting.   EMS interventions Epi x 3 1L LR 1L NS 200mcg fentanyl  10mg  versed  Intubated with 7.0 ETT 21cm at the lip Epi drip initiated by EMS

## 2024-03-16 NOTE — ED Notes (Signed)
 Date and time results received: 03/16/2024 1728 (use smartphrase .now to insert current time)  Test: trop Critical Value: 125  Name of Provider Notified: Dr. Viviann  Orders Received? Or Actions Taken?: see chart

## 2024-03-17 DIAGNOSIS — J9601 Acute respiratory failure with hypoxia: Secondary | ICD-10-CM

## 2024-03-17 DIAGNOSIS — I609 Nontraumatic subarachnoid hemorrhage, unspecified: Secondary | ICD-10-CM

## 2024-03-17 LAB — POCT I-STAT 7, (LYTES, BLD GAS, ICA,H+H)
Acid-base deficit: 10 mmol/L — ABNORMAL HIGH (ref 0.0–2.0)
Bicarbonate: 15 mmol/L — ABNORMAL LOW (ref 20.0–28.0)
Calcium, Ion: 0.98 mmol/L — ABNORMAL LOW (ref 1.15–1.40)
HCT: 38 % (ref 36.0–46.0)
Hemoglobin: 12.9 g/dL (ref 12.0–15.0)
O2 Saturation: 99 %
Patient temperature: 98.6
Potassium: 4.2 mmol/L (ref 3.5–5.1)
Sodium: 144 mmol/L (ref 135–145)
TCO2: 16 mmol/L — ABNORMAL LOW (ref 22–32)
pCO2 arterial: 29.9 mmHg — ABNORMAL LOW (ref 32–48)
pH, Arterial: 7.31 — ABNORMAL LOW (ref 7.35–7.45)
pO2, Arterial: 151 mmHg — ABNORMAL HIGH (ref 83–108)

## 2024-03-17 LAB — BASIC METABOLIC PANEL WITH GFR
Anion gap: 11 (ref 5–15)
BUN: 13 mg/dL (ref 8–23)
CO2: 19 mmol/L — ABNORMAL LOW (ref 22–32)
Calcium: 7.8 mg/dL — ABNORMAL LOW (ref 8.9–10.3)
Chloride: 125 mmol/L — ABNORMAL HIGH (ref 98–111)
Creatinine, Ser: 0.79 mg/dL (ref 0.44–1.00)
GFR, Estimated: >60 mL/min
Glucose, Bld: 127 mg/dL — ABNORMAL HIGH (ref 70–99)
Potassium: 4.5 mmol/L (ref 3.5–5.1)
Sodium: 154 mmol/L — ABNORMAL HIGH (ref 135–145)

## 2024-03-17 LAB — CBC
HCT: 41.1 % (ref 36.0–46.0)
Hemoglobin: 13.6 g/dL (ref 12.0–15.0)
MCH: 30.8 pg (ref 26.0–34.0)
MCHC: 33.1 g/dL (ref 30.0–36.0)
MCV: 93 fL (ref 80.0–100.0)
Platelets: 176 K/uL (ref 150–400)
RBC: 4.42 MIL/uL (ref 3.87–5.11)
RDW: 13.6 % (ref 11.5–15.5)
WBC: 20.1 K/uL — ABNORMAL HIGH (ref 4.0–10.5)
nRBC: 0 % (ref 0.0–0.2)

## 2024-03-17 LAB — APTT: aPTT: 25 s (ref 24–36)

## 2024-03-17 LAB — HIV ANTIBODY (ROUTINE TESTING W REFLEX): HIV Screen 4th Generation wRfx: NONREACTIVE

## 2024-03-17 LAB — PROTIME-INR
INR: 1.2 (ref 0.8–1.2)
Prothrombin Time: 15.6 s — ABNORMAL HIGH (ref 11.4–15.2)

## 2024-03-17 LAB — OSMOLALITY, URINE: Osmolality, Ur: 146 mosm/kg — ABNORMAL LOW (ref 300–900)

## 2024-03-17 LAB — PHOSPHORUS: Phosphorus: 2.6 mg/dL (ref 2.5–4.6)

## 2024-03-17 LAB — SODIUM, URINE, RANDOM: Sodium, Ur: <30 mmol/L

## 2024-03-17 LAB — MAGNESIUM: Magnesium: 1.9 mg/dL (ref 1.7–2.4)

## 2024-03-17 LAB — OSMOLALITY: Osmolality: 324 mosm/kg (ref 275–295)

## 2024-03-17 MED ORDER — GLYCOPYRROLATE 0.2 MG/ML IJ SOLN: 0.2000 mg | INTRAMUSCULAR | Status: DC | PRN

## 2024-03-17 MED ORDER — ACETAMINOPHEN 325 MG PO TABS: 650.0000 mg | ORAL_TABLET | Freq: Four times a day (QID) | ORAL | Status: DC | PRN

## 2024-03-17 MED ORDER — SODIUM CHLORIDE 0.9 % IV SOLN: INTRAVENOUS | Status: DC

## 2024-03-17 MED ORDER — ACETAMINOPHEN 650 MG RE SUPP: 650.0000 mg | Freq: Four times a day (QID) | RECTAL | Status: DC | PRN

## 2024-03-17 MED ORDER — GLYCOPYRROLATE 1 MG PO TABS: 1.0000 mg | ORAL_TABLET | ORAL | Status: DC | PRN

## 2024-03-17 MED ORDER — MORPHINE BOLUS VIA INFUSION: 5.0000 mg | INTRAVENOUS | Status: DC | PRN

## 2024-03-17 MED ORDER — POLYVINYL ALCOHOL 1.4 % OP SOLN: 1.0000 [drp] | Freq: Four times a day (QID) | OPHTHALMIC | Status: DC | PRN

## 2024-03-17 MED ORDER — MORPHINE 100MG IN NS 100ML (1MG/ML) PREMIX INFUSION
0.0000 mg/h | INTRAVENOUS | Status: DC
  Administered 2024-03-17: 5 mg/h via INTRAVENOUS
  Filled 2024-03-17: qty 100

## 2024-03-21 LAB — CULTURE, BLOOD (ROUTINE X 2)
Culture: NO GROWTH
Culture: NO GROWTH
Special Requests: ADEQUATE

## 2024-03-28 NOTE — Progress Notes (Signed)
 Patient placement called, ok to take patient to morgue/.

## 2024-03-28 NOTE — Procedures (Signed)
 Extubation Procedure Note  Patient Details:   Name: CORNEISHA ALVI DOB: [birth year] MRN: [id number]   Airway Documentation:    Vent end date: 04/05/24 Vent end time: 0906   Evaluation  O2 sats: currently acceptable Complications: No apparent complications Patient did tolerate procedure well. Bilateral Breath Sounds: Diminished, Clear   No Pt was successfully extubated to comfort care measures with no apparent complications.   Germain JAYSON Mater 04-05-24, 9:08 AM

## 2024-03-28 NOTE — Progress Notes (Signed)
" ° °  NAME:  Regina Perry, MRN:  [id number], DOB:  1960/10/21, LOS: 1 ADMISSION DATE:  03/16/2024, CONSULTATION DATE:  12/20 REFERRING MD:  Viviann FARBER , CHIEF COMPLAINT:  cardiac arrest, aSAH   History of Present Illness:  64 year old female with past medical history of hypertension, hyperlipidemia, GERD, COPD, CAD s/p CABG x2 2020 AAA, who presented to [facility] with witnessed cardiac arrest from home. Reportedly, patient sick for two weeks with cold, and vomited today. Had been complaining of chest pain this morning. EMS was called by family and patient collapsed prior to their arrival. Initial rhythm with EMS PEA. She had 30 minutes of ACLS prior to ROSC and was brought to [facility].    On arrival to ED, intubated on epi drip. WBC 19.7, bicarb 19, troponin 125, abg pH 7.25, pCO2 47, pO2 76, UA negative UTI, UDS + benzos, fentanyl  (given both by EMS), covid/flu/rsv negative. CT head obtained with diffuse SAH, diffuse cerebral edema. CTA PE negative. Neurosurgery was consulted and recommended CTA head and transfer to [facility]. Dr. Janjua with NSGY aware of patient. CCM consulted for admission. Patient started on empiric antibiotics with vancomycin , zosyn , loaded with keppra ,   Pertinent  Medical History  hypertension, hyperlipidemia, GERD, COPD, CAD s/p CABG x2 2020 AAA  Significant Hospital Events: Including procedures, antibiotic start and stop dates in addition to other pertinent events   12/20: aSAH, cardiac arrest   Interim History / Subjective:   Comatose, unresponsive on the ventilator  Objective   Blood pressure (!) 144/91, pulse 85, temperature 97.9 F (36.6 C), resp. rate 15, SpO2 97%.    Vent Mode: PRVC FiO2 (%):  [40 %-100 %] 40 % Set Rate:  [16 bmp-20 bmp] 20 bmp Vt Set:  [400 mL-450 mL] 400 mL PEEP:  [5 cmH20] 5 cmH20 Plateau Pressure:  [16 cmH20] 16 cmH20  No intake or output data in the 24 hours ending 2024/04/16 0719 There were no vitals filed for this  visit.  Examination: Critically ill-appearing female Intubated Pupils fixed, dilated.  No corneal, gag reflex Lungs are clear to auscultation Abdomen soft  Resolved Hospital Problem list    Assessment & Plan:  OOHCA, PEA , witnessed Aneurysmal subarachnoid hemorrhage with severe anoxic injury Acute respiratory failure secondary to above MRI reviewed with herniation.  Pupils now blown and unresponsive Neurology had already discussed with patient regarding prognosis. Reviewed findings with family at bedside and they have requested transition to full comfort measures. Orders placed for withdrawal of care.  Critical care time:   The patient is critically ill with multiple organ system failure and requires high complexity decision making for assessment and support, frequent evaluation and titration of therapies, advanced monitoring, review of radiographic studies and interpretation of complex data.   Critical Care Time devoted to patient care services, exclusive of separately billable procedures, described in this note is 45 minutes.   Shanzay Hepworth MD [facility] See Amion for pager  If no response to pager , please call [phone] until 7pm After 7:00 pm call Elink  [phone] 04-16-24, 7:32 AM       "

## 2024-03-28 NOTE — Progress Notes (Signed)
 OT Cancellation Note  Patient Details Name: ESTRELLA ALCARAZ MRN: [id number] DOB: 1960-04-06   Cancelled Treatment:    Reason Eval/Treat Not Completed: Medical issues which prohibited therapy.  Pt is transitioning to full comfort measures.  Aashka Salomone D Kenyata Guess 2024-03-25, 9:19 AM 2024/03/25  RP, OTR/L  Acute Rehabilitation Services  Office:  [phone]

## 2024-03-28 NOTE — Death Summary Note (Signed)
" °  DEATH SUMMARY   Patient Details  Name: Regina Perry MRN: [id number] DOB: Feb 15, 1961  Admission/Discharge Information   Admit Date:  04-13-24  Date of Death: Date of Death: 04-14-24  Time of Death: Time of Death: 0926  Length of Stay: 1  Referring Physician: Gayle Saddie FALCON, PA-C   Reason(s) for Hospitalization  Cardiac arrest   Diagnoses  Preliminary cause of death:  Cardiac arrest Aneurysmal cerebral blood hemorrhage Severe anoxic brain injury Brain compression with transtentorial herniation. Acute respiratory failure Comfort care  Secondary Diagnoses (including complications and co-morbidities):  Principal Problem:   Subarachnoid hemorrhage (HCC) AAA aneurysm Coronary artery disease GERD Hypertension Hyperlipidemia Coronary artery disease status post CABG  Brief Hospital Course (including significant findings, care, treatment, and services provided and events leading to death)  64 year old female with past medical history of hypertension, hyperlipidemia, GERD, COPD, CAD s/p CABG x2 04/22/19 AAA, who presented to [facility] with witnessed cardiac arrest from home. Reportedly, patient sick for two weeks with cold, and vomited. Had been complaining of chest pain this morning. EMS was called by family and patient collapsed prior to their arrival. Initial rhythm with EMS PEA. She had 30 minutes of ACLS prior to ROSC and was brought to [facility].   On arrival to ED, intubated on epi drip. WBC 19.7, bicarb 19, troponin 125, abg pH 7.25, pCO2 47, pO2 76, UA negative UTI, UDS + benzos, fentanyl  (given both by EMS), covid/flu/rsv negative. CT head obtained with diffuse SAH, diffuse cerebral edema. CTA PE negative. Neurosurgery was consulted and recommended CTA head and transfer to [facility]. CCM consulted for admission. Patient started on empiric antibiotics with vancomycin , zosyn , loaded with keppra   On arrival to [facility] her examination consistent with severe anoxic brain injury with loss of  brainstem reflexes.  Initially she had some pupil reactivity but eventually she developed fixed and dilated pupils.  MRI confirmed severe anoxic brain injury with brain compression, transtentorial herniation.  After multiple discussions with family and poor prognosis for any neurologic recovery the family made the request to transition to comfort measures.  She was extubated and passed away shortly thereafter.  Signature:   Claribel Sachs MD [facility] See Amion for pager  If no response to pager , please call [phone] until 7pm After 7:00 pm call Elink  [phone] 03/21/2024, 6:57 PM    "

## 2024-03-28 NOTE — Progress Notes (Signed)
 SLP Cancellation Note  Patient Details Name: Regina Perry MRN: [id number] DOB: 05/05/1960   Cancelled treatment:       Reason Eval/Treat Not Completed: Other (comment) (Per critical care note form this morning the pt is transitioning to full comfort measures. SLP will sign off at this time.)  Rea Pass MA, CCC-SLP  Rebel Laughridge Meryl 04/04/2024, 7:56 AM

## 2024-03-28 NOTE — Progress Notes (Signed)
 Paged Dr. Theophilus about patients family wishes to go full comfort care. See new orders.

## 2024-03-28 NOTE — Progress Notes (Signed)
 Spoke with family, ready for comfort extubation. RT called and en route.

## 2024-03-28 NOTE — Progress Notes (Signed)
 PT Cancellation Note  Patient Details Name: Regina Perry MRN: [id number] DOB: 05-27-1960   Cancelled Treatment:    Reason Eval/Treat Not Completed: Medical issues which prohibited therapy. Per critical care note form this morning the pt is transitioning to full comfort measures. PT will sign off at this time.   Bernardino JINNY Ruth 04-10-2024, 7:49 AM

## 2024-03-28 NOTE — Progress Notes (Signed)
 Patient transported to MRI and back to 4N20. Pt. Spa02 100% RR 18

## 2024-03-28 NOTE — Progress Notes (Signed)
 Noted cahnge to comfort care, agree with this decision. Neurology is available for any questions.   Aisha Seals, MD [facility]   If 7pm- 7am, please page neurology on call as listed in AMION.

## 2024-03-28 DEATH — deceased

## 2024-04-01 ENCOUNTER — Other Ambulatory Visit: Payer: Self-pay | Admitting: Cardiology

## 2024-04-01 DIAGNOSIS — I2510 Atherosclerotic heart disease of native coronary artery without angina pectoris: Secondary | ICD-10-CM

## 2024-04-01 DIAGNOSIS — E782 Mixed hyperlipidemia: Secondary | ICD-10-CM

## 2024-04-01 DIAGNOSIS — I6523 Occlusion and stenosis of bilateral carotid arteries: Secondary | ICD-10-CM

## 2024-04-10 ENCOUNTER — Other Ambulatory Visit: Payer: Self-pay | Admitting: Cardiology

## 2024-04-18 IMAGING — MR MR BREAST BILAT WO/W CM
8 of 12 series · 32 of 48 positions shown · IV contrast (6 ml gadavist)
Comparison: Previous exam(s).

CLINICAL DATA: Patient for short-term follow-up of prior MRI
demonstrating a small enhancing focus within the right nipple near
the skin surface. Patient with history of bloody nipple discharge.

EXAM:
BILATERAL BREAST MRI WITH AND WITHOUT CONTRAST
TECHNIQUE: Multiplanar, multisequence MR images of both breasts were obtained
prior to and following the intravenous administration of 6 ml of
Gadavist

[Series 2: t2_tirm_tra ipat (a-p) · axial · 3.0mm · 0.66mm/px · 1 of 55 slices shown]
[im 1/55]
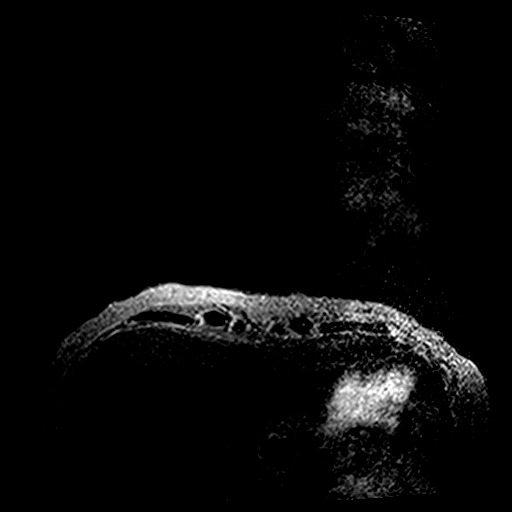

[Series 3: fl3d pre-cm no · axial · non-contrast · 1.2mm · 0.89mm/px · z∈[-77,+94]mm · 5 of 144 slices shown]
[im 1/144]
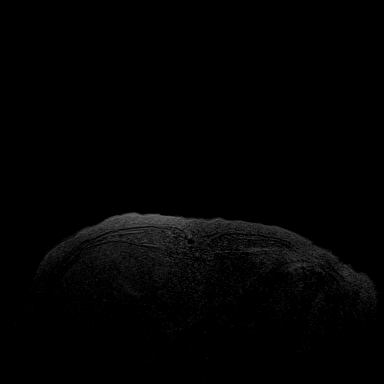
[im 36/144]
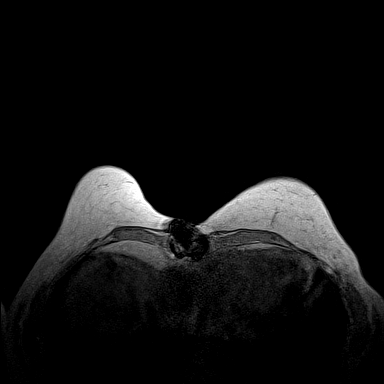
[im 72/144]
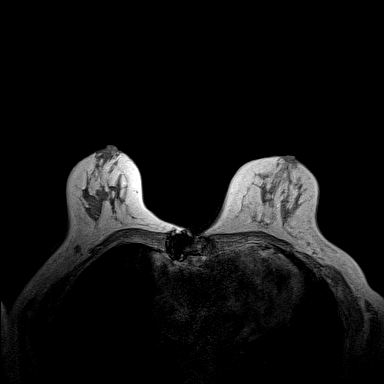
[im 108/144]
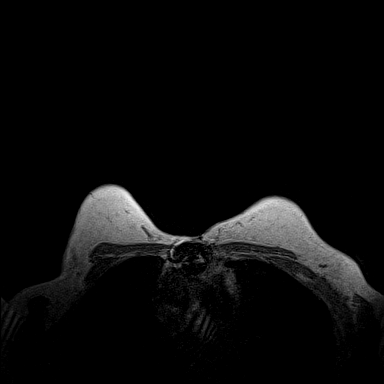
[im 144/144]
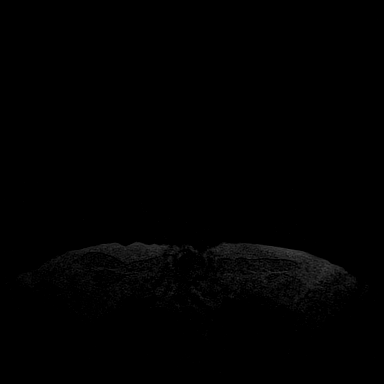

[Series 4: fl3d pre-cm · axial · non-contrast · 1.2mm · 0.89mm/px · z∈[-77,+94]mm · 5 of 144 slices shown]
[im 1/144]
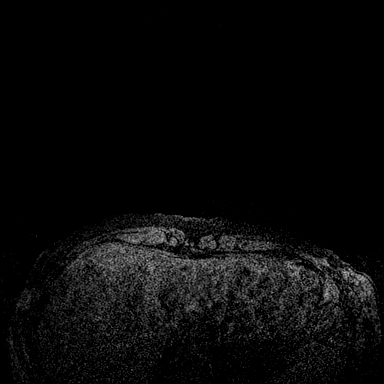
[im 36/144]
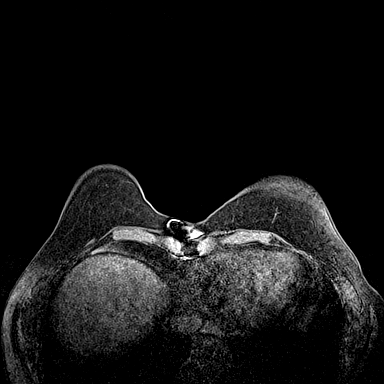
[im 72/144]
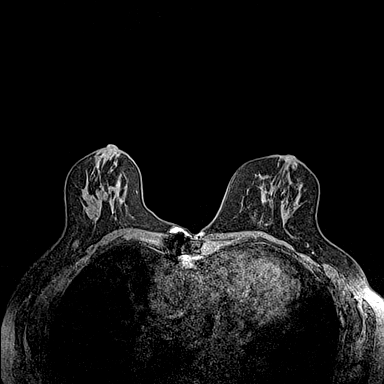
[im 108/144]
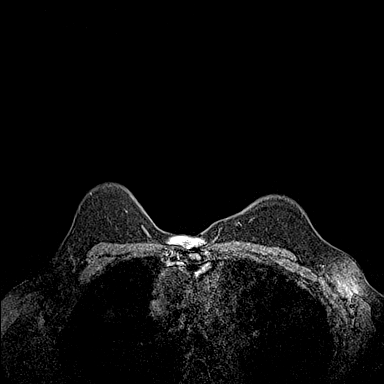
[im 144/144]
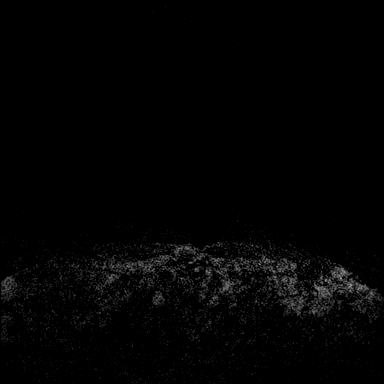

[Series 5: fl3d post-cm 20 · axial · 1.2mm · 0.89mm/px · z∈[-77,+94]mm · 5 of 144 slices shown (1 of 3)]
[im 1/144]
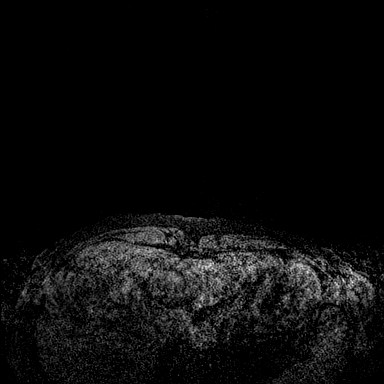
[im 36/144]
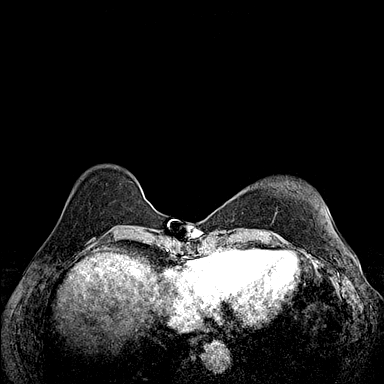
[im 72/144]
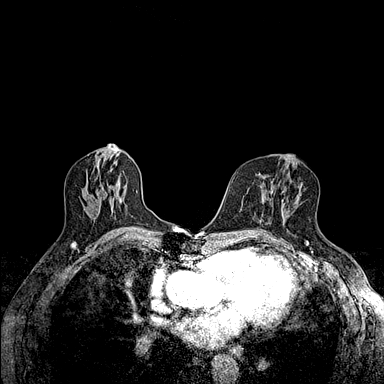
[im 108/144]
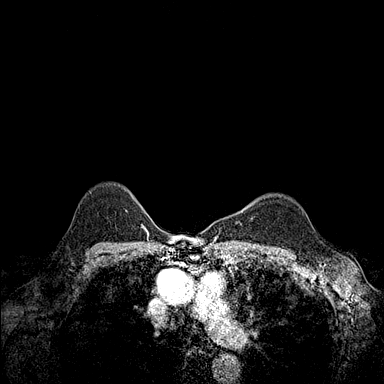
[im 144/144]
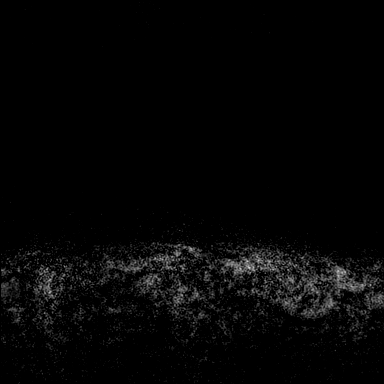

[Series 6: fl3d post-cm 20 · axial · 1.2mm · 0.89mm/px · z∈[-77,+94]mm · 5 of 144 slices shown (2 of 3)]
[im 1/144]
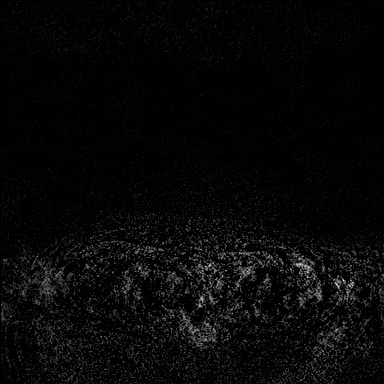
[im 36/144]
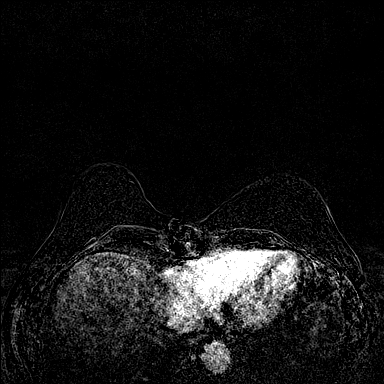
[im 72/144]
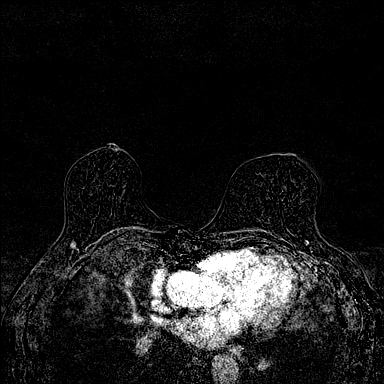
[im 108/144]
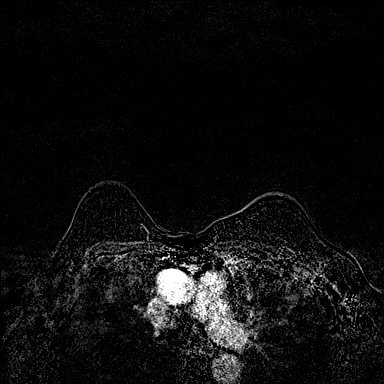
[im 144/144]
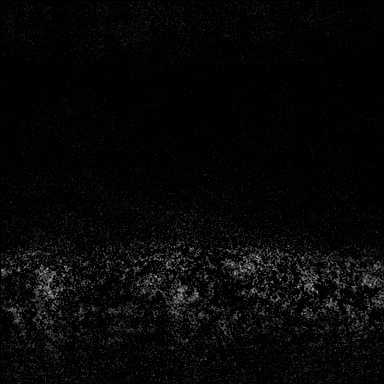

[Series 7: fl3d post-cm 20 · axial · 172.8mm · 0.89mm/px · 1 of 1 slices shown (3 of 3)]
[im 1/1]
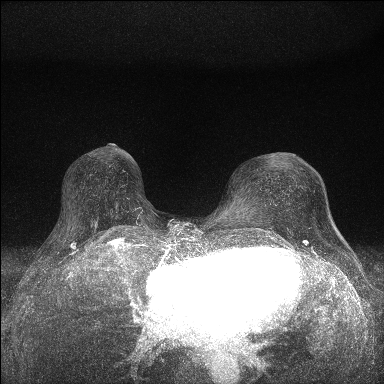

[Series 8: fl3d post-cm 3 · axial · 1.2mm · 0.89mm/px · z∈[-77,+94]mm · 6 of 144 slices shown (1 of 2)]
[im 1/144]
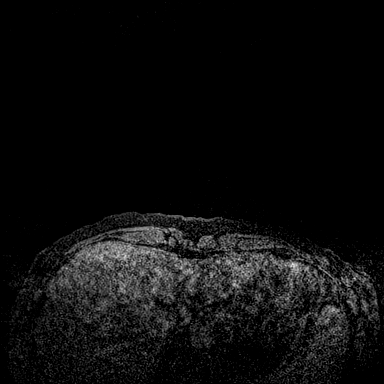
[im 29/144]
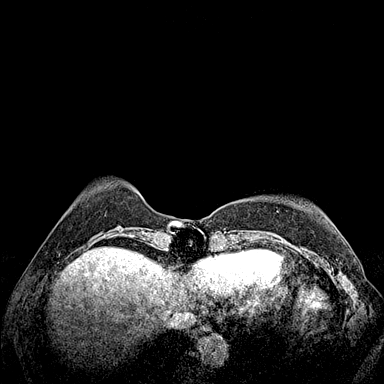
[im 58/144]
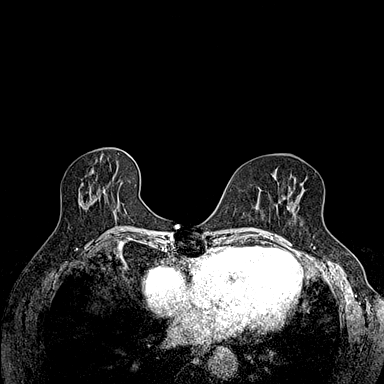
[im 86/144]
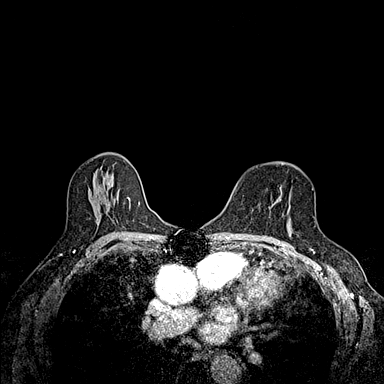
[im 115/144]
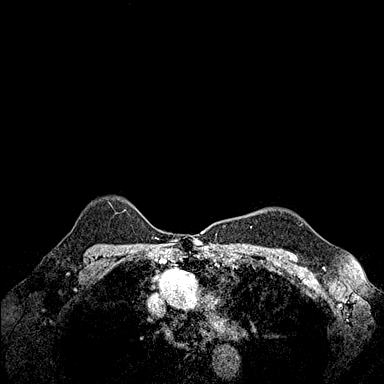
[im 144/144]
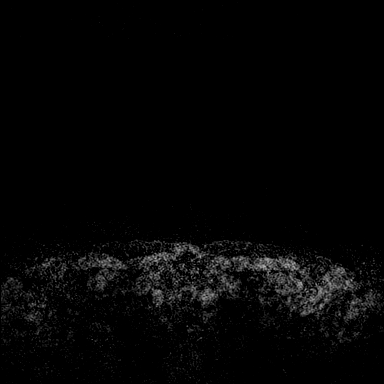

[Series 9: fl3d post-cm 3 · axial · 1.2mm · 0.89mm/px · z∈[-77,+25]mm · 4 of 144 slices shown (2 of 2)]
[im 1/144]
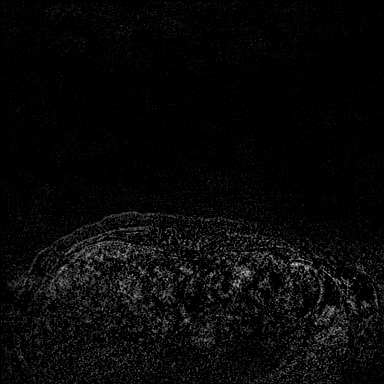
[im 29/144]
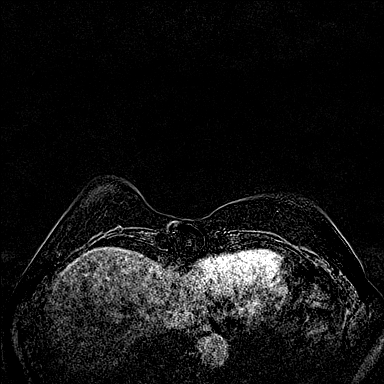
[im 58/144]
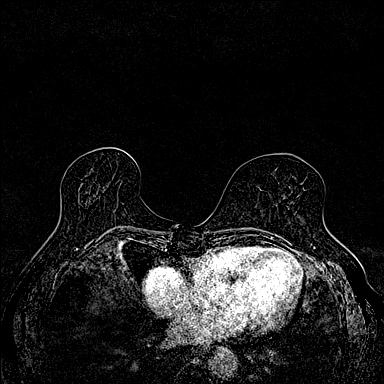
[im 86/144]
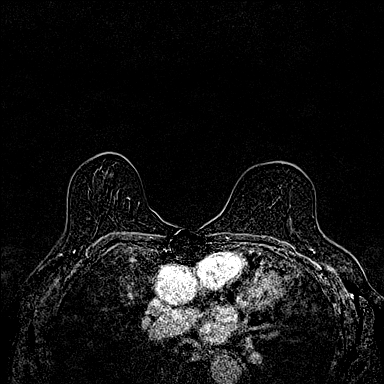

[32 of 48 positions shown; findings below may reference images not displayed]

Three-dimensional MR images were rendered by post-processing of the
original MR data on an independent workstation. The
three-dimensional MR images were interpreted, and findings are
reported in the following complete MRI report for this study. Three
dimensional images were evaluated at the independent interpreting
workstation using the [employer] thin client.
FINDINGS: Breast composition: c. Heterogeneous fibroglandular tissue.

Background parenchymal enhancement: Minimal

Right breast: There is a persistent focally dilated duct which
extends from the right nipple posteriorly into the retroareolar soft
tissues (image 28; series 2). Post contrast-enhanced images
demonstrate enhancement of the right nipple and the peripheral
aspect of the duct extending into the immediate retroareolar soft
tissues, somewhat more pronounced than on prior exam. No additional
areas of enhancement identified in the right breast.

Left breast: No mass or abnormal enhancement.

Lymph nodes: No abnormal appearing lymph nodes.

Ancillary findings:  None.
IMPRESSION: There is a dilated duct involving the right nipple and retroareolar
soft tissues with surrounding enhancement, somewhat more pronounced
than on prior exam.

RECOMMENDATION:
Second-look ultrasound of the right nipple areolar complex to
evaluate the dilated duct extending into the right nipple and
retroareolar soft tissues. If there is an intraductal mass or
dilated duct within the retroareolar soft tissues which is amicable
to biopsy, this should be considered. If there is no target for
biopsy, given the persistent abnormal enhancement of the right
nipple areolar complex, recommend surgical consultation for
consideration of wedge biopsy of the medial aspect of the right
nipple.

BI-RADS CATEGORY  4: Suspicious.

## 2024-06-19 ENCOUNTER — Ambulatory Visit

## 2024-11-27 ENCOUNTER — Ambulatory Visit: Admitting: Physician Assistant
